# Patient Record
Sex: Female | Born: 1950 | Race: White | Hispanic: No | Smoking: Current every day smoker
Health system: Southern US, Community
[De-identification: ages and names within clinical notes are randomized; demographics above are authoritative.]

## PROBLEM LIST (undated history)

## (undated) DIAGNOSIS — E119 Type 2 diabetes mellitus without complications: Secondary | ICD-10-CM

## (undated) DIAGNOSIS — F419 Anxiety disorder, unspecified: Secondary | ICD-10-CM

## (undated) DIAGNOSIS — F329 Major depressive disorder, single episode, unspecified: Secondary | ICD-10-CM

## (undated) DIAGNOSIS — J449 Chronic obstructive pulmonary disease, unspecified: Secondary | ICD-10-CM

## (undated) DIAGNOSIS — F32A Depression, unspecified: Secondary | ICD-10-CM

## (undated) DIAGNOSIS — B029 Zoster without complications: Secondary | ICD-10-CM

## (undated) DIAGNOSIS — C569 Malignant neoplasm of unspecified ovary: Secondary | ICD-10-CM

## (undated) DIAGNOSIS — E785 Hyperlipidemia, unspecified: Secondary | ICD-10-CM

## (undated) DIAGNOSIS — K219 Gastro-esophageal reflux disease without esophagitis: Secondary | ICD-10-CM

## (undated) DIAGNOSIS — G629 Polyneuropathy, unspecified: Secondary | ICD-10-CM

## (undated) DIAGNOSIS — E079 Disorder of thyroid, unspecified: Secondary | ICD-10-CM

## (undated) DIAGNOSIS — E039 Hypothyroidism, unspecified: Secondary | ICD-10-CM

## (undated) DIAGNOSIS — I10 Essential (primary) hypertension: Secondary | ICD-10-CM

## (undated) DIAGNOSIS — J40 Bronchitis, not specified as acute or chronic: Secondary | ICD-10-CM

## (undated) DIAGNOSIS — R0600 Dyspnea, unspecified: Secondary | ICD-10-CM

## (undated) HISTORY — PX: CHOLECYSTECTOMY: SHX55

## (undated) HISTORY — DX: Type 2 diabetes mellitus without complications: E11.9

## (undated) HISTORY — DX: Zoster without complications: B02.9

## (undated) HISTORY — DX: Depression, unspecified: F32.A

## (undated) HISTORY — DX: Polyneuropathy, unspecified: G62.9

## (undated) HISTORY — DX: Malignant neoplasm of unspecified ovary: C56.9

## (undated) HISTORY — DX: Bronchitis, not specified as acute or chronic: J40

## (undated) HISTORY — DX: Chronic obstructive pulmonary disease, unspecified: J44.9

## (undated) HISTORY — PX: ANKLE SURGERY: SHX546

## (undated) HISTORY — DX: Major depressive disorder, single episode, unspecified: F32.9

## (undated) HISTORY — PX: TONSILLECTOMY: SHX5217

## (undated) HISTORY — PX: SMALL INTESTINE SURGERY: SHX150

## (undated) HISTORY — PX: OVARY SURGERY: SHX727

## (undated) HISTORY — PX: ABDOMINAL HYSTERECTOMY: SHX81

## (undated) HISTORY — DX: Anxiety disorder, unspecified: F41.9

---

## 2004-02-24 ENCOUNTER — Ambulatory Visit: Payer: Self-pay | Admitting: Oncology

## 2004-03-06 ENCOUNTER — Emergency Department: Payer: Self-pay | Admitting: General Practice

## 2004-03-26 ENCOUNTER — Ambulatory Visit: Payer: Self-pay | Admitting: Oncology

## 2004-05-01 ENCOUNTER — Ambulatory Visit: Payer: Self-pay | Admitting: Oncology

## 2004-05-15 ENCOUNTER — Ambulatory Visit: Payer: Self-pay | Admitting: Oncology

## 2004-05-26 ENCOUNTER — Ambulatory Visit: Payer: Self-pay | Admitting: Oncology

## 2004-06-26 ENCOUNTER — Ambulatory Visit: Payer: Self-pay | Admitting: Oncology

## 2004-07-24 ENCOUNTER — Ambulatory Visit: Payer: Self-pay | Admitting: Oncology

## 2004-08-24 ENCOUNTER — Ambulatory Visit: Payer: Self-pay | Admitting: Oncology

## 2004-09-23 ENCOUNTER — Ambulatory Visit: Payer: Self-pay | Admitting: Oncology

## 2004-10-07 ENCOUNTER — Emergency Department: Payer: Self-pay | Admitting: Internal Medicine

## 2004-10-24 ENCOUNTER — Ambulatory Visit: Payer: Self-pay | Admitting: Oncology

## 2004-12-23 ENCOUNTER — Ambulatory Visit: Payer: Self-pay | Admitting: Oncology

## 2005-01-29 ENCOUNTER — Ambulatory Visit: Payer: Self-pay | Admitting: Oncology

## 2005-02-28 ENCOUNTER — Ambulatory Visit: Payer: Self-pay | Admitting: Oncology

## 2005-03-26 ENCOUNTER — Ambulatory Visit: Payer: Self-pay | Admitting: Oncology

## 2005-04-25 ENCOUNTER — Ambulatory Visit: Payer: Self-pay | Admitting: Oncology

## 2005-06-06 ENCOUNTER — Other Ambulatory Visit: Payer: Self-pay

## 2005-06-06 ENCOUNTER — Ambulatory Visit: Payer: Self-pay | Admitting: Unknown Physician Specialty

## 2005-06-11 ENCOUNTER — Ambulatory Visit: Payer: Self-pay | Admitting: Oncology

## 2005-06-12 ENCOUNTER — Ambulatory Visit: Payer: Self-pay | Admitting: Unknown Physician Specialty

## 2005-06-19 ENCOUNTER — Ambulatory Visit: Payer: Self-pay | Admitting: Unknown Physician Specialty

## 2005-06-26 ENCOUNTER — Ambulatory Visit: Payer: Self-pay | Admitting: Oncology

## 2005-07-31 ENCOUNTER — Ambulatory Visit: Payer: Self-pay | Admitting: Oncology

## 2005-08-24 ENCOUNTER — Ambulatory Visit: Payer: Self-pay | Admitting: Oncology

## 2005-09-02 ENCOUNTER — Ambulatory Visit: Payer: Self-pay | Admitting: Internal Medicine

## 2005-12-01 ENCOUNTER — Ambulatory Visit: Payer: Self-pay | Admitting: Oncology

## 2005-12-24 ENCOUNTER — Ambulatory Visit: Payer: Self-pay | Admitting: Oncology

## 2006-01-13 ENCOUNTER — Ambulatory Visit: Payer: Self-pay | Admitting: Unknown Physician Specialty

## 2006-01-15 ENCOUNTER — Ambulatory Visit: Payer: Self-pay | Admitting: Unknown Physician Specialty

## 2006-04-08 ENCOUNTER — Ambulatory Visit: Payer: Self-pay | Admitting: Oncology

## 2006-04-10 ENCOUNTER — Ambulatory Visit: Payer: Self-pay | Admitting: Oncology

## 2006-04-25 ENCOUNTER — Ambulatory Visit: Payer: Self-pay | Admitting: Oncology

## 2006-07-25 ENCOUNTER — Ambulatory Visit: Payer: Self-pay | Admitting: Oncology

## 2006-08-10 ENCOUNTER — Ambulatory Visit: Payer: Self-pay | Admitting: Oncology

## 2006-08-25 ENCOUNTER — Ambulatory Visit: Payer: Self-pay | Admitting: Oncology

## 2006-10-07 ENCOUNTER — Ambulatory Visit: Payer: Self-pay | Admitting: Gastroenterology

## 2006-11-16 ENCOUNTER — Ambulatory Visit: Payer: Self-pay | Admitting: Internal Medicine

## 2006-11-24 ENCOUNTER — Ambulatory Visit: Payer: Self-pay | Admitting: Oncology

## 2006-12-07 ENCOUNTER — Ambulatory Visit: Payer: Self-pay | Admitting: Oncology

## 2006-12-25 ENCOUNTER — Ambulatory Visit: Payer: Self-pay | Admitting: Oncology

## 2007-04-26 ENCOUNTER — Ambulatory Visit: Payer: Self-pay | Admitting: Oncology

## 2007-05-06 ENCOUNTER — Ambulatory Visit: Payer: Self-pay | Admitting: Oncology

## 2007-05-27 ENCOUNTER — Ambulatory Visit: Payer: Self-pay | Admitting: Oncology

## 2007-11-16 ENCOUNTER — Ambulatory Visit: Payer: Self-pay | Admitting: Oncology

## 2007-11-17 ENCOUNTER — Ambulatory Visit: Payer: Self-pay | Admitting: Oncology

## 2007-11-24 ENCOUNTER — Ambulatory Visit: Payer: Self-pay | Admitting: Oncology

## 2008-01-31 ENCOUNTER — Emergency Department: Payer: Self-pay | Admitting: Emergency Medicine

## 2008-04-25 ENCOUNTER — Ambulatory Visit: Payer: Self-pay | Admitting: Oncology

## 2008-05-11 ENCOUNTER — Ambulatory Visit: Payer: Self-pay | Admitting: Oncology

## 2008-05-26 ENCOUNTER — Ambulatory Visit: Payer: Self-pay | Admitting: Oncology

## 2008-10-24 ENCOUNTER — Ambulatory Visit: Payer: Self-pay | Admitting: Oncology

## 2008-11-08 ENCOUNTER — Ambulatory Visit: Payer: Self-pay | Admitting: Oncology

## 2008-11-23 ENCOUNTER — Ambulatory Visit: Payer: Self-pay | Admitting: Oncology

## 2009-01-16 ENCOUNTER — Ambulatory Visit: Payer: Self-pay | Admitting: Oncology

## 2009-04-25 ENCOUNTER — Ambulatory Visit: Payer: Self-pay | Admitting: Oncology

## 2009-05-14 ENCOUNTER — Ambulatory Visit: Payer: Self-pay | Admitting: Oncology

## 2009-05-26 ENCOUNTER — Ambulatory Visit: Payer: Self-pay | Admitting: Oncology

## 2009-11-21 ENCOUNTER — Ambulatory Visit: Payer: Self-pay | Admitting: Oncology

## 2009-11-23 ENCOUNTER — Ambulatory Visit: Payer: Self-pay | Admitting: Oncology

## 2010-05-15 ENCOUNTER — Ambulatory Visit: Payer: Self-pay | Admitting: Oncology

## 2010-05-23 ENCOUNTER — Ambulatory Visit: Payer: Self-pay | Admitting: Oncology

## 2010-05-25 LAB — CA 125: CA 125: 17.3 U/mL (ref 0.0–34.0)

## 2010-05-26 ENCOUNTER — Ambulatory Visit: Payer: Self-pay | Admitting: Oncology

## 2010-09-10 ENCOUNTER — Inpatient Hospital Stay: Payer: Self-pay | Admitting: Internal Medicine

## 2010-09-30 ENCOUNTER — Emergency Department: Payer: Self-pay | Admitting: Emergency Medicine

## 2010-12-03 ENCOUNTER — Ambulatory Visit: Payer: Self-pay | Admitting: Oncology

## 2010-12-25 ENCOUNTER — Ambulatory Visit: Payer: Self-pay | Admitting: Oncology

## 2011-03-11 ENCOUNTER — Inpatient Hospital Stay: Payer: Self-pay | Admitting: Surgery

## 2011-03-14 LAB — PATHOLOGY REPORT

## 2011-12-09 ENCOUNTER — Ambulatory Visit: Payer: Self-pay | Admitting: Oncology

## 2011-12-09 LAB — CBC CANCER CENTER
Basophil #: 0.1 x10 3/mm (ref 0.0–0.1)
Eosinophil #: 0.3 x10 3/mm (ref 0.0–0.7)
Eosinophil %: 2.9 %
HCT: 42.4 % (ref 35.0–47.0)
Lymphocyte #: 2.3 x10 3/mm (ref 1.0–3.6)
Lymphocyte %: 20.9 %
MCV: 99 fL (ref 80–100)
Monocyte #: 0.5 x10 3/mm (ref 0.2–0.9)
Monocyte %: 5 %
Neutrophil %: 70.3 %
Platelet: 240 x10 3/mm (ref 150–440)
RBC: 4.29 10*6/uL (ref 3.80–5.20)
WBC: 10.9 x10 3/mm (ref 3.6–11.0)

## 2011-12-09 LAB — COMPREHENSIVE METABOLIC PANEL
Albumin: 3.6 g/dL (ref 3.4–5.0)
Anion Gap: 14 (ref 7–16)
Calcium, Total: 9.1 mg/dL (ref 8.5–10.1)
Chloride: 97 mmol/L — ABNORMAL LOW (ref 98–107)
Co2: 28 mmol/L (ref 21–32)
EGFR (African American): 56 — ABNORMAL LOW
EGFR (Non-African Amer.): 48 — ABNORMAL LOW
Glucose: 145 mg/dL — ABNORMAL HIGH (ref 65–99)
Potassium: 3.7 mmol/L (ref 3.5–5.1)
SGOT(AST): 32 U/L (ref 15–37)
SGPT (ALT): 27 U/L

## 2011-12-25 ENCOUNTER — Ambulatory Visit: Payer: Self-pay | Admitting: Oncology

## 2012-01-11 ENCOUNTER — Emergency Department: Payer: Self-pay | Admitting: *Deleted

## 2012-01-11 LAB — CBC
HCT: 41 % (ref 35.0–47.0)
HGB: 13.5 g/dL (ref 12.0–16.0)
MCH: 32 pg (ref 26.0–34.0)
MCV: 97 fL (ref 80–100)
RBC: 4.22 10*6/uL (ref 3.80–5.20)
WBC: 12 10*3/uL — ABNORMAL HIGH (ref 3.6–11.0)

## 2012-01-11 LAB — CK TOTAL AND CKMB (NOT AT ARMC): CK-MB: 2.6 ng/mL (ref 0.5–3.6)

## 2012-01-11 LAB — BASIC METABOLIC PANEL
BUN: 17 mg/dL (ref 7–18)
Calcium, Total: 9.1 mg/dL (ref 8.5–10.1)
Chloride: 99 mmol/L (ref 98–107)
Co2: 32 mmol/L (ref 21–32)
Glucose: 148 mg/dL — ABNORMAL HIGH (ref 65–99)

## 2012-01-11 LAB — TROPONIN I: Troponin-I: <0.02 ng/mL

## 2012-07-28 ENCOUNTER — Ambulatory Visit: Payer: Self-pay | Admitting: Internal Medicine

## 2012-11-12 IMAGING — CR DG CHEST 2V
1 series · 2 of 2 positions shown · non-contrast
Comparison: none

REASON FOR EXAM: chest pain
COMMENTS:

[Series 1: w chest pa · 0.14mm/px · 2 of 2 slices shown]
[im 1/2]
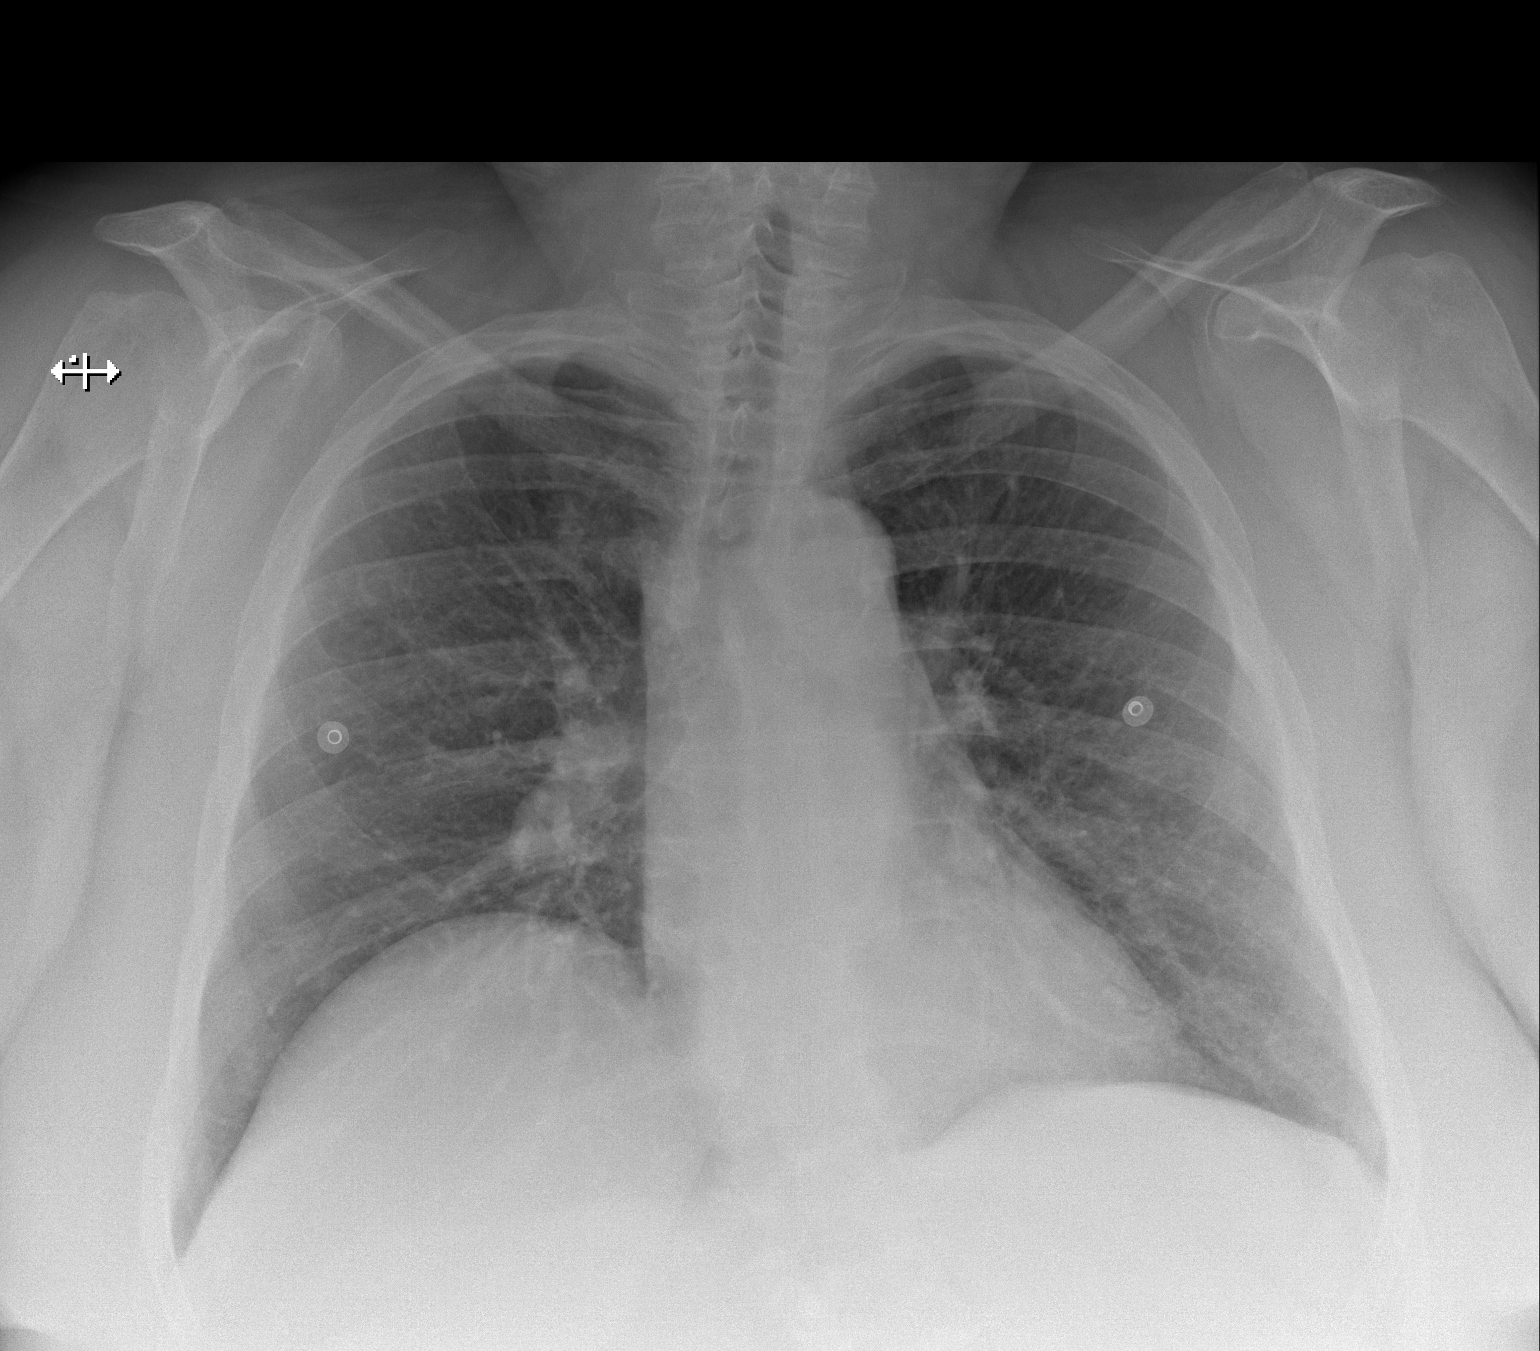
[im 2/2]
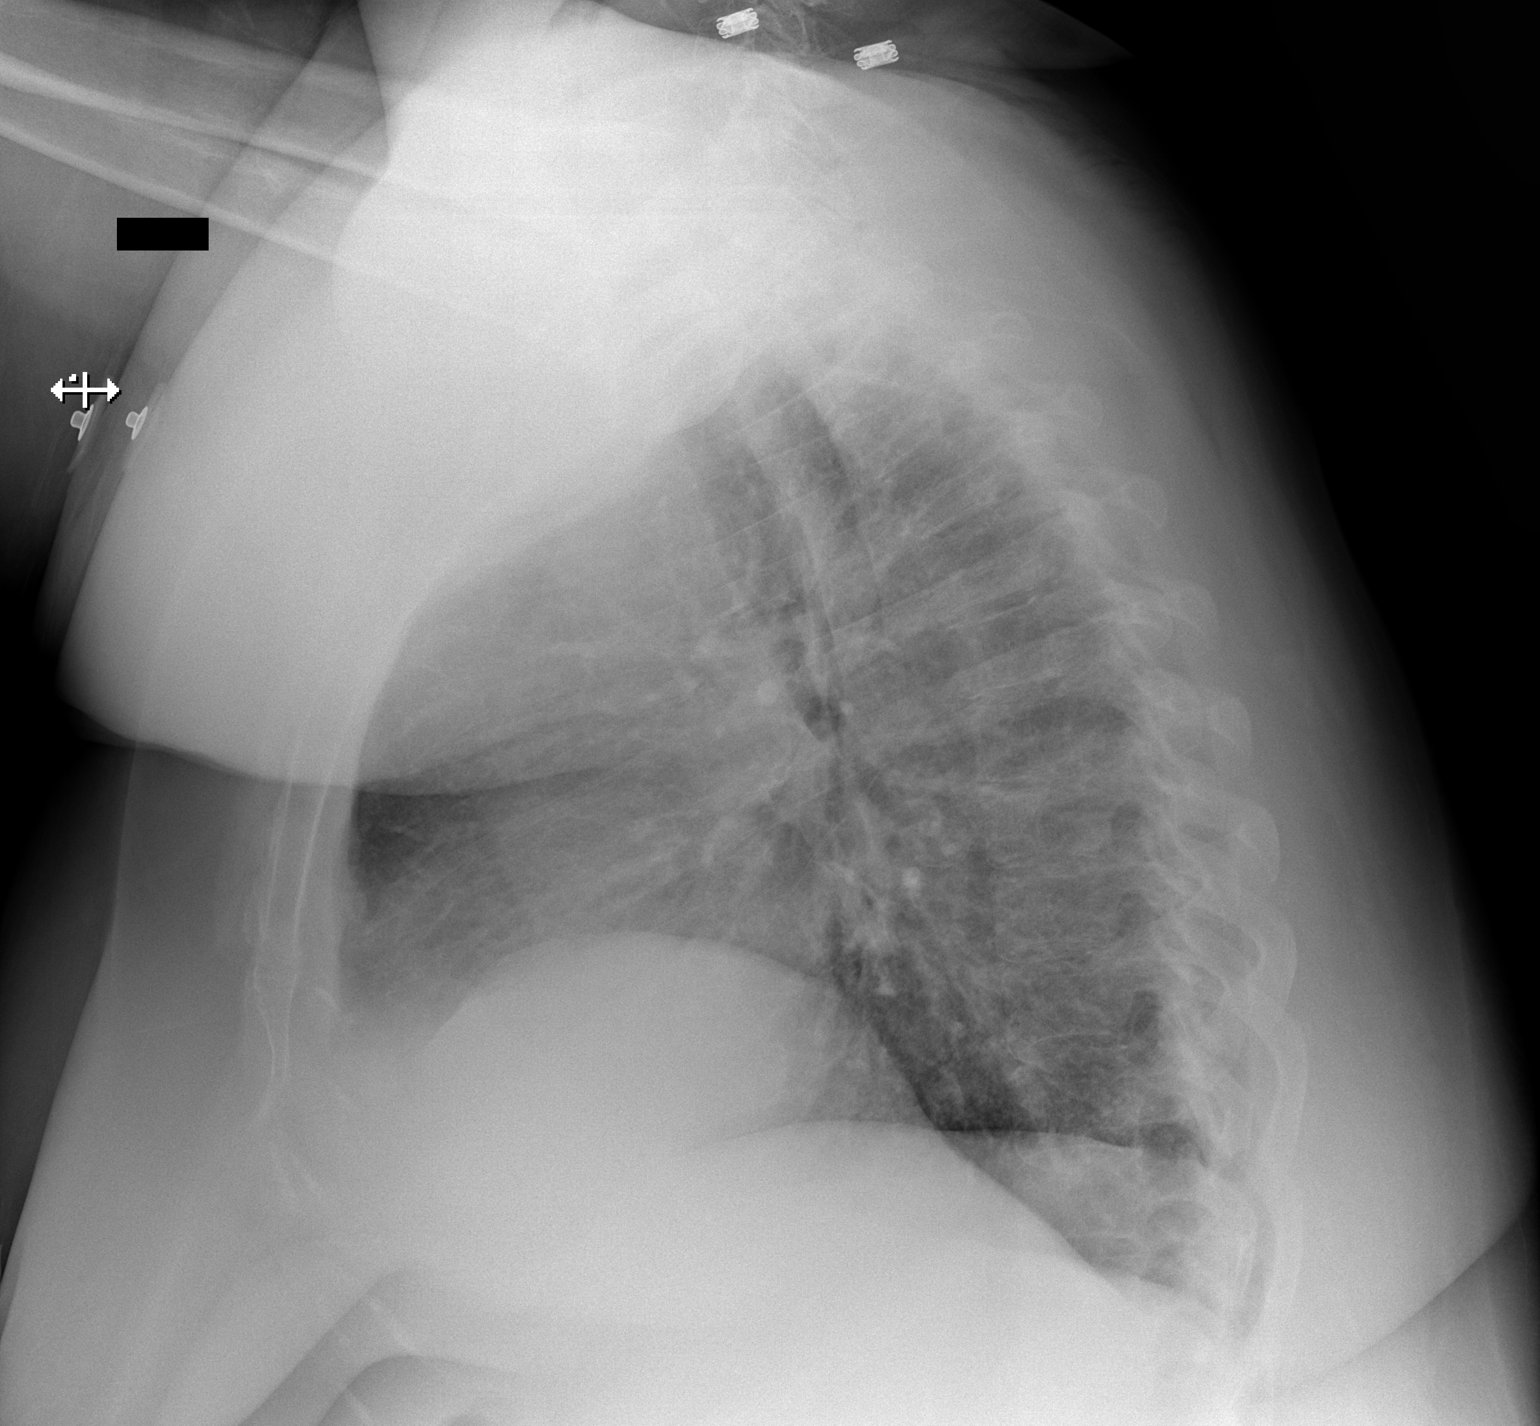

[2 of 2 positions shown; findings below may reference images not displayed]

PROCEDURE:     DXR - DXR CHEST PA (OR AP) AND LATERAL  - January 11, 2012  [DATE]

RESULT:     The lungs are reasonably well inflated. There is no focal
infiltrate. The cardiac silhouette is normal in size. The pulmonary
vascularity is not engorged. There is no pleural effusion. The mediastinum
is normal in width. The observed portions of the bony thorax exhibit no
acute abnormality.
IMPRESSION: There is no evidence of acute cardiopulmonary abnormality.

[REDACTED]

## 2014-02-26 ENCOUNTER — Emergency Department: Payer: Self-pay | Admitting: Emergency Medicine

## 2014-02-26 LAB — COMPREHENSIVE METABOLIC PANEL
ALBUMIN: 3.5 g/dL (ref 3.4–5.0)
ALK PHOS: 84 U/L
ALT: 26 U/L
ANION GAP: 6 — AB (ref 7–16)
AST: 32 U/L (ref 15–37)
BUN: 13 mg/dL (ref 7–18)
Bilirubin,Total: 0.5 mg/dL (ref 0.2–1.0)
CREATININE: 0.9 mg/dL (ref 0.60–1.30)
Calcium, Total: 9 mg/dL (ref 8.5–10.1)
Chloride: 100 mmol/L (ref 98–107)
Co2: 32 mmol/L (ref 21–32)
EGFR (African American): >60
EGFR (Non-African Amer.): >60
Glucose: 150 mg/dL — ABNORMAL HIGH (ref 65–99)
Osmolality: 279 (ref 275–301)
POTASSIUM: 4.1 mmol/L (ref 3.5–5.1)
SODIUM: 138 mmol/L (ref 136–145)
Total Protein: 8.1 g/dL (ref 6.4–8.2)

## 2014-02-26 LAB — CBC
HCT: 42.8 % (ref 35.0–47.0)
HGB: 13.8 g/dL (ref 12.0–16.0)
MCH: 31.6 pg (ref 26.0–34.0)
MCHC: 32.2 g/dL (ref 32.0–36.0)
MCV: 98 fL (ref 80–100)
PLATELETS: 269 10*3/uL (ref 150–440)
RBC: 4.35 10*6/uL (ref 3.80–5.20)
RDW: 14 % (ref 11.5–14.5)
WBC: 10.7 10*3/uL (ref 3.6–11.0)

## 2014-03-01 ENCOUNTER — Encounter: Payer: Self-pay | Admitting: Surgery

## 2014-03-03 LAB — CULTURE, BLOOD (SINGLE)

## 2014-11-16 ENCOUNTER — Ambulatory Visit: Payer: PPO | Admitting: Psychiatry

## 2014-11-28 ENCOUNTER — Other Ambulatory Visit: Payer: Self-pay

## 2014-11-28 ENCOUNTER — Other Ambulatory Visit: Payer: Self-pay | Admitting: Psychiatry

## 2014-11-28 ENCOUNTER — Ambulatory Visit: Payer: PPO | Admitting: Psychiatry

## 2014-11-28 DIAGNOSIS — Z87898 Personal history of other specified conditions: Secondary | ICD-10-CM | POA: Insufficient documentation

## 2014-11-28 DIAGNOSIS — F411 Generalized anxiety disorder: Secondary | ICD-10-CM | POA: Insufficient documentation

## 2014-11-28 DIAGNOSIS — Z8679 Personal history of other diseases of the circulatory system: Secondary | ICD-10-CM | POA: Insufficient documentation

## 2014-11-28 DIAGNOSIS — Z8639 Personal history of other endocrine, nutritional and metabolic disease: Secondary | ICD-10-CM | POA: Insufficient documentation

## 2014-11-28 DIAGNOSIS — G629 Polyneuropathy, unspecified: Secondary | ICD-10-CM | POA: Insufficient documentation

## 2014-11-28 DIAGNOSIS — F331 Major depressive disorder, recurrent, moderate: Secondary | ICD-10-CM | POA: Insufficient documentation

## 2014-11-28 DIAGNOSIS — J449 Chronic obstructive pulmonary disease, unspecified: Secondary | ICD-10-CM | POA: Insufficient documentation

## 2014-11-28 MED ORDER — ALPRAZOLAM 0.5 MG PO TABS: 0.5000 mg | ORAL_TABLET | Freq: Three times a day (TID) | ORAL | Status: DC | PRN

## 2014-11-28 MED ORDER — LAMOTRIGINE 25 MG PO TABS: 25.0000 mg | ORAL_TABLET | Freq: Every day | ORAL | Status: DC

## 2014-11-28 MED ORDER — ALPRAZOLAM 0.5 MG PO TABS
0.5000 mg | ORAL_TABLET | Freq: Every evening | ORAL | Status: DC | PRN
Start: 2014-11-28 — End: 2014-11-28

## 2014-11-28 MED ORDER — PAROXETINE HCL 20 MG PO TABS: ORAL_TABLET | ORAL | Status: DC

## 2014-12-19 ENCOUNTER — Encounter: Payer: Self-pay | Admitting: Psychiatry

## 2014-12-19 ENCOUNTER — Ambulatory Visit (INDEPENDENT_AMBULATORY_CARE_PROVIDER_SITE_OTHER): Payer: PPO | Admitting: Psychiatry

## 2014-12-19 VITALS — BP 162/98 | HR 107 | Temp 98.8°F | Ht 63.0 in

## 2014-12-19 DIAGNOSIS — F411 Generalized anxiety disorder: Secondary | ICD-10-CM | POA: Diagnosis not present

## 2014-12-19 DIAGNOSIS — F3130 Bipolar disorder, current episode depressed, mild or moderate severity, unspecified: Secondary | ICD-10-CM | POA: Diagnosis not present

## 2014-12-19 MED ORDER — ALPRAZOLAM 0.5 MG PO TABS: 0.5000 mg | ORAL_TABLET | Freq: Three times a day (TID) | ORAL | Status: DC | PRN

## 2014-12-19 MED ORDER — PAROXETINE HCL 20 MG PO TABS: ORAL_TABLET | ORAL | Status: DC

## 2014-12-19 NOTE — Progress Notes (Signed)
BH MD/PA/NP OP Progress Note  12/19/2014 11:10 AM Glenda Boone  MRN:  [id number]  Subjective:  Patient is a morbidly obese female who presented for the follow-up appointment. She reported that she was unable to keep her appointment as her grandson and did not bring her on time. She was very apologetic for the same. She reported that she was very sad as her cat off 12 years passed away in 18-Nov-2022. She reported that her grandchildren are now trying to keep her chair for by bringing her and her daily. Patient reported that she also fell last month and is recuperating from the same. Patient reported that she is tired of being obese and does not want to walk anymore as she feels depressed as most of the time. She reported that she has been following with her primary care physician and is going to discuss with them about the option of having a gastric sleeve. She is compliant with taking Paxil and Xanax at this time. She denied having any suicidal ideations or plans.   Chief Complaint:  Chief Complaint    Follow-up; Anxiety; Depression     Visit Diagnosis:     ICD-9-CM ICD-10-CM   1. Bipolar I disorder, most recent episode (or current) unspecified 296.7 F31.9   2. Generalized anxiety disorder 300.02 F41.1     Past Medical History: No past medical history on file. No past surgical history on file. Family History: No family history on file. Social History:  History   Social History  . Marital Status: Widowed    Spouse Name: N/A  . Number of Children: N/A  . Years of Education: N/A   Social History Main Topics  . Smoking status: Not on file  . Smokeless tobacco: Not on file  . Alcohol Use: Not on file  . Drug Use: Not on file  . Sexual Activity: Not on file   Other Topics Concern  . Not on file   Social History Narrative  . No narrative on file   Additional History:   She currently lives with her family members.    Assessment:   Musculoskeletal: Strength & Muscle Tone:  decreased Gait & Station: unsteady, walking slowly  Patient leans: N/A  Psychiatric Specialty Exam: HPI  Review of Systems  Cardiovascular: Positive for orthopnea and PND.  Musculoskeletal: Positive for back pain and neck pain.  Neurological: Positive for dizziness and weakness.  Psychiatric/Behavioral: The patient is nervous/anxious.   All other systems reviewed and are negative.   There were no vitals taken for this visit.There is no height or weight on file to calculate BMI.  General Appearance: Casual  Eye Contact:  Fair  Speech:  Slow  Volume:  Decreased  Mood:  Anxious and Depressed  Affect:  Congruent  Thought Process:  Coherent  Orientation:  Full (Time, Place, and Person)  Thought Content:  WDL  Suicidal Thoughts:  No  Homicidal Thoughts:  No  Memory:  NA  Judgement:  Fair  Insight:  Fair  Psychomotor Activity:  Normal  Concentration:  Fair  Recall:  [employer] of Knowledge: Fair  Language: Fair  Akathisia:  No  Handed:  Right  AIMS (if indicated):    Assets:  Communication Skills Desire for Improvement Social Support  ADL's:  Intact  Cognition: WNL  Sleep:     Is the patient at risk to self?  No. Has the patient been a risk to self in the past 6 months?  No. Has the patient  been a risk to self within the distant past?  No. Is the patient a risk to others?  No. Has the patient been a risk to others in the past 6 months?  No. Has the patient been a risk to others within the distant past?  No.  Current Medications: Current Outpatient Prescriptions  Medication Sig Dispense Refill  . ALPRAZolam (XANAX) 0.5 MG tablet Take 1 tablet (0.5 mg total) by mouth 3 (three) times daily as needed for anxiety. 90 tablet 0  . atorvastatin (LIPITOR) 40 MG tablet Take by mouth.    . hydrochlorothiazide (MICROZIDE) 12.5 MG capsule Take by mouth.    . lamoTRIgine (LAMICTAL) 25 MG tablet Take by mouth.    . lamoTRIgine (LAMICTAL) 25 MG tablet Take 1 tablet (25 mg total) by  mouth daily. 30 tablet 0  . levothyroxine (SYNTHROID) 100 MCG tablet Take by mouth.    . metFORMIN (GLUCOPHAGE) 500 MG tablet Take by mouth.    Marland Kitchen PARoxetine (PAXIL) 20 MG tablet 3 pill daily 90 tablet 0  . potassium chloride (K-DUR) 10 MEQ tablet Take by mouth.    . simvastatin (ZOCOR) 40 MG tablet Take by mouth.    . torsemide (DEMADEX) 10 MG tablet Take by mouth.     No current facility-administered medications for this visit.    Medical Decision Making:  Review of Psycho-Social Stressors (1) and Review and summation of old records (2)  Treatment Plan Summary:Medication management   Discussed with patient what the medications treatment risks benefits and alternatives. Encouraged patient to seek help about her obesity and to get gastric sleeve done and she demonstrated understanding.  e will continue on Paxil 60 mg by mouth daily.  She also takes Xanax 3 times a day when necessary.  She will follow-up in 4 months or earlier    More than 50% of the time spent in psychoeducation, counseling and coordination of care.    This note was generated in part or whole with voice recognition software. Voice regonition is usually quite accurate but there are transcription errors that can and very often do occur. I apologize for any typographical errors that were not detected and corrected.   Rainey Pines 12/19/2014, 11:10 AM

## 2015-04-09 ENCOUNTER — Ambulatory Visit: Payer: Self-pay | Admitting: Psychiatry

## 2015-04-24 ENCOUNTER — Ambulatory Visit: Payer: Self-pay | Admitting: Psychiatry

## 2015-04-29 ENCOUNTER — Other Ambulatory Visit: Payer: Self-pay | Admitting: Psychiatry

## 2015-05-02 ENCOUNTER — Telehealth: Payer: Self-pay | Admitting: Psychiatry

## 2015-05-03 ENCOUNTER — Ambulatory Visit: Payer: PPO | Admitting: Psychiatry

## 2015-05-31 MED ORDER — ALPRAZOLAM 0.5 MG PO TABS: 0.5000 mg | ORAL_TABLET | Freq: Three times a day (TID) | ORAL | Status: DC | PRN

## 2015-05-31 MED ORDER — PAROXETINE HCL 20 MG PO TABS: 20.0000 mg | ORAL_TABLET | Freq: Three times a day (TID) | ORAL | Status: DC

## 2015-05-31 NOTE — Telephone Encounter (Signed)
pt needs medication refill on paxil and xanax

## 2015-05-31 NOTE — Telephone Encounter (Signed)
rx faxed - confirm for xanax .5mg  take 1 tablet by mouth 3 times daily as needed.  id # [id number] order [id number]

## 2015-05-31 NOTE — Telephone Encounter (Signed)
Pt continues to cancel her appointments and asking for medication refills. Advised her that this will be the last refill before she will be dismissed from the practice, if she will not be able to continue with her appointments. She demonstrated understanding.

## 2015-06-21 ENCOUNTER — Ambulatory Visit (INDEPENDENT_AMBULATORY_CARE_PROVIDER_SITE_OTHER): Payer: PPO | Admitting: Psychiatry

## 2015-06-21 ENCOUNTER — Encounter: Payer: Self-pay | Admitting: Psychiatry

## 2015-06-21 VITALS — BP 142/98 | HR 84 | Temp 97.6°F | Ht 63.0 in

## 2015-06-21 DIAGNOSIS — J418 Mixed simple and mucopurulent chronic bronchitis: Secondary | ICD-10-CM

## 2015-06-21 DIAGNOSIS — F3130 Bipolar disorder, current episode depressed, mild or moderate severity, unspecified: Secondary | ICD-10-CM

## 2015-06-21 DIAGNOSIS — B029 Zoster without complications: Secondary | ICD-10-CM | POA: Insufficient documentation

## 2015-06-21 DIAGNOSIS — F411 Generalized anxiety disorder: Secondary | ICD-10-CM

## 2015-06-21 DIAGNOSIS — J42 Unspecified chronic bronchitis: Secondary | ICD-10-CM | POA: Insufficient documentation

## 2015-06-21 HISTORY — DX: Zoster without complications: B02.9

## 2015-06-21 MED ORDER — ALPRAZOLAM 0.5 MG PO TABS: 0.5000 mg | ORAL_TABLET | Freq: Two times a day (BID) | ORAL | Status: DC | PRN

## 2015-06-21 MED ORDER — PAROXETINE HCL 20 MG PO TABS: 20.0000 mg | ORAL_TABLET | Freq: Three times a day (TID) | ORAL | Status: DC

## 2015-06-21 NOTE — Progress Notes (Signed)
[place] MD/PA/NP OP Progress Note  06/21/2015 10:38 AM Glenda Boone  MRN:  [id number]  Subjective:  Patient is a morbidly obese female who presented for the follow-up appointment. She was last seen in July. Patient reported that she was sick as she was suffering from chronic bronchitis and had shingles. She was apologetic for her missing appointments. Patient reported that she is doing better now. She was discussing in detail about her issues with her family members as well as with her grandsons. She reported that she is compliant with her medications. She takes Xanax on a when necessary basis and is usually taking on a twice-daily basis. She also takes Paxil 60 mg in the morning. She reported that she is doing better on her current doses of the medications. She is currently stable on her medications and denied having any more swings anger anxiety or paranoia. She is moderately obese and is walking with the help of walker. She denied having any suicidal ideations or plans. She continues to smoke on a regular basis.    Chief Complaint:  Chief Complaint    Follow-up; Medication Refill     Visit Diagnosis:     ICD-9-CM ICD-10-CM   1. MDD, moderate 296.50 F31.30   2. Generalized anxiety disorder 300.02 F41.1   3. Shingles outbreak 053.9 B02.9   4. Mixed simple and mucopurulent chronic bronchitis (HCC) 491.1 J41.8     Past Medical History:  Past Medical History  Diagnosis Date  . Ovarian cancer ([place])   . Anxiety   . Depression   . Diabetes mellitus, type II ([place])   . COPD (chronic obstructive pulmonary disease) ([place])   . Neuropathy ([place])   . Shingles   . Bronchitis     Past Surgical History  Procedure Laterality Date  . Abdominal hysterectomy    . Ovary surgery    . Cholecystectomy    . Tonsillectomy     Family History:  Family History  Problem Relation Age of Onset  . Heart Problems Mother   . Hypertension Mother   . Colon cancer Father   . Arthritis-Osteo Sister    Social  History:  Social History   Social History  . Marital Status: Widowed    Spouse Name: N/A  . Number of Children: N/A  . Years of Education: N/A   Social History Main Topics  . Smoking status: Current Every Day Smoker -- 1.00 packs/day    Types: Cigarettes    Start date: 12/19/1979  . Smokeless tobacco: Never Used  . Alcohol Use: No  . Drug Use: No  . Sexual Activity: No   Other Topics Concern  . None   Social History Narrative   Additional History:   She currently lives by herself. Family members help her.     Assessment:   Musculoskeletal: Strength & Muscle Tone: decreased Gait & Station: unsteady, walking slowly  Patient leans: N/A  Psychiatric Specialty Exam: HPI   Review of Systems  Constitutional: Positive for malaise/fatigue.  Cardiovascular: Positive for leg swelling.  Gastrointestinal: Positive for diarrhea.  Musculoskeletal: Positive for back pain, joint pain and neck pain.  Neurological: Positive for dizziness and weakness.  Psychiatric/Behavioral: Positive for depression. The patient is nervous/anxious.   All other systems reviewed and are negative.   Blood pressure 142/98, pulse 84, temperature 97.6 F (36.4 C), temperature source Tympanic, height 5\' 3"  (1.6 m), SpO2 94 %.There is no weight on file to calculate BMI.  General Appearance: Casual  Eye Contact:  Fair  Speech:  Slow  Volume:  Decreased  Mood:  Euthymic  Affect:  Congruent  Thought Process:  Coherent  Orientation:  Full (Time, Place, and Person)  Thought Content:  WDL  Suicidal Thoughts:  No  Homicidal Thoughts:  No  Memory:  NA  Judgement:  Fair  Insight:  Fair  Psychomotor Activity:  Normal  Concentration:  Fair  Recall:  [employer] of Knowledge: Fair  Language: Fair  Akathisia:  No  Handed:  Right  AIMS (if indicated):    Assets:  Communication Skills Desire for Improvement Social Support  ADL's:  Intact  Cognition: WNL  Sleep:     Is the patient at risk to self?   No. Has the patient been a risk to self in the past 6 months?  No. Has the patient been a risk to self within the distant past?  No. Is the patient a risk to others?  No. Has the patient been a risk to others in the past 6 months?  No. Has the patient been a risk to others within the distant past?  No.  Current Medications: Current Outpatient Prescriptions  Medication Sig Dispense Refill  . ALPRAZolam (XANAX) 0.5 MG tablet Take 1 tablet (0.5 mg total) by mouth 3 (three) times daily as needed for anxiety. 90 tablet 0  . atorvastatin (LIPITOR) 40 MG tablet Take by mouth.    . hydrochlorothiazide (MICROZIDE) 12.5 MG capsule Take by mouth.    . levothyroxine (SYNTHROID) 100 MCG tablet Take by mouth.    Marland Kitchen lisinopril (PRINIVIL,ZESTRIL) 20 MG tablet Take 20 mg by mouth.    . metFORMIN (GLUCOPHAGE) 500 MG tablet Take by mouth.    Marland Kitchen PARoxetine (PAXIL) 20 MG tablet Take 1 tablet (20 mg total) by mouth 3 (three) times daily. 90 tablet 0  . potassium chloride (K-DUR) 10 MEQ tablet Take by mouth.    . simvastatin (ZOCOR) 40 MG tablet Take by mouth.    . torsemide (DEMADEX) 10 MG tablet Take by mouth.    . triamcinolone (KENALOG) 0.025 % cream Reported on 06/21/2015     No current facility-administered medications for this visit.    Medical Decision Making:  Review of Psycho-Social Stressors (1) and Review and summation of old records (2)  Treatment Plan Summary:Medication management   Discussed with patient what the medications treatment risks benefits and alternatives. Encouraged patient to seek help about her obesity and to get gastric sleeve done and she demonstrated understanding.  She will continue on Paxil 60 mg by mouth daily.- 90 day supply of the medications given  She also takes Xanax .05 mg BID when necessary. - 3 month supply of the medications given She will follow-up in 3 months or earlier    More than 50% of the time spent in psychoeducation, counseling and coordination of care.     This note was generated in part or whole with voice recognition software. Voice regonition is usually quite accurate but there are transcription errors that can and very often do occur. I apologize for any typographical errors that were not detected and corrected.   Rainey Pines, MD    06/21/2015, 10:38 AM

## 2015-08-28 DIAGNOSIS — L989 Disorder of the skin and subcutaneous tissue, unspecified: Secondary | ICD-10-CM | POA: Diagnosis not present

## 2015-08-28 DIAGNOSIS — Z79899 Other long term (current) drug therapy: Secondary | ICD-10-CM | POA: Diagnosis not present

## 2015-08-28 DIAGNOSIS — E039 Hypothyroidism, unspecified: Secondary | ICD-10-CM | POA: Diagnosis not present

## 2015-08-28 DIAGNOSIS — Z1211 Encounter for screening for malignant neoplasm of colon: Secondary | ICD-10-CM | POA: Diagnosis not present

## 2015-08-28 DIAGNOSIS — R7309 Other abnormal glucose: Secondary | ICD-10-CM | POA: Diagnosis not present

## 2015-08-28 DIAGNOSIS — L309 Dermatitis, unspecified: Secondary | ICD-10-CM | POA: Diagnosis not present

## 2015-08-28 DIAGNOSIS — J449 Chronic obstructive pulmonary disease, unspecified: Secondary | ICD-10-CM | POA: Diagnosis not present

## 2015-08-28 DIAGNOSIS — I10 Essential (primary) hypertension: Secondary | ICD-10-CM | POA: Diagnosis not present

## 2015-08-28 DIAGNOSIS — E119 Type 2 diabetes mellitus without complications: Secondary | ICD-10-CM | POA: Diagnosis not present

## 2015-08-28 DIAGNOSIS — F17200 Nicotine dependence, unspecified, uncomplicated: Secondary | ICD-10-CM | POA: Diagnosis not present

## 2015-08-29 DIAGNOSIS — Z1211 Encounter for screening for malignant neoplasm of colon: Secondary | ICD-10-CM | POA: Diagnosis not present

## 2015-09-05 ENCOUNTER — Other Ambulatory Visit: Payer: Self-pay | Admitting: Family Medicine

## 2015-09-05 DIAGNOSIS — Z1231 Encounter for screening mammogram for malignant neoplasm of breast: Secondary | ICD-10-CM

## 2015-09-18 ENCOUNTER — Ambulatory Visit: Payer: PPO | Admitting: Psychiatry

## 2015-09-24 ENCOUNTER — Encounter: Payer: Self-pay | Admitting: Psychiatry

## 2015-09-24 ENCOUNTER — Ambulatory Visit (INDEPENDENT_AMBULATORY_CARE_PROVIDER_SITE_OTHER): Payer: PPO | Admitting: Psychiatry

## 2015-09-24 VITALS — BP 142/78 | HR 90 | Temp 97.7°F

## 2015-09-24 DIAGNOSIS — F411 Generalized anxiety disorder: Secondary | ICD-10-CM | POA: Diagnosis not present

## 2015-09-24 DIAGNOSIS — F3130 Bipolar disorder, current episode depressed, mild or moderate severity, unspecified: Secondary | ICD-10-CM

## 2015-09-24 MED ORDER — ALPRAZOLAM 0.5 MG PO TABS: 0.5000 mg | ORAL_TABLET | Freq: Two times a day (BID) | ORAL | Status: DC | PRN

## 2015-09-24 MED ORDER — PAROXETINE HCL 20 MG PO TABS: 20.0000 mg | ORAL_TABLET | Freq: Three times a day (TID) | ORAL | Status: DC

## 2015-09-24 NOTE — Progress Notes (Signed)
BH MD/PA/NP OP Progress Note  09/24/2015 12:14 PM Glenda Boone  MRN:  [id number]  Subjective:  Patient is a morbidly obese 65 yo  female who presented for the follow-up appointment.  Patient reported that she had her colonoscopy done last week. She reported that she also felt in her house and was taken to the hospital. She was noted to be talking nonstop during the interview and was discussing her issues at this time. She reported that she is unhappy as she is getting a monthly check for her SSI. She has to ration her income. She is being supported by her family as well as her sister. Her sister brought her for this appointment. Patient reported that she takes Paxil and Xanax on a regular basis. She currently denied having any symptoms at this time. She denied having any suicidal homicidal ideations or plans. She continues to smoke on a regular basis.    Chief Complaint:  Chief Complaint    Follow-up; Medication Refill     Visit Diagnosis:     ICD-9-CM ICD-10-CM   1. MDD, moderate 296.50 F31.30   2. Generalized anxiety disorder 300.02 F41.1     Past Medical History:  Past Medical History  Diagnosis Date  . Ovarian cancer ([place])   . Anxiety   . Depression   . Diabetes mellitus, type II ([place])   . COPD (chronic obstructive pulmonary disease) ([place])   . Neuropathy ([place])   . Shingles   . Bronchitis     Past Surgical History  Procedure Laterality Date  . Abdominal hysterectomy    . Ovary surgery    . Cholecystectomy    . Tonsillectomy     Family History:  Family History  Problem Relation Age of Onset  . Heart Problems Mother   . Hypertension Mother   . Colon cancer Father   . Arthritis-Osteo Sister    Social History:  Social History   Social History  . Marital Status: Widowed    Spouse Name: N/A  . Number of Children: N/A  . Years of Education: N/A   Social History Main Topics  . Smoking status: Current Every Day Smoker -- 1.00 packs/day    Types: Cigarettes   Start date: 12/19/1979  . Smokeless tobacco: Never Used  . Alcohol Use: No  . Drug Use: No  . Sexual Activity: No   Other Topics Concern  . None   Social History Narrative   Additional History:   She currently lives by herself. Family members help her.     Assessment:   Musculoskeletal: Strength & Muscle Tone: decreased Gait & Station: unsteady, walking slowly  Patient leans: N/A  Psychiatric Specialty Exam: HPI  Review of Systems  Constitutional: Positive for malaise/fatigue.  Cardiovascular: Positive for leg swelling.  Gastrointestinal: Positive for diarrhea.  Musculoskeletal: Positive for back pain, joint pain and neck pain.  Neurological: Positive for dizziness and weakness.  Psychiatric/Behavioral: Positive for depression. The patient is nervous/anxious.   All other systems reviewed and are negative.   Blood pressure 142/78, pulse 90, temperature 97.7 F (36.5 C), temperature source Tympanic, SpO2 95 %.There is no weight on file to calculate BMI.  General Appearance: Casual  Eye Contact:  Fair  Speech:  Slow  Volume:  Decreased  Mood:  Euthymic  Affect:  Congruent  Thought Process:  Coherent  Orientation:  Full (Time, Place, and Person)  Thought Content:  WDL  Suicidal Thoughts:  No  Homicidal Thoughts:  No  Memory:  NA  Judgement:  Fair  Insight:  Fair  Psychomotor Activity:  Normal  Concentration:  Fair  Recall:  [employer] of Knowledge: Fair  Language: Fair  Akathisia:  No  Handed:  Right  AIMS (if indicated):    Assets:  Communication Skills Desire for Improvement Social Support  ADL's:  Intact  Cognition: WNL  Sleep:     Is the patient at risk to self?  No. Has the patient been a risk to self in the past 6 months?  No. Has the patient been a risk to self within the distant past?  No. Is the patient a risk to others?  No. Has the patient been a risk to others in the past 6 months?  No. Has the patient been a risk to others within the  distant past?  No.  Current Medications: Current Outpatient Prescriptions  Medication Sig Dispense Refill  . ALPRAZolam (XANAX) 0.5 MG tablet Take 1 tablet (0.5 mg total) by mouth 2 (two) times daily as needed for anxiety. 60 tablet 2  . atorvastatin (LIPITOR) 40 MG tablet Take by mouth.    . hydrochlorothiazide (MICROZIDE) 12.5 MG capsule Take by mouth.    . levothyroxine (SYNTHROID) 100 MCG tablet Take by mouth.    Marland Kitchen lisinopril (PRINIVIL,ZESTRIL) 20 MG tablet Take 20 mg by mouth.    . metFORMIN (GLUCOPHAGE) 500 MG tablet Take by mouth.    Marland Kitchen PARoxetine (PAXIL) 20 MG tablet Take 1 tablet (20 mg total) by mouth 3 (three) times daily. 270 tablet 2  . potassium chloride (K-DUR) 10 MEQ tablet Take by mouth.    . simvastatin (ZOCOR) 40 MG tablet Take by mouth.    . torsemide (DEMADEX) 10 MG tablet Take by mouth.    . triamcinolone (KENALOG) 0.025 % cream Reported on 06/21/2015     No current facility-administered medications for this visit.    Medical Decision Making:  Review of Psycho-Social Stressors (1) and Review and summation of old records (2)  Treatment Plan Summary:Medication management   Discussed with patient what the medications treatment risks benefits and alternatives.   She will continue on Paxil 60 mg by mouth daily.- 90 day supply of the medications given  She also takes Xanax 0.5 mg BID when necessary. - 3 month supply of the medications given She will follow-up in 3 months or earlier    More than 50% of the time spent in psychoeducation, counseling and coordination of care.    This note was generated in part or whole with voice recognition software. Voice regonition is usually quite accurate but there are transcription errors that can and very often do occur. I apologize for any typographical errors that were not detected and corrected.   Rainey Pines, MD    09/24/2015, 12:14 PM

## 2015-12-25 ENCOUNTER — Ambulatory Visit: Payer: PPO | Admitting: Psychiatry

## 2015-12-31 ENCOUNTER — Encounter: Payer: Self-pay | Admitting: Psychiatry

## 2015-12-31 ENCOUNTER — Ambulatory Visit (INDEPENDENT_AMBULATORY_CARE_PROVIDER_SITE_OTHER): Payer: PPO | Admitting: Psychiatry

## 2015-12-31 VITALS — BP 169/99 | HR 82 | Temp 98.3°F

## 2015-12-31 DIAGNOSIS — F411 Generalized anxiety disorder: Secondary | ICD-10-CM

## 2015-12-31 DIAGNOSIS — F3130 Bipolar disorder, current episode depressed, mild or moderate severity, unspecified: Secondary | ICD-10-CM

## 2015-12-31 MED ORDER — ALPRAZOLAM 0.5 MG PO TABS: 0.5000 mg | ORAL_TABLET | Freq: Two times a day (BID) | ORAL | 2 refills | Status: DC | PRN

## 2015-12-31 MED ORDER — PAROXETINE HCL 20 MG PO TABS: 60.0000 mg | ORAL_TABLET | Freq: Every day | ORAL | 2 refills | Status: DC

## 2015-12-31 NOTE — Progress Notes (Signed)
BH MD/PA/NP OP Progress Note  12/31/2015 12:13 PM Glenda Boone  MRN:  [id number]  Subjective:  Patient is a morbidly obese 65 yo  female who presented for the follow-up appointment.  Patient was breathless and unable to walk. She reported that she had stopped her "water pill",  She was noted to be talking nonstop during the interview and was discussing her issues. She reported that [facility] is [facility] issues [facility] as their [facility] to [facility] from her. Patient reported that she is also planning to find a scooter for herself. She is unable to walk for long distances. She appeared disheveled as usual and reported that she has been having issues due to her morbid obesity. She has support from her sister who brought her for the appointment. Patient reported that she takes Paxil in the morning and makes her tired and we discussed about changing it to bedtime. She is also taking Xanax on a twice-daily basis.  Patient currently denied having any suicidal homicidal ideations or plans. She denied having any perceptual disturbances.   Chief Complaint:  Chief Complaint    Follow-up; Medication Refill     Visit Diagnosis:     ICD-9-CM ICD-10-CM   1. MDD, moderate 296.50 F31.30   2. Generalized anxiety disorder 300.02 F41.1     Past Medical History:  Past Medical History:  Diagnosis Date  . Anxiety   . Bronchitis   . COPD (chronic obstructive pulmonary disease) ([place])   . Depression   . Diabetes mellitus, type II ([place])   . Neuropathy ([place])   . Ovarian cancer ([place])   . Shingles     Past Surgical History:  Procedure Laterality Date  . ABDOMINAL HYSTERECTOMY    . CHOLECYSTECTOMY    . OVARY SURGERY    . TONSILLECTOMY     Family History:  Family History  Problem Relation Age of Onset  . Heart Problems Mother   . Hypertension Mother   . Colon cancer Father   . Arthritis-Osteo Sister    Social History:  Social History   Social History  . Marital status: Widowed    Spouse name:  N/A  . Number of children: N/A  . Years of education: N/A   Social History Main Topics  . Smoking status: Current Every Day Smoker    Packs/day: 1.00    Types: Cigarettes    Start date: 12/19/1979  . Smokeless tobacco: Never Used  . Alcohol use No  . Drug use: No  . Sexual activity: No   Other Topics Concern  . None   Social History Narrative  . None   Additional History:   She currently lives by herself. Family members help her.     Assessment:   Musculoskeletal: Strength & Muscle Tone: decreased Gait & Station: unsteady, walking slowly  Patient leans: N/A  Psychiatric Specialty Exam: Medication Refill  Associated symptoms include neck pain and weakness.    Review of Systems  Constitutional: Positive for malaise/fatigue.  Cardiovascular: Positive for leg swelling.  Gastrointestinal: Positive for diarrhea.  Musculoskeletal: Positive for back pain, joint pain and neck pain.  Neurological: Positive for dizziness and weakness.  Psychiatric/Behavioral: Positive for depression. The patient is nervous/anxious.   All other systems reviewed and are negative.   Blood pressure (!) 169/99, pulse 82, temperature 98.3 F (36.8 C), temperature source Oral.There is no height or weight on file to calculate BMI.  General Appearance: Casual  Eye Contact:  Fair  Speech:  Clear  and Coherent  Volume:  Normal  Mood:  Euthymic  Affect:  Congruent  Thought Process:  Coherent  Orientation:  Full (Time, Place, and Person)  Thought Content:  WDL  Suicidal Thoughts:  No  Homicidal Thoughts:  No  Memory:  NA  Judgement:  Fair  Insight:  Fair  Psychomotor Activity:  Normal  Concentration:  Fair  Recall:  [employer] of Knowledge: Fair  Language: Fair  Akathisia:  No  Handed:  Right  AIMS (if indicated):    Assets:  Communication Skills Desire for Improvement Social Support  ADL's:  Intact  Cognition: WNL  Sleep:     Is the patient at risk to self?  No. Has the patient  been a risk to self in the past 6 months?  No. Has the patient been a risk to self within the distant past?  No. Is the patient a risk to others?  No. Has the patient been a risk to others in the past 6 months?  No. Has the patient been a risk to others within the distant past?  No.  Current Medications: Current Outpatient Prescriptions  Medication Sig Dispense Refill  . ALPRAZolam (XANAX) 0.5 MG tablet Take 1 tablet (0.5 mg total) by mouth 2 (two) times daily as needed for anxiety. 60 tablet 2  . atorvastatin (LIPITOR) 40 MG tablet Take by mouth.    . hydrochlorothiazide (MICROZIDE) 12.5 MG capsule Take by mouth.    . levothyroxine (SYNTHROID) 100 MCG tablet Take by mouth.    Marland Kitchen lisinopril (PRINIVIL,ZESTRIL) 20 MG tablet Take 20 mg by mouth.    . metFORMIN (GLUCOPHAGE) 500 MG tablet Take by mouth.    Marland Kitchen PARoxetine (PAXIL) 20 MG tablet Take 1 tablet (20 mg total) by mouth 3 (three) times daily. 270 tablet 2  . potassium chloride (K-DUR) 10 MEQ tablet Take by mouth.    . simvastatin (ZOCOR) 40 MG tablet Take by mouth.    . torsemide (DEMADEX) 10 MG tablet Take by mouth.    . triamcinolone (KENALOG) 0.025 % cream Reported on 06/21/2015     No current facility-administered medications for this visit.     Medical Decision Making:  Review of Psycho-Social Stressors (1) and Review and summation of old records (2)  Treatment Plan Summary:Medication management   Discussed with patient what the medications treatment risks benefits and alternatives.   She will continue on Paxil 60 mg by mouth daily.- 90 day supply of the medications given  She also takes Xanax 0.5 mg BID when necessary. - 3 month supply of the medications given She will follow-up in 3 months or earlier    More than 50% of the time spent in psychoeducation, counseling and coordination of care.    This note was generated in part or whole with voice recognition software. Voice regonition is usually quite accurate but there are  transcription errors that can and very often do occur. I apologize for any typographical errors that were not detected and corrected.   Rainey Pines, MD    12/31/2015, 12:13 PM

## 2016-04-01 ENCOUNTER — Telehealth: Payer: Self-pay | Admitting: *Deleted

## 2016-04-01 DIAGNOSIS — F411 Generalized anxiety disorder: Secondary | ICD-10-CM

## 2016-04-01 DIAGNOSIS — F331 Major depressive disorder, recurrent, moderate: Secondary | ICD-10-CM

## 2016-04-01 NOTE — Telephone Encounter (Signed)
Pt called requesting refills or enough doses of her Paxil and Alprazolam to make it to the next appointment on 04/08/16. Currently has at least 3 days of both. Will forward to Dr. Gretel Acre to address upon return tomorrow

## 2016-04-02 ENCOUNTER — Ambulatory Visit: Payer: PPO | Admitting: Psychiatry

## 2016-04-03 MED ORDER — ALPRAZOLAM 0.5 MG PO TABS: 0.5000 mg | ORAL_TABLET | Freq: Two times a day (BID) | ORAL | 0 refills | Status: DC | PRN

## 2016-04-03 MED ORDER — PAROXETINE HCL 20 MG PO TABS: 60.0000 mg | ORAL_TABLET | Freq: Every day | ORAL | 0 refills | Status: DC

## 2016-04-03 NOTE — Telephone Encounter (Signed)
Medication refill - Called in one time refill order for patient's Alprazolam with [name], pharmacist at [facility] on [address] and e-scribed in approved one time refill order for patient's Paxil per approval by Dr. Gretel Acre.  Called patient and left a message to inform both orders were sent to her pharmacy and patient to call back if any problems filling orders.  Reminded of appointment for 04/08/16 on message left.

## 2016-04-03 NOTE — Telephone Encounter (Signed)
Refill- one month supply. Thanks

## 2016-04-03 NOTE — Telephone Encounter (Signed)
Medication refill - Patient left another message she is now out of Paxil and will also be running out of Xanax prior to appointment scheduled 04/08/16.  Requests new orders be called in to last until appointment to her [place].  Patient last provided orders 12/31/15 plus 2 refills for both.

## 2016-04-08 ENCOUNTER — Encounter: Payer: Self-pay | Admitting: Psychiatry

## 2016-04-08 ENCOUNTER — Ambulatory Visit (INDEPENDENT_AMBULATORY_CARE_PROVIDER_SITE_OTHER): Payer: PPO | Admitting: Psychiatry

## 2016-04-08 VITALS — BP 152/93 | HR 83 | Temp 97.6°F | Resp 18

## 2016-04-08 DIAGNOSIS — F331 Major depressive disorder, recurrent, moderate: Secondary | ICD-10-CM

## 2016-04-08 DIAGNOSIS — F411 Generalized anxiety disorder: Secondary | ICD-10-CM

## 2016-04-08 MED ORDER — ALPRAZOLAM 0.5 MG PO TABS: 0.5000 mg | ORAL_TABLET | Freq: Two times a day (BID) | ORAL | 2 refills | Status: DC | PRN

## 2016-04-08 MED ORDER — PAROXETINE HCL 20 MG PO TABS: 60.0000 mg | ORAL_TABLET | Freq: Every day | ORAL | 2 refills | Status: DC
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
## 2016-06-16 ENCOUNTER — Telehealth: Payer: Self-pay

## 2016-06-16 NOTE — Telephone Encounter (Signed)
Thank you :)
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
## 2016-06-16 NOTE — Telephone Encounter (Signed)
called pharamcy they states they would note reason why patient wants medication earily  and go ahead an refill rx early.

## 2016-07-01 DIAGNOSIS — R32 Unspecified urinary incontinence: Secondary | ICD-10-CM | POA: Diagnosis not present

## 2016-07-01 DIAGNOSIS — E039 Hypothyroidism, unspecified: Secondary | ICD-10-CM | POA: Diagnosis not present

## 2016-07-01 DIAGNOSIS — R7309 Other abnormal glucose: Secondary | ICD-10-CM | POA: Diagnosis not present

## 2016-07-01 DIAGNOSIS — Z1159 Encounter for screening for other viral diseases: Secondary | ICD-10-CM | POA: Diagnosis not present

## 2016-07-01 DIAGNOSIS — Z79899 Other long term (current) drug therapy: Secondary | ICD-10-CM | POA: Diagnosis not present

## 2016-07-01 DIAGNOSIS — E119 Type 2 diabetes mellitus without complications: Secondary | ICD-10-CM | POA: Diagnosis not present

## 2016-07-01 DIAGNOSIS — F17200 Nicotine dependence, unspecified, uncomplicated: Secondary | ICD-10-CM | POA: Diagnosis not present

## 2016-07-01 DIAGNOSIS — I10 Essential (primary) hypertension: Secondary | ICD-10-CM | POA: Diagnosis not present

## 2016-07-09 ENCOUNTER — Ambulatory Visit (INDEPENDENT_AMBULATORY_CARE_PROVIDER_SITE_OTHER): Payer: PPO | Admitting: Psychiatry

## 2016-07-09 ENCOUNTER — Encounter: Payer: Self-pay | Admitting: Psychiatry

## 2016-07-09 VITALS — BP 168/109 | HR 85 | Temp 98.3°F

## 2016-07-09 DIAGNOSIS — F411 Generalized anxiety disorder: Secondary | ICD-10-CM

## 2016-07-09 DIAGNOSIS — F331 Major depressive disorder, recurrent, moderate: Secondary | ICD-10-CM

## 2016-07-09 MED ORDER — PAROXETINE HCL 20 MG PO TABS: 60.0000 mg | ORAL_TABLET | Freq: Every day | ORAL | 2 refills | Status: DC

## 2016-07-09 MED ORDER — HYDROXYZINE HCL 25 MG PO TABS: 25.0000 mg | ORAL_TABLET | Freq: Two times a day (BID) | ORAL | 2 refills | Status: DC
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
## 2016-08-25 ENCOUNTER — Telehealth: Payer: Self-pay

## 2016-08-25 NOTE — Telephone Encounter (Signed)
pt called states she can not take the medication you put her on .  it is making her light headed and sleepy.  pt states she wants to go back on the xanax.

## 2016-08-27 NOTE — Telephone Encounter (Signed)
pt  called again to check on her phone message. pt state she not able to take the medication you put her on and that she wants to go back on the xanax.

## 2016-08-28 NOTE — Telephone Encounter (Signed)
pt called again wanted to know what she needed to do. was dr. Gretel Acre going to put her back on xanax or call in something else for her to try.  pt was told that dr. Gretel Acre didn't make any statment and that i would have to forward message to  office to see if a doctor there would answer message.    Pt was last seen on  07-09-16 next appt 10-06-16

## 2016-08-29 NOTE — Telephone Encounter (Signed)
[place] office wanted me to see if you would advise on this patient.

## 2016-08-29 NOTE — Telephone Encounter (Signed)
Message was left on voice mail to call our office back.

## 2016-08-29 NOTE — Telephone Encounter (Signed)
At this time if patient has been tapered off the Xanax in February she will need to follow up with Dr. Gretel Acre came in May.

## 2016-08-29 NOTE — Telephone Encounter (Signed)
received a fax this morning where pt called and reached the answering services.  according to message received from answering service, pt stated that she is out of xanax and that she was taken off of it but the medication that dr. Gretel Acre put her on is not working and making her worse so she wants to go back on the xanax.    Pt was seen on  07-09-16 next appt  10-06-16

## 2016-09-26 ENCOUNTER — Telehealth: Payer: Self-pay

## 2016-09-26 NOTE — Telephone Encounter (Signed)
PT CALLED STATES SHE CAN NOT TAKE THE HYDROXYZINE.  SHE HAS STOPPED TAKING .  SHE WASNT TO GO BACK ON XANAX.  PT STATE THE ONLY THING THAT IS KEEPING HER SAIN IS THE PAXIL.

## 2016-10-06 ENCOUNTER — Ambulatory Visit: Payer: PPO | Admitting: Psychiatry

## 2016-10-06 NOTE — Telephone Encounter (Signed)
pt called again states she needs to go back on the xanax.  states she is having panic attacks and and she doesnt know what to do.

## 2016-10-13 ENCOUNTER — Ambulatory Visit (INDEPENDENT_AMBULATORY_CARE_PROVIDER_SITE_OTHER): Payer: PPO | Admitting: Psychiatry

## 2016-10-13 ENCOUNTER — Encounter: Payer: Self-pay | Admitting: Psychiatry

## 2016-10-13 VITALS — BP 160/82 | HR 84 | Temp 98.4°F

## 2016-10-13 DIAGNOSIS — F411 Generalized anxiety disorder: Secondary | ICD-10-CM

## 2016-10-13 DIAGNOSIS — F331 Major depressive disorder, recurrent, moderate: Secondary | ICD-10-CM | POA: Diagnosis not present

## 2016-10-13 MED ORDER — PAROXETINE HCL 20 MG PO TABS: 60.0000 mg | ORAL_TABLET | Freq: Every day | ORAL | 2 refills | Status: DC

## 2016-10-13 MED ORDER — GABAPENTIN 100 MG PO CAPS: 100.0000 mg | ORAL_CAPSULE | Freq: Three times a day (TID) | ORAL | 2 refills | Status: DC
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
## 2016-10-16 ENCOUNTER — Other Ambulatory Visit: Payer: Self-pay | Admitting: Family Medicine

## 2016-10-16 DIAGNOSIS — Z1231 Encounter for screening mammogram for malignant neoplasm of breast: Secondary | ICD-10-CM

## 2016-11-13 NOTE — Telephone Encounter (Signed)
pt was given an appt for 10-13-16 because Dr. Gretel Acre has not responded to any of the messages. pt very upset.

## 2016-12-22 ENCOUNTER — Ambulatory Visit
Admission: RE | Admit: 2016-12-22 | Discharge: 2016-12-22 | Disposition: A | Discharge status: Home / Self Care | Payer: PPO | Source: Ambulatory Visit | Attending: Family Medicine | Admitting: Family Medicine

## 2016-12-22 DIAGNOSIS — Z1231 Encounter for screening mammogram for malignant neoplasm of breast: Secondary | ICD-10-CM | POA: Insufficient documentation

## 2017-01-12 ENCOUNTER — Ambulatory Visit (INDEPENDENT_AMBULATORY_CARE_PROVIDER_SITE_OTHER): Payer: PPO | Admitting: Psychiatry

## 2017-01-12 DIAGNOSIS — F331 Major depressive disorder, recurrent, moderate: Secondary | ICD-10-CM

## 2017-01-12 DIAGNOSIS — F411 Generalized anxiety disorder: Secondary | ICD-10-CM | POA: Diagnosis not present

## 2017-01-12 MED ORDER — PAROXETINE HCL 20 MG PO TABS: 60.0000 mg | ORAL_TABLET | Freq: Every day | ORAL | 2 refills | Status: DC

## 2017-01-12 MED ORDER — GABAPENTIN 300 MG PO CAPS: 300.0000 mg | ORAL_CAPSULE | Freq: Every day | ORAL | 3 refills | Status: DC

## 2017-01-12 NOTE — Progress Notes (Signed)
BH MD/PA/NP OP Progress Note  01/12/2017 3:03 PM Glenda Boone  MRN:  [id number]  Subjective:  Patient is a morbidly obese 66 yo female who presented for the follow-up appointment.  Patient reported that she has started taking the Neurontin and it has been doing well for her. She reported that she is able to sleep well at night. She was talking about the pain and the neuropathy. She appeared calm and alert during the interview. No new issues noted. She was also asking about the Klonopin briefly but patient was advised that she will not be given any benzodiazepines at this time. She acknowledged.    She is in the wheelchair and cannot walk. Her sister brought her to the appointment.       Chief Complaint:   Visit Diagnosis:     ICD-10-CM   1. MDD (major depressive disorder), recurrent episode, moderate (HCC) F33.1 PARoxetine (PAXIL) 20 MG tablet  2. Generalized anxiety disorder F41.1     Past Medical History:  Past Medical History:  Diagnosis Date  . Anxiety   . Bronchitis   . COPD (chronic obstructive pulmonary disease) ([place])   . Depression   . Diabetes mellitus, type II ([place])   . Neuropathy   . Ovarian cancer ([place])   . Shingles     Past Surgical History:  Procedure Laterality Date  . ABDOMINAL HYSTERECTOMY    . CHOLECYSTECTOMY    . OVARY SURGERY    . TONSILLECTOMY     Family History:  Family History  Problem Relation Age of Onset  . Heart Problems Mother   . Hypertension Mother   . Colon cancer Father   . Arthritis-Osteo Sister    Social History:  Social History   Social History  . Marital status: Widowed    Spouse name: N/A  . Number of children: N/A  . Years of education: N/A   Social History Main Topics  . Smoking status: Current Every Day Smoker    Packs/day: 1.00    Types: Cigarettes    Start date: 12/19/1979  . Smokeless tobacco: Never Used  . Alcohol use No  . Drug use: No  . Sexual activity: No   Other Topics Concern  . Not on file    Social History Narrative  . No narrative on file   Additional History:   She currently lives by herself. Family members help her.     Assessment:   Musculoskeletal: Strength & Muscle Tone: decreased Gait & Station: unsteady, walking slowly  Patient leans: N/A  Psychiatric Specialty Exam: Medication Refill  Associated symptoms include neck pain and weakness.    Review of Systems  Constitutional: Positive for malaise/fatigue.  Cardiovascular: Positive for leg swelling.  Gastrointestinal: Positive for diarrhea.  Musculoskeletal: Positive for back pain, joint pain and neck pain.  Neurological: Positive for dizziness and weakness.  Psychiatric/Behavioral: Positive for depression. The patient is nervous/anxious.   All other systems reviewed and are negative.   There were no vitals taken for this visit.There is no height or weight on file to calculate BMI.  General Appearance: Casual  Eye Contact:  Fair  Speech:  Clear and Coherent  Volume:  Normal  Mood:  Euthymic  Affect:  Congruent  Thought Process:  Coherent  Orientation:  Full (Time, Place, and Person)  Thought Content:  WDL  Suicidal Thoughts:  No  Homicidal Thoughts:  No  Memory:  NA  Judgement:  Fair  Insight:  Fair  Psychomotor Activity:  Normal  Concentration:  Fair  Recall:  [employer] of Knowledge: Fair  Language: Fair  Akathisia:  No  Handed:  Right  AIMS (if indicated):    Assets:  Communication Skills Desire for Improvement Social Support  ADL's:  Intact  Cognition: WNL  Sleep:     Is the patient at risk to self?  No. Has the patient been a risk to self in the past 6 months?  No. Has the patient been a risk to self within the distant past?  No. Is the patient a risk to others?  No. Has the patient been a risk to others in the past 6 months?  No. Has the patient been a risk to others within the distant past?  No.  Current Medications: Current Outpatient Prescriptions  Medication Sig  Dispense Refill  . atorvastatin (LIPITOR) 40 MG tablet Take by mouth.    . gabapentin (NEURONTIN) 300 MG capsule Take 1 capsule (300 mg total) by mouth at bedtime. 30 capsule 3  . hydrochlorothiazide (MICROZIDE) 12.5 MG capsule Take by mouth.    . levothyroxine (SYNTHROID) 100 MCG tablet Take by mouth.    Marland Kitchen lisinopril (PRINIVIL,ZESTRIL) 20 MG tablet Take 20 mg by mouth.    . metFORMIN (GLUCOPHAGE) 500 MG tablet Take by mouth.    Marland Kitchen PARoxetine (PAXIL) 20 MG tablet Take 3 tablets (60 mg total) by mouth at bedtime. 270 tablet 2  . potassium chloride (K-DUR) 10 MEQ tablet Take by mouth.    . simvastatin (ZOCOR) 40 MG tablet Take by mouth.    . torsemide (DEMADEX) 10 MG tablet Take by mouth.    . triamcinolone (KENALOG) 0.025 % cream Reported on 06/21/2015     No current facility-administered medications for this visit.     Medical Decision Making:  Review of Psycho-Social Stressors (1) and Review and summation of old records (2)  Treatment Plan Summary:Medication management   Discussed with patient what the medications treatment risks benefits and alternatives.   She will continue on Paxil 60 mg by mouth daily.- 90 day supply of the medications given I will start her on the Neurontin 300mg   Qhs.  Follow-up in 2 months or earlier depending on her symptoms   More than 50% of the time spent in psychoeducation, counseling and coordination of care.    This note was generated in part or whole with voice recognition software. Voice regonition is usually quite accurate but there are transcription errors that can and very often do occur. I apologize for any typographical errors that were not detected and corrected.   Rainey Pines, MD    01/12/2017, 3:03 PM

## 2017-04-27 ENCOUNTER — Ambulatory Visit: Payer: PPO | Admitting: Psychiatry

## 2017-05-12 DIAGNOSIS — R32 Unspecified urinary incontinence: Secondary | ICD-10-CM | POA: Diagnosis not present

## 2017-05-12 DIAGNOSIS — E039 Hypothyroidism, unspecified: Secondary | ICD-10-CM | POA: Diagnosis not present

## 2017-05-12 DIAGNOSIS — R7309 Other abnormal glucose: Secondary | ICD-10-CM | POA: Diagnosis not present

## 2017-05-12 DIAGNOSIS — E119 Type 2 diabetes mellitus without complications: Secondary | ICD-10-CM | POA: Diagnosis not present

## 2017-05-12 DIAGNOSIS — F17200 Nicotine dependence, unspecified, uncomplicated: Secondary | ICD-10-CM | POA: Diagnosis not present

## 2017-05-12 DIAGNOSIS — I10 Essential (primary) hypertension: Secondary | ICD-10-CM | POA: Diagnosis not present

## 2017-05-12 DIAGNOSIS — Z1211 Encounter for screening for malignant neoplasm of colon: Secondary | ICD-10-CM | POA: Diagnosis not present

## 2017-05-12 DIAGNOSIS — Z23 Encounter for immunization: Secondary | ICD-10-CM | POA: Diagnosis not present

## 2017-05-12 DIAGNOSIS — E1140 Type 2 diabetes mellitus with diabetic neuropathy, unspecified: Secondary | ICD-10-CM | POA: Diagnosis not present

## 2017-05-12 DIAGNOSIS — E876 Hypokalemia: Secondary | ICD-10-CM | POA: Diagnosis not present

## 2017-05-18 ENCOUNTER — Ambulatory Visit: Payer: PPO | Admitting: Psychiatry

## 2017-05-28 ENCOUNTER — Telehealth: Payer: Self-pay

## 2017-05-28 DIAGNOSIS — F331 Major depressive disorder, recurrent, moderate: Secondary | ICD-10-CM

## 2017-05-28 MED ORDER — GABAPENTIN 300 MG PO CAPS: 300.0000 mg | ORAL_CAPSULE | Freq: Every day | ORAL | 1 refills | Status: DC

## 2017-05-28 NOTE — Telephone Encounter (Signed)
pt called left message that she needs refills on her medications.    After looking at chart pt should be ok with the paxil but will need gabapentin.    Disp Refills Start End   gabapentin (NEURONTIN) 300 MG capsule 30 capsule 3 01/12/2017    Sig - Route: Take 1 capsule (300 mg total) by mouth at bedtime. - Oral   Sent to pharmacy as: gabapentin (NEURONTIN) 300 MG capsule   E-Prescribing Status: Receipt confirmed by pharmacy (01/12/2017 2:51 PM EDT)     Disp Refills Start End   PARoxetine (PAXIL) 20 MG tablet 270 tablet 2 01/12/2017    Sig - Route: Take 3 tablets (60 mg total) by mouth at bedtime. - Oral   Sent to pharmacy as: PARoxetine (PAXIL) 20 MG tablet   E-Prescribing Status: Receipt confirmed by pharmacy (01/12/2017 2:52 PM EDT)

## 2017-05-28 NOTE — Telephone Encounter (Signed)
Received request for gabapentin refill, will refill gabapentin 300 mg at bedtime- 30-day supply with 1 refill.

## 2017-06-29 ENCOUNTER — Ambulatory Visit: Payer: PPO | Admitting: Psychiatry

## 2017-07-20 ENCOUNTER — Ambulatory Visit (INDEPENDENT_AMBULATORY_CARE_PROVIDER_SITE_OTHER): Payer: Medicare HMO | Admitting: Psychiatry

## 2017-07-20 ENCOUNTER — Telehealth: Payer: Self-pay

## 2017-07-20 ENCOUNTER — Other Ambulatory Visit: Payer: Self-pay

## 2017-07-20 ENCOUNTER — Encounter: Payer: Self-pay | Admitting: Psychiatry

## 2017-07-20 VITALS — BP 177/107 | HR 83

## 2017-07-20 DIAGNOSIS — F411 Generalized anxiety disorder: Secondary | ICD-10-CM

## 2017-07-20 DIAGNOSIS — F331 Major depressive disorder, recurrent, moderate: Secondary | ICD-10-CM

## 2017-07-20 MED ORDER — PAROXETINE HCL 20 MG PO TABS: 60.0000 mg | ORAL_TABLET | Freq: Every day | ORAL | 2 refills | Status: DC

## 2017-07-20 MED ORDER — GABAPENTIN 300 MG PO CAPS: 300.0000 mg | ORAL_CAPSULE | Freq: Every day | ORAL | 1 refills | Status: DC

## 2017-07-20 NOTE — Telephone Encounter (Signed)
received a fax from pharmacy that insurance will not cover more thatn 1 tablet a day  Of the paroxetine 20mg  (take 3 tablets by mouth at bedtime)   do you want to try a different dosage.?

## 2017-07-20 NOTE — Telephone Encounter (Signed)
prior auth was done on [email or web address] pending outcome.

## 2017-07-20 NOTE — Telephone Encounter (Signed)
Do you want me to see if they can do a 40mg  and a 20mg 

## 2017-07-20 NOTE — Telephone Encounter (Signed)
Okay 

## 2017-07-20 NOTE — Telephone Encounter (Signed)
Per [facility] pt insurance expired 05-25-17.  Letter and note to pharmacy to check pt insurance was faxed and confirmed back to [employer].

## 2017-07-20 NOTE — Telephone Encounter (Signed)
Please CALL PT and ask her ? What she would like to do

## 2017-07-20 NOTE — Progress Notes (Signed)
BH MD/PA/NP OP Progress Note  07/20/2017 11:51 AM Glenda Boone  MRN:  [id number]  Subjective:  Patient is a morbidly obese 67 yo female who presented for the follow-up appointment. She was sitting in the wheelchair. She reported that she is currently having relationship issues with her daughter. Patient reported that she is having difficulty ambulating and she was talking in detail about how she was able to ambulate with her husband's scooter in the past however he has been deceased for the past 8-9 years. She is applying to get another scooter for herself with the help of her primary care physician. She reported that she wants to live in [place] or [place] area so she can ambulate around the stores. Her daughter wants to move to [facility]. She reported that she has been compliant with her medications. She received a refill on her medications and currently taking Paxil and Neurontin and they have been helping her. She currently denied having any suicidal homicidal ideations or plans. She denied having any perceptual disturbances at this time.    She is in the wheelchair and cannot walk.        Chief Complaint:  Chief Complaint    Follow-up; Medication Refill     Visit Diagnosis:     ICD-10-CM   1. MDD (major depressive disorder), recurrent episode, moderate (HCC) F33.1 gabapentin (NEURONTIN) 300 MG capsule    PARoxetine (PAXIL) 20 MG tablet  2. Generalized anxiety disorder F41.1     Past Medical History:  Past Medical History:  Diagnosis Date  . Anxiety   . Bronchitis   . COPD (chronic obstructive pulmonary disease) ([place])   . Depression   . Diabetes mellitus, type II ([place])   . Neuropathy   . Ovarian cancer ([place])   . Shingles     Past Surgical History:  Procedure Laterality Date  . ABDOMINAL HYSTERECTOMY    . CHOLECYSTECTOMY    . OVARY SURGERY    . TONSILLECTOMY     Family History:  Family History  Problem Relation Age of Onset  . Heart Problems Mother   . Hypertension  Mother   . Colon cancer Father   . Arthritis-Osteo Sister    Social History:  Social History   Socioeconomic History  . Marital status: Widowed    Spouse name: None  . Number of children: None  . Years of education: None  . Highest education level: None  Social Needs  . Financial resource strain: None  . Food insecurity - worry: None  . Food insecurity - inability: None  . Transportation needs - medical: None  . Transportation needs - non-medical: None  Occupational History  . None  Tobacco Use  . Smoking status: Current Every Day Smoker    Packs/day: 1.00    Types: Cigarettes    Start date: 12/19/1979  . Smokeless tobacco: Never Used  Substance and Sexual Activity  . Alcohol use: No    Alcohol/week: 0.0 oz  . Drug use: No  . Sexual activity: No  Other Topics Concern  . None  Social History Narrative  . None   Additional History:   She currently lives by herself. Family members help her.     Assessment:   Musculoskeletal: Strength & Muscle Tone: decreased Gait & Station: unsteady, walking slowly  Patient leans: N/A  Psychiatric Specialty Exam: Medication Refill  Associated symptoms include neck pain and weakness.    Review of Systems  Constitutional: Positive for malaise/fatigue.  Cardiovascular: Positive for  leg swelling.  Gastrointestinal: Positive for diarrhea.  Musculoskeletal: Positive for back pain, joint pain and neck pain.  Neurological: Positive for dizziness and weakness.  Psychiatric/Behavioral: Positive for depression. The patient is nervous/anxious.   All other systems reviewed and are negative.   Blood pressure (!) 177/107, pulse 83.There is no height or weight on file to calculate BMI.  General Appearance: Casual  Eye Contact:  Fair  Speech:  Clear and Coherent  Volume:  Normal  Mood:  Euthymic  Affect:  Congruent  Thought Process:  Coherent  Orientation:  Full (Time, Place, and Person)  Thought Content:  WDL  Suicidal Thoughts:   No  Homicidal Thoughts:  No  Memory:  NA  Judgement:  Fair  Insight:  Fair  Psychomotor Activity:  Normal  Concentration:  Fair  Recall:  [employer] of Knowledge: Fair  Language: Fair  Akathisia:  No  Handed:  Right  AIMS (if indicated):    Assets:  Communication Skills Desire for Improvement Social Support  ADL's:  Intact  Cognition: WNL  Sleep:     Is the patient at risk to self?  No. Has the patient been a risk to self in the past 6 months?  No. Has the patient been a risk to self within the distant past?  No. Is the patient a risk to others?  No. Has the patient been a risk to others in the past 6 months?  No. Has the patient been a risk to others within the distant past?  No.  Current Medications: Current Outpatient Medications  Medication Sig Dispense Refill  . atorvastatin (LIPITOR) 40 MG tablet Take by mouth.    . gabapentin (NEURONTIN) 300 MG capsule Take 1 capsule (300 mg total) by mouth at bedtime. 90 capsule 1  . hydrochlorothiazide (HYDRODIURIL) 25 MG tablet     . levothyroxine (SYNTHROID, LEVOTHROID) 200 MCG tablet     . levothyroxine (SYNTHROID, LEVOTHROID) 50 MCG tablet     . lisinopril (PRINIVIL,ZESTRIL) 20 MG tablet Take 20 mg by mouth.    . metFORMIN (GLUCOPHAGE) 500 MG tablet Take by mouth.    Marland Kitchen PARoxetine (PAXIL) 20 MG tablet Take 3 tablets (60 mg total) by mouth at bedtime. 270 tablet 2  . potassium chloride (K-DUR) 10 MEQ tablet Take by mouth.    . simvastatin (ZOCOR) 40 MG tablet Take by mouth.    . torsemide (DEMADEX) 10 MG tablet Take by mouth.    . triamcinolone (KENALOG) 0.025 % cream Reported on 06/21/2015     No current facility-administered medications for this visit.     Medical Decision Making:  Review of Psycho-Social Stressors (1) and Review and summation of old records (2)  Treatment Plan Summary:Medication management   Discussed with patient what the medications treatment risks benefits and alternatives.   She will continue on  Paxil 60 mg by mouth daily.- 90 day supply of the medications given  Neurontin 300mg   Qhs.  Follow-up in 3 months or earlier depending on her symptoms   More than 50% of the time spent in psychoeducation, counseling and coordination of care.    This note was generated in part or whole with voice recognition software. Voice regonition is usually quite accurate but there are transcription errors that can and very often do occur. I apologize for any typographical errors that were not detected and corrected.   Rainey Pines, MD    07/20/2017, 11:51 AM

## 2017-07-23 ENCOUNTER — Emergency Department
Admission: EM | Admit: 2017-07-23 | Discharge: 2017-07-23 | Disposition: A | Discharge status: Home / Self Care | Payer: Medicare HMO | Attending: Emergency Medicine | Admitting: Emergency Medicine

## 2017-07-23 ENCOUNTER — Encounter: Payer: Self-pay | Admitting: Emergency Medicine

## 2017-07-23 ENCOUNTER — Other Ambulatory Visit: Payer: Self-pay

## 2017-07-23 DIAGNOSIS — R531 Weakness: Secondary | ICD-10-CM | POA: Diagnosis present

## 2017-07-23 DIAGNOSIS — E119 Type 2 diabetes mellitus without complications: Secondary | ICD-10-CM | POA: Diagnosis not present

## 2017-07-23 DIAGNOSIS — E860 Dehydration: Secondary | ICD-10-CM | POA: Diagnosis not present

## 2017-07-23 DIAGNOSIS — Z7984 Long term (current) use of oral hypoglycemic drugs: Secondary | ICD-10-CM | POA: Insufficient documentation

## 2017-07-23 DIAGNOSIS — R42 Dizziness and giddiness: Secondary | ICD-10-CM | POA: Diagnosis not present

## 2017-07-23 DIAGNOSIS — F17210 Nicotine dependence, cigarettes, uncomplicated: Secondary | ICD-10-CM | POA: Insufficient documentation

## 2017-07-23 DIAGNOSIS — E039 Hypothyroidism, unspecified: Secondary | ICD-10-CM | POA: Diagnosis not present

## 2017-07-23 DIAGNOSIS — R5383 Other fatigue: Secondary | ICD-10-CM | POA: Insufficient documentation

## 2017-07-23 DIAGNOSIS — I10 Essential (primary) hypertension: Secondary | ICD-10-CM | POA: Insufficient documentation

## 2017-07-23 HISTORY — DX: Hyperlipidemia, unspecified: E78.5

## 2017-07-23 HISTORY — DX: Essential (primary) hypertension: I10

## 2017-07-23 HISTORY — DX: Disorder of thyroid, unspecified: E07.9

## 2017-07-23 LAB — BASIC METABOLIC PANEL
Anion gap: 11 (ref 5–15)
BUN: 19 mg/dL (ref 6–20)
CALCIUM: 9.3 mg/dL (ref 8.9–10.3)
CO2: 26 mmol/L (ref 22–32)
CREATININE: 0.76 mg/dL (ref 0.44–1.00)
Chloride: 103 mmol/L (ref 101–111)
GFR calc Af Amer: >60 mL/min (ref >60)
GFR calc non Af Amer: >60 mL/min (ref >60)
GLUCOSE: 154 mg/dL — AB (ref 65–99)
Potassium: 3.5 mmol/L (ref 3.5–5.1)
Sodium: 140 mmol/L (ref 135–145)

## 2017-07-23 LAB — URINALYSIS, COMPLETE (UACMP) WITH MICROSCOPIC
BILIRUBIN URINE: NEGATIVE
Glucose, UA: NEGATIVE mg/dL
HGB URINE DIPSTICK: NEGATIVE
KETONES UR: NEGATIVE mg/dL
LEUKOCYTES UA: NEGATIVE
NITRITE: NEGATIVE
PH: 5 (ref 5.0–8.0)
Protein, ur: 30 mg/dL — AB
SPECIFIC GRAVITY, URINE: 1.025 (ref 1.005–1.030)

## 2017-07-23 LAB — CBC
HCT: 43.8 % (ref 35.0–47.0)
Hemoglobin: 14.6 g/dL (ref 12.0–16.0)
MCH: 31.9 pg (ref 26.0–34.0)
MCHC: 33.3 g/dL (ref 32.0–36.0)
MCV: 95.8 fL (ref 80.0–100.0)
Platelets: 261 10*3/uL (ref 150–440)
RBC: 4.57 MIL/uL (ref 3.80–5.20)
RDW: 13.2 % (ref 11.5–14.5)
WBC: 11.3 10*3/uL — ABNORMAL HIGH (ref 3.6–11.0)

## 2017-07-23 NOTE — ED Provider Notes (Signed)
[facility] Emergency Department Provider Note  ____________________________________________   First MD Initiated Contact with Patient 07/23/17 1634     (approximate)  I have reviewed the triage vital signs and the nursing notes.   HISTORY  Chief Complaint Weakness and Dizziness   HPI Glenda Boone is a 67 y.o. female who comes to the emergency department via EMS with 3 days of intermittent lightheaded weakness and "dizziness" her symptoms are worse with standing up and when looking down at the floor while she is walking.  They are improved when sitting down.  Her symptoms last 2-3 seconds.  She denies vertigo.  She denies tinnitus.  She denies recent illness.  She denies chest pain shortness of breath abdominal pain nausea or vomiting.  She has had "spells" like this multiple times in the past and is never been given a clear diagnosis.  She does feel like she is urinating more than usual.   Past Medical History:  Diagnosis Date  . Anxiety   . Bronchitis   . COPD (chronic obstructive pulmonary disease) ([place])   . Depression   . Diabetes mellitus, type II ([place])   . Hyperlipidemia   . Hypertension   . Neuropathy   . Ovarian cancer ([place])   . Shingles   . Thyroid disease     Patient Active Problem List   Diagnosis Date Noted  . Shingles outbreak 06/21/2015  . Chronic bronchitis ([place]) 06/21/2015  . 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[place]) 11/28/2014  . H/O diabetes mellitus 11/28/2014  . Anxiety, generalized 11/28/2014  . H/O hypercholesterolemia 11/28/2014  . H/O: HTN (hypertension) 11/28/2014  . H/O: hypothyroidism 11/28/2014  . Depression, major, recurrent, moderate ([place]) 11/28/2014  . H/O: obesity 11/28/2014  . H/O disease 11/28/2014  . Neuropathy 11/28/2014    Past Surgical History:  Procedure Laterality Date  . ABDOMINAL HYSTERECTOMY    . ANKLE SURGERY Left   . CHOLECYSTECTOMY    . OVARY SURGERY    . SMALL INTESTINE SURGERY    .  TONSILLECTOMY      Prior to Admission medications   Medication Sig Start Date End Date Taking? Authorizing Provider  atorvastatin (LIPITOR) 40 MG tablet Take by mouth.    [provider]  gabapentin (NEURONTIN) 300 MG capsule Take 1 capsule (300 mg total) by mouth at bedtime. 07/20/17   Rainey Pines, MD  hydrochlorothiazide (HYDRODIURIL) 25 MG tablet  06/30/17   [provider]  levothyroxine (SYNTHROID, LEVOTHROID) 200 MCG tablet  06/30/17   [provider]  levothyroxine (SYNTHROID, LEVOTHROID) 50 MCG tablet  06/30/17   [provider]  lisinopril (PRINIVIL,ZESTRIL) 20 MG tablet Take 20 mg by mouth.    [provider]  metFORMIN (GLUCOPHAGE) 500 MG tablet Take by mouth.    [provider]  PARoxetine (PAXIL) 20 MG tablet Take 3 tablets (60 mg total) by mouth at bedtime. 07/20/17   Rainey Pines, MD  potassium chloride (K-DUR) 10 MEQ tablet Take by mouth.    [provider]  simvastatin (ZOCOR) 40 MG tablet Take by mouth.    [provider]  torsemide (DEMADEX) 10 MG tablet Take by mouth.    [provider]  triamcinolone (KENALOG) 0.025 % cream Reported on 06/21/2015 09/11/14   [provider]    Allergies Codeine and Demerol [meperidine hcl]  Family History  Problem Relation Age of Onset  . Heart Problems Mother   . Hypertension Mother   . Colon cancer Father   .  Arthritis-Osteo Sister     Social History Social History   Tobacco Use  . Smoking status: Current Every Day Smoker    Packs/day: 1.00    Types: Cigarettes    Start date: 12/19/1979  . Smokeless tobacco: Never Used  Substance Use Topics  . Alcohol use: No    Alcohol/week: 0.0 oz  . Drug use: No    Review of Systems Constitutional: No fever/chills Eyes: No visual changes. ENT: No sore throat. Cardiovascular: Denies chest pain. Respiratory: Denies shortness of breath. Gastrointestinal: No abdominal pain.  No nausea, no vomiting.   No diarrhea.  No constipation. Genitourinary: Negative for dysuria. Musculoskeletal: Negative for back pain. Skin: Negative for rash. Neurological: Positive for dizziness   ____________________________________________   PHYSICAL EXAM:  VITAL SIGNS: ED Triage Vitals  Enc Vitals Group     BP      Pulse      Resp      Temp      Temp src      SpO2      Weight      Height      Head Circumference      Peak Flow      Pain Score      Pain Loc      Pain Edu?      Excl. in [place]?     Constitutional: Alert and oriented x4 obese nontoxic no diaphoresis speaks in full clear sentences Eyes: PERRL EOMI. no nystagmus Head: Atraumatic.  Normal tympanic membranes bilaterally Nose: No congestion/rhinnorhea. Mouth/Throat: No trismus Neck: No stridor.   Cardiovascular: Normal rate, regular rhythm. Grossly normal heart sounds.  Good peripheral circulation. Respiratory: Normal respiratory effort.  No retractions. Lungs CTAB and moving good air Gastrointestinal: Soft nontender Musculoskeletal: No lower extremity edema   Neurologic:  Normal speech and language. No gross focal neurologic deficits are appreciated. No pronator drift 5 out of 5 grips biceps triceps normal Able-nose-Knabe Skin:  Skin is warm, dry and intact. No rash noted. Psychiatric: Mood and affect are normal. Speech and behavior are normal.    ____________________________________________   DIFFERENTIAL includes but not limited to  Central vertigo, peripheral vertigo, dehydration, orthostasis, diabetic ketoacidosis ____________________________________________   LABS (all labs ordered are listed, but only abnormal results are displayed)  Labs Reviewed  BASIC METABOLIC PANEL - Abnormal; Notable for the following components:      Result Value   Glucose, Bld 154 (*)    All other components within normal limits  CBC - Abnormal; Notable for the following components:   WBC 11.3 (*)    All other components within normal  limits  URINALYSIS, COMPLETE (UACMP) WITH MICROSCOPIC - Abnormal; Notable for the following components:   Color, Urine AMBER (*)    APPearance CLOUDY (*)    Protein, ur 30 (*)    Bacteria, UA FEW (*)    Squamous Epithelial / LPF 6-30 (*)    All other components within normal limits    Lab work reviewed by me with slightly elevated white count which is nonspecific __________________________________________  EKG   ____________________________________________  RADIOLOGY   ____________________________________________   PROCEDURES  Procedure(s) performed: no  Procedures  Critical Care performed: no  Observation: no ____________________________________________   INITIAL IMPRESSION / ASSESSMENT AND PLAN / ED COURSE  Pertinent labs & imaging results that were available during my care of the patient were reviewed by me and considered in my medical decision making (see chart for details).  The patient describes positional lightheadedness.  No vertigo.  Normal neuro exam.  She feels improved after some IV fluids.  Had a lengthy discussion with the patient and her husband at bedside regarding the diagnostic uncertainty and that I was not entirely sure what the etiology of her symptoms were but that as she felt better and was comfortable having her go home and following up with her primary care physician.  She verbalizes understanding and agreement the plan.  Discharged home with strict return precautions.      ____________________________________________   FINAL CLINICAL IMPRESSION(S) / ED DIAGNOSES  Final diagnoses:  Dehydration  Fatigue, unspecified type      NEW MEDICATIONS STARTED DURING THIS VISIT:  Discharge Medication List as of 07/23/2017  6:57 PM       Note:  This document was prepared using Dragon voice recognition software and may include unintentional dictation errors.     Darel Hong, MD 07/24/17 2226

## 2017-07-23 NOTE — Discharge Instructions (Addendum)
Please make sure you remain well-hydrated and follow-up with your primary care physician as needed.  Return to the emergency department for any concerns whatsoever.  It was a pleasure to take care of you today, and thank you for coming to our emergency department.  If you have any questions or concerns before leaving please ask the nurse to grab me and I'm more than happy to go through your aftercare instructions again.  If you were prescribed any opioid pain medication today such as Norco, Vicodin, Percocet, morphine, hydrocodone, or oxycodone please make sure you do not drive when you are taking this medication as it can alter your ability to drive safely.  If you have any concerns once you are home that you are not improving or are in fact getting worse before you can make it to your follow-up appointment, please do not hesitate to call 911 and come back for further evaluation.  Darel Hong, MD  Results for orders placed or performed during the hospital encounter of 79/89/21  Basic metabolic panel  Result Value Ref Range   Sodium 140 135 - 145 mmol/L   Potassium 3.5 3.5 - 5.1 mmol/L   Chloride 103 101 - 111 mmol/L   CO2 26 22 - 32 mmol/L   Glucose, Bld 154 (H) 65 - 99 mg/dL   BUN 19 6 - 20 mg/dL   Creatinine, Ser 0.76 0.44 - 1.00 mg/dL   Calcium 9.3 8.9 - 10.3 mg/dL   GFR calc non Af Amer >60 >60 mL/min   GFR calc Af Amer >60 >60 mL/min   Anion gap 11 5 - 15  CBC  Result Value Ref Range   WBC 11.3 (H) 3.6 - 11.0 K/uL   RBC 4.57 3.80 - 5.20 MIL/uL   Hemoglobin 14.6 12.0 - 16.0 g/dL   HCT 43.8 35.0 - 47.0 %   MCV 95.8 80.0 - 100.0 fL   MCH 31.9 26.0 - 34.0 pg   MCHC 33.3 32.0 - 36.0 g/dL   RDW 13.2 11.5 - 14.5 %   Platelets 261 150 - 440 K/uL  Urinalysis, Complete w Microscopic  Result Value Ref Range   Color, Urine AMBER (A) YELLOW   APPearance CLOUDY (A) CLEAR   Specific Gravity, Urine 1.025 1.005 - 1.030   pH 5.0 5.0 - 8.0   Glucose, UA NEGATIVE NEGATIVE mg/dL   Hgb  urine dipstick NEGATIVE NEGATIVE   Bilirubin Urine NEGATIVE NEGATIVE   Ketones, ur NEGATIVE NEGATIVE mg/dL   Protein, ur 30 (A) NEGATIVE mg/dL   Nitrite NEGATIVE NEGATIVE   Leukocytes, UA NEGATIVE NEGATIVE   RBC / HPF 0-5 0 - 5 RBC/hpf   WBC, UA 0-5 0 - 5 WBC/hpf   Bacteria, UA FEW (A) NONE SEEN   Squamous Epithelial / LPF 6-30 (A) NONE SEEN   Mucus PRESENT

## 2017-07-23 NOTE — ED Triage Notes (Signed)
Pt to ED via EMS from home c/o weakness and dizziness x3 days.  States worse when standing up and when looking down at floor while walking.  States nausea and diarrhea, 1 episode a day x3 days.  Denies falling, sob, or pain.  Hx of neuropathy, DBM, ovarian cancer, HTN, hyperlipidemia.  States started on lovestatin 3 weeks ago.

## 2017-08-26 ENCOUNTER — Telehealth: Payer: Self-pay

## 2017-08-26 NOTE — Telephone Encounter (Signed)
received fax requesting that a prior auth be done.

## 2017-08-26 NOTE — Telephone Encounter (Signed)
received approval notice for the paroxetine hcl 20mg   #270 tablets per 90 days. effective 05-26-17 to  05-25-18

## 2017-08-26 NOTE — Telephone Encounter (Signed)
prior Glenda Boone was done- pending.

## 2017-10-08 ENCOUNTER — Ambulatory Visit: Payer: Medicare HMO | Admitting: Psychiatry

## 2017-10-12 ENCOUNTER — Ambulatory Visit: Payer: Medicare HMO | Admitting: Psychiatry

## 2018-01-01 ENCOUNTER — Other Ambulatory Visit: Payer: Self-pay | Admitting: Psychiatry

## 2018-01-01 DIAGNOSIS — F331 Major depressive disorder, recurrent, moderate: Secondary | ICD-10-CM

## 2018-01-04 ENCOUNTER — Other Ambulatory Visit: Payer: Self-pay | Admitting: Psychiatry

## 2018-01-04 DIAGNOSIS — F331 Major depressive disorder, recurrent, moderate: Secondary | ICD-10-CM

## 2018-01-04 MED ORDER — PAROXETINE HCL 20 MG PO TABS: 60.0000 mg | ORAL_TABLET | Freq: Every day | ORAL | 2 refills | Status: DC

## 2018-01-04 MED ORDER — GABAPENTIN 300 MG PO CAPS: 300.0000 mg | ORAL_CAPSULE | Freq: Every day | ORAL | 1 refills | Status: DC

## 2018-01-04 NOTE — Telephone Encounter (Signed)
Meds refilled.

## 2018-01-04 NOTE — Telephone Encounter (Signed)
pt called left message that she needs enough medication to get to her appt on  01-18-18

## 2018-01-18 ENCOUNTER — Ambulatory Visit: Payer: Medicare HMO | Admitting: Psychiatry

## 2018-01-18 ENCOUNTER — Encounter: Payer: Self-pay | Admitting: Psychiatry

## 2018-01-18 ENCOUNTER — Other Ambulatory Visit: Payer: Self-pay

## 2018-01-18 ENCOUNTER — Ambulatory Visit (INDEPENDENT_AMBULATORY_CARE_PROVIDER_SITE_OTHER): Payer: Medicare HMO | Admitting: Psychiatry

## 2018-01-18 VITALS — BP 125/86 | HR 80 | Temp 99.4°F

## 2018-01-18 DIAGNOSIS — F331 Major depressive disorder, recurrent, moderate: Secondary | ICD-10-CM | POA: Diagnosis not present

## 2018-01-18 DIAGNOSIS — F411 Generalized anxiety disorder: Secondary | ICD-10-CM | POA: Diagnosis not present

## 2018-01-18 MED ORDER — PAROXETINE HCL 20 MG PO TABS: 60.0000 mg | ORAL_TABLET | Freq: Every day | ORAL | 2 refills | Status: DC

## 2018-01-18 MED ORDER — GABAPENTIN 300 MG PO CAPS: 300.0000 mg | ORAL_CAPSULE | Freq: Every day | ORAL | 1 refills | Status: DC

## 2018-01-18 NOTE — Progress Notes (Signed)
BH MD/PA/NP OP Progress Note  01/18/2018 1:41 PM Glenda Boone  MRN:  [id number]  Subjective:  Patient is a morbidly obese 67 yo female who presented for the follow-up appointment. She was sitting in the wheelchair. She reported that she has lost approximately 10-15 pounds as her grandson in the stool her CBT card and she was unable to buy groceries.  She reported that finally [employer] started helping her with the groceries.  She reported that she is able to start eating again.  She reported that she has been compliant with her medications and they are helping her.  Her Paxil was approved for prior authorization.  She reported that the current combination medication is helping her and she has already stopped taking the Xanax.  She just takes the Paxil and the Neurontin and the medication are helping.  Her family is supportive.  She denied having any suicidal ideations or plans.  She uses the wheelchair and the walker for ambulation.  She is trying to lose weight.  She denied having any suicidal homicidal ideations or plans and her mood symptoms are currently improving at this time.       She is in the wheelchair and cannot walk.        Chief Complaint:  Chief Complaint    Follow-up; Medication Refill     Visit Diagnosis:     ICD-10-CM   1. MDD (major depressive disorder), recurrent episode, moderate (HCC) F33.1   2. Generalized anxiety disorder F41.1     Past Medical History:  Past Medical History:  Diagnosis Date  . Anxiety   . Bronchitis   . COPD (chronic obstructive pulmonary disease) ([place])   . Depression   . Diabetes mellitus, type II ([place])   . Hyperlipidemia   . Hypertension   . Neuropathy   . Ovarian cancer ([place])   . Shingles   . Thyroid disease     Past Surgical History:  Procedure Laterality Date  . ABDOMINAL HYSTERECTOMY    . ANKLE SURGERY Left   . CHOLECYSTECTOMY    . OVARY SURGERY    . SMALL INTESTINE SURGERY    . TONSILLECTOMY     Family History:   Family History  Problem Relation Age of Onset  . Heart Problems Mother   . Hypertension Mother   . Colon cancer Father   . Arthritis-Osteo Sister    Social History:  Social History   Socioeconomic History  . Marital status: Widowed    Spouse name: Not on file  . Number of children: Not on file  . Years of education: Not on file  . Highest education level: Not on file  Occupational History  . Not on file  Social Needs  . Financial resource strain: Not on file  . Food insecurity:    Worry: Not on file    Inability: Not on file  . Transportation needs:    Medical: Not on file    Non-medical: Not on file  Tobacco Use  . Smoking status: Current Every Day Smoker    Packs/day: 1.00    Types: Cigarettes    Start date: 12/19/1979  . Smokeless tobacco: Never Used  Substance and Sexual Activity  . Alcohol use: No    Alcohol/week: 0.0 standard drinks  . Drug use: No  . Sexual activity: Never  Lifestyle  . Physical activity:    Days per week: Not on file    Minutes per session: Not on file  . Stress:  Not on file  Relationships  . Social connections:    Talks on phone: Not on file    Gets together: Not on file    Attends religious service: Not on file    Active member of club or organization: Not on file    Attends meetings of clubs or organizations: Not on file    Relationship status: Not on file  Other Topics Concern  . Not on file  Social History Narrative  . Not on file   Additional History:   She currently lives by herself. Family members help her.     Assessment:   Musculoskeletal: Strength & Muscle Tone: decreased Gait & Station: unsteady, walking slowly  Patient leans: N/A  Psychiatric Specialty Exam: Medication Refill  Associated symptoms include weakness. Pertinent negatives include no congestion or neck pain.    Review of Systems  Constitutional: Positive for malaise/fatigue and weight loss.  HENT: Negative for congestion and tinnitus.    Cardiovascular: Positive for leg swelling.  Gastrointestinal: Positive for diarrhea.  Genitourinary: Negative for urgency.  Musculoskeletal: Positive for joint pain. Negative for back pain and neck pain.  Neurological: Positive for weakness. Negative for dizziness.  Endo/Heme/Allergies: Negative for environmental allergies.  Psychiatric/Behavioral: Positive for depression.  All other systems reviewed and are negative.   Blood pressure 125/86, pulse 80, temperature 99.4 F (37.4 C), temperature source Oral.There is no height or weight on file to calculate BMI.  General Appearance: Casual  Eye Contact:  Fair  Speech:  Clear and Coherent  Volume:  Normal  Mood:  Euthymic  Affect:  Congruent  Thought Process:  Coherent  Orientation:  Full (Time, Place, and Person)  Thought Content:  WDL  Suicidal Thoughts:  No  Homicidal Thoughts:  No  Memory:  NA  Judgement:  Fair  Insight:  Fair  Psychomotor Activity:  Normal  Concentration:  Fair  Recall:  [employer] of Knowledge: Fair  Language: Fair  Akathisia:  No  Handed:  Right  AIMS (if indicated):    Assets:  Communication Skills Desire for Improvement Social Support  ADL's:  Intact  Cognition: WNL  Sleep:     Is the patient at risk to self?  No. Has the patient been a risk to self in the past 6 months?  No. Has the patient been a risk to self within the distant past?  No. Is the patient a risk to others?  No. Has the patient been a risk to others in the past 6 months?  No. Has the patient been a risk to others within the distant past?  No.  Current Medications: Current Outpatient Medications  Medication Sig Dispense Refill  . atorvastatin (LIPITOR) 40 MG tablet Take by mouth.    . gabapentin (NEURONTIN) 300 MG capsule Take 1 capsule (300 mg total) by mouth at bedtime. 90 capsule 1  . hydrochlorothiazide (HYDRODIURIL) 25 MG tablet     . levothyroxine (SYNTHROID, LEVOTHROID) 200 MCG tablet     . levothyroxine (SYNTHROID,  LEVOTHROID) 50 MCG tablet     . lisinopril (PRINIVIL,ZESTRIL) 20 MG tablet Take 20 mg by mouth.    . metFORMIN (GLUCOPHAGE) 500 MG tablet Take by mouth.    Marland Kitchen PARoxetine (PAXIL) 20 MG tablet Take 3 tablets (60 mg total) by mouth at bedtime. 270 tablet 2  . potassium chloride (K-DUR) 10 MEQ tablet Take by mouth.    . simvastatin (ZOCOR) 40 MG tablet Take by mouth.    . torsemide (DEMADEX) 10 MG  tablet Take by mouth.    . triamcinolone (KENALOG) 0.025 % cream Reported on 06/21/2015     No current facility-administered medications for this visit.     Medical Decision Making:  Review of Psycho-Social Stressors (1) and Review and summation of old records (2)  Treatment Plan Summary:Medication management   Discussed with patient what the medications treatment risks benefits and alternatives.   She will continue on Paxil 60 mg by mouth daily.- 90 day supply of the medications given  Neurontin 300mg   Qhs.  Follow-up in 3 months or earlier depending on her symptoms   More than 50% of the time spent in psychoeducation, counseling and coordination of care.    This note was generated in part or whole with voice recognition software. Voice regonition is usually quite accurate but there are transcription errors that can and very often do occur. I apologize for any typographical errors that were not detected and corrected.   Rainey Pines, MD    01/18/2018, 1:41 PM

## 2018-01-19 ENCOUNTER — Telehealth: Payer: Self-pay

## 2018-01-19 NOTE — Telephone Encounter (Signed)
pt called left message that dr Gretel Acre did not change her gabapentin states that dr Gretel Acre was supposed to have had increased dosage  Below is a copy of Dr. Gretel Acre office note.  I do not see anything about a dosage change.  Medical Decision Making:  Review of Psycho-Social Stressors (1) and Review and summation of old records (2)  Treatment Plan Summary:Medication management   Discussed with patient what the medications treatment risks benefits and alternatives.   She will continue on Paxil 60 mg by mouth daily.- 90 day supply of the medications given  Neurontin 300mg   Qhs.  Follow-up in 3 months or earlier depending on her symptoms   More than 50% of the time spent in psychoeducation, counseling and coordination of care.    This note was generated in part or whole with voice recognition software. Voice regonition is usually quite accurate but there are transcription errors that can and very often do occur. I apologize for any typographical errors that were not detected and corrected.   Rainey Pines, MD    01/18/2018, 1:41 PM

## 2018-01-19 NOTE — Telephone Encounter (Signed)
Tc to patient .  Left a voicemail message asking her to please call office back.

## 2018-01-19 NOTE — Telephone Encounter (Signed)
pt called left message that dr Gretel Acre did not change her gabapentin states that dr Gretel Acre was supposed to have had increased dosage  Below is a copy of Dr. Gretel Acre office note.  I do not see anything about a dosage change.   Medical Decision Making:  Review of Psycho-Social Stressors (1) and Review and summation of old records (2)  Treatment Plan Summary:Medication management   Discussed with patient what the medications treatment risks benefits and alternatives.   She will continue on Paxil 60 mg by mouth daily.- 90 day supply of the medications given  Neurontin 300mg   Qhs.  Follow-up in 3 months or earlier depending on her symptoms   More than 50% of the time spent in psychoeducation, counseling and coordination of care.

## 2018-01-19 NOTE — Telephone Encounter (Signed)
I have reviewed Dr.Faheem's notes and it says Gabapentin 300 mg . I am not familiar with this patient and hence will defer this to Dr.Faheem since the notes also does not say that the dosage was supposed to be increased.

## 2018-05-14 ENCOUNTER — Telehealth: Payer: Self-pay

## 2018-05-14 NOTE — Telephone Encounter (Signed)
Pt called left message this is a dr. Gretel Acre patient .  Pt is out of paxil needs refill .   Pt has appt on  05-17-18 but needs enough to get to that appt she is completely out   PARoxetine (PAXIL) 20 MG tablet  Medication  Date: 01/18/2018 Department: [facility] Ordering/Authorizing: Rainey Pines, MD  Order Providers   Prescribing Provider Encounter Provider  Rainey Pines, MD Rainey Pines, MD  Outpatient Medication Detail    Disp Refills Start End   PARoxetine (PAXIL) 20 MG tablet 270 tablet 2 01/18/2018    Sig - Route: Take 3 tablets (60 mg total) by mouth at bedtime. - Oral   Sent to pharmacy as: PARoxetine (PAXIL) 20 MG tablet   E-Prescribing Status: Receipt confirmed by pharmacy (01/18/2018 1:52 PM EDT)

## 2018-05-14 NOTE — Telephone Encounter (Signed)
Based on Dr.Faheems last med order- she gave 90 days supply with refills ,so please call pt and ask her to confirm with pharmacy.

## 2018-05-17 ENCOUNTER — Ambulatory Visit: Payer: Medicare HMO | Admitting: Psychiatry

## 2018-05-17 ENCOUNTER — Other Ambulatory Visit: Payer: Self-pay | Admitting: Psychiatry

## 2018-05-17 DIAGNOSIS — F331 Major depressive disorder, recurrent, moderate: Secondary | ICD-10-CM

## 2018-05-17 MED ORDER — PAROXETINE HCL 20 MG PO TABS: 60.0000 mg | ORAL_TABLET | Freq: Every day | ORAL | 1 refills | Status: DC

## 2018-05-17 NOTE — Telephone Encounter (Signed)
Med refilled.

## 2018-05-31 ENCOUNTER — Ambulatory Visit: Payer: Medicare HMO | Admitting: Psychiatry

## 2018-11-07 ENCOUNTER — Other Ambulatory Visit: Payer: Self-pay | Admitting: Psychiatry

## 2018-11-07 DIAGNOSIS — F331 Major depressive disorder, recurrent, moderate: Secondary | ICD-10-CM

## 2018-11-22 ENCOUNTER — Encounter: Payer: Self-pay | Admitting: Psychiatry

## 2018-11-22 ENCOUNTER — Other Ambulatory Visit: Payer: Self-pay

## 2018-11-22 ENCOUNTER — Ambulatory Visit (INDEPENDENT_AMBULATORY_CARE_PROVIDER_SITE_OTHER): Payer: Medicare HMO | Admitting: Psychiatry

## 2018-11-22 DIAGNOSIS — F411 Generalized anxiety disorder: Secondary | ICD-10-CM | POA: Diagnosis not present

## 2018-11-22 DIAGNOSIS — F331 Major depressive disorder, recurrent, moderate: Secondary | ICD-10-CM

## 2018-11-22 MED ORDER — PAROXETINE HCL 20 MG PO TABS: 60.0000 mg | ORAL_TABLET | Freq: Every day | ORAL | 1 refills | Status: DC

## 2018-11-22 MED ORDER — GABAPENTIN 300 MG PO CAPS: 300.0000 mg | ORAL_CAPSULE | Freq: Every day | ORAL | 1 refills | Status: DC

## 2018-11-22 NOTE — Progress Notes (Signed)
Patient ID: Glenda Boone, female   DOB: January 13, 1951, 68 y.o.   MRN: [id number]   Patient is a 68 year old female with history of anxiety and depression who was followed up for medication management. She reported that she has been compliant with her medications and has been taking Paxil regularly. She reported that gabapentin also helps her. She does not have any other acute symptoms. She's trying to stay safe and wearing her mask on a regular basis. No other acute symptoms noted at this time. She denied having any suicidal or homicidal ideations or plans. She's able to contract for safety at this time.  Plan I will refill her medications. Patient will follow up and three months earlier depending on her symptoms  I discussed the assessment and treatment plan with the patient. The patient was provided an opportunity to ask questions and all were answered. The patient agreed with the plan and demonstrated an understanding of the instructions.   The patient was advised to call back or seek an in-person evaluation if the symptoms worsen or if the condition fails to improve as anticipated.   I provided 15 minutes of non-face-to-face time during this encounter.

## 2018-11-25 ENCOUNTER — Telehealth: Payer: Self-pay

## 2018-11-25 NOTE — Telephone Encounter (Signed)
paroxetine 20mg  270 tablets per 90 days was approved from  05-26-18 to  05-26-19

## 2019-01-17 ENCOUNTER — Encounter: Payer: Self-pay | Admitting: Psychiatry

## 2019-01-17 ENCOUNTER — Other Ambulatory Visit: Payer: Self-pay

## 2019-01-17 ENCOUNTER — Ambulatory Visit (INDEPENDENT_AMBULATORY_CARE_PROVIDER_SITE_OTHER): Payer: Medicare HMO | Admitting: Psychiatry

## 2019-01-17 DIAGNOSIS — F331 Major depressive disorder, recurrent, moderate: Secondary | ICD-10-CM

## 2019-01-17 DIAGNOSIS — F411 Generalized anxiety disorder: Secondary | ICD-10-CM | POA: Diagnosis not present

## 2019-01-17 MED ORDER — GABAPENTIN 400 MG PO CAPS: 400.0000 mg | ORAL_CAPSULE | Freq: Every day | ORAL | 3 refills | Status: DC

## 2019-01-17 MED ORDER — PAROXETINE HCL 20 MG PO TABS: 60.0000 mg | ORAL_TABLET | Freq: Every day | ORAL | 1 refills | Status: DC

## 2019-01-17 NOTE — Progress Notes (Signed)
Patient ID: Glenda Boone, female   DOB: 1950-06-09, 68 y.o.   MRN: [id number]    Patient is a 68 year old female with history of depression and anxiety who was followed up for medication management. She reported that she has been staying at home and trying to avoid all activities due to her medical comorbid conditions. She reported that her nephew came to visit her. Patient reported that she has been having difficulty sleeping at night and wants to go higher on the dose of gabapentin. She tried the Paxil at night but it was making her sleep too early so she is still taking it in the morning. She is compliant with her medications. She does not have any other acute symptoms. She denied feeling depressed hopeless helpless. She is able to contract for safety at this time.  Plan I will titrate the dose of gabapentin 400 mg QHS. Continue Paxil is prescribed.  Follow-up in three months earlier depending on her symptoms.   I connected with patient via telemedicine application and verified that I am speaking with the correct person using two identifiers.  I discussed the limitations of evaluation and management by telemedicine and the availability of in person appointments. The patient expressed understanding and agreed to proceed.   I discussed the assessment and treatment plan with the patient. The patient was provided an opportunity to ask questions and all were answered. The patient agreed with the plan and demonstrated an understanding of the instructions.   The patient was advised to call back or seek an in-person evaluation if the symptoms worsen or if the condition fails to improve as anticipated.   I provided 15 minutes of non-face-to-face time during this encounter.

## 2019-03-09 ENCOUNTER — Other Ambulatory Visit: Payer: Self-pay

## 2019-03-09 ENCOUNTER — Observation Stay: Payer: Medicare HMO

## 2019-03-09 ENCOUNTER — Observation Stay
Admission: EM | Admit: 2019-03-09 | Discharge: 2019-03-10 | Disposition: A | Discharge status: Home / Self Care | Payer: Medicare HMO | Attending: Internal Medicine | Admitting: Internal Medicine

## 2019-03-09 ENCOUNTER — Emergency Department: Payer: Medicare HMO

## 2019-03-09 ENCOUNTER — Encounter: Payer: Self-pay | Admitting: Emergency Medicine

## 2019-03-09 DIAGNOSIS — F329 Major depressive disorder, single episode, unspecified: Secondary | ICD-10-CM | POA: Diagnosis not present

## 2019-03-09 DIAGNOSIS — Z7984 Long term (current) use of oral hypoglycemic drugs: Secondary | ICD-10-CM | POA: Diagnosis not present

## 2019-03-09 DIAGNOSIS — Z20828 Contact with and (suspected) exposure to other viral communicable diseases: Secondary | ICD-10-CM | POA: Insufficient documentation

## 2019-03-09 DIAGNOSIS — Z79899 Other long term (current) drug therapy: Secondary | ICD-10-CM | POA: Diagnosis not present

## 2019-03-09 DIAGNOSIS — I119 Hypertensive heart disease without heart failure: Secondary | ICD-10-CM | POA: Insufficient documentation

## 2019-03-09 DIAGNOSIS — I6782 Cerebral ischemia: Secondary | ICD-10-CM | POA: Insufficient documentation

## 2019-03-09 DIAGNOSIS — Z79890 Hormone replacement therapy (postmenopausal): Secondary | ICD-10-CM | POA: Diagnosis not present

## 2019-03-09 DIAGNOSIS — Z66 Do not resuscitate: Secondary | ICD-10-CM | POA: Diagnosis not present

## 2019-03-09 DIAGNOSIS — Z6841 Body Mass Index (BMI) 40.0 and over, adult: Secondary | ICD-10-CM | POA: Insufficient documentation

## 2019-03-09 DIAGNOSIS — Z716 Tobacco abuse counseling: Secondary | ICD-10-CM | POA: Diagnosis not present

## 2019-03-09 DIAGNOSIS — F411 Generalized anxiety disorder: Secondary | ICD-10-CM | POA: Insufficient documentation

## 2019-03-09 DIAGNOSIS — J449 Chronic obstructive pulmonary disease, unspecified: Secondary | ICD-10-CM | POA: Diagnosis not present

## 2019-03-09 DIAGNOSIS — Z8543 Personal history of malignant neoplasm of ovary: Secondary | ICD-10-CM | POA: Insufficient documentation

## 2019-03-09 DIAGNOSIS — Z23 Encounter for immunization: Secondary | ICD-10-CM | POA: Diagnosis not present

## 2019-03-09 DIAGNOSIS — E860 Dehydration: Secondary | ICD-10-CM | POA: Diagnosis not present

## 2019-03-09 DIAGNOSIS — E079 Disorder of thyroid, unspecified: Secondary | ICD-10-CM | POA: Diagnosis not present

## 2019-03-09 DIAGNOSIS — I071 Rheumatic tricuspid insufficiency: Secondary | ICD-10-CM | POA: Insufficient documentation

## 2019-03-09 DIAGNOSIS — Z885 Allergy status to narcotic agent status: Secondary | ICD-10-CM | POA: Insufficient documentation

## 2019-03-09 DIAGNOSIS — Z85819 Personal history of malignant neoplasm of unspecified site of lip, oral cavity, and pharynx: Secondary | ICD-10-CM | POA: Insufficient documentation

## 2019-03-09 DIAGNOSIS — E1140 Type 2 diabetes mellitus with diabetic neuropathy, unspecified: Secondary | ICD-10-CM | POA: Insufficient documentation

## 2019-03-09 DIAGNOSIS — R130 Aphagia: Secondary | ICD-10-CM | POA: Diagnosis present

## 2019-03-09 DIAGNOSIS — E785 Hyperlipidemia, unspecified: Secondary | ICD-10-CM | POA: Diagnosis not present

## 2019-03-09 DIAGNOSIS — R4701 Aphasia: Secondary | ICD-10-CM | POA: Diagnosis present

## 2019-03-09 DIAGNOSIS — M6281 Muscle weakness (generalized): Secondary | ICD-10-CM | POA: Insufficient documentation

## 2019-03-09 DIAGNOSIS — Z8249 Family history of ischemic heart disease and other diseases of the circulatory system: Secondary | ICD-10-CM | POA: Insufficient documentation

## 2019-03-09 DIAGNOSIS — F17210 Nicotine dependence, cigarettes, uncomplicated: Secondary | ICD-10-CM | POA: Diagnosis not present

## 2019-03-09 LAB — HEMOGLOBIN A1C
Hgb A1c MFr Bld: 5.6 % (ref 4.8–5.6)
Mean Plasma Glucose: 114.02 mg/dL

## 2019-03-09 LAB — COMPREHENSIVE METABOLIC PANEL
ALT: 25 U/L (ref 0–44)
AST: 30 U/L (ref 15–41)
Albumin: 3.8 g/dL (ref 3.5–5.0)
Alkaline Phosphatase: 62 U/L (ref 38–126)
Anion gap: 10 (ref 5–15)
BUN: 27 mg/dL — ABNORMAL HIGH (ref 8–23)
CO2: 26 mmol/L (ref 22–32)
Calcium: 9.3 mg/dL (ref 8.9–10.3)
Chloride: 102 mmol/L (ref 98–111)
Creatinine, Ser: 1.03 mg/dL — ABNORMAL HIGH (ref 0.44–1.00)
GFR calc Af Amer: >60 mL/min (ref >60)
GFR calc non Af Amer: 55 mL/min — ABNORMAL LOW (ref >60)
Glucose, Bld: 142 mg/dL — ABNORMAL HIGH (ref 70–99)
Potassium: 3.7 mmol/L (ref 3.5–5.1)
Sodium: 138 mmol/L (ref 135–145)
Total Bilirubin: 0.8 mg/dL (ref 0.3–1.2)
Total Protein: 7 g/dL (ref 6.5–8.1)

## 2019-03-09 LAB — URINALYSIS, COMPLETE (UACMP) WITH MICROSCOPIC
Bacteria, UA: NONE SEEN
Bilirubin Urine: NEGATIVE
Glucose, UA: NEGATIVE mg/dL
Hgb urine dipstick: NEGATIVE
Ketones, ur: NEGATIVE mg/dL
Leukocytes,Ua: NEGATIVE
Nitrite: NEGATIVE
Protein, ur: NEGATIVE mg/dL
Specific Gravity, Urine: 1.021 (ref 1.005–1.030)
pH: 5 (ref 5.0–8.0)

## 2019-03-09 LAB — CBC WITH DIFFERENTIAL/PLATELET
Abs Immature Granulocytes: 0.04 10*3/uL (ref 0.00–0.07)
Basophils Absolute: 0.1 10*3/uL (ref 0.0–0.1)
Basophils Relative: 1 %
Eosinophils Absolute: 0.3 10*3/uL (ref 0.0–0.5)
Eosinophils Relative: 2 %
HCT: 42.1 % (ref 36.0–46.0)
Hemoglobin: 14.1 g/dL (ref 12.0–15.0)
Immature Granulocytes: 0 %
Lymphocytes Relative: 16 %
Lymphs Abs: 2 10*3/uL (ref 0.7–4.0)
MCH: 32 pg (ref 26.0–34.0)
MCHC: 33.5 g/dL (ref 30.0–36.0)
MCV: 95.7 fL (ref 80.0–100.0)
Monocytes Absolute: 0.7 10*3/uL (ref 0.1–1.0)
Monocytes Relative: 6 %
Neutro Abs: 9.3 10*3/uL — ABNORMAL HIGH (ref 1.7–7.7)
Neutrophils Relative %: 75 %
Platelets: 257 10*3/uL (ref 150–400)
RBC: 4.4 MIL/uL (ref 3.87–5.11)
RDW: 13.6 % (ref 11.5–15.5)
WBC: 12.4 10*3/uL — ABNORMAL HIGH (ref 4.0–10.5)
nRBC: 0 % (ref 0.0–0.2)

## 2019-03-09 LAB — GLUCOSE, CAPILLARY
Glucose-Capillary: 97 mg/dL (ref 70–99)
Glucose-Capillary: 132 mg/dL — ABNORMAL HIGH (ref 70–99)

## 2019-03-09 LAB — PROTIME-INR
INR: 1 (ref 0.8–1.2)
Prothrombin Time: 12.9 seconds (ref 11.4–15.2)

## 2019-03-09 MED ORDER — ACETAMINOPHEN 500 MG PO TABS
1000.0000 mg | ORAL_TABLET | Freq: Once | ORAL | Status: AC
  Administered 2019-03-09: 16:00:00 1000 mg via ORAL
  Filled 2019-03-09: qty 2

## 2019-03-09 MED ORDER — ACETAMINOPHEN 650 MG RE SUPP: 650.0000 mg | RECTAL | Status: DC | PRN

## 2019-03-09 MED ORDER — INFLUENZA VAC A&B SA ADJ QUAD 0.5 ML IM PRSY
0.5000 mL | PREFILLED_SYRINGE | INTRAMUSCULAR | Status: AC
  Administered 2019-03-10: 0.5 mL via INTRAMUSCULAR
  Filled 2019-03-09 (×2): qty 0.5

## 2019-03-09 MED ORDER — SODIUM CHLORIDE 0.9 % IV SOLN
INTRAVENOUS | Status: DC
  Administered 2019-03-09 – 2019-03-10 (×2): via INTRAVENOUS

## 2019-03-09 MED ORDER — GUAIFENESIN-DM 100-10 MG/5ML PO SYRP: 5.0000 mL | ORAL_SOLUTION | ORAL | Status: DC | PRN

## 2019-03-09 MED ORDER — LISINOPRIL 10 MG PO TABS: 20.0000 mg | ORAL_TABLET | Freq: Every day | ORAL | Status: DC

## 2019-03-09 MED ORDER — STROKE: EARLY STAGES OF RECOVERY BOOK
Freq: Once | Status: AC
  Administered 2019-03-09: 19:00:00

## 2019-03-09 MED ORDER — ACETAMINOPHEN 325 MG PO TABS: 650.0000 mg | ORAL_TABLET | ORAL | Status: DC | PRN

## 2019-03-09 MED ORDER — ASPIRIN 300 MG RE SUPP: 300.0000 mg | Freq: Every day | RECTAL | Status: DC

## 2019-03-09 MED ORDER — ACETAMINOPHEN 160 MG/5ML PO SOLN
650.0000 mg | ORAL | Status: DC | PRN
  Filled 2019-03-09: qty 20.3

## 2019-03-09 MED ORDER — LORAZEPAM 1 MG PO TABS
1.0000 mg | ORAL_TABLET | Freq: Once | ORAL | Status: AC
  Administered 2019-03-09: 21:00:00 1 mg via ORAL
  Filled 2019-03-09: qty 1

## 2019-03-09 MED ORDER — GABAPENTIN 400 MG PO CAPS
400.0000 mg | ORAL_CAPSULE | Freq: Every day | ORAL | Status: DC
  Filled 2019-03-09 (×2): qty 1

## 2019-03-09 MED ORDER — ATORVASTATIN CALCIUM 20 MG PO TABS: 40.0000 mg | ORAL_TABLET | Freq: Every day | ORAL | Status: DC

## 2019-03-09 MED ORDER — PNEUMOCOCCAL VAC POLYVALENT 25 MCG/0.5ML IJ INJ
0.5000 mL | INJECTION | INTRAMUSCULAR | Status: DC
  Filled 2019-03-09: qty 0.5

## 2019-03-09 MED ORDER — INSULIN ASPART 100 UNIT/ML ~~LOC~~ SOLN: 0.0000 [IU] | Freq: Every day | SUBCUTANEOUS | Status: DC

## 2019-03-09 MED ORDER — LEVOTHYROXINE SODIUM 50 MCG PO TABS
50.0000 ug | ORAL_TABLET | Freq: Every day | ORAL | Status: DC
  Administered 2019-03-10: 06:00:00 50 ug via ORAL
  Filled 2019-03-09: qty 1

## 2019-03-09 MED ORDER — ASPIRIN 325 MG PO TABS
325.0000 mg | ORAL_TABLET | Freq: Every day | ORAL | Status: DC
  Administered 2019-03-09: 325 mg via ORAL
  Filled 2019-03-09 (×2): qty 1

## 2019-03-09 MED ORDER — ENOXAPARIN SODIUM 40 MG/0.4ML ~~LOC~~ SOLN
40.0000 mg | Freq: Two times a day (BID) | SUBCUTANEOUS | Status: DC
  Administered 2019-03-09 – 2019-03-10 (×2): 40 mg via SUBCUTANEOUS
  Filled 2019-03-09 (×2): qty 0.4

## 2019-03-09 MED ORDER — INSULIN ASPART 100 UNIT/ML ~~LOC~~ SOLN: 0.0000 [IU] | Freq: Three times a day (TID) | SUBCUTANEOUS | Status: DC

## 2019-03-09 MED ORDER — SENNOSIDES-DOCUSATE SODIUM 8.6-50 MG PO TABS: 1.0000 | ORAL_TABLET | Freq: Every evening | ORAL | Status: DC | PRN

## 2019-03-09 NOTE — ED Provider Notes (Signed)
[facility] Emergency Department Provider Note  ____________________________________________   First MD Initiated Contact with Patient 03/09/19 1559     (approximate)  I have reviewed the triage vital signs and the nursing notes.  History  Chief Complaint AMS    HPI Glenda Boone is a 68 y.o. female with a history of diabetes, hypertension, hyperlipidemia, ovarian cancer, COPD who presents emergency department for a 5-minute episode of aphasia.  Patient states she was eating lunch with her daughter this afternoon, when she developed acute onset of word finding difficulties.  This lasted for approximately 5 minutes and was witnessed by the daughter.  There was no associated facial droop, weakness, numbness, or tingling.  On arrival to the emergency department she states her symptoms have resolved.  She denies any history of CVA.  She reports a mild headache related to missing her glasses.   Past Medical Hx Past Medical History:  Diagnosis Date  . Anxiety   . Bronchitis   . COPD (chronic obstructive pulmonary disease) ([place])   . Depression   . Diabetes mellitus, type II ([place])   . Hyperlipidemia   . Hypertension   . Neuropathy   . Ovarian cancer ([place])   . Shingles   . Thyroid disease     Problem List Patient Active Problem List   Diagnosis Date Noted  . Shingles outbreak 06/21/2015  . Chronic bronchitis ([place]) 06/21/2015  . 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[place]) 11/28/2014  . H/O diabetes mellitus 11/28/2014  . Anxiety, generalized 11/28/2014  . H/O hypercholesterolemia 11/28/2014  . H/O: HTN (hypertension) 11/28/2014  . H/O: hypothyroidism 11/28/2014  . Depression, major, recurrent, moderate ([place]) 11/28/2014  . H/O: obesity 11/28/2014  . H/O disease 11/28/2014  . Neuropathy 11/28/2014    Past Surgical Hx Past Surgical History:  Procedure Laterality Date  . ABDOMINAL HYSTERECTOMY    . ANKLE SURGERY Left   . CHOLECYSTECTOMY    . OVARY  SURGERY    . SMALL INTESTINE SURGERY    . TONSILLECTOMY      Medications Prior to Admission medications   Medication Sig Start Date End Date Taking? Authorizing Provider  atorvastatin (LIPITOR) 40 MG tablet Take by mouth.    [provider]  gabapentin (NEURONTIN) 400 MG capsule Take 1 capsule (400 mg total) by mouth at bedtime. 01/17/19   Rainey Pines, MD  hydrochlorothiazide (HYDRODIURIL) 25 MG tablet  06/30/17   [provider]  levothyroxine (SYNTHROID, LEVOTHROID) 200 MCG tablet  06/30/17   [provider]  levothyroxine (SYNTHROID, LEVOTHROID) 50 MCG tablet  06/30/17   [provider]  lisinopril (PRINIVIL,ZESTRIL) 20 MG tablet Take 20 mg by mouth.    [provider]  losartan (COZAAR) 50 MG tablet Take 50 mg by mouth daily. for high blood pressure 01/09/18   [provider]  metFORMIN (GLUCOPHAGE) 500 MG tablet Take by mouth.    [provider]  metFORMIN (GLUCOPHAGE-XR) 500 MG 24 hr tablet TAKE 2 TABLETS BY MOUTH TWICE DAILY FOR DIABETES 12/23/17   [provider]  PARoxetine (PAXIL) 20 MG tablet Take 3 tablets (60 mg total) by mouth daily. 01/17/19   Rainey Pines, MD  potassium chloride (K-DUR) 10 MEQ tablet Take by mouth.    [provider]  simvastatin (ZOCOR) 40 MG tablet Take by mouth.    [provider]  torsemide (DEMADEX) 10 MG tablet Take by mouth.    [provider]  triamcinolone (KENALOG) 0.025 % cream Reported on 06/21/2015  09/11/14   [provider]    Allergies Codeine and Demerol [meperidine hcl]  Family Hx Family History  Problem Relation Age of Onset  . Heart Problems Mother   . Hypertension Mother   . Colon cancer Father   . Arthritis-Osteo Sister     Social Hx Social History   Tobacco Use  . Smoking status: Current Every Day Smoker    Packs/day: 1.00    Types: Cigarettes    Start date: 12/19/1979  . Smokeless tobacco: Never Used  Substance Use Topics   . Alcohol use: No    Alcohol/week: 0.0 standard drinks  . Drug use: No     Review of Systems  Constitutional: Negative for fever, chills. Eyes: Negative for visual changes. ENT: Negative for sore throat. Cardiovascular: Negative for chest pain. Respiratory: Negative for shortness of breath. Gastrointestinal: Negative for nausea, vomiting.  Genitourinary: Negative for dysuria. Musculoskeletal: Negative for leg swelling. Skin: Negative for rash. Neurological: + aphasia   Physical Exam  Vital Signs: ED Triage Vitals  Enc Vitals Group     BP 03/09/19 1559 (!) 136/59     Pulse Rate 03/09/19 1553 93     Resp 03/09/19 1553 16     Temp 03/09/19 1553 98.5 F (36.9 C)     Temp Source 03/09/19 1553 Oral     SpO2 03/09/19 1553 95 %     Weight 03/09/19 1551 (!) 300 lb 0.7 oz (136.1 kg)     Height 03/09/19 1551 5\' 3"  (1.6 m)     Head Circumference --      Peak Flow --      Pain Score 03/09/19 1551 0     Pain Loc --      Pain Edu? --      Excl. in [place]? --     Constitutional: Alert and oriented.  Head: Normocephalic. Atraumatic. Eyes: Conjunctivae clear. Sclera anicteric. Nose: No congestion. No rhinorrhea. Mouth/Throat: Mucous membranes are moist.  Neck: No stridor.   Cardiovascular: Normal rate, regular rhythm. Extremities well perfused. Respiratory: Normal respiratory effort.  Lungs CTAB. Gastrointestinal: Non-distended.  Musculoskeletal: No lower extremity edema. No deformities. Neurologic:  Normal speech and language.  No word finding difficulties.  No gross focal neurologic deficits are appreciated. Alert and oriented.  Face symmetric.  Tongue midline.  Cranial nerves II through XII intact. UE and LE strength 5/5 and symmetric. UE and LE SILT. NIHSS 0.  Skin: Skin is warm, dry and intact. No rash noted. Psychiatric: Mood and affect are appropriate for situation.  EKG  Personally reviewed.   Rate: 75 Rhythm: sinus Axis: borderline right Intervals: WNL Significant  baseline wander No STEMI    Radiology  CT: IMPRESSION:  1. No acute intracranial hemorrhage.  2. Age-related atrophy and chronic microvascular ischemic changes.    Procedures  Procedure(s) performed (including critical care):  Procedures   Initial Impression / Assessment and Plan / ED Course  68 y.o. female who presents to the ED for an episode of aphasia, now resolved, as above. On exam, she is without symptoms, NIHSS 0.  Ddx: CVA, TIA, complex migraine  Plan: labs, imaging, headache medication, reassess  CT imaging is negative, labs without significant derangements.  Discussed with patient admission for further work-up of TIA/CVA, as she has not had one in the past.  She is agreeable to this.  Discussed with hospitalist for admission.  Final Clinical Impression(s) / ED Diagnosis  Final diagnoses:  Aphasia       Note:  This document was prepared using Dragon voice recognition software and may include unintentional dictation errors.   Lilia Pro., MD 03/09/19 [phone]

## 2019-03-09 NOTE — Progress Notes (Signed)
Advanced Care Plan.  Purpose of Encounter: CODE STATUS. Parties in Attendance: The patient and me. Patient's Decisional Capacity: Yes. Medical Story: Glenda Boone  is a 67 y.o. female with a known history of hypertension, diabetes, COPD, hyperlipidemia, thyroid disease, and ovarian cancer. Plan:  Code Status: DNR. Other documents completed:  Time spent discussing advance care planning: 17 minutes.

## 2019-03-09 NOTE — H&P (Signed)
[place] [facility] [place] NAME: Glenda Boone    MR#:  [id number]  DATE OF BIRTH:  09-22-1950  DATE OF ADMISSION:  03/09/2019  PRIMARY CARE PHYSICIAN: [facility] [place]   REQUESTING/REFERRING PHYSICIAN: Dr. Joan Mayans.  CHIEF COMPLAINT:   Chief Complaint  Patient presents with  . AMS   Aphasia today HISTORY OF PRESENT ILLNESS:  Glenda Boone  is a 68 y.o. female with a known history of hypertension, diabetes, COPD, hyperlipidemia, thyroid disease, and oral cancer.  The patient presents the ED with above chief complaint.  She suddenly had difficulty word finding for about 5 minutes after lunch today.  She said she knew what she wanted but could not express.  She denies any headache, dizziness, loss of consciousness, seizure, syncope, slurred speech, dysphagia or incontinence.  She denies any focal weakness, numbness or tingling.  The patient sometimes has cough due to bronchitis.  CT of the head is unremarkable.  ED physician request admission. PAST MEDICAL HISTORY:   Past Medical History:  Diagnosis Date  . Anxiety   . Bronchitis   . COPD (chronic obstructive pulmonary disease) ([place])   . Depression   . Diabetes mellitus, type II ([place])   . Hyperlipidemia   . Hypertension   . Neuropathy   . Ovarian cancer ([place])   . Shingles   . Thyroid disease     PAST SURGICAL HISTORY:   Past Surgical History:  Procedure Laterality Date  . ABDOMINAL HYSTERECTOMY    . ANKLE SURGERY Left   . CHOLECYSTECTOMY    . OVARY SURGERY    . SMALL INTESTINE SURGERY    . TONSILLECTOMY      SOCIAL HISTORY:   Social History   Tobacco Use  . Smoking status: Current Every Day Smoker    Packs/day: 1.00    Types: Cigarettes    Start date: 12/19/1979  . Smokeless tobacco: Never Used  Substance Use Topics  . Alcohol use: No    Alcohol/week: 0.0 standard drinks    FAMILY HISTORY:   Family History  Problem Relation Age of Onset  . Heart  Problems Mother   . Hypertension Mother   . Colon cancer Father   . Arthritis-Osteo Sister     DRUG ALLERGIES:   Allergies  Allergen Reactions  . Codeine   . Demerol [Meperidine Hcl] Anxiety    REVIEW OF SYSTEMS:   Review of Systems  Constitutional: Negative for chills, fever and malaise/fatigue.  HENT: Negative for sore throat.   Eyes: Negative for blurred vision and double vision.  Respiratory: Positive for cough. Negative for hemoptysis, shortness of breath, wheezing and stridor.   Cardiovascular: Negative for chest pain, palpitations, orthopnea and leg swelling.  Gastrointestinal: Negative for abdominal pain, blood in stool, diarrhea, melena, nausea and vomiting.  Genitourinary: Negative for dysuria, flank pain and hematuria.  Musculoskeletal: Negative for back pain and joint pain.  Skin: Negative for rash.  Neurological: Positive for speech change. Negative for dizziness, tingling, tremors, sensory change, focal weakness, seizures, loss of consciousness, weakness and headaches.       Aphasia.  Endo/Heme/Allergies: Negative for polydipsia.  Psychiatric/Behavioral: Negative for depression. The patient is not nervous/anxious.     MEDICATIONS AT HOME:   Prior to Admission medications   Medication Sig Start Date End Date Taking? Authorizing Provider  atorvastatin (LIPITOR) 40 MG tablet Take by mouth.    [provider]  gabapentin (NEURONTIN) 400 MG capsule Take 1 capsule (400  mg total) by mouth at bedtime. 01/17/19   Rainey Pines, MD  hydrochlorothiazide (HYDRODIURIL) 25 MG tablet  06/30/17   [provider]  levothyroxine (SYNTHROID, LEVOTHROID) 200 MCG tablet  06/30/17   [provider]  levothyroxine (SYNTHROID, LEVOTHROID) 50 MCG tablet  06/30/17   [provider]  lisinopril (PRINIVIL,ZESTRIL) 20 MG tablet Take 20 mg by mouth.    [provider]  losartan (COZAAR) 50 MG tablet Take 50 mg by mouth daily. for high blood pressure  01/09/18   [provider]  metFORMIN (GLUCOPHAGE) 500 MG tablet Take by mouth.    [provider]  metFORMIN (GLUCOPHAGE-XR) 500 MG 24 hr tablet TAKE 2 TABLETS BY MOUTH TWICE DAILY FOR DIABETES 12/23/17   [provider]  PARoxetine (PAXIL) 20 MG tablet Take 3 tablets (60 mg total) by mouth daily. 01/17/19   Rainey Pines, MD  potassium chloride (K-DUR) 10 MEQ tablet Take by mouth.    [provider]  simvastatin (ZOCOR) 40 MG tablet Take by mouth.    [provider]  torsemide (DEMADEX) 10 MG tablet Take by mouth.    [provider]  triamcinolone (KENALOG) 0.025 % cream Reported on 06/21/2015 09/11/14   [provider]      VITAL SIGNS:  Blood pressure (!) 136/59, pulse 93, temperature 98.5 F (36.9 C), temperature source Oral, resp. rate 16, height 5\' 3"  (1.6 m), weight (!) 136.1 kg, SpO2 95 %.  PHYSICAL EXAMINATION:  Physical Exam  GENERAL:  68 y.o.-year-old patient lying in the bed with no acute distress.  Morbid obesity. EYES: Pupils equal, round, reactive to light and accommodation. No scleral icterus. Extraocular muscles intact.  HEENT: Head atraumatic, normocephalic. NECK:  Supple, no jugular venous distention. No thyroid enlargement, no tenderness.  LUNGS: Coarse sounds bilaterally, no wheezing, rales,rhonchi or crepitation. No use of accessory muscles of respiration.  CARDIOVASCULAR: S1, S2 normal. No murmurs, rubs, or gallops.  ABDOMEN: Soft, nontender, nondistended. Bowel sounds present. No organomegaly or mass.  EXTREMITIES: No pedal edema, cyanosis, or clubbing.  NEUROLOGIC: Cranial nerves II through XII are intact. Muscle strength 5/5 in all extremities. Sensation intact. Gait not checked.  PSYCHIATRIC: The patient is alert and oriented x 3.  SKIN: No obvious rash, lesion, or ulcer.   LABORATORY PANEL:   CBC Recent Labs  Lab 03/09/19 1600  WBC 12.4*  HGB 14.1  HCT 42.1  PLT 257    ------------------------------------------------------------------------------------------------------------------  Chemistries  Recent Labs  Lab 03/09/19 1600  NA 138  K 3.7  CL 102  CO2 26  GLUCOSE 142*  BUN 27*  CREATININE 1.03*  CALCIUM 9.3  AST 30  ALT 25  ALKPHOS 62  BILITOT 0.8   ------------------------------------------------------------------------------------------------------------------  Cardiac Enzymes No results for input(s): TROPONINI in the last 168 hours. ------------------------------------------------------------------------------------------------------------------  RADIOLOGY:  Ct Head Wo Contrast  Result Date: 03/09/2019 CLINICAL DATA:  68 year old female with aphasia. EXAM: CT HEAD WITHOUT CONTRAST TECHNIQUE: Contiguous axial images were obtained from the base of the skull through the vertex without intravenous contrast. COMPARISON:  None FINDINGS: Brain: There is mild age-related atrophy and moderate chronic microvascular ischemic changes. There is no acute intracranial hemorrhage. No mass effect or midline shift. No extra-axial fluid collection. Vascular: No hyperdense vessel or unexpected calcification. Skull: Normal. Negative for fracture or focal lesion. Sinuses/Orbits: No acute finding. Other: None IMPRESSION: 1. No acute intracranial hemorrhage. 2. Age-related atrophy and chronic microvascular ischemic changes. Electronically Signed   By: Laren Everts.D.  On: 03/09/2019 16:55      IMPRESSION AND PLAN:   Aphasia/TIA, rule out acute CVA. The patient will be placed for observation.  Neurochecks. Start aspirin and continue Lipitor, follow-up lipid panel, echocardiogram, carotid duplex and MRI of the brain.  Dehydration.  IV fluid support.  Hold torsemide.  Hypertension.  Continue home hypertension medication. Diabetes.  Start sliding scale. Morbid obesity.  Diet control and exercise advised.  Follow-up PCP. COPD.  Stable.  Robitussin as  needed for cough. Tobacco abuse.  Smoking cessation was counseled for 3 to 4 minutes, nicotine patch.  All the records are reviewed and case discussed with ED provider. Management plans discussed with the patient, family and they are in agreement.  CODE STATUS: DNR.  TOTAL TIME TAKING CARE OF THIS PATIENT: 45 minutes.    Demetrios Loll M.D on 03/09/2019 at 5:05 PM  Between 7am to 6pm - Pager - [phone]  After 6pm go to [email or web address] - [employer] [facility]  Office  [phone]  CC: Primary care physician; [facility]   Note: This dictation was prepared with Dragon dictation along with smaller phrase technology. Any transcriptional errors that result from this process are unin

## 2019-03-09 NOTE — Progress Notes (Signed)
PHARMACIST - PHYSICIAN COMMUNICATION  CONCERNING:  Enoxaparin (Lovenox) for DVT Prophylaxis    RECOMMENDATION: Patient was prescribed enoxaparin 40mg  q24 hours for VTE prophylaxis.   Filed Weights   03/09/19 1551  Weight: (!) 300 lb 0.7 oz (136.1 kg)    Body mass index is 53.15 kg/m.  Estimated Creatinine Clearance: 69.9 mL/min (A) (by C-G formula based on SCr of 1.03 mg/dL (H)).   Based on [place] patient is candidate for enoxaparin 40mg  every 12 hour dosing due to BMI being >40.   DESCRIPTION: Pharmacy has adjusted enoxaparin dose per [facility] policy.  Patient is now receiving enoxaparin 40mg  every 12 hours.    Tawnya Crook, PharmD Clinical Pharmacist  03/09/2019 6:23 PM

## 2019-03-09 NOTE — ED Triage Notes (Signed)
Arrives from home via [facility]>  Had an episode 2 hours ago -- after eating a spicy burrito, had a brief episode where she was unable to 'form the word but could not get them out".  States during episode she was aware of what was going on around her.  Symptoms then resolved and have not returned.  Denies symptoms in the past.  Also reports having bronchitis at this time--not on antibiotics.

## 2019-03-09 NOTE — ED Notes (Signed)
Patient transported to CT 

## 2019-03-09 NOTE — Progress Notes (Signed)
Patient refuses bed alarm. Patient educated on the importance of calling for help when needing to get up. Patient denied the need for assistance and states "I'm fine by myself". A&Ox4. VS stable. Will continue to monitor.

## 2019-03-10 ENCOUNTER — Observation Stay (HOSPITAL_BASED_OUTPATIENT_CLINIC_OR_DEPARTMENT_OTHER)
Admit: 2019-03-10 | Discharge: 2019-03-10 | Disposition: A | Discharge status: Home / Self Care | Payer: Medicare HMO | Attending: Internal Medicine | Admitting: Internal Medicine

## 2019-03-10 ENCOUNTER — Observation Stay: Payer: Medicare HMO

## 2019-03-10 DIAGNOSIS — I361 Nonrheumatic tricuspid (valve) insufficiency: Secondary | ICD-10-CM

## 2019-03-10 DIAGNOSIS — R4701 Aphasia: Secondary | ICD-10-CM | POA: Diagnosis not present

## 2019-03-10 LAB — GLUCOSE, CAPILLARY: Glucose-Capillary: 87 mg/dL (ref 70–99)

## 2019-03-10 LAB — ECHOCARDIOGRAM COMPLETE
Height: 63 in
Weight: 4800.74 oz

## 2019-03-10 LAB — LIPID PANEL
Cholesterol: 129 mg/dL (ref 0–200)
HDL: 33 mg/dL — ABNORMAL LOW (ref >40)
LDL Cholesterol: 50 mg/dL (ref 0–99)
Total CHOL/HDL Ratio: 3.9 RATIO
Triglycerides: 230 mg/dL — ABNORMAL HIGH (ref <150)
VLDL: 46 mg/dL — ABNORMAL HIGH (ref 0–40)

## 2019-03-10 LAB — SARS CORONAVIRUS 2 (TAT 6-24 HRS): SARS Coronavirus 2: NEGATIVE

## 2019-03-10 LAB — HIV ANTIBODY (ROUTINE TESTING W REFLEX): HIV Screen 4th Generation wRfx: NONREACTIVE

## 2019-03-10 MED ORDER — ASPIRIN BUF(CACARB-MGCARB-MGO) 81 MG PO TABS: 1.0000 | ORAL_TABLET | Freq: Every morning | ORAL | 0 refills | Status: AC

## 2019-03-10 MED ORDER — LEVOTHYROXINE SODIUM 50 MCG PO TABS: 275.0000 ug | ORAL_TABLET | Freq: Every day | ORAL | Status: DC

## 2019-03-10 MED ORDER — ASPIRIN 81 MG PO CHEW
CHEWABLE_TABLET | ORAL | Status: AC
  Administered 2019-03-10: 324 mg
  Filled 2019-03-10: qty 4

## 2019-03-10 MED ORDER — HYDROCHLOROTHIAZIDE 25 MG PO TABS: 25.0000 mg | ORAL_TABLET | Freq: Every day | ORAL | Status: DC

## 2019-03-10 MED ORDER — PAROXETINE HCL 20 MG PO TABS
40.0000 mg | ORAL_TABLET | Freq: Every day | ORAL | Status: DC
  Administered 2019-03-10: 40 mg via ORAL
  Filled 2019-03-10: qty 2

## 2019-03-10 MED ORDER — LOSARTAN POTASSIUM 50 MG PO TABS
100.0000 mg | ORAL_TABLET | Freq: Every day | ORAL | Status: DC
  Administered 2019-03-10: 12:00:00 100 mg via ORAL
  Filled 2019-03-10: qty 2

## 2019-03-10 NOTE — Progress Notes (Signed)
Patient discharged to home. Patient transported via POV by sister Anderson [place]. Patient received discharge paperwork. Discharge paperwork reviewed with patient and patient verbalized understanding.

## 2019-03-10 NOTE — Progress Notes (Signed)
*  PRELIMINARY RESULTS* Echocardiogram 2D Echocardiogram has been performed.  Sherrie Sport 03/10/2019, 9:31 AM

## 2019-03-10 NOTE — Evaluation (Signed)
Physical Therapy Evaluation Patient Details Name: Glenda Boone MRN: [id number] DOB: 06-02-1950 Today's Date: 03/10/2019   History of Present Illness  68 y.o. female with a known history of hypertension, diabetes, COPD, hyperlipidemia, thyroid disease, and oral cancer.  The patient presented to the ED with sudden onset difficulty word finding for about 5 minutes.  At time of PT exam she is reporting no change from baseline apart from being tired of laying in bed.   Clinical Impression  Pt reports she has felt essentially back to her baseline since arrival.  Her biggest issue with mobility, etc this date is her chronic R hip pain.  She uses a [facility] and is rarely out of the home but even at this is limited with how long she can tolerate standing. She did notably better with trial of FWW (has 4WW at home that she does not feel comfortable with) and she was amazed at how much better she was able to do with weight distributed though walker.  She should be safe to return home with assist from daughter, will benefit from Bruno and a front wheeled walker.    Follow Up Recommendations Home health PT    Equipment Recommendations  Rolling walker with 5" wheels    Recommendations for Other Services       Precautions / Restrictions Precautions Precautions: Fall Restrictions Weight Bearing Restrictions: No      Mobility  Bed Mobility Overal bed mobility: Independent             General bed mobility comments: Pt able to get herself to EOB and don socks w/o assist  Transfers Overall transfer level: Modified independent Equipment used: Straight cane;Rolling walker (2 wheeled)             General transfer comment: Pt is able to rise to standing without direct assist but c/o chronic R hip pain that necessiates excessive use of AD.  Ambulation/Gait Ambulation/Gait assistance: Supervision Gait Distance (Feet): 65 Feet Assistive device: Rolling walker (2 wheeled);Straight cane        General Gait Details: First trial with SPC (pt's baseline) with hesitant, limping, slow gait.  She reports she has a 4WW at home that has not felt steady - trial with FWW and pt did very well with it and was surprised about home much better and different she felt using it.  Definite improvement in tolerance and gait pattern.  Stairs            Wheelchair Mobility    Modified Rankin (Stroke Patients Only)       Balance Overall balance assessment: Modified Independent                                           Pertinent Vitals/Pain Pain Assessment: No/denies pain    Home Living Family/patient expects to be discharged to:: Private residence Living Arrangements: Alone Available Help at Discharge: Family(daughter will be staying with her 24/7 initially) Type of Home: House                Prior Function Level of Independence: Independent with assistive device(s)         Comments: Pt uses cane at baseline, rarely out of the home but able to manage in the home.     Hand Dominance        Extremity/Trunk Assessment   Upper Extremity Assessment Upper Extremity  Assessment: Overall WFL for tasks assessed(has some chronic R UE weakness, WFL)    Lower Extremity Assessment Lower Extremity Assessment: Overall WFL for tasks assessed(b/l distal neuropathy with functional strength t/o)       Communication   Communication: No difficulties  Cognition Arousal/Alertness: Awake/alert Behavior During Therapy: WFL for tasks assessed/performed Overall Cognitive Status: Within Functional Limits for tasks assessed                                        General Comments      Exercises     Assessment/Plan    PT Assessment Patient needs continued PT services  PT Problem List Decreased strength;Decreased range of motion;Decreased activity tolerance;Decreased balance;Decreased mobility;Decreased coordination;Decreased cognition;Decreased  knowledge of use of DME;Decreased safety awareness       PT Treatment Interventions DME instruction;Gait training;Functional mobility training;Therapeutic activities;Therapeutic exercise;Neuromuscular re-education;Balance training;Patient/family education;Cognitive remediation    PT Goals (Current goals can be found in the Care Plan section)  Acute Rehab PT Goals Patient Stated Goal: go home PT Goal Formulation: With patient Time For Goal Achievement: 03/24/19 Potential to Achieve Goals: Fair    Frequency Min 2X/week   Barriers to discharge        Co-evaluation               AM-PAC PT "6 Clicks" Mobility  Outcome Measure Help needed turning from your back to your side while in a flat bed without using bedrails?: None Help needed moving from lying on your back to sitting on the side of a flat bed without using bedrails?: None Help needed moving to and from a bed to a chair (including a wheelchair)?: None Help needed standing up from a chair using your arms (e.g., wheelchair or bedside chair)?: None Help needed to walk in hospital room?: None Help needed climbing 3-5 steps with a railing? : A Little 6 Click Score: 23    End of Session Equipment Utilized During Treatment: Gait belt Activity Tolerance: Patient tolerated treatment well;Patient limited by pain Patient left: in chair;with call bell/phone within reach;with nursing/sitter in room Nurse Communication: Mobility status PT Visit Diagnosis: Muscle weakness (generalized) (M62.81);Pain;Difficulty in walking, not elsewhere classified (R26.2) Pain - Right/Left: Right Pain - part of body: Hip    Time: [id number] PT Time Calculation (min) (ACUTE ONLY): 28 min   Charges:   PT Evaluation $PT Eval Low Complexity: 1 Low PT Treatments $Gait Training: 8-22 mins        Kreg Shropshire, DPT 03/10/2019, 2:03 PM

## 2019-03-10 NOTE — Consult Note (Signed)
Reason for Consult: aphasia  Referring Physician: Dr. Manuella Ghazi   CC: aphasia   HPI: Glenda Boone is an 68 y.o. female with a known history of hypertension, diabetes, COPD, hyperlipidemia, thyroid disease, and oral cancer.  The patient presents the ED with difficulty word finding for about 5 minutes after lunch yesterday. Symptoms resolved and back to baseline.    Past Medical History:  Diagnosis Date  . Anxiety   . Bronchitis   . COPD (chronic obstructive pulmonary disease) ([place])   . Depression   . Diabetes mellitus, type II ([place])   . Hyperlipidemia   . Hypertension   . Neuropathy   . Ovarian cancer ([place])   . Shingles   . Thyroid disease     Past Surgical History:  Procedure Laterality Date  . ABDOMINAL HYSTERECTOMY    . ANKLE SURGERY Left   . CHOLECYSTECTOMY    . OVARY SURGERY    . SMALL INTESTINE SURGERY    . TONSILLECTOMY      Family History  Problem Relation Age of Onset  . Heart Problems Mother   . Hypertension Mother   . Colon cancer Father   . Arthritis-Osteo Sister     Social History:  reports that she has been smoking cigarettes. She started smoking about 39 years ago. She has been smoking about 1.00 pack per day. She has never used smokeless tobacco. She reports that she does not drink alcohol or use drugs.  Allergies  Allergen Reactions  . Codeine   . Demerol [Meperidine Hcl] Anxiety    Medications: I have reviewed the patient's current medications.  ROS: History obtained from the patient  General ROS: negative for - chills, fatigue, fever, night sweats, weight gain or weight loss Psychological ROS: negative for - behavioral disorder, hallucinations, memory difficulties, mood swings or suicidal ideation Ophthalmic ROS: negative for - blurry vision, double vision, eye pain or loss of vision ENT ROS: negative for - epistaxis, nasal discharge, oral lesions, sore throat, tinnitus or vertigo Allergy and Immunology ROS: negative for - hives or itchy/watery  eyes Hematological and Lymphatic ROS: negative for - bleeding problems, bruising or swollen lymph nodes Endocrine ROS: negative for - galactorrhea, hair pattern changes, polydipsia/polyuria or temperature intolerance Respiratory ROS: negative for - cough, hemoptysis, shortness of breath or wheezing Cardiovascular ROS: negative for - chest pain, dyspnea on exertion, edema or irregular heartbeat Gastrointestinal ROS: negative for - abdominal pain, diarrhea, hematemesis, nausea/vomiting or stool incontinence Genito-Urinary ROS: negative for - dysuria, hematuria, incontinence or urinary frequency/urgency Musculoskeletal ROS: negative for - joint swelling or muscular weakness Neurological ROS: as noted in HPI Dermatological ROS: negative for rash and skin lesion changes  Physical Examination: Blood pressure (!) 149/79, pulse 79, temperature 97.8 F (36.6 C), temperature source Oral, resp. rate 18, height 5\' 3"  (1.6 m), weight (!) 136.1 kg, SpO2 94 %.    Neurological Examination   Mental Status: Alert, oriented, thought content appropriate.  Speech fluent without evidence of aphasia.  Able to follow 3 step commands without difficulty. Cranial Nerves: II: Discs flat bilaterally; Visual fields grossly normal, pupils equal, round, reactive to light and accommodation III,IV, VI: ptosis not present, extra-ocular motions intact bilaterally V,VII: smile symmetric, facial light touch sensation normal bilaterally VIII: hearing normal bilaterally IX,X: gag reflex present XI: bilateral shoulder shrug XII: midline tongue extension Motor: Right : Upper extremity   5/5    Left:     Upper extremity   5/5  Lower extremity   5/5  Lower extremity   5/5 Tone and bulk:normal tone throughout; no atrophy noted Sensory: Pinprick and light touch intact throughout, bilaterally Deep Tendon Reflexes: 2+ and symmetric throughout Plantars: Right: downgoing   Left: downgoing Cerebellar: normal Tuckerman-to-nose,  normal rapid alternating movements and normal heel-to-shin test Gait: not tested      Laboratory Studies:   Basic Metabolic Panel: Recent Labs  Lab 03/09/19 1600  NA 138  K 3.7  CL 102  CO2 26  GLUCOSE 142*  BUN 27*  CREATININE 1.03*  CALCIUM 9.3    Liver Function Tests: Recent Labs  Lab 03/09/19 1600  AST 30  ALT 25  ALKPHOS 62  BILITOT 0.8  PROT 7.0  ALBUMIN 3.8   No results for input(s): LIPASE, AMYLASE in the last 168 hours. No results for input(s): AMMONIA in the last 168 hours.  CBC: Recent Labs  Lab 03/09/19 1600  WBC 12.4*  NEUTROABS 9.3*  HGB 14.1  HCT 42.1  MCV 95.7  PLT 257    Cardiac Enzymes: No results for input(s): CKTOTAL, CKMB, CKMBINDEX, TROPONINI in the last 168 hours.  BNP: Invalid input(s): POCBNP  CBG: Recent Labs  Lab 03/09/19 1835 03/09/19 2032 03/10/19 0722  GLUCAP 97 132* 87    Microbiology: Results for orders placed or performed during the hospital encounter of 03/09/19  SARS CORONAVIRUS 2 (TAT 6-24 HRS) Nasopharyngeal Nasopharyngeal Swab     Status: None   Collection Time: 03/09/19  5:19 PM   Specimen: Nasopharyngeal Swab  Result Value Ref Range Status   SARS Coronavirus 2 NEGATIVE NEGATIVE Final    Comment: (NOTE) SARS-CoV-2 target nucleic acids are NOT DETECTED. The SARS-CoV-2 RNA is generally detectable in upper and lower respiratory specimens during the acute phase of infection. Negative results do not preclude SARS-CoV-2 infection, do not rule out co-infections with other pathogens, and should not be used as the sole basis for treatment or other patient management decisions. Negative results must be combined with clinical observations, patient history, and epidemiological information. The expected result is Negative. Fact Sheet for Patients: SugarRoll.be Fact Sheet for Healthcare Providers: https://www.woods-mathews.com/ This test is not yet approved or cleared by  the [place] FDA and  has been authorized for detection and/or diagnosis of SARS-CoV-2 by FDA under an Emergency Use Authorization (EUA). This EUA will remain  in effect (meaning this test can be used) for the duration of the COVID-19 declaration under Section 56 4(b)(1) of the Act, 21 U.S.C. section 360bbb-3(b)(1), unless the authorization is terminated or revoked sooner. Performed at [facility], [place] [address], [place], [place] [address]     Coagulation Studies: Recent Labs    03/09/19 1600  LABPROT 12.9  INR 1.0    Urinalysis:  Recent Labs  Lab 03/09/19 2001  COLORURINE YELLOW*  LABSPEC 1.021  PHURINE 5.0  GLUCOSEU NEGATIVE  HGBUR NEGATIVE  BILIRUBINUR NEGATIVE  KETONESUR NEGATIVE  PROTEINUR NEGATIVE  NITRITE NEGATIVE  LEUKOCYTESUR NEGATIVE    Lipid Panel:     Component Value Date/Time   CHOL 129 03/10/2019 0425   TRIG 230 (H) 03/10/2019 0425   HDL 33 (L) 03/10/2019 0425   CHOLHDL 3.9 03/10/2019 0425   VLDL 46 (H) 03/10/2019 0425   LDLCALC 50 03/10/2019 0425    HgbA1C:  Lab Results  Component Value Date   HGBA1C 5.6 03/09/2019    Urine Drug Screen:  No results found for: LABOPIA, COCAINSCRNUR, LABBENZ, AMPHETMU, THCU, LABBARB  Alcohol Level: No results for input(s): ETH in the last  168 hours.  Other results: EKG: normal EKG, normal sinus rhythm, unchanged from previous tracings.  Imaging: Ct Head Wo Contrast  Result Date: 03/09/2019 CLINICAL DATA:  68 year old female with aphasia. EXAM: CT HEAD WITHOUT CONTRAST TECHNIQUE: Contiguous axial images were obtained from the base of the skull through the vertex without intravenous contrast. COMPARISON:  None FINDINGS: Brain: There is mild age-related atrophy and moderate chronic microvascular ischemic changes. There is no acute intracranial hemorrhage. No mass effect or midline shift. No extra-axial fluid collection. Vascular: No hyperdense vessel or unexpected calcification. Skull: Normal.  Negative for fracture or focal lesion. Sinuses/Orbits: No acute finding. Other: None IMPRESSION: 1. No acute intracranial hemorrhage. 2. Age-related atrophy and chronic microvascular ischemic changes. Electronically Signed   By: Anner Crete M.D.   On: 03/09/2019 16:55   Mr Brain Wo Contrast  Result Date: 03/09/2019 CLINICAL DATA:  TIA. 5 minutes episode of acute onset of word-finding difficulties. Personal history of diabetes, hypertension hyperlipidemia, ovarian cancer, and COPD. EXAM: MRI HEAD WITHOUT CONTRAST TECHNIQUE: Multiplanar, multiecho pulse sequences of the brain and surrounding structures were obtained without intravenous contrast. COMPARISON:  CT head without contrast 03/09/2019 FINDINGS: Brain: Periventricular and subcortical T2 hyperintensities are moderately advanced for age. A remote lacunar infarct is evident within the right thalamus. White matter changes extend into the brainstem. Remote lacunar infarct is present posteriorly in the left cerebellum. The ventricles are of normal size. No significant extraaxial fluid collection is present. Vascular: Flow is present in the major intracranial arteries. Skull and upper cervical spine: The craniocervical junction is normal. Upper cervical spine is within normal limits. Marrow signal is unremarkable. The sella is expanded. Sinuses/Orbits: The paranasal sinuses and mastoid air cells are clear. Mild exophthalmos is present. No mass lesion is present. Globes are within normal limits bilaterally. IMPRESSION: 1. No acute intracranial abnormality. 2. Periventricular and subcortical white matter disease bilaterally is moderately advanced for age, reflecting the sequela of chronic microvascular ischemia. 3. 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 Electronically Signed   By: San Morelle M.D.   On: 03/09/2019 22:02     Assessment/Plan:  68 y.o. female with a known history of hypertension, diabetes, COPD,  hyperlipidemia, thyroid disease, and oral cancer.  The patient presents the ED with difficulty word finding for about 5 minutes after lunch yesterday. Symptoms resolved and back to baseline.    - MRI no acute abnormalities  - Back to baseline - Start ASA 81mg  daily  - In [place] - d/c later today Dishawn Bhargava  03/10/2019, 10:39 AM

## 2019-03-10 NOTE — TOC Initial Note (Signed)
Transition of Care [facility]) - Initial/Assessment Note    Patient Details  Name: Glenda Boone MRN: [id number] Date of Birth: [birth year]  [facility]) CM/SW Contact:    Shelbie Hutching, RN Phone Number: 03/10/2019, 1:47 PM  Clinical Narrative:                 Patient placed in observation for aphasia.  Patient has been medically cleared for discharge and will discharge home with home health PT through [facility].  Patient's daughter will be providing transportation for patient home and the daughter will be staying with the patient for a while.  Patient has been living alone and independent in ADL's.  PT recommended home health and a walker.  Rolling walker will be brought up to the patient's room before discharge.   No other discharge needs identified at this time.    Expected Discharge Plan: [place] Barriers to Discharge: Barriers Resolved   Patient Goals and CMS Choice Patient states their goals for this hospitalization and ongoing recovery are:: Ready to go home CMS Medicare.gov Compare Post Acute Care list provided to:: Patient Choice offered to / list presented to : Patient  Expected Discharge Plan and Services Expected Discharge Plan: [place]   Discharge Planning Services: CM Consult Post Acute Care Choice: [place] arrangements for the past 2 months: Single Family Home Expected Discharge Date: 03/10/19               DME Arranged: [place]ford Rile rolling DME Agency: [facility] Date DME Agency Contacted: 03/10/19 Time DME Agency Contacted: 1200 Representative spoke with at DME Agency: Karlyn Agee HH Arranged: PT [place]: [place] Date [place]: 03/10/19 Time [place]: 26 Representative spoke with at [place]: Sharmon Revere  Prior Living Arrangements/Services Living arrangements for the past 2 months: [place] with:: Self Patient language and need for  interpreter reviewed:: No Do you feel safe going back to the place where you live?: Yes      Need for Family Participation in Patient Care: Yes (Comment)(chronic medical conditions) Care giver support system in place?: Yes (comment)(daughter)   Criminal Activity/Legal Involvement Pertinent to Current Situation/Hospitalization: No - Comment as needed  Activities of Daily Living Home Assistive Devices/Equipment: Cane (specify quad or straight)(straight cane ) ADL Screening (condition at time of admission) Patient's cognitive ability adequate to safely complete daily activities?: Yes Is the patient deaf or have difficulty hearing?: No Does the patient have difficulty seeing, even when wearing glasses/contacts?: No Does the patient have difficulty concentrating, remembering, or making decisions?: No Patient able to express need for assistance with ADLs?: Yes Does the patient have difficulty dressing or bathing?: No Independently performs ADLs?: Yes (appropriate for developmental age) Does the patient have difficulty walking or climbing stairs?: Yes Weakness of Legs: None Weakness of Arms/Hands: None  Permission Sought/Granted Permission sought to share information with : Case Manager, Other (comment) Permission granted to share information with : Yes, Verbal Permission Granted     Permission granted to share info w AGENCY: [facility]        Emotional Assessment Appearance:: Appears stated age Attitude/Demeanor/Rapport: Engaged Affect (typically observed): Accepting Orientation: : Oriented to Self, Oriented to Place, Oriented to  Time, Oriented to Situation Alcohol / Substance Use: Not Applicable Psych Involvement: No (comment)  Admission diagnosis:  Aphasia [R47.01] Aphagia [R13.0] Patient Active Problem List   Diagnosis Date Noted  . Aphagia 03/09/2019  .  Shingles outbreak 06/21/2015  . Chronic bronchitis ([place]) 06/21/2015  . 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[place]) 11/28/2014   . H/O diabetes mellitus 11/28/2014  . Anxiety, generalized 11/28/2014  . H/O hypercholesterolemia 11/28/2014  . H/O: HTN (hypertension) 11/28/2014  . H/O: hypothyroidism 11/28/2014  . Depression, major, recurrent, moderate ([place]) 11/28/2014  . H/O: obesity 11/28/2014  . H/O disease 11/28/2014  . Neuropathy 11/28/2014   PCP:  Center, [place]:   [facility] [address], [place] [address] Phone: [phone] Fax: [place] ([place]), [place] - [place]) [place] [address] Phone: [phone] Fax: [phone]     Social Determinants of Health (SDOH) Interventions    Readmission Risk Interventions No flowsheet data found.

## 2019-03-10 NOTE — TOC Transition Note (Signed)
Transition of Care [facility]) - CM/SW Discharge Note   Patient Details  Name: MINYON TOW MRN: [id number] Date of Birth: 22-Aug-1950  [facility] CM/SW Contact:  Shelbie Hutching, RN Phone Number: 03/10/2019, 1:50 PM   Clinical Narrative:     Discharging home with home health.  Daughter to pick up.   Final next level of care: [place] Barriers to Discharge: Barriers Resolved   Patient Goals and CMS Choice Patient states their goals for this hospitalization and ongoing recovery are:: Ready to go home CMS Medicare.gov Compare Post Acute Care list provided to:: Patient Choice offered to / list presented to : Patient  Discharge Placement                       Discharge Plan and Services   Discharge Planning Services: CM Consult Post Acute Care Choice: Home Health          DME Arranged: Walker rolling DME Agency: [facility] Date DME Agency Contacted: 03/10/19 Time DME Agency Contacted: 1200 Representative spoke with at DME Agency: Karlyn Agee HH Arranged: PT [place]: [place] Date [place]: 03/10/19 Time [place]: [place] Representative spoke with at [place]: [place] ([place]) Interventions     Readmission Risk Interventions No flowsheet data found.

## 2019-03-10 NOTE — Progress Notes (Signed)
OT Cancellation Note  Patient Details Name: Glenda Boone MRN: [id number] DOB: [birth year]   Cancelled Treatment:    Reason Eval/Treat Not Completed: Patient at procedure or test/ unavailable  Order received and chart reviewed.  Pt not in room and at [place] and not available for OT Evaluation.  Will attempt again later today.  Thank you for the referral.  Chrys Racer, OTR/L, [place] [id number] 03/10/19, 10:22 AM

## 2019-03-10 NOTE — Progress Notes (Signed)
Chart reviewed. Pt speech is back to baseline with no evidence of Aphasia per Neurology notes. If Pt is discharged prior to ST screening, rec f/u and report to PCP if any further Speech and Language deficits for OP ST consult.

## 2019-03-10 NOTE — Discharge Summary (Signed)
[facility] [place] NAME: Glenda Boone    MR#:  [id number]  DATE OF BIRTH:  25-Oct-1950  DATE OF ADMISSION:  03/09/2019   ADMITTING PHYSICIAN: Demetrios Loll, MD  DATE OF DISCHARGE: 03/10/2019  3:34 PM  PRIMARY CARE PHYSICIAN: [facility] [place]   ADMISSION DIAGNOSIS:  Aphasia [R47.01] Aphagia [R13.0] DISCHARGE DIAGNOSIS:  Active Problems:   Aphagia  SECONDARY DIAGNOSIS:   Past Medical History:  Diagnosis Date  . Anxiety   . Bronchitis   . COPD (chronic obstructive pulmonary disease) ([place])   . Depression   . Diabetes mellitus, type II ([place])   . Hyperlipidemia   . Hypertension   . Neuropathy   . Ovarian cancer ([place])   . Shingles   . Thyroid disease    HOSPITAL COURSE:  68 y.o. female with a known history of hypertension, diabetes, COPD, hyperlipidemia,thyroid disease, and oral cancer.The patient presents the ED with difficulty word finding for about 5 minutes after lunch yesterday. Symptoms resolved and back to baseline.    - MRI no acute abnormalities  - Back to baseline - Start ASA 81mg  daily  - US carotid with no hemodynamically significant stenosis DISCHARGE CONDITIONS:  stable CONSULTS OBTAINED:  Treatment Team:  Leotis Pain, MD DRUG ALLERGIES:   Allergies  Allergen Reactions  . Codeine   . Demerol [Meperidine Hcl] Anxiety   DISCHARGE MEDICATIONS:   Allergies as of 03/10/2019      Reactions   Codeine    Demerol [meperidine Hcl] Anxiety      Medication List    TAKE these medications   Aspirin Buf(CaCarb-MgCarb-MgO) 81 MG Tabs Take 1 tablet (81 mg total) by mouth every morning.   gabapentin 400 MG capsule Commonly known as: NEURONTIN Take 1 capsule (400 mg total) by mouth at bedtime.   hydrochlorothiazide 25 MG tablet Commonly known as: HYDRODIURIL Take 25 mg by mouth daily.   levothyroxine 200 MCG tablet Commonly known as: SYNTHROID Take 200 mcg by mouth daily before  breakfast. (take with 15mcg tablet to equal total of 227mcg daily)   levothyroxine 75 MCG tablet Commonly known as: SYNTHROID Take 75 mcg by mouth daily before breakfast. (take with 258mcg tablet to total 283mcg daily)   losartan 100 MG tablet Commonly known as: COZAAR Take 100 mg by mouth daily. for high blood pressure   metFORMIN 500 MG 24 hr tablet Commonly known as: GLUCOPHAGE-XR Take 1,000 mg by mouth daily with breakfast.   PARoxetine 20 MG tablet Commonly known as: PAXIL Take 3 tablets (60 mg total) by mouth daily.   simvastatin 40 MG tablet Commonly known as: ZOCOR Take 40 mg by mouth daily.        DISCHARGE INSTRUCTIONS:   DIET:  Regular diet DISCHARGE CONDITION:  Good ACTIVITY:  Activity as tolerated OXYGEN:  Home Oxygen: No.  Oxygen Delivery: room air DISCHARGE LOCATION:  home   If you experience worsening of your admission symptoms, develop shortness of breath, life threatening emergency, suicidal or homicidal thoughts you must seek medical attention immediately by calling 911 or calling your MD immediately  if symptoms less severe.  You Must read complete instructions/literature along with all the possible adverse reactions/side effects for all the Medicines you take and that have been prescribed to you. Take any new Medicines after you have completely understood and accpet all the possible adverse reactions/side effects.   Please note  You were cared for by a hospitalist during your hospital stay.  If you have any questions about your discharge medications or the care you received while you were in the hospital after you are discharged, you can call the unit and asked to speak with the hospitalist on call if the hospitalist that took care of you is not available. Once you are discharged, your primary care physician will handle any further medical issues. Please note that NO REFILLS for any discharge medications will be authorized once you are discharged, as  it is imperative that you return to your primary care physician (or establish a relationship with a primary care physician if you do not have one) for your aftercare needs so that they can reassess your need for medications and monitor your lab values.    On the day of Discharge:  VITAL SIGNS:  Blood pressure (!) 171/97, pulse 78, temperature 98.4 F (36.9 C), temperature source Oral, resp. rate 19, height 5\' 3"  (1.6 m), weight (!) 136.1 kg, SpO2 99 %. PHYSICAL EXAMINATION:  GENERAL:  68 y.o.-year-old patient lying in the bed with no acute distress.  EYES: Pupils equal, round, reactive to light and accommodation. No scleral icterus. Extraocular muscles intact.  HEENT: Head atraumatic, normocephalic. Oropharynx and nasopharynx clear.  NECK:  Supple, no jugular venous distention. No thyroid enlargement, no tenderness.  LUNGS: Normal breath sounds bilaterally, no wheezing, rales,rhonchi or crepitation. No use of accessory muscles of respiration.  CARDIOVASCULAR: S1, S2 normal. No murmurs, rubs, or gallops.  ABDOMEN: Soft, non-tender, non-distended. Bowel sounds present. No organomegaly or mass.  EXTREMITIES: No pedal edema, cyanosis, or clubbing.  NEUROLOGIC: Cranial nerves II through XII are intact. Muscle strength 5/5 in all extremities. Sensation intact. Gait not checked.  PSYCHIATRIC: The patient is alert and oriented x 3.  SKIN: No obvious rash, lesion, or ulcer.  DATA REVIEW:   CBC Recent Labs  Lab 03/09/19 1600  WBC 12.4*  HGB 14.1  HCT 42.1  PLT 257    Chemistries  Recent Labs  Lab 03/09/19 1600  NA 138  K 3.7  CL 102  CO2 26  GLUCOSE 142*  BUN 27*  CREATININE 1.03*  CALCIUM 9.3  AST 30  ALT 25  ALKPHOS 62  BILITOT 0.8      RADIOLOGY:  Mr Brain Wo Contrast  Result Date: 03/09/2019 CLINICAL DATA:  TIA. 5 minutes episode of acute onset of word-finding difficulties. Personal history of diabetes, hypertension hyperlipidemia, ovarian cancer, and COPD. EXAM: MRI  HEAD WITHOUT CONTRAST TECHNIQUE: Multiplanar, multiecho pulse sequences of the brain and surrounding structures were obtained without intravenous contrast. COMPARISON:  CT head without contrast 03/09/2019 FINDINGS: Brain: Periventricular and subcortical T2 hyperintensities are moderately advanced for age. A remote lacunar infarct is evident within the right thalamus. White matter changes extend into the brainstem. Remote lacunar infarct is present posteriorly in the left cerebellum. The ventricles are of normal size. No significant extraaxial fluid collection is present. Vascular: Flow is present in the major intracranial arteries. Skull and upper cervical spine: The craniocervical junction is normal. Upper cervical spine is within normal limits. Marrow signal is unremarkable. The sella is expanded. Sinuses/Orbits: The paranasal sinuses and mastoid air cells are clear. Mild exophthalmos is present. No mass lesion is present. Globes are within normal limits bilaterally. IMPRESSION: 1. No acute intracranial abnormality. 2. Periventricular and subcortical white matter disease bilaterally is moderately advanced for age, reflecting the sequela of chronic microvascular ischemia. 3. 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 Electronically Signed   By: San Morelle  M.D.   On: 03/09/2019 22:02   US Carotid Bilateral (at [facility] Only)  Result Date: 03/10/2019 CLINICAL DATA:  History of hypertension, hyperlipidemia, diabetes and smoking. EXAM: BILATERAL CAROTID DUPLEX ULTRASOUND TECHNIQUE: Pearline Cables scale imaging, color Doppler and duplex ultrasound were performed of bilateral carotid and vertebral arteries in the neck. COMPARISON:  None. FINDINGS: Criteria: Quantification of carotid stenosis is based on velocity parameters that correlate the residual internal carotid diameter with NASCET-based stenosis levels, using the diameter of the distal internal carotid lumen as the  denominator for stenosis measurement. The following velocity measurements were obtained: RIGHT ICA: 56/15 cm/sec CCA: [id number] cm/sec SYSTOLIC ICA/CCA RATIO:  0.9 ECA: 104 cm/sec LEFT ICA: 92/24 cm/sec CCA: [id number] cm/sec SYSTOLIC ICA/CCA RATIO:  1.5 ECA: 75 cm/sec RIGHT CAROTID ARTERY: There is a moderate amount of eccentric echogenic plaque within the right carotid bulb (image 16), extending to involve the origin and proximal aspects of the right internal carotid artery (image 22), not resulting in elevated peak systolic velocities within the interrogated course the right internal carotid artery to suggest a hemodynamically significant stenosis RIGHT VERTEBRAL ARTERY:  Antegrade flow LEFT CAROTID ARTERY: There is a minimal amount of eccentric echogenic plaque within the left carotid bulb (image 47), extending to involve the origin and proximal aspects of the left internal carotid artery (image 53), not resulting in elevated peak systolic velocities within the interrogated course the left internal carotid artery to suggest a hemodynamically significant stenosis. LEFT VERTEBRAL ARTERY:  Antegrade flow IMPRESSION: Minimal to moderate amount of bilateral atherosclerotic plaque, right greater than left, not resulting in a hemodynamically significant stenosis within either internal carotid artery. Electronically Signed   By: Sandi Mariscal M.D.   On: 03/10/2019 11:16     Management plans discussed with the patient, family and they are in agreement.  CODE STATUS: Prior   TOTAL TIME TAKING CARE OF THIS PATIENT: 45 minutes.    Max Sane M.D on 03/10/2019 at 9:12 PM  Between 7am to 6pm - Pager - [phone]  After 6pm go to [email or web address] - Technical brewer [facility]  Office  [phone]  CC: Primary care physician; [facility]   Note: This dictation was prepared with Dragon dictation along with smaller phrase technology. Any transcriptional errors  that result from this process are unintentional.

## 2019-04-25 ENCOUNTER — Ambulatory Visit: Payer: Medicare HMO | Admitting: Psychiatry

## 2019-04-25 ENCOUNTER — Other Ambulatory Visit: Payer: Self-pay

## 2019-04-25 ENCOUNTER — Ambulatory Visit (INDEPENDENT_AMBULATORY_CARE_PROVIDER_SITE_OTHER): Payer: Medicare HMO | Admitting: Psychiatry

## 2019-04-25 ENCOUNTER — Encounter: Payer: Self-pay | Admitting: Psychiatry

## 2019-04-25 DIAGNOSIS — F3342 Major depressive disorder, recurrent, in full remission: Secondary | ICD-10-CM | POA: Diagnosis not present

## 2019-04-25 MED ORDER — PAROXETINE HCL 20 MG PO TABS: 60.0000 mg | ORAL_TABLET | Freq: Every day | ORAL | 1 refills | Status: DC

## 2019-04-25 MED ORDER — GABAPENTIN 400 MG PO CAPS: 400.0000 mg | ORAL_CAPSULE | Freq: Every day | ORAL | 1 refills | Status: DC

## 2019-04-25 NOTE — Progress Notes (Signed)
[name] OP Progress Note  I connected with  Glenda Boone on 04/25/19 by phone and verified that I am speaking with the correct person using two identifiers.   I discussed the limitations of evaluation and management by phone. The patient expressed understanding and agreed to proceed.    04/25/2019 3:41 PM Glenda Boone  MRN:  [id number]  Chief Complaint:  " I am doing very well."   HPI: Patient reported doing well on current regimen.  She denied any issues or concerns at this time.  She stated that her family and she are doing well and are hoping to stay safe during the pandemic.  She has been sleeping well and denied any concerns with her appetite.   Visit Diagnosis:    ICD-10-CM   1. MDD (major depressive disorder), recurrent, in full remission ([place])  F33.42 gabapentin (NEURONTIN) 400 MG capsule    PARoxetine (PAXIL) 20 MG tablet    Past Psychiatric History: Depression, anxiety  Past Medical History:  Past Medical History:  Diagnosis Date  . Anxiety   . Bronchitis   . COPD (chronic obstructive pulmonary disease) ([place])   . Depression   . Diabetes mellitus, type II ([place])   . Hyperlipidemia   . Hypertension   . Neuropathy   . Ovarian cancer ([place])   . Shingles   . Thyroid disease     Past Surgical History:  Procedure Laterality Date  . ABDOMINAL HYSTERECTOMY    . ANKLE SURGERY Left   . CHOLECYSTECTOMY    . OVARY SURGERY    . SMALL INTESTINE SURGERY    . TONSILLECTOMY      Family Psychiatric History: denied  Family History:  Family History  Problem Relation Age of Onset  . Heart Problems Mother   . Hypertension Mother   . Colon cancer Father   . Arthritis-Osteo Sister     Social History:  Social History   Socioeconomic History  . Marital status: Widowed    Spouse name: Not on file  . Number of children: Not on file  . Years of education: Not on file  . Highest education level: Not on file  Occupational History  . Not on file  Social Needs  .  Financial resource strain: Not on file  . Food insecurity    Worry: Not on file    Inability: Not on file  . Transportation needs    Medical: Not on file    Non-medical: Not on file  Tobacco Use  . Smoking status: Current Every Day Smoker    Packs/day: 1.00    Types: Cigarettes    Start date: 12/19/1979  . Smokeless tobacco: Never Used  Substance and Sexual Activity  . Alcohol use: No    Alcohol/week: 0.0 standard drinks  . Drug use: No  . Sexual activity: Never  Lifestyle  . Physical activity    Days per week: Not on file    Minutes per session: Not on file  . Stress: Not on file  Relationships  . Social Herbalist on phone: Not on file    Gets together: Not on file    Attends religious service: Not on file    Active member of club or organization: Not on file    Attends meetings of clubs or organizations: Not on file    Relationship status: Not on file  Other Topics Concern  . Not on file  Social History Narrative  . Not on file  Allergies:  Allergies  Allergen Reactions  . Codeine   . Demerol [Meperidine Hcl] Anxiety    Metabolic Disorder Labs: Lab Results  Component Value Date   HGBA1C 5.6 03/09/2019   MPG 114.02 03/09/2019   No results found for: PROLACTIN Lab Results  Component Value Date   CHOL 129 03/10/2019   TRIG 230 (H) 03/10/2019   HDL 33 (L) 03/10/2019   CHOLHDL 3.9 03/10/2019   VLDL 46 (H) 03/10/2019   LDLCALC 50 03/10/2019   No results found for: TSH  Therapeutic Level Labs: No results found for: LITHIUM No results found for: VALPROATE No components found for:  CBMZ  Current Medications: Current Outpatient Medications  Medication Sig Dispense Refill  . gabapentin (NEURONTIN) 400 MG capsule Take 1 capsule (400 mg total) by mouth at bedtime. 90 capsule 1  . hydrochlorothiazide (HYDRODIURIL) 25 MG tablet Take 25 mg by mouth daily.     Marland Kitchen levothyroxine (SYNTHROID) 75 MCG tablet Take 75 mcg by mouth daily before breakfast.  (take with 290mcg tablet to total 252mcg daily)    . levothyroxine (SYNTHROID, LEVOTHROID) 200 MCG tablet Take 200 mcg by mouth daily before breakfast. (take with 34mcg tablet to equal total of 264mcg daily)    . losartan (COZAAR) 100 MG tablet Take 100 mg by mouth daily. for high blood pressure  1  . metFORMIN (GLUCOPHAGE-XR) 500 MG 24 hr tablet Take 1,000 mg by mouth daily with breakfast.   0  . PARoxetine (PAXIL) 20 MG tablet Take 3 tablets (60 mg total) by mouth daily. 270 tablet 1  . simvastatin (ZOCOR) 40 MG tablet Take 40 mg by mouth daily.     No current facility-administered medications for this visit.       Psychiatric Specialty Exam: ROS  There were no vitals taken for this visit.There is no height or weight on file to calculate BMI.  General Appearance: Unable to assess due to phone visit  Eye Contact: Unable to assess due to phone visit  Speech:  Clear and Coherent and Normal Rate  Volume:  Normal  Mood:  Euthymic  Affect:  Congruent  Thought Process:  Goal Directed, Linear and Descriptions of Associations: Intact  Orientation:  Full (Time, Place, and Person)  Thought Content: Logical   Suicidal Thoughts:  No  Homicidal Thoughts:  No  Memory:  Immediate;   Good Recent;   Good Remote;   Good  Judgement:  Fair  Insight:  Good  Psychomotor Activity:  Normal  Concentration:  Concentration: Good and Attention Span: Good  Recall:  Good  Fund of Knowledge: Good  Language: Good  Akathisia:  Negative  Handed:  Right  AIMS (if indicated): Not done  Assets:  Communication Skills Desire for Improvement Financial Resources/Insurance Housing Social Support  ADL's:  Intact  Cognition: WNL  Sleep:  Good     Assessment and Plan: Patient reported doing well on her current regimen.  1. MDD (major depressive disorder), recurrent, in full remission ([name])  - gabapentin (NEURONTIN) 400 MG capsule; Take 1 capsule (400 mg total) by mouth at bedtime.  Dispense: 90 capsule;  Refill: 1 - PARoxetine (PAXIL) 20 MG tablet; Take 3 tablets (60 mg total) by mouth daily.  Dispense: 270 tablet; Refill: 1  Continue same medication regimen. Follow up in 3 months.    Glenda Crane, MD 04/25/2019, 3:41 PM

## 2019-06-07 ENCOUNTER — Telehealth: Payer: Self-pay

## 2019-06-07 NOTE — Telephone Encounter (Signed)
received a fax that [employer] approved from 05-27-19 to 05-25-20 for quantity limits.

## 2019-06-07 NOTE — Telephone Encounter (Signed)
receove notice that rx for paroxetine needed a prior auth for quantity limits.

## 2019-06-07 NOTE — Telephone Encounter (Signed)
went online to [email or web address] and submitted the prior auth . - pending 

## 2019-07-20 ENCOUNTER — Telehealth: Payer: Self-pay

## 2019-07-20 ENCOUNTER — Other Ambulatory Visit: Payer: Self-pay

## 2019-07-20 ENCOUNTER — Ambulatory Visit: Payer: Medicare HMO | Admitting: Psychiatry

## 2019-07-20 NOTE — Telephone Encounter (Signed)
pt called states she didnt get a call for her appt. pt gave a phone number of [phone] pt was told that we had a [phone] # and she stated that it was the wrong number.

## 2019-07-21 NOTE — Telephone Encounter (Signed)
I called the number on her chart several times and it kept going to voicemail. Please update/confirm her number and please offer her appointment for today at 3 pm. Thanks.

## 2019-09-04 ENCOUNTER — Observation Stay: Payer: Medicare HMO

## 2019-09-04 ENCOUNTER — Observation Stay
Admission: EM | Admit: 2019-09-04 | Discharge: 2019-09-05 | Disposition: A | Discharge status: Home / Self Care | Payer: Medicare HMO | Attending: Internal Medicine | Admitting: Internal Medicine

## 2019-09-04 ENCOUNTER — Emergency Department: Payer: Medicare HMO

## 2019-09-04 ENCOUNTER — Other Ambulatory Visit: Payer: Self-pay

## 2019-09-04 ENCOUNTER — Encounter: Payer: Self-pay | Admitting: Emergency Medicine

## 2019-09-04 DIAGNOSIS — J449 Chronic obstructive pulmonary disease, unspecified: Secondary | ICD-10-CM | POA: Insufficient documentation

## 2019-09-04 DIAGNOSIS — Z8249 Family history of ischemic heart disease and other diseases of the circulatory system: Secondary | ICD-10-CM | POA: Diagnosis not present

## 2019-09-04 DIAGNOSIS — I6782 Cerebral ischemia: Secondary | ICD-10-CM | POA: Insufficient documentation

## 2019-09-04 DIAGNOSIS — E785 Hyperlipidemia, unspecified: Secondary | ICD-10-CM | POA: Insufficient documentation

## 2019-09-04 DIAGNOSIS — G43909 Migraine, unspecified, not intractable, without status migrainosus: Secondary | ICD-10-CM | POA: Diagnosis not present

## 2019-09-04 DIAGNOSIS — E039 Hypothyroidism, unspecified: Secondary | ICD-10-CM | POA: Insufficient documentation

## 2019-09-04 DIAGNOSIS — Z7984 Long term (current) use of oral hypoglycemic drugs: Secondary | ICD-10-CM | POA: Insufficient documentation

## 2019-09-04 DIAGNOSIS — R519 Headache, unspecified: Secondary | ICD-10-CM | POA: Diagnosis present

## 2019-09-04 DIAGNOSIS — R4701 Aphasia: Secondary | ICD-10-CM

## 2019-09-04 DIAGNOSIS — Z8543 Personal history of malignant neoplasm of ovary: Secondary | ICD-10-CM | POA: Insufficient documentation

## 2019-09-04 DIAGNOSIS — Z79899 Other long term (current) drug therapy: Secondary | ICD-10-CM | POA: Diagnosis not present

## 2019-09-04 DIAGNOSIS — E7849 Other hyperlipidemia: Secondary | ICD-10-CM

## 2019-09-04 DIAGNOSIS — E1140 Type 2 diabetes mellitus with diabetic neuropathy, unspecified: Secondary | ICD-10-CM | POA: Diagnosis not present

## 2019-09-04 DIAGNOSIS — F17210 Nicotine dependence, cigarettes, uncomplicated: Secondary | ICD-10-CM | POA: Insufficient documentation

## 2019-09-04 DIAGNOSIS — Z6841 Body Mass Index (BMI) 40.0 and over, adult: Secondary | ICD-10-CM | POA: Diagnosis not present

## 2019-09-04 DIAGNOSIS — F419 Anxiety disorder, unspecified: Secondary | ICD-10-CM | POA: Diagnosis not present

## 2019-09-04 DIAGNOSIS — G459 Transient cerebral ischemic attack, unspecified: Principal | ICD-10-CM

## 2019-09-04 DIAGNOSIS — Z8673 Personal history of transient ischemic attack (TIA), and cerebral infarction without residual deficits: Secondary | ICD-10-CM | POA: Diagnosis present

## 2019-09-04 DIAGNOSIS — F329 Major depressive disorder, single episode, unspecified: Secondary | ICD-10-CM | POA: Diagnosis not present

## 2019-09-04 DIAGNOSIS — Z885 Allergy status to narcotic agent status: Secondary | ICD-10-CM | POA: Diagnosis not present

## 2019-09-04 DIAGNOSIS — E7800 Pure hypercholesterolemia, unspecified: Secondary | ICD-10-CM | POA: Diagnosis not present

## 2019-09-04 DIAGNOSIS — Z66 Do not resuscitate: Secondary | ICD-10-CM | POA: Insufficient documentation

## 2019-09-04 DIAGNOSIS — Z79890 Hormone replacement therapy (postmenopausal): Secondary | ICD-10-CM | POA: Insufficient documentation

## 2019-09-04 DIAGNOSIS — Z20822 Contact with and (suspected) exposure to covid-19: Secondary | ICD-10-CM | POA: Insufficient documentation

## 2019-09-04 DIAGNOSIS — I10 Essential (primary) hypertension: Secondary | ICD-10-CM | POA: Diagnosis not present

## 2019-09-04 LAB — URINE DRUG SCREEN, QUALITATIVE (ARMC ONLY)
Amphetamines, Ur Screen: NOT DETECTED
Barbiturates, Ur Screen: NOT DETECTED
Benzodiazepine, Ur Scrn: NOT DETECTED
Cannabinoid 50 Ng, Ur ~~LOC~~: NOT DETECTED
Cocaine Metabolite,Ur ~~LOC~~: NOT DETECTED
MDMA (Ecstasy)Ur Screen: NOT DETECTED
Methadone Scn, Ur: NOT DETECTED
Opiate, Ur Screen: NOT DETECTED
Phencyclidine (PCP) Ur S: NOT DETECTED
Tricyclic, Ur Screen: NOT DETECTED

## 2019-09-04 LAB — URINALYSIS, COMPLETE (UACMP) WITH MICROSCOPIC
Bilirubin Urine: NEGATIVE
Glucose, UA: NEGATIVE mg/dL
Hgb urine dipstick: NEGATIVE
Ketones, ur: NEGATIVE mg/dL
Leukocytes,Ua: NEGATIVE
Nitrite: NEGATIVE
Protein, ur: NEGATIVE mg/dL
Specific Gravity, Urine: 1.012 (ref 1.005–1.030)
pH: 6 (ref 5.0–8.0)

## 2019-09-04 LAB — GLUCOSE, CAPILLARY
Glucose-Capillary: 107 mg/dL — ABNORMAL HIGH (ref 70–99)
Glucose-Capillary: 86 mg/dL (ref 70–99)
Glucose-Capillary: 127 mg/dL — ABNORMAL HIGH (ref 70–99)

## 2019-09-04 LAB — DIFFERENTIAL
Abs Immature Granulocytes: 0.05 10*3/uL (ref 0.00–0.07)
Basophils Absolute: 0.1 10*3/uL (ref 0.0–0.1)
Basophils Relative: 1 %
Eosinophils Absolute: 0.3 10*3/uL (ref 0.0–0.5)
Eosinophils Relative: 2 %
Immature Granulocytes: 0 %
Lymphocytes Relative: 18 %
Lymphs Abs: 2.3 10*3/uL (ref 0.7–4.0)
Monocytes Absolute: 0.8 10*3/uL (ref 0.1–1.0)
Monocytes Relative: 6 %
Neutro Abs: 9.5 10*3/uL — ABNORMAL HIGH (ref 1.7–7.7)
Neutrophils Relative %: 73 %

## 2019-09-04 LAB — CBC
HCT: 43.1 % (ref 36.0–46.0)
Hemoglobin: 14.7 g/dL (ref 12.0–15.0)
MCH: 33.6 pg (ref 26.0–34.0)
MCHC: 34.1 g/dL (ref 30.0–36.0)
MCV: 98.6 fL (ref 80.0–100.0)
Platelets: 240 10*3/uL (ref 150–400)
RBC: 4.37 MIL/uL (ref 3.87–5.11)
RDW: 13.4 % (ref 11.5–15.5)
WBC: 12.9 10*3/uL — ABNORMAL HIGH (ref 4.0–10.5)
nRBC: 0 % (ref 0.0–0.2)

## 2019-09-04 LAB — COMPREHENSIVE METABOLIC PANEL
ALT: 18 U/L (ref 0–44)
AST: 19 U/L (ref 15–41)
Albumin: 4 g/dL (ref 3.5–5.0)
Alkaline Phosphatase: 59 U/L (ref 38–126)
Anion gap: 11 (ref 5–15)
BUN: 31 mg/dL — ABNORMAL HIGH (ref 8–23)
CO2: 28 mmol/L (ref 22–32)
Calcium: 9 mg/dL (ref 8.9–10.3)
Chloride: 99 mmol/L (ref 98–111)
Creatinine, Ser: 0.94 mg/dL (ref 0.44–1.00)
GFR calc Af Amer: >60 mL/min (ref >60)
GFR calc non Af Amer: >60 mL/min (ref >60)
Glucose, Bld: 116 mg/dL — ABNORMAL HIGH (ref 70–99)
Potassium: 4.1 mmol/L (ref 3.5–5.1)
Sodium: 138 mmol/L (ref 135–145)
Total Bilirubin: 0.8 mg/dL (ref 0.3–1.2)
Total Protein: 7.5 g/dL (ref 6.5–8.1)

## 2019-09-04 LAB — APTT: aPTT: 37 seconds — ABNORMAL HIGH (ref 24–36)

## 2019-09-04 LAB — PROTIME-INR
INR: 1 (ref 0.8–1.2)
Prothrombin Time: 13.1 seconds (ref 11.4–15.2)

## 2019-09-04 LAB — SARS CORONAVIRUS 2 (TAT 6-24 HRS): SARS Coronavirus 2: NEGATIVE

## 2019-09-04 MED ORDER — LEVOTHYROXINE SODIUM 50 MCG PO TABS
200.0000 ug | ORAL_TABLET | Freq: Every day | ORAL | Status: DC
Start: 2019-09-05 — End: 2019-09-05
  Administered 2019-09-05: 200 ug via ORAL
  Filled 2019-09-04: qty 4

## 2019-09-04 MED ORDER — PAROXETINE HCL 30 MG PO TABS
60.0000 mg | ORAL_TABLET | Freq: Every day | ORAL | Status: DC
  Administered 2019-09-05: 60 mg via ORAL
  Filled 2019-09-04: qty 2

## 2019-09-04 MED ORDER — KETOROLAC TROMETHAMINE 30 MG/ML IJ SOLN: 30.0000 mg | Freq: Once | INTRAMUSCULAR | Status: DC

## 2019-09-04 MED ORDER — SENNOSIDES-DOCUSATE SODIUM 8.6-50 MG PO TABS
1.0000 | ORAL_TABLET | Freq: Every evening | ORAL | Status: DC | PRN
Start: 2019-09-04 — End: 2019-09-05

## 2019-09-04 MED ORDER — HYDROCHLOROTHIAZIDE 25 MG PO TABS
25.0000 mg | ORAL_TABLET | Freq: Every day | ORAL | Status: DC
  Administered 2019-09-05: 25 mg via ORAL
  Filled 2019-09-04: qty 1

## 2019-09-04 MED ORDER — SIMVASTATIN 20 MG PO TABS
40.0000 mg | ORAL_TABLET | Freq: Every day | ORAL | Status: DC
  Administered 2019-09-05: 40 mg via ORAL
  Filled 2019-09-04: qty 2

## 2019-09-04 MED ORDER — ASPIRIN 325 MG PO TABS
325.0000 mg | ORAL_TABLET | Freq: Every day | ORAL | Status: DC
  Administered 2019-09-05: 325 mg via ORAL
  Filled 2019-09-04 (×2): qty 1

## 2019-09-04 MED ORDER — STROKE: EARLY STAGES OF RECOVERY BOOK: Freq: Once | Status: DC

## 2019-09-04 MED ORDER — ACETAMINOPHEN 325 MG PO TABS
650.0000 mg | ORAL_TABLET | ORAL | Status: DC | PRN
  Administered 2019-09-04: 650 mg via ORAL
  Filled 2019-09-04: qty 2

## 2019-09-04 MED ORDER — SODIUM CHLORIDE 0.9 % IV SOLN: INTRAVENOUS | Status: DC

## 2019-09-04 MED ORDER — ASPIRIN 300 MG RE SUPP
300.0000 mg | Freq: Every day | RECTAL | Status: DC
  Filled 2019-09-04: qty 1

## 2019-09-04 MED ORDER — ENOXAPARIN SODIUM 40 MG/0.4ML ~~LOC~~ SOLN: 40.0000 mg | SUBCUTANEOUS | Status: DC

## 2019-09-04 MED ORDER — BUTALBITAL-APAP-CAFFEINE 50-325-40 MG PO TABS: 1.0000 | ORAL_TABLET | ORAL | Status: DC | PRN

## 2019-09-04 MED ORDER — ACETAMINOPHEN 650 MG RE SUPP: 650.0000 mg | RECTAL | Status: DC | PRN

## 2019-09-04 MED ORDER — LEVOTHYROXINE SODIUM 50 MCG PO TABS
75.0000 ug | ORAL_TABLET | Freq: Every day | ORAL | Status: DC
  Administered 2019-09-05: 75 ug via ORAL
  Filled 2019-09-04: qty 2

## 2019-09-04 MED ORDER — INSULIN ASPART 100 UNIT/ML ~~LOC~~ SOLN: 0.0000 [IU] | Freq: Three times a day (TID) | SUBCUTANEOUS | Status: DC

## 2019-09-04 MED ORDER — GABAPENTIN 400 MG PO CAPS: 400.0000 mg | ORAL_CAPSULE | Freq: Every day | ORAL | Status: DC

## 2019-09-04 MED ORDER — ACETAMINOPHEN 160 MG/5ML PO SOLN
650.0000 mg | ORAL | Status: DC | PRN
  Filled 2019-09-04: qty 20.3

## 2019-09-04 MED ORDER — ONDANSETRON HCL 4 MG/2ML IJ SOLN: 4.0000 mg | Freq: Four times a day (QID) | INTRAMUSCULAR | Status: DC | PRN

## 2019-09-04 MED ORDER — LOSARTAN POTASSIUM 50 MG PO TABS
100.0000 mg | ORAL_TABLET | Freq: Every day | ORAL | Status: DC
  Administered 2019-09-05: 100 mg via ORAL
  Filled 2019-09-04: qty 2

## 2019-09-04 MED ORDER — ENOXAPARIN SODIUM 40 MG/0.4ML ~~LOC~~ SOLN
40.0000 mg | Freq: Two times a day (BID) | SUBCUTANEOUS | Status: DC
Start: 2019-09-04 — End: 2019-09-05
  Administered 2019-09-04 – 2019-09-05 (×2): 40 mg via SUBCUTANEOUS
  Filled 2019-09-04 (×2): qty 0.4

## 2019-09-04 MED ORDER — INSULIN ASPART 100 UNIT/ML ~~LOC~~ SOLN: 0.0000 [IU] | Freq: Every day | SUBCUTANEOUS | Status: DC

## 2019-09-04 MED ORDER — HYDRALAZINE HCL 20 MG/ML IJ SOLN
10.0000 mg | INTRAMUSCULAR | Status: DC | PRN
  Filled 2019-09-04: qty 0.5

## 2019-09-04 MED ORDER — ACETAMINOPHEN 500 MG PO TABS
1000.0000 mg | ORAL_TABLET | Freq: Once | ORAL | Status: AC
  Administered 2019-09-04: 1000 mg via ORAL
  Filled 2019-09-04: qty 2

## 2019-09-04 NOTE — H&P (Addendum)
History and Physical    Glenda Boone [id number] DOB: 25-Aug-1950 DOA: 09/04/2019  PCP: [facility], [facility]  Patient coming from: Home  I have personally briefly reviewed patient's old medical records in [place]  Chief Complaint: Word finding difficulty  HPI: Glenda Boone is a 69 y.o. female with medical history significant of hypertension, hyperlipidemia, type 2 diabetes mellitus, morbid obesity, hypothyroidism who presents to the emergency department for evaluation of word finding difficulty.  Patient reports no injury or specific inciting events.  She stated that she experienced the acute onset of the feeling like she could not get her words out associated with a retro-orbital headache.  The patient states that she has had headaches like this in the past but they have not been associated with any word finding difficulty.  She she attempted to treat symptoms with Tylenol with minimal to no relief.  Given her symptoms the patient presented to the emergency department.  On presentation the patient was hypertensive otherwise hemodynamically stable.  NIH stroke scale was performed and patient scored a 1.  No indication for TPA administration.  CT negative.  Hospitalist called for admission for presumptive working diagnosis of TIA versus complex migraine.  ED Course: On presentation patient was hemodynamically stable save for hypertension.  She was administered 1000 g of p.o. Tylenol x1 with minimal or no relief.  CT head was performed and was negative.  Laboratory investigation overall unrevealing.  No major metabolic or hematologic abnormalities.  Hospitalist called for admission.  Review of Systems: As per HPI otherwise 10 point review of systems negative.    Past Medical History:  Diagnosis Date  . Anxiety   . Bronchitis   . COPD (chronic obstructive pulmonary disease) ([place])   . Depression   . Diabetes mellitus, type II ([place])   . Hyperlipidemia   .  Hypertension   . Neuropathy   . Ovarian cancer ([place])   . Shingles   . Thyroid disease     Past Surgical History:  Procedure Laterality Date  . ABDOMINAL HYSTERECTOMY    . ANKLE SURGERY Left   . CHOLECYSTECTOMY    . OVARY SURGERY    . SMALL INTESTINE SURGERY    . TONSILLECTOMY       reports that she has been smoking cigarettes. She started smoking about 39 years ago. She has been smoking about 1.00 pack per day. She has never used smokeless tobacco. She reports that she does not drink alcohol or use drugs.  Allergies  Allergen Reactions  . Codeine   . Demerol [Meperidine Hcl] Anxiety    Family History  Problem Relation Age of Onset  . Heart Problems Mother   . Hypertension Mother   . Colon cancer Father   . Arthritis-Osteo Sister      Prior to Admission medications   Medication Sig Start Date End Date Taking? Authorizing Provider  gabapentin (NEURONTIN) 400 MG capsule Take 1 capsule (400 mg total) by mouth at bedtime. 04/25/19   [name], MD  hydrochlorothiazide (HYDRODIURIL) 25 MG tablet Take 25 mg by mouth daily.  06/30/17   [provider]  levothyroxine (SYNTHROID) 75 MCG tablet Take 75 mcg by mouth daily before breakfast. (take with 277mcg tablet to total 275mcg daily)    [provider]  levothyroxine (SYNTHROID, LEVOTHROID) 200 MCG tablet Take 200 mcg by mouth daily before breakfast. (take with 73mcg tablet to equal total of 257mcg daily)    [provider]  losartan (COZAAR)  100 MG tablet Take 100 mg by mouth daily. for high blood pressure 01/09/18   [provider]  metFORMIN (GLUCOPHAGE-XR) 500 MG 24 hr tablet Take 1,000 mg by mouth daily with breakfast.  12/23/17   [provider]  PARoxetine (PAXIL) 20 MG tablet Take 3 tablets (60 mg total) by mouth daily. 04/25/19   [name], MD  simvastatin (ZOCOR) 40 MG tablet Take 40 mg by mouth daily.    [provider]    Physical Exam: Vitals:   09/04/19 1335  09/04/19 1341  BP:  (!) 184/101  Pulse:  76  Resp:  (!) 21  Temp:  97.8 F (36.6 C)  SpO2:  94%  Weight: (!) 136.7 kg   Height: 5\' 3"  (1.6 m)      Vitals:   09/04/19 1335 09/04/19 1341  BP:  (!) 184/101  Pulse:  76  Resp:  (!) 21  Temp:  97.8 F (36.6 C)  SpO2:  94%  Weight: (!) 136.7 kg   Height: 5\' 3"  (1.6 m)   Constitutional: Answers all questions appropriately.  Appears in mild distress secondary to word finding difficulty Eyes: PERRL, lids and conjunctivae normal ENMT: Mucous membranes are moist. Posterior pharynx clear of any exudate or lesions.4 dentition.  Neck: normal, supple, no masses, no thyromegaly Respiratory: Lung sounds diminished.  Scattered bibasilar crackles.  Normal work of breathing Cardiovascular: Regular rate and rhythm, no murmurs, trace pitting edema bilaterally Abdomen: Obese, nontender, positive bowel sounds  musculoskeletal: No clubbing, no obvious joint deformities, normal muscle tone Skin: no rashes, lesions, ulcers. No induration Neurologic: CN 2-12 grossly intact. Sensation intact.  Strength 5/5 in all 4.  Psychiatric: Normal judgment and insight. Alert and oriented x 3. Normal mood.   Labs on Admission: I have personally reviewed following labs and imaging studies  CBC: Recent Labs  Lab 09/04/19 1338  WBC 12.9*  NEUTROABS 9.5*  HGB 14.7  HCT 43.1  MCV 98.6  PLT [id number]   Basic Metabolic Panel: Recent Labs  Lab 09/04/19 1338  NA 138  K 4.1  CL 99  CO2 28  GLUCOSE 116*  BUN 31*  CREATININE 0.94  CALCIUM 9.0   GFR: Estimated Creatinine Clearance: 75.7 mL/min (by C-G formula based on SCr of 0.94 mg/dL). Liver Function Tests: Recent Labs  Lab 09/04/19 1338  AST 19  ALT 18  ALKPHOS 59  BILITOT 0.8  PROT 7.5  ALBUMIN 4.0   No results for input(s): LIPASE, AMYLASE in the last 168 hours. No results for input(s): AMMONIA in the last 168 hours. Coagulation Profile: Recent Labs  Lab 09/04/19 1338  INR 1.0   Cardiac  Enzymes: No results for input(s): CKTOTAL, CKMB, CKMBINDEX, TROPONINI in the last 168 hours. BNP (last 3 results) No results for input(s): PROBNP in the last 8760 hours. HbA1C: No results for input(s): HGBA1C in the last 72 hours. CBG: No results for input(s): GLUCAP in the last 168 hours. Lipid Profile: No results for input(s): CHOL, HDL, LDLCALC, TRIG, CHOLHDL, LDLDIRECT in the last 72 hours. Thyroid Function Tests: No results for input(s): TSH, T4TOTAL, FREET4, T3FREE, THYROIDAB in the last 72 hours. Anemia Panel: No results for input(s): VITAMINB12, FOLATE, FERRITIN, TIBC, IRON, RETICCTPCT in the last 72 hours. Urine analysis:    Component Value Date/Time   COLORURINE YELLOW (A) 03/09/2019 2001   APPEARANCEUR HAZY (A) 03/09/2019 2001   LABSPEC 1.021 03/09/2019 2001   PHURINE 5.0 03/09/2019 2001   GLUCOSEU NEGATIVE 03/09/2019 2001   HGBUR  NEGATIVE 03/09/2019 2001   [place] NEGATIVE 03/09/2019 2001   [place] 03/09/2019 2001   PROTEINUR NEGATIVE 03/09/2019 2001   NITRITE NEGATIVE 03/09/2019 2001   LEUKOCYTESUR NEGATIVE 03/09/2019 2001    Radiological Exams on Admission: CT HEAD WO CONTRAST  Result Date: 09/04/2019 CLINICAL DATA:  Headache, difficulty speaking. EXAM: CT HEAD WITHOUT CONTRAST TECHNIQUE: Contiguous axial images were obtained from the base of the skull through the vertex without intravenous contrast. COMPARISON:  03/09/2019 FINDINGS: Brain: No evidence of acute infarction, hemorrhage, hydrocephalus, extra-axial collection or mass lesion/mass effect. Signs of white matter disease similar to previous exams. Vascular: No hyperdense vessel or unexpected calcification. Skull: Normal. Negative for fracture or focal lesion. Sinuses/Orbits: Visualized paranasal sinuses and orbits are unremarkable. Other: None IMPRESSION: No acute intracranial abnormality. Electronically Signed   By: Zetta Bills M.D.   On: 09/04/2019 14:29    EKG: Ordered and pending at time  of this note  Assessment/Plan Active Problems:   TIA (transient ischemic attack)  Word-finding difficulty, suspect TIA vs complex migraine Patient symptoms are consistent with TIA however could also manifest in the setting of complex migraine Patient states that the word finding difficulties were noted first and then the headache occurred later NIH stroke score 1 at time of ED visit Plan: Place in observation Frequent neuro checks 2D echocardiogram MRI brain without contrast Blood pressure control A.m. fasting lipids UA complete UDS Daily ASA Toradol 30mg  IV x 1 PRN fioricet PT/OT consults Possible neurology consult based on imaging findings and patient symptoms in the morning  Hypertension Blood pressure elevated in ED but unclear whether this is reliable.  Blood pressure cuff was on the patient forearm at time my evaluation Plan: Continue home HCTZ 25 mg daily Continue home losartan 100 mg daily As needed IV hydralazine  Hyperlipidemia Continue home statin Check a.m. fasting lipid panel  Hypothyroidism Continue home Synthroid  Depression Continue home Paxil  Morbid obesity BMI 53.39 Counseled patient Nutrition consult  Type II Diabetes  Hold home metformin SSI 0-15U Carb modified diet HbA1c  Tobacco Abuse Counsel patient Offer nicotine patch  DVT prophylaxis: Lovenox Code Status: DNR, discussed and confirmed with patient Family Communication: Daughter Aldona Bar [phone] via phone Disposition Plan: Anticipate return to previous home environment Consults called: none Admission status: obs   Sidney Ace MD [facility] Pager [phone]   If 7PM-7AM, please contact night-coverage 09/04/2019, 4:16 PM

## 2019-09-04 NOTE — ED Provider Notes (Signed)
-----------------------------------------   2:33 PM on 09/04/2019 -----------------------------------------  I have personally seen and evaluated the patient in conjunction with nurse practitioner [facility].  Patient continues to have intermittent word finding difficulty.  Does state a moderate headache as well behind her eye.  The remainder the patient's neurological exam is intact.  Differential this time would include TIA, CVA, complicated migraine.  We will obtain labs, CT imaging.  If CT imaging is negative I would anticipate admission to the hospital service for TIA work-up.  Patient agreeable to plan of care.  NIH Stroke Scale   Interval: Baseline Time: 2:33 PM Person Administering Scale: Harvest Dark  Administer stroke scale items in the order listed. Record performance in each category after each subscale exam. Do not go back and change scores. Follow directions provided for each exam technique. Scores should reflect what the patient does, not what the clinician thinks the patient can do. The clinician should record answers while administering the exam and work quickly. Except where indicated, the patient should not be coached (i.e., repeated requests to patient to make a special effort).   1a  Level of consciousness: 0=alert; keenly responsive  1b. LOC questions:  0=Performs both tasks correctly  1c. LOC commands: 0=Performs both tasks correctly  2.  Best Gaze: 0=normal  3.  Visual: 0=No visual loss  4. Facial Palsy: 0=Normal symmetric movement  5a.  Motor left arm: 0=No drift, limb holds 90 (or 45) degrees for full 10 seconds  5b.  Motor right arm: 0=No drift, limb holds 90 (or 45) degrees for full 10 seconds  6a. motor left leg: 0=No drift, limb holds 90 (or 45) degrees for full 10 seconds  6b  Motor right leg:  0=No drift, limb holds 90 (or 45) degrees for full 10 seconds  7. Limb Ataxia: 0=Absent  8.  Sensory: 0=Normal; no sensory loss  9. Best Language:   1=Mild to moderate aphasia; some obvious loss of fluency or facility of comprehension without significant limitation on ideas expressed or form of expression.  10. Dysarthria: 0=Normal  11. Extinction and Inattention: 0=No abnormality  12. Distal motor function: 0=Normal   Total:   1     Harvest Dark, MD 09/04/19 1434

## 2019-09-04 NOTE — Progress Notes (Signed)
PHARMACIST - PHYSICIAN COMMUNICATION  CONCERNING:  Enoxaparin (Lovenox) for DVT Prophylaxis    RECOMMENDATION: Patient was prescribed enoxaprin 40mg  q24 hours for VTE prophylaxis.   Filed Weights   09/04/19 1335  Weight: (!) 136.7 kg (301 lb 5.9 oz)    Body mass index is 53.39 kg/m.  Estimated Creatinine Clearance: 75.7 mL/min (by C-G formula based on SCr of 0.94 mg/dL).   Based on [place] patient is candidate for enoxaparin 40mg  every 12 hour dosing due to BMI being >40.   DESCRIPTION: Pharmacy has adjusted enoxaparin dose per [facility] policy.  Patient is now receiving enoxaparin 40mg  every 12 hours.   Rowland Lathe, PharmD Clinical Pharmacist  09/04/2019 4:11 PM

## 2019-09-04 NOTE — ED Triage Notes (Signed)
Pt presents from home via [facility] with c/o headache that began around 4am. Pt reports "having trouble with my thought process, i feel like i cant get my words outs". Pt had similar episode in october. CBG 126. Hx of hypertension

## 2019-09-04 NOTE — ED Notes (Signed)
This RN attempted to call report x2. This RN placed on hold with no answer. Was informed charge RN would call back with first call. No call back at this time

## 2019-09-04 NOTE — ED Notes (Signed)
Pt ambulated to toilet. Able to urinate on own.

## 2019-09-04 NOTE — ED Notes (Signed)
This RN attempted to call report at this time.  

## 2019-09-04 NOTE — ED Notes (Signed)
This RN attempted to call report. No answer from floor assigned RN x2. Floor given this RN number, informed in 15 min, if no phone call back, that it will be rolling report. Floor agrees.

## 2019-09-04 NOTE — ED Provider Notes (Signed)
[facility] Emergency Department Provider Note ____________________________________________  Time seen: Approximately 1:51 PM  I have reviewed the triage vital signs and the nursing notes.   HISTORY  Chief Complaint Headache   HPI Glenda Boone is a 69 y.o. female with a history of diabetes, hypertension, hypothyroidism, and chronic bronchitis presents to the emergency department for treatment and evaluation of headache that started at approximately 4 AM.  She feels that she cannot "get her words out."   Location: Frontal and behind left eye Similar to previous headaches: yes Duration: Constant TIMING: 4am SEVERITY: Similar to previous. QUALITY:7/10 CONTEXT: No trauma MODIFYING FACTORS: No relief with Tylenol ASSOCIATED SYMPTOMS: Difficulty finding words Past Medical History:  Diagnosis Date  . Anxiety   . Bronchitis   . COPD (chronic obstructive pulmonary disease) ([place])   . Depression   . Diabetes mellitus, type II ([place])   . Hyperlipidemia   . Hypertension   . Neuropathy   . Ovarian cancer ([place])   . Shingles   . Thyroid disease     Patient Active Problem List   Diagnosis Date Noted  . MDD (major depressive disorder), recurrent, in full remission ([place]) 04/25/2019  . Aphagia 03/09/2019  . Shingles outbreak 06/21/2015  . Chronic bronchitis ([place]) 06/21/2015  . 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[place]) 11/28/2014  . H/O diabetes mellitus 11/28/2014  . Anxiety, generalized 11/28/2014  . H/O hypercholesterolemia 11/28/2014  . H/O: HTN (hypertension) 11/28/2014  . H/O: hypothyroidism 11/28/2014  . Depression, major, recurrent, moderate ([place]) 11/28/2014  . H/O: obesity 11/28/2014  . H/O disease 11/28/2014  . Neuropathy 11/28/2014    Past Surgical History:  Procedure Laterality Date  . ABDOMINAL HYSTERECTOMY    . ANKLE SURGERY Left   . CHOLECYSTECTOMY    . OVARY SURGERY    . SMALL INTESTINE SURGERY    . TONSILLECTOMY      Prior to  Admission medications   Medication Sig Start Date End Date Taking? Authorizing Provider  gabapentin (NEURONTIN) 400 MG capsule Take 1 capsule (400 mg total) by mouth at bedtime. 04/25/19   [name], MD  hydrochlorothiazide (HYDRODIURIL) 25 MG tablet Take 25 mg by mouth daily.  06/30/17   [provider]  levothyroxine (SYNTHROID) 75 MCG tablet Take 75 mcg by mouth daily before breakfast. (take with 268mcg tablet to total 258mcg daily)    [provider]  levothyroxine (SYNTHROID, LEVOTHROID) 200 MCG tablet Take 200 mcg by mouth daily before breakfast. (take with 55mcg tablet to equal total of 21mcg daily)    [provider]  losartan (COZAAR) 100 MG tablet Take 100 mg by mouth daily. for high blood pressure 01/09/18   [provider]  metFORMIN (GLUCOPHAGE-XR) 500 MG 24 hr tablet Take 1,000 mg by mouth daily with breakfast.  12/23/17   [provider]  PARoxetine (PAXIL) 20 MG tablet Take 3 tablets (60 mg total) by mouth daily. 04/25/19   [name], MD  simvastatin (ZOCOR) 40 MG tablet Take 40 mg by mouth daily.    [provider]    Allergies Codeine and Demerol [meperidine hcl]  Family History  Problem Relation Age of Onset  . Heart Problems Mother   . Hypertension Mother   . Colon cancer Father   . Arthritis-Osteo Sister     Social History Social History   Tobacco Use  . Smoking status: Current Every Day Smoker    Packs/day: 1.00    Types: Cigarettes    Start date: 12/19/1979  .  Smokeless tobacco: Never Used  Substance Use Topics  . Alcohol use: No    Alcohol/week: 0.0 standard drinks  . Drug use: No    Review of Systems Constitutional: No fever/chills or recent injury. Eyes: No visual changes. ENT: No sore throat. Respiratory: Denies shortness of breath. Gastrointestinal: No abdominal pain.  No nausea, no vomiting.  No diarrhea.  No constipation. Musculoskeletal: Negative for pain. Skin: Negative for  rash. Neurological:Positive for headache, negative for focal weakness or numbness. No confusion or fainting. ___________________________________________   PHYSICAL EXAM:  VITAL SIGNS: ED Triage Vitals  Enc Vitals Group     BP 09/04/19 1341 (!) 184/101     Pulse Rate 09/04/19 1341 76     Resp 09/04/19 1341 (!) 21     Temp 09/04/19 1341 97.8 F (36.6 C)     Temp src --      SpO2 09/04/19 1341 94 %     Weight 09/04/19 1335 (!) 301 lb 5.9 oz (136.7 kg)     Height 09/04/19 1335 5\' 3"  (1.6 m)     Head Circumference --      Peak Flow --      Pain Score 09/04/19 1334 9     Pain Loc --      Pain Edu? --      Excl. in [place]? --     Constitutional: Alert and oriented. Well appearing and in no acute distress. Eyes: Conjunctivae are normal. PERRL. EOMI without expressed pain. No evidence of papilledema on limited exam. Head: Atraumatic. Nose: No congestion/rhinnorhea. Mouth/Throat: Mucous membranes are moist.  Oropharynx non-erythematous. Neck: No stridor. Supple, no meningismus.  Cardiovascular: Normal rate, regular rhythm. Grossly normal heart sounds.  Good peripheral circulation. Respiratory: Normal respiratory effort.  No retractions. Diffuse expiratory wheeze. Gastrointestinal: Soft and nontender. No distention.  Musculoskeletal: No lower extremity tenderness nor edema.  No joint effusions. Neurologic:  Normal speech and language. No gross focal neurologic deficits are appreciated. No gait instability. Cranial nerves: 2-10 normal as tested. Cerebellar: Normal Vand[place]-nose-Ousley testing. Sensorimotor: Positive for aphasia, pronator drift, clonus, sensory loss or abnormal reflexes.  Skin:  Skin is warm, dry and intact. No rash noted. Psychiatric: Mood and affect are normal. Speech and behavior are normal. Normal thought process and cognition.  ____________________________________________   LABS (all labs ordered are listed, but only abnormal results are displayed)  Labs Reviewed   APTT - Abnormal; Notable for the following components:      Result Value   aPTT 37 (*)    All other components within normal limits  CBC - Abnormal; Notable for the following components:   WBC 12.9 (*)    All other components within normal limits  DIFFERENTIAL - Abnormal; Notable for the following components:   Neutro Abs 9.5 (*)    All other components within normal limits  COMPREHENSIVE METABOLIC PANEL - Abnormal; Notable for the following components:   Glucose, Bld 116 (*)    BUN 31 (*)    All other components within normal limits  SARS CORONAVIRUS 2 (TAT 6-24 HRS)  PROTIME-INR  CBG MONITORING, ED  I-STAT CREATININE, ED   ____________________________________________  EKG  Not indicated. ____________________________________________  RADIOLOGY  CT HEAD WO CONTRAST  Result Date: 09/04/2019 CLINICAL DATA:  Headache, difficulty speaking. EXAM: CT HEAD WITHOUT CONTRAST TECHNIQUE: Contiguous axial images were obtained from the base of the skull through the vertex without intravenous contrast. COMPARISON:  03/09/2019 FINDINGS: Brain: No evidence of acute infarction, hemorrhage, hydrocephalus, extra-axial collection or mass  lesion/mass effect. Signs of white matter disease similar to previous exams. Vascular: No hyperdense vessel or unexpected calcification. Skull: Normal. Negative for fracture or focal lesion. Sinuses/Orbits: Visualized paranasal sinuses and orbits are unremarkable. Other: None IMPRESSION: No acute intracranial abnormality. Electronically Signed   By: Zetta Bills M.D.   On: 09/04/2019 14:29   ____________________________________________   PROCEDURES  Procedure(s) performed:  Procedures  Critical Care performed: None ____________________________________________   INITIAL IMPRESSION / ASSESSMENT AND PLAN / ED COURSE  69 year old female presenting to the emergency department for treatment and evaluation of headache that started around 4 AM and was not  relieved with Tylenol.  Patient reports similar symptoms in the past with a diagnosis of TIA.  She states that she stayed in the hospital overnight and symptoms resolved the next day.  She has had no residual effects.  Today, she denies weakness, chest pain, shortness of breath, or other concerns outside of headache and inability to use the right words.  She is immediately aware that she has not used the correct word when she is speaking and is able to correct herself.  Differential diagnosis includes but is not limited to: CVA, TIA, ICH, migraine  Very mild leukocytosis with 12.9.  BUN is elevated at thirty-one.  Head CT is negative for acute findings.  She is noted to be slightly hypertensive which will be left untreated at this point and considered permissive hypertension.  Plan will be to bring her into the hospital for further CVA work-up. Hospitalist consult requested.  Pertinent labs & imaging results that were available during my care of the patient were reviewed by me and considered in my medical decision making (see chart for details). ____________________________________________   FINAL CLINICAL IMPRESSION(S) / ED DIAGNOSES  Final diagnoses:  Aphasia  Acute nonintractable headache, unspecified headache type    ED Discharge Orders    None       Victorino Dike, FNP 09/04/19 1545    Harvest Dark, MD 09/05/19 1348

## 2019-09-05 ENCOUNTER — Observation Stay: Admit: 2019-09-05 | Payer: Medicare HMO

## 2019-09-05 DIAGNOSIS — G459 Transient cerebral ischemic attack, unspecified: Secondary | ICD-10-CM | POA: Diagnosis not present

## 2019-09-05 LAB — COMPREHENSIVE METABOLIC PANEL
ALT: 18 U/L (ref 0–44)
AST: 24 U/L (ref 15–41)
Albumin: 3.7 g/dL (ref 3.5–5.0)
Alkaline Phosphatase: 53 U/L (ref 38–126)
Anion gap: 9 (ref 5–15)
BUN: 29 mg/dL — ABNORMAL HIGH (ref 8–23)
CO2: 29 mmol/L (ref 22–32)
Calcium: 8.6 mg/dL — ABNORMAL LOW (ref 8.9–10.3)
Chloride: 97 mmol/L — ABNORMAL LOW (ref 98–111)
Creatinine, Ser: 0.99 mg/dL (ref 0.44–1.00)
GFR calc Af Amer: >60 mL/min (ref >60)
GFR calc non Af Amer: 58 mL/min — ABNORMAL LOW (ref >60)
Glucose, Bld: 96 mg/dL (ref 70–99)
Potassium: 3.4 mmol/L — ABNORMAL LOW (ref 3.5–5.1)
Sodium: 135 mmol/L (ref 135–145)
Total Bilirubin: 0.8 mg/dL (ref 0.3–1.2)
Total Protein: 6.8 g/dL (ref 6.5–8.1)

## 2019-09-05 LAB — LIPID PANEL
Cholesterol: 134 mg/dL (ref 0–200)
HDL: 35 mg/dL — ABNORMAL LOW (ref >40)
LDL Cholesterol: 74 mg/dL (ref 0–99)
Total CHOL/HDL Ratio: 3.8 RATIO
Triglycerides: 125 mg/dL (ref <150)
VLDL: 25 mg/dL (ref 0–40)

## 2019-09-05 LAB — CBC WITH DIFFERENTIAL/PLATELET
Abs Immature Granulocytes: 0.04 10*3/uL (ref 0.00–0.07)
Basophils Absolute: 0.1 10*3/uL (ref 0.0–0.1)
Basophils Relative: 1 %
Eosinophils Absolute: 0.3 10*3/uL (ref 0.0–0.5)
Eosinophils Relative: 2 %
HCT: 39.8 % (ref 36.0–46.0)
Hemoglobin: 13.4 g/dL (ref 12.0–15.0)
Immature Granulocytes: 0 %
Lymphocytes Relative: 23 %
Lymphs Abs: 2.7 10*3/uL (ref 0.7–4.0)
MCH: 32.8 pg (ref 26.0–34.0)
MCHC: 33.7 g/dL (ref 30.0–36.0)
MCV: 97.5 fL (ref 80.0–100.0)
Monocytes Absolute: 0.8 10*3/uL (ref 0.1–1.0)
Monocytes Relative: 7 %
Neutro Abs: 7.8 10*3/uL — ABNORMAL HIGH (ref 1.7–7.7)
Neutrophils Relative %: 67 %
Platelets: 225 10*3/uL (ref 150–400)
RBC: 4.08 MIL/uL (ref 3.87–5.11)
RDW: 13.3 % (ref 11.5–15.5)
WBC: 11.7 10*3/uL — ABNORMAL HIGH (ref 4.0–10.5)
nRBC: 0 % (ref 0.0–0.2)

## 2019-09-05 LAB — GLUCOSE, CAPILLARY
Glucose-Capillary: 109 mg/dL — ABNORMAL HIGH (ref 70–99)
Glucose-Capillary: 117 mg/dL — ABNORMAL HIGH (ref 70–99)

## 2019-09-05 NOTE — Progress Notes (Signed)
SLP Cancellation Note  Patient Details Name: Glenda Boone MRN: [id number] DOB: 15-Apr-1951   Cancelled treatment:       Reason Eval/Treat Not Completed: SLP screened, no needs identified, will sign off(chart reviewed; consulted NSG then met w/ pt). Pt denied any difficulty swallowing and is currently on a regular diet; tolerates swallowing pills w/ water per NSG. Pt conversed at conversational level w/out overt deficits noted; pt denied any speech-language deficits. Noted she performed well w/ PT also. No further skilled ST services indicated as pt appears at her baseline. Pt agreed. NSG to reconsult if any change in status.     Orinda Kenner, MS, CCC-SLP Felicia Both 09/05/2019, 9:52 AM

## 2019-09-05 NOTE — Progress Notes (Signed)
OT Cancellation Note  Patient Details Name: Glenda Boone MRN: [id number] DOB: [birth year]   Cancelled Treatment:    Reason Eval/Treat Not Completed: Other (comment)  OT consulted initially, but PT and SLP report no acute OT needs identified and pt is to d/c this date. OT order discontinued by MD.  Gerrianne Scale, [place], OTR/L ascom [phone] 09/05/19, 9:31 AM

## 2019-09-05 NOTE — Progress Notes (Signed)
D: Pt alert and oriented x 4. Pt denies experiencing any pain at this time.   A: Pt received discharge and medication education/information. Pt belongings were gathered and taken with pt upon discharge.   R: Pt verbalized understanding of discharge and medication education/information.  Pt escorted to medical mall front lobby via wheelchair by staff where family picked pt up.

## 2019-09-05 NOTE — Discharge Instructions (Signed)
Transient Ischemic Attack  A transient ischemic attack (TIA) is a "warning stroke" that causes stroke-like symptoms that go away quickly. A TIA does not cause lasting damage to the brain. But having a TIA is a sign that you may be at risk for a stroke. Lifestyle changes and medical treatments can help prevent a stroke. It is important to know the symptoms of a TIA and what to do. Get help right away, even if your symptoms go away. The symptoms of a TIA are the same as those of a stroke. They can happen fast, and they usually go away within minutes or hours. They can include:  Weakness or loss of feeling in your face, arm, or leg. This often happens on one side of your body.  Trouble walking.  Trouble moving your arms or legs.  Trouble talking or understanding what people are saying.  Trouble seeing.  Seeing two of one object (double vision).  Feeling dizzy.  Feeling confused.  Loss of balance or coordination.  Feeling sick to your stomach (nauseous) and throwing up (vomiting).  A very bad headache for no reason. What increases the risk? Certain things may make you more likely to have a TIA. Some of these are things that you can change, such as:  Being very overweight (obese).  Using products that contain nicotine or tobacco, such as cigarettes and e-cigarettes.  Taking birth control pills.  Not being active.  Drinking too much alcohol.  Using drugs. Other risk factors include:  Having an irregular heartbeat (atrial fibrillation).  Being African American or Hispanic.  Having had blood clots, stroke, TIA, or heart attack in the past.  Being a woman with a history of high blood pressure in pregnancy (preeclampsia).  Being over the age of 60.  Being female.  Having family history of stroke.  Having the following diseases or conditions: ? High blood pressure. ? High cholesterol. ? Diabetes. ? Heart disease. ? Sickle cell disease. ? Sleep apnea. ? Migraine  headache. ? Long-term (chronic) diseases that cause soreness and swelling (inflammation). ? Disorders that affect how your blood clots. Follow these instructions at home: Medicines   Take over-the-counter and prescription medicines only as told by your doctor.  If you were told to take aspirin or another medicine to thin your blood, take it exactly as told by your doctor. ? Taking too much of the medicine can cause bleeding. ? Taking too little of the medicine may not work to treat the problem. Eating and drinking   Eat 5 or more servings of fruits and vegetables each day.  Follow instructions from your doctor about your diet. You may need to follow a certain diet to help lower your risk of having a stroke. You may need to: ? Eat a diet that is low in fat and salt. ? Eat foods that contain a lot of fiber. ? Limit the amount of carbohydrates and sugar in your diet.  Limit alcohol intake to 1 drink a day for nonpregnant women and 2 drinks a day for men. One drink equals 12 oz of beer, 5 oz of wine, or 1 oz of hard liquor. General instructions  Keep a healthy weight.  Stay active. Try to get at least 30 minutes of activity on all or most days.  Find out if you have a condition called sleep apnea. Get treatment if needed.  Do not use any products that contain nicotine or tobacco, such as cigarettes and e-cigarettes. If you need help quitting,   ask your doctor.  Do not abuse drugs.  Keep all follow-up visits as told by your doctor. This is important. Get help right away if:  You have any signs of stroke. "BE FAST" is an easy way to remember the main warning signs: ? B - Balance. Signs are dizziness, sudden trouble walking, or loss of balance. ? E - Eyes. Signs are trouble seeing or a sudden change in how you see. ? F - Face. Signs are sudden weakness or loss of feeling of the face, or the face or eyelid drooping on one side. ? A - Arms. Signs are weakness or loss of feeling in an  arm. This happens suddenly and usually on one side of the body. ? S - Speech. Signs are sudden trouble speaking, slurred speech, or trouble understanding what people say. ? T - Time. Time to call emergency services. Write down what time symptoms started.  You have other signs of stroke, such as: ? A sudden, very bad headache with no known cause. ? Feeling sick to your stomach (nausea). ? Throwing up (vomiting). ? Jerky movements that you cannot control (seizure). These symptoms may be an emergency. Do not wait to see if the symptoms will go away. Get medical help right away. Call your local emergency services (911 in the [place] Do not drive yourself to the hospital. Summary  A transient ischemic attack (TIA) is a "warning stroke" that causes stroke-like symptoms that go away quickly.  A TIA is a medical emergency. Get help right away, even if your symptoms go away.  A TIA does not cause lasting damage to the brain.  Having a TIA is a sign that you may be at risk for a stroke. Lifestyle changes and medical treatments can help prevent a stroke. This information is not intended to replace advice given to you by your health care provider. Make sure you discuss any questions you have with your health care provider. Document Revised: 02/05/2018 Document Reviewed: 08/13/2016 Elsevier Patient Education  2020 Elsevier Inc.  

## 2019-09-05 NOTE — Discharge Summary (Signed)
Physician Discharge Summary  [name] [id number] DOB: 08-17-1950 DOA: 09/04/2019  PCP: [facility] date: 09/04/2019 Discharge date: 09/05/2019  Admitted From: Home Disposition:  Home  Recommendations for Outpatient Follow-up:  1. Follow up with PCP in 1-2 weeks 2. Referral placed for outpatient PT services  Home Health:None Equipment/Devices:None  Discharge Condition:Stable CODE STATUS:FULL Diet recommendation: Heart Healthy / Carb Modified  Brief/Interim Summary: HPI: Glenda Boone is a 69 y.o. female with medical history significant of hypertension, hyperlipidemia, type 2 diabetes mellitus, morbid obesity, hypothyroidism who presents to the emergency department for evaluation of word finding difficulty.  Patient reports no injury or specific inciting events.  She stated that she experienced the acute onset of the feeling like she could not get her words out associated with a retro-orbital headache.  The patient states that she has had headaches like this in the past but they have not been associated with any word finding difficulty.  She she attempted to treat symptoms with Tylenol with minimal to no relief.  Given her symptoms the patient presented to the emergency department.  On presentation the patient was hypertensive otherwise hemodynamically stable.  NIH stroke scale was performed and patient scored a 1.  No indication for TPA administration.  CT negative.  Hospitalist called for admission for presumptive working diagnosis of TIA versus complex migraine.  4/12: Patient seen and examined.  Word finding difficulty resolved.  MRI negative.  No indication for echocardiogram at this time.  Will discontinue order.  Physical therapy is evaluated patient recommended home health PT.  In order to ensure outpatient PT services will order ambulatory referral for outpatient physical therapy.  No changes in medications at this time.  Patient instructed that  she needs better control of her blood pressure and her diabetes.  She was also strongly encouraged to stop smoking.  Patient expressed understanding of all post discharge instructions.  Patient will be discharged home in stable condition.   Discharge Diagnoses:  Active Problems:   TIA (transient ischemic attack)  Word-finding difficulty, suspect TIA vs complex migraine Patient symptoms are consistent with TIA however could also manifest in the setting of complex migraine Patient states that the word finding difficulties were noted first and then the headache occurred later NIH stroke score 1 at time of ED visit MRI brain negative Word finding difficulty had resolved on hospital day #2 Patient stable for discharge at this time Will need close outpatient PCP follow-up Educated on the importance of good blood pressure control, diabetes control, smoking cessation, weight loss  Hypertension Continue home HCTZ 25 mg daily Continue home losartan 100 mg daily Will need close outpatient PCP followup  Hyperlipidemia Continue home statin   Hypothyroidism Continue home Synthroid  Depression Continue home Paxil  Morbid obesity BMI 53.39 Counseled patient Nutrition consult  Type II Diabetes  Continue home metformin  Tobacco Abuse Counsel patient Offer nicotine patch  Discharge Instructions  Discharge Instructions    Diet - low sodium heart healthy   Complete by: As directed    Increase activity slowly   Complete by: As directed      Allergies as of 09/05/2019      Reactions   Codeine    Demerol [meperidine Hcl] Anxiety      Medication List    TAKE these medications   hydrochlorothiazide 25 MG tablet Commonly known as: HYDRODIURIL Take 25 mg by mouth daily.   levothyroxine 200 MCG tablet Commonly known as: SYNTHROID Take 200  mcg by mouth daily before breakfast. (take with 35mcg tablet to equal total of 29mcg daily)   levothyroxine 75 MCG tablet Commonly  known as: SYNTHROID Take 75 mcg by mouth daily before breakfast. (take with 265mcg tablet to total 293mcg daily)   losartan 100 MG tablet Commonly known as: COZAAR Take 100 mg by mouth daily. for high blood pressure   metFORMIN 500 MG 24 hr tablet Commonly known as: GLUCOPHAGE-XR Take 1,500 mg by mouth daily with breakfast. 2 tablets in the morning and one tablet at night.   PARoxetine 20 MG tablet Commonly known as: PAXIL Take 3 tablets (60 mg total) by mouth daily.   simvastatin 40 MG tablet Commonly known as: ZOCOR Take 40 mg by mouth daily.      [facility]. Schedule an appointment as soon as possible for a visit on 09/23/2019.   Why: Appt w/ Kalman Drape, MD @ 11:00 am Contact information: [address] [place] [address] [phone]          Allergies  Allergen Reactions  . Codeine   . Demerol [Meperidine Hcl] Anxiety    Consultations: none  Procedures/Studies: CT HEAD WO CONTRAST  Result Date: 09/04/2019 CLINICAL DATA:  Headache, difficulty speaking. EXAM: CT HEAD WITHOUT CONTRAST TECHNIQUE: Contiguous axial images were obtained from the base of the skull through the vertex without intravenous contrast. COMPARISON:  03/09/2019 FINDINGS: Brain: No evidence of acute infarction, hemorrhage, hydrocephalus, extra-axial collection or mass lesion/mass effect. Signs of white matter disease similar to previous exams. Vascular: No hyperdense vessel or unexpected calcification. Skull: Normal. Negative for fracture or focal lesion. Sinuses/Orbits: Visualized paranasal sinuses and orbits are unremarkable. Other: None IMPRESSION: No acute intracranial abnormality. Electronically Signed   By: Zetta Bills M.D.   On: 09/04/2019 14:29   MR BRAIN WO CONTRAST  Result Date: 09/04/2019 CLINICAL DATA:  69 year old female with headache and difficulty speaking today. EXAM: MRI HEAD WITHOUT CONTRAST TECHNIQUE: Multiplanar,  multiecho pulse sequences of the brain and surrounding structures were obtained without intravenous contrast. COMPARISON:  Head CT earlier today. Brain MRI 03/09/2019. FINDINGS: Brain: No restricted diffusion or evidence of acute infarction. 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 Widely scattered and patchy bilateral cerebral white matter and deep gray matter nuclei T2 and FLAIR hyperintensity appears stable since October, including evidence of small chronic lacunar infarcts in each thalamus. Similar mild to moderate T2 heterogeneity in the pons is stable. No cortical encephalomalacia or chronic cerebral blood products identified. No midline shift, mass effect, evidence of mass lesion, ventriculomegaly, extra-axial collection or acute intracranial hemorrhage. Cervicomedullary junction and pituitary are within normal limits. Vascular: Major intracranial vascular flow voids are stable. Skull and upper cervical spine: Negative for age visible cervical spine. Bone marrow signal is stable and within normal limits. Sinuses/Orbits: Stable and negative. Other: Mastoids remain clear. Grossly normal visible internal auditory structures. Scalp and face soft tissues appear negative. IMPRESSION: 1. No acute intracranial abnormality. 2. Stable noncontrast MRI appearance of the brain since October, with moderate for age chronic ischemic disease. Electronically Signed   By: Genevie Ann M.D.   On: 09/04/2019 19:28    (Echo, Carotid, EGD, Colonoscopy, ERCP)    Subjective: Seen and examined on the day of discharge No complaints, word finding difficulty resolved Stable for discharge  Discharge Exam: Vitals:   09/05/19 0814 09/05/19 1146  BP: (!) 185/83 (!) 159/83  Pulse: 65 60  Resp: 16 20  Temp: 98.6  F (37 C) 97.6 F (36.4 C)  SpO2: 97% 93%   Vitals:   09/04/19 2349 09/05/19 0417 09/05/19 0814 09/05/19 1146  BP: (!) 180/74 (!) 189/98 (!) 185/83 (!) 159/83  Pulse: 67 68 65 60  Resp: 18 18 16 20    Temp: 98.4 F (36.9 C)  98.6 F (37 C) 97.6 F (36.4 C)  TempSrc: Oral  Oral Oral  SpO2: 94% 95% 97% 93%  Weight:      Height:        General: Pt is alert, awake, not in acute distress Cardiovascular: RRR, S1/S2 +, no rubs, no gallops Respiratory: CTA bilaterally, no wheezing, no rhonchi Abdominal: Soft, NT, ND, bowel sounds + Extremities: no edema, no cyanosis    The results of significant diagnostics from this hospitalization (including imaging, microbiology, ancillary and laboratory) are listed below for reference.     Microbiology: Recent Results (from the past 240 hour(s))  SARS CORONAVIRUS 2 (TAT 6-24 HRS) Nasopharyngeal Nasopharyngeal Swab     Status: None   Collection Time: 09/04/19  1:38 PM   Specimen: Nasopharyngeal Swab  Result Value Ref Range Status   SARS Coronavirus 2 NEGATIVE NEGATIVE Final    Comment: (NOTE) SARS-CoV-2 target nucleic acids are NOT DETECTED. The SARS-CoV-2 RNA is generally detectable in upper and lower respiratory specimens during the acute phase of infection. Negative results do not preclude SARS-CoV-2 infection, do not rule out co-infections with other pathogens, and should not be used as the sole basis for treatment or other patient management decisions. Negative results must be combined with clinical observations, patient history, and epidemiological information. The expected result is Negative. Fact Sheet for Patients: SugarRoll.be Fact Sheet for Healthcare Providers: https://www.woods-mathews.com/ This test is not yet approved or cleared by the [place] FDA and  has been authorized for detection and/or diagnosis of SARS-CoV-2 by FDA under an Emergency Use Authorization (EUA). This EUA will remain  in effect (meaning this test can be used) for the duration of the COVID-19 declaration under Section 56 4(b)(1) of the Act, 21 U.S.C. section 360bbb-3(b)(1), unless the authorization is  terminated or revoked sooner. Performed at [facility], [place] [address] [place],  [address]      Labs: BNP (last 3 results) No results for input(s): BNP in the last 8760 hours. Basic Metabolic Panel: Recent Labs  Lab 09/04/19 1338 09/05/19 0446  NA 138 135  K 4.1 3.4*  CL 99 97*  CO2 28 29  GLUCOSE 116* 96  BUN 31* 29*  CREATININE 0.94 0.99  CALCIUM 9.0 8.6*   Liver Function Tests: Recent Labs  Lab 09/04/19 1338 09/05/19 0446  AST 19 24  ALT 18 18  ALKPHOS 59 53  BILITOT 0.8 0.8  PROT 7.5 6.8  ALBUMIN 4.0 3.7   No results for input(s): LIPASE, AMYLASE in the last 168 hours. No results for input(s): AMMONIA in the last 168 hours. CBC: Recent Labs  Lab 09/04/19 1338 09/05/19 0446  WBC 12.9* 11.7*  NEUTROABS 9.5* 7.8*  HGB 14.7 13.4  HCT 43.1 39.8  MCV 98.6 97.5  PLT 240 225   Cardiac Enzymes: No results for input(s): CKTOTAL, CKMB, CKMBINDEX, TROPONINI in the last 168 hours. BNP: Invalid input(s): POCBNP CBG: Recent Labs  Lab 09/04/19 1953 09/04/19 2109 09/04/19 2142 09/05/19 0810 09/05/19 1148  GLUCAP 107* 86 127* 109* 117*   D-Dimer No results for input(s): DDIMER in the last 72 hours. Hgb A1c No results for input(s): HGBA1C in the last 72  hours. Lipid Profile Recent Labs    09/05/19 0446  CHOL 134  HDL 35*  LDLCALC 74  TRIG 125  CHOLHDL 3.8   Thyroid function studies No results for input(s): TSH, T4TOTAL, T3FREE, THYROIDAB in the last 72 hours.  Invalid input(s): FREET3 Anemia work up No results for input(s): VITAMINB12, FOLATE, FERRITIN, TIBC, IRON, RETICCTPCT in the last 72 hours. Urinalysis    Component Value Date/Time   COLORURINE STRAW (A) 09/04/2019 2030   [place] (A) 09/04/2019 2030   LABSPEC 1.012 09/04/2019 2030   [place] 6.0 09/04/2019 2030   GLUCOSEU NEGATIVE 09/04/2019 2030   [place] NEGATIVE 09/04/2019 2030   [place] NEGATIVE 09/04/2019 2030   [place] 09/04/2019 2030    PROTEINUR NEGATIVE 09/04/2019 2030   NITRITE NEGATIVE 09/04/2019 2030   LEUKOCYTESUR NEGATIVE 09/04/2019 2030   Sepsis Labs Invalid input(s): PROCALCITONIN,  WBC,  LACTICIDVEN Microbiology Recent Results (from the past 240 hour(s))  SARS CORONAVIRUS 2 (TAT 6-24 HRS) Nasopharyngeal Nasopharyngeal Swab     Status: None   Collection Time: 09/04/19  1:38 PM   Specimen: Nasopharyngeal Swab  Result Value Ref Range Status   SARS Coronavirus 2 NEGATIVE NEGATIVE Final    Comment: (NOTE) SARS-CoV-2 target nucleic acids are NOT DETECTED. The SARS-CoV-2 RNA is generally detectable in upper and lower respiratory specimens during the acute phase of infection. Negative results do not preclude SARS-CoV-2 infection, do not rule out co-infections with other pathogens, and should not be used as the sole basis for treatment or other patient management decisions. Negative results must be combined with clinical observations, patient history, and epidemiological information. The expected result is Negative. Fact Sheet for Patients: SugarRoll.be Fact Sheet for Healthcare Providers: https://www.woods-mathews.com/ This test is not yet approved or cleared by the [place] FDA and  has been authorized for detection and/or diagnosis of SARS-CoV-2 by FDA under an Emergency Use Authorization (EUA). This EUA will remain  in effect (meaning this test can be used) for the duration of the COVID-19 declaration under Section 56 4(b)(1) of the Act, 21 U.S.C. section 360bbb-3(b)(1), unless the authorization is terminated or revoked sooner. Performed at [facility], [place] [address] [place], [place] [address]      Time coordinating discharge: Over 30 minutes  SIGNED:   Sidney Ace, MD  [facility] 09/05/2019, 12:15 PM Pager   If 7PM-7AM, please contact night-coverage

## 2019-09-05 NOTE — Evaluation (Addendum)
Physical Therapy Evaluation Patient Details Name: Glenda Boone MRN: [id number] DOB: February 23, 1951 Today's Date: 09/05/2019   History of Present Illness  presented to ER secondary to acute-onset of word-finding difficulties; admitted for TIA/CVA work up vs complex migraine management.  CT/MRI negative for acute findings.  Clinical Impression  Upon evaluation, patient alert and oriented; follows commands and eager for anticipated discharge.  Bilat UE/LE strength and ROM Grossly symmetrical and WFL; no focal weakness appreciated.  Speech clear and fluent; reports word-finding difficulties resolved.  Able to complete bed mobility with mod indep; sit/stand, basic transfers and gait (20') with SPC/RW, cga/close sup.  Higher-level balance deficits evident with gait trials; do recommend continued use of RW for future gait efforts.  Patient voices understanding and agreement; subjectively voicing optimal comfort/confidence with use of RW at this time (has one at home). Would benefit from skilled PT to address above deficits and promote optimal return to PLOF.; Recommend transition to HHPT upon discharge from acute hospitalization.  Of note, order discontinued by attending physician while therapist in room actively completing evaluation.  Noted current order for discharge. If patient remains hospitalized, will seek new order for work on dynamic balance and mobility deficits.     Follow Up Recommendations Home health PT    Equipment Recommendations  (has SPC, RW)    Recommendations for Other Services       Precautions / Restrictions Precautions Precautions: Fall Restrictions Weight Bearing Restrictions: No      Mobility  Bed Mobility Overal bed mobility: Modified Independent                Transfers Overall transfer level: Modified independent Equipment used: Straight cane             General transfer comment: broad BOS, mod use of momentum for lift  off  Ambulation/Gait Ambulation/Gait assistance: Min guard;Supervision Gait Distance (Feet): 20 Feet Assistive device: Straight cane       General Gait Details: broad BOS, forward flexed posture; choppy stepping pattern.  Tends to hold IV pole with gait efforts (in addition to [facility]), will plan to trial RW  Stairs            Wheelchair Mobility    Modified Rankin (Stroke Patients Only)       Balance Overall balance assessment: Needs assistance Sitting-balance support: No upper extremity supported;Feet supported Sitting balance-Leahy Scale: Good     Standing balance support: Bilateral upper extremity supported Standing balance-Leahy Scale: Fair                               Pertinent Vitals/Pain Pain Assessment: No/denies pain    Home Living Family/patient expects to be discharged to:: Private residence Living Arrangements: Children Available Help at Discharge: Family;Available 24 hours/day Type of Home: House Home Access: Stairs to enter Entrance Stairs-Rails: Right;Left;Can reach both Entrance Stairs-Number of Steps: 8 Home Layout: One level Home Equipment: Walker - 2 wheels;Cane - single point      Prior Function Level of Independence: Independent with assistive device(s)         Comments: Pt uses cane at baseline, rarely out of the home but able to manage in the home.     Hand Dominance        Extremity/Trunk Assessment   Upper Extremity Assessment Upper Extremity Assessment: Overall WFL for tasks assessed(grossly 4+/5 throughout; no focal weakness appreciated)    Lower Extremity Assessment Lower Extremity Assessment: Overall [facility]  for tasks assessed(grossly 4+/5 throughout; no focal weakness appreciated.  Baseline neuropathy mid-calf distally)       Communication   Communication: No difficulties  Cognition Arousal/Alertness: Awake/alert Behavior During Therapy: WFL for tasks assessed/performed Overall Cognitive Status: Within  Functional Limits for tasks assessed                                        General Comments      Exercises Other Exercises Other Exercises: 13' with RW, sup-improved stability and overall fluidity; patient subjectively reporting improved confidence wiht RW.  Do recommend use of RW vs SPC for optimal safety/stability. Patient voiced understanding. Other Exercises: Toilet transfer, ambulatory with SPC/IV pole, sup/mod indep; sit/stand from standard toilet with grab bar, mod indep; standing balance at sink for hand hygiene, close sup. Fair/good awareness of limits of stability.   Assessment/Plan    PT Assessment Patient needs continued PT services  PT Problem List Decreased activity tolerance;Decreased balance;Decreased mobility;Decreased coordination;Cardiopulmonary status limiting activity       PT Treatment Interventions DME instruction;Gait training;Stair training;Functional mobility training;Therapeutic activities;Therapeutic exercise;Balance training;Patient/family education    PT Goals (Current goals can be found in the Care Plan section)  Acute Rehab PT Goals Patient Stated Goal: to go home PT Goal Formulation: With patient Time For Goal Achievement: 09/19/19 Potential to Achieve Goals: Good    Frequency Min 2X/week   Barriers to discharge        Co-evaluation               AM-PAC PT "6 Clicks" Mobility  Outcome Measure Help needed turning from your back to your side while in a flat bed without using bedrails?: None Help needed moving from lying on your back to sitting on the side of a flat bed without using bedrails?: None Help needed moving to and from a bed to a chair (including a wheelchair)?: None Help needed standing up from a chair using your arms (e.g., wheelchair or bedside chair)?: None Help needed to walk in hospital room?: A Little Help needed climbing 3-5 steps with a railing? : A Little 6 Click Score: 22    End of Session  Equipment Utilized During Treatment: Gait belt Activity Tolerance: Patient tolerated treatment well Patient left: in bed;with call bell/phone within reach Nurse Communication: Mobility status PT Visit Diagnosis: Muscle weakness (generalized) (M62.81);Difficulty in walking, not elsewhere classified (R26.2)    Time: [id number] PT Time Calculation (min) (ACUTE ONLY): 21 min   Charges:   PT Evaluation $PT Eval Moderate Complexity: 1 Mod PT Treatments $Therapeutic Activity: 8-22 mins        Julissa Browning H. Owens Shark, PT, DPT, NCS 09/05/19, 9:25 AM [phone]

## 2019-09-06 LAB — HEMOGLOBIN A1C
Hgb A1c MFr Bld: 5.8 % — ABNORMAL HIGH (ref 4.8–5.6)
Mean Plasma Glucose: 120 mg/dL

## 2019-12-30 ENCOUNTER — Observation Stay
Admission: EM | Admit: 2019-12-30 | Discharge: 2020-01-01 | Disposition: A | Discharge status: Home / Self Care | Payer: Medicare HMO | Attending: Student | Admitting: Student

## 2019-12-30 ENCOUNTER — Encounter: Payer: Self-pay | Admitting: Emergency Medicine

## 2019-12-30 ENCOUNTER — Other Ambulatory Visit: Payer: Self-pay

## 2019-12-30 ENCOUNTER — Emergency Department: Payer: Medicare HMO

## 2019-12-30 DIAGNOSIS — G9341 Metabolic encephalopathy: Secondary | ICD-10-CM | POA: Diagnosis present

## 2019-12-30 DIAGNOSIS — Z20822 Contact with and (suspected) exposure to covid-19: Secondary | ICD-10-CM | POA: Diagnosis not present

## 2019-12-30 DIAGNOSIS — R4182 Altered mental status, unspecified: Secondary | ICD-10-CM | POA: Insufficient documentation

## 2019-12-30 DIAGNOSIS — R5381 Other malaise: Secondary | ICD-10-CM

## 2019-12-30 DIAGNOSIS — F17210 Nicotine dependence, cigarettes, uncomplicated: Secondary | ICD-10-CM | POA: Diagnosis not present

## 2019-12-30 DIAGNOSIS — R0682 Tachypnea, not elsewhere classified: Secondary | ICD-10-CM | POA: Diagnosis not present

## 2019-12-30 DIAGNOSIS — E1165 Type 2 diabetes mellitus with hyperglycemia: Secondary | ICD-10-CM

## 2019-12-30 DIAGNOSIS — F17200 Nicotine dependence, unspecified, uncomplicated: Secondary | ICD-10-CM | POA: Diagnosis not present

## 2019-12-30 DIAGNOSIS — G9340 Encephalopathy, unspecified: Secondary | ICD-10-CM | POA: Diagnosis not present

## 2019-12-30 DIAGNOSIS — E1140 Type 2 diabetes mellitus with diabetic neuropathy, unspecified: Secondary | ICD-10-CM | POA: Diagnosis not present

## 2019-12-30 DIAGNOSIS — R000 Tachycardia, unspecified: Secondary | ICD-10-CM | POA: Diagnosis not present

## 2019-12-30 DIAGNOSIS — J189 Pneumonia, unspecified organism: Secondary | ICD-10-CM

## 2019-12-30 DIAGNOSIS — Z7984 Long term (current) use of oral hypoglycemic drugs: Secondary | ICD-10-CM | POA: Insufficient documentation

## 2019-12-30 DIAGNOSIS — R0602 Shortness of breath: Secondary | ICD-10-CM | POA: Diagnosis present

## 2019-12-30 DIAGNOSIS — J449 Chronic obstructive pulmonary disease, unspecified: Secondary | ICD-10-CM | POA: Diagnosis not present

## 2019-12-30 DIAGNOSIS — E039 Hypothyroidism, unspecified: Secondary | ICD-10-CM | POA: Diagnosis not present

## 2019-12-30 DIAGNOSIS — Z8543 Personal history of malignant neoplasm of ovary: Secondary | ICD-10-CM | POA: Diagnosis not present

## 2019-12-30 DIAGNOSIS — Z6841 Body Mass Index (BMI) 40.0 and over, adult: Secondary | ICD-10-CM

## 2019-12-30 DIAGNOSIS — Z79899 Other long term (current) drug therapy: Secondary | ICD-10-CM | POA: Insufficient documentation

## 2019-12-30 DIAGNOSIS — I10 Essential (primary) hypertension: Secondary | ICD-10-CM | POA: Diagnosis not present

## 2019-12-30 DIAGNOSIS — J418 Mixed simple and mucopurulent chronic bronchitis: Secondary | ICD-10-CM

## 2019-12-30 HISTORY — DX: Metabolic encephalopathy: G93.41

## 2019-12-30 LAB — CBC WITH DIFFERENTIAL/PLATELET
Abs Immature Granulocytes: 0.07 10*3/uL (ref 0.00–0.07)
Basophils Absolute: 0.1 10*3/uL (ref 0.0–0.1)
Basophils Relative: 1 %
Eosinophils Absolute: 0.3 10*3/uL (ref 0.0–0.5)
Eosinophils Relative: 2 %
HCT: 38.8 % (ref 36.0–46.0)
Hemoglobin: 13.2 g/dL (ref 12.0–15.0)
Immature Granulocytes: 1 %
Lymphocytes Relative: 20 %
Lymphs Abs: 2.5 10*3/uL (ref 0.7–4.0)
MCH: 33.1 pg (ref 26.0–34.0)
MCHC: 34 g/dL (ref 30.0–36.0)
MCV: 97.2 fL (ref 80.0–100.0)
Monocytes Absolute: 0.9 10*3/uL (ref 0.1–1.0)
Monocytes Relative: 7 %
Neutro Abs: 8.7 10*3/uL — ABNORMAL HIGH (ref 1.7–7.7)
Neutrophils Relative %: 69 %
Platelets: 269 10*3/uL (ref 150–400)
RBC: 3.99 MIL/uL (ref 3.87–5.11)
RDW: 13.4 % (ref 11.5–15.5)
WBC: 12.4 10*3/uL — ABNORMAL HIGH (ref 4.0–10.5)
nRBC: 0 % (ref 0.0–0.2)

## 2019-12-30 LAB — COMPREHENSIVE METABOLIC PANEL
ALT: 14 U/L (ref 0–44)
AST: 26 U/L (ref 15–41)
Albumin: 3.5 g/dL (ref 3.5–5.0)
Alkaline Phosphatase: 55 U/L (ref 38–126)
Anion gap: 11 (ref 5–15)
BUN: 25 mg/dL — ABNORMAL HIGH (ref 8–23)
CO2: 26 mmol/L (ref 22–32)
Calcium: 8.5 mg/dL — ABNORMAL LOW (ref 8.9–10.3)
Chloride: 101 mmol/L (ref 98–111)
Creatinine, Ser: 1.1 mg/dL — ABNORMAL HIGH (ref 0.44–1.00)
GFR calc Af Amer: 59 mL/min — ABNORMAL LOW (ref >60)
GFR calc non Af Amer: 51 mL/min — ABNORMAL LOW (ref >60)
Glucose, Bld: 121 mg/dL — ABNORMAL HIGH (ref 70–99)
Potassium: 4.6 mmol/L (ref 3.5–5.1)
Sodium: 138 mmol/L (ref 135–145)
Total Bilirubin: 1.1 mg/dL (ref 0.3–1.2)
Total Protein: 6.9 g/dL (ref 6.5–8.1)

## 2019-12-30 LAB — GLUCOSE, CAPILLARY
Glucose-Capillary: 156 mg/dL — ABNORMAL HIGH (ref 70–99)
Glucose-Capillary: 137 mg/dL — ABNORMAL HIGH (ref 70–99)

## 2019-12-30 LAB — HEMOGLOBIN A1C
Hgb A1c MFr Bld: 5.9 % — ABNORMAL HIGH (ref 4.8–5.6)
Mean Plasma Glucose: 122.63 mg/dL

## 2019-12-30 LAB — URINALYSIS, COMPLETE (UACMP) WITH MICROSCOPIC
Bilirubin Urine: NEGATIVE
Glucose, UA: NEGATIVE mg/dL
Hgb urine dipstick: NEGATIVE
Ketones, ur: NEGATIVE mg/dL
Leukocytes,Ua: NEGATIVE
Nitrite: NEGATIVE
Protein, ur: NEGATIVE mg/dL
Specific Gravity, Urine: 1.018 (ref 1.005–1.030)
pH: 5 (ref 5.0–8.0)

## 2019-12-30 LAB — SARS CORONAVIRUS 2 BY RT PCR (HOSPITAL ORDER, PERFORMED IN ~~LOC~~ HOSPITAL LAB): SARS Coronavirus 2: NEGATIVE

## 2019-12-30 LAB — TROPONIN I (HIGH SENSITIVITY)
Troponin I (High Sensitivity): 10 ng/L (ref <18)
Troponin I (High Sensitivity): 11 ng/L (ref <18)

## 2019-12-30 LAB — AMMONIA: Ammonia: 18 umol/L (ref 9–35)

## 2019-12-30 LAB — PROCALCITONIN: Procalcitonin: <0.1 ng/mL

## 2019-12-30 MED ORDER — IPRATROPIUM-ALBUTEROL 0.5-2.5 (3) MG/3ML IN SOLN: 3.0000 mL | RESPIRATORY_TRACT | Status: DC | PRN

## 2019-12-30 MED ORDER — ALBUTEROL SULFATE (2.5 MG/3ML) 0.083% IN NEBU
2.5000 mg | INHALATION_SOLUTION | RESPIRATORY_TRACT | Status: DC | PRN
Start: 2019-12-30 — End: 2019-12-30

## 2019-12-30 MED ORDER — BISACODYL 10 MG RE SUPP: 10.0000 mg | Freq: Every day | RECTAL | Status: DC | PRN

## 2019-12-30 MED ORDER — IPRATROPIUM-ALBUTEROL 0.5-2.5 (3) MG/3ML IN SOLN
3.0000 mL | Freq: Once | RESPIRATORY_TRACT | Status: AC
  Administered 2019-12-30: 3 mL via RESPIRATORY_TRACT
  Filled 2019-12-30: qty 3

## 2019-12-30 MED ORDER — DOCUSATE SODIUM 100 MG PO CAPS
100.0000 mg | ORAL_CAPSULE | Freq: Two times a day (BID) | ORAL | Status: DC
  Administered 2019-12-31: 100 mg via ORAL
  Filled 2019-12-30 (×3): qty 1

## 2019-12-30 MED ORDER — CEFTRIAXONE SODIUM 2 G IJ SOLR: 2.0000 g | INTRAMUSCULAR | Status: DC

## 2019-12-30 MED ORDER — MOMETASONE FURO-FORMOTEROL FUM 200-5 MCG/ACT IN AERO
2.0000 | INHALATION_SPRAY | Freq: Two times a day (BID) | RESPIRATORY_TRACT | Status: DC
  Administered 2019-12-30 – 2020-01-01 (×4): 2 via RESPIRATORY_TRACT
  Filled 2019-12-30: qty 8.8

## 2019-12-30 MED ORDER — INSULIN ASPART 100 UNIT/ML ~~LOC~~ SOLN: 0.0000 [IU] | Freq: Every day | SUBCUTANEOUS | Status: DC

## 2019-12-30 MED ORDER — NICOTINE 14 MG/24HR TD PT24
14.0000 mg | MEDICATED_PATCH | Freq: Every day | TRANSDERMAL | Status: DC
  Administered 2019-12-31 – 2020-01-01 (×2): 14 mg via TRANSDERMAL
  Filled 2019-12-30 (×2): qty 1

## 2019-12-30 MED ORDER — ENOXAPARIN SODIUM 40 MG/0.4ML ~~LOC~~ SOLN
40.0000 mg | Freq: Two times a day (BID) | SUBCUTANEOUS | Status: DC
  Administered 2019-12-31 – 2020-01-01 (×3): 40 mg via SUBCUTANEOUS
  Filled 2019-12-30 (×3): qty 0.4

## 2019-12-30 MED ORDER — ONDANSETRON HCL 4 MG PO TABS: 4.0000 mg | ORAL_TABLET | Freq: Four times a day (QID) | ORAL | Status: DC | PRN

## 2019-12-30 MED ORDER — SODIUM CHLORIDE 0.9 % IV SOLN
500.0000 mg | INTRAVENOUS | Status: DC
Start: 2019-12-31 — End: 2019-12-30

## 2019-12-30 MED ORDER — SENNOSIDES-DOCUSATE SODIUM 8.6-50 MG PO TABS: 1.0000 | ORAL_TABLET | Freq: Every evening | ORAL | Status: DC | PRN

## 2019-12-30 MED ORDER — SODIUM CHLORIDE 0.9 % IV SOLN
2.0000 g | INTRAVENOUS | Status: DC
  Administered 2019-12-30: 2 g via INTRAVENOUS
  Filled 2019-12-30: qty 20

## 2019-12-30 MED ORDER — SODIUM CHLORIDE 0.9 % IV SOLN
500.0000 mg | INTRAVENOUS | Status: DC
  Administered 2019-12-30: 500 mg via INTRAVENOUS
  Filled 2019-12-30: qty 500

## 2019-12-30 MED ORDER — INSULIN ASPART 100 UNIT/ML ~~LOC~~ SOLN
0.0000 [IU] | Freq: Three times a day (TID) | SUBCUTANEOUS | Status: DC
  Administered 2019-12-31 – 2020-01-01 (×2): 2 [IU] via SUBCUTANEOUS
  Filled 2019-12-30 (×2): qty 1

## 2019-12-30 MED ORDER — ONDANSETRON HCL 4 MG/2ML IJ SOLN: 4.0000 mg | Freq: Four times a day (QID) | INTRAMUSCULAR | Status: DC | PRN

## 2019-12-30 NOTE — ED Notes (Addendum)
Pharm messaged for med verification  Pt taken phone for call from grandson

## 2019-12-30 NOTE — ED Notes (Signed)
Patient up to the bedside commode with cane.  This RN visualized.

## 2019-12-30 NOTE — Progress Notes (Signed)
PHARMACIST - PHYSICIAN COMMUNICATION  CONCERNING:  Enoxaparin (Lovenox) for DVT Prophylaxis    RECOMMENDATION: Patient was prescribed enoxaprin 40mg  q24 hours for VTE prophylaxis.   Filed Weights   12/30/19 1116  Weight: 127 kg (280 lb)    Body mass index is 51.21 kg/m.  Estimated Creatinine Clearance: 60.8 mL/min (A) (by C-G formula based on SCr of 1.1 mg/dL (H)).   Based on [place] patient is candidate for enoxaparin 40mg  every 12 hour dosing due to BMI being >40.   DESCRIPTION: Pharmacy has adjusted enoxaparin dose per [facility] policy.  Patient is now receiving enoxaparin 40mg  every 12 hours.   Ena Dawley, PharmD Clinical Pharmacist  12/30/2019 10:12 PM
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
## 2019-12-30 NOTE — ED Notes (Addendum)
Call bell answered - pt 1 person assist to toilet, A&O x 4  Pt SOB after toileting, pt requesting food and water  Pt with +1 swelling to both lower legs - pt reports more in the left att  Pt given pillow and blanket
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
## 2019-12-30 NOTE — ED Notes (Signed)
Call bell answered - pt moved to bedside chair

## 2019-12-30 NOTE — ED Provider Notes (Signed)
[facility] Emergency Department Provider Note    First MD Initiated Contact with Patient 12/30/19 1118     (approximate)  I have reviewed the triage vital signs and the nursing notes.   HISTORY  Chief Complaint Altered Mental Status  Level V caveat:  AMS   HPI Glenda Boone is a 69 y.o. female below listed past medical history presents to the ER due to confusion.  Patient able to provide much additional history as to why she is in the ER but thinks that it is 2012.  Denies any focal complaints.  No fevers.    Past Medical History:  Diagnosis Date  . Anxiety   . Bronchitis   . COPD (chronic obstructive pulmonary disease) ([place])   . Depression   . Diabetes mellitus, type II ([place])   . Hyperlipidemia   . Hypertension   . Neuropathy   . Ovarian cancer ([place])   . Shingles   . Thyroid disease    Family History  Problem Relation Age of Onset  . Heart Problems Mother   . Hypertension Mother   . Colon cancer Father   . Arthritis-Osteo Sister    Past Surgical History:  Procedure Laterality Date  . ABDOMINAL HYSTERECTOMY    . ANKLE SURGERY Left   . CHOLECYSTECTOMY    . OVARY SURGERY    . SMALL INTESTINE SURGERY    . TONSILLECTOMY     Patient Active Problem List   Diagnosis Date Noted  . TIA (transient ischemic attack) 09/04/2019  . MDD (major depressive disorder), recurrent, in full remission ([place]) 04/25/2019  . Aphagia 03/09/2019  . Shingles outbreak 06/21/2015  . Chronic bronchitis ([place]) 06/21/2015  . 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[place]) 11/28/2014  . H/O diabetes mellitus 11/28/2014  . Anxiety, generalized 11/28/2014  . H/O hypercholesterolemia 11/28/2014  . H/O: HTN (hypertension) 11/28/2014  . H/O: hypothyroidism 11/28/2014  . Depression, major, recurrent, moderate ([place]) 11/28/2014  . H/O: obesity 11/28/2014  . H/O disease 11/28/2014  . Neuropathy 11/28/2014      Prior to Admission medications   Medication Sig Start Date  End Date Taking? Authorizing Provider  hydrochlorothiazide (HYDRODIURIL) 25 MG tablet Take 25 mg by mouth daily.  06/30/17   [provider]  levothyroxine (SYNTHROID) 75 MCG tablet Take 75 mcg by mouth daily before breakfast. (take with 218mcg tablet to total 267mcg daily)    [provider]  levothyroxine (SYNTHROID, LEVOTHROID) 200 MCG tablet Take 200 mcg by mouth daily before breakfast. (take with 60mcg tablet to equal total of 290mcg daily)    [provider]  losartan (COZAAR) 100 MG tablet Take 100 mg by mouth daily. for high blood pressure 01/09/18   [provider]  metFORMIN (GLUCOPHAGE-XR) 500 MG 24 hr tablet Take 1,500 mg by mouth daily with breakfast. 2 tablets in the morning and one tablet at night. 12/23/17   [provider]  PARoxetine (PAXIL) 20 MG tablet Take 3 tablets (60 mg total) by mouth daily. 04/25/19   [name], MD  simvastatin (ZOCOR) 40 MG tablet Take 40 mg by mouth daily.    [provider]    Allergies Codeine and Demerol [meperidine hcl]    Social History Social History   Tobacco Use  . Smoking status: Current Every Day Smoker    Packs/day: 1.00    Types: Cigarettes    Start date: 12/19/1979  . Smokeless tobacco: Never Used  Substance Use Topics  . Alcohol use: No  Alcohol/week: 0.0 standard drinks  . Drug use: No    Review of Systems Patient denies headaches, rhinorrhea, blurry vision, numbness, shortness of breath, chest pain, edema, cough, abdominal pain, nausea, vomiting, diarrhea, dysuria, fevers, rashes or hallucinations unless otherwise stated above in HPI. ____________________________________________   PHYSICAL EXAM:  VITAL SIGNS: Vitals:   12/30/19 1446 12/30/19 1449  BP: (!) 155/94   Pulse: (!) 102 (!) 103  Resp: (!) 26 (!) 22  Temp:    SpO2: 93% 95%    Constitutional: Alert and disoriented x 2.  Eyes: Conjunctivae are normal.  Head: Atraumatic. Nose: No  congestion/rhinnorhea. Mouth/Throat: Mucous membranes are moist.   Neck: No stridor. Painless ROM.  Cardiovascular: Normal rate, regular rhythm. Grossly normal heart sounds.  Good peripheral circulation. Respiratory: Normal respiratory effort.  No retractions. Lungs with coarse bibasilar breathsounds Gastrointestinal: Soft and nontender. No distention. No abdominal bruits. No CVA tenderness. Genitourinary:  Musculoskeletal: No lower extremity tenderness nor edema.  No joint effusions. Neurologic:  Normal speech and language. No gross focal neurologic deficits are appreciated. No facial droop Skin:  Skin is warm, dry and intact. No rash noted. Psychiatric: Mood and affect are normal. Speech and behavior are normal.  ____________________________________________   LABS (all labs ordered are listed, but only abnormal results are displayed)  Results for orders placed or performed during the hospital encounter of 12/30/19 (from the past 24 hour(s))  CBC with Differential/Platelet     Status: Abnormal   Collection Time: 12/30/19 11:20 AM  Result Value Ref Range   WBC 12.4 (H) 4.0 - 10.5 K/uL   RBC 3.99 3.87 - 5.11 MIL/uL   Hemoglobin 13.2 12.0 - 15.0 g/dL   HCT 38.8 36 - 46 %   MCV 97.2 80.0 - 100.0 fL   MCH 33.1 26.0 - 34.0 pg   MCHC 34.0 30.0 - 36.0 g/dL   RDW 13.4 11.5 - 15.5 %   Platelets 269 150 - 400 K/uL   nRBC 0.0 0.0 - 0.2 %   Neutrophils Relative % 69 %   Neutro Abs 8.7 (H) 1.7 - 7.7 K/uL   Lymphocytes Relative 20 %   Lymphs Abs 2.5 0.7 - 4.0 K/uL   Monocytes Relative 7 %   Monocytes Absolute 0.9 0 - 1 K/uL   Eosinophils Relative 2 %   Eosinophils Absolute 0.3 0 - 0 K/uL   Basophils Relative 1 %   Basophils Absolute 0.1 0 - 0 K/uL   Immature Granulocytes 1 %   Abs Immature Granulocytes 0.07 0.00 - 0.07 K/uL  Comprehensive metabolic panel     Status: Abnormal   Collection Time: 12/30/19 11:20 AM  Result Value Ref Range   Sodium 138 135 - 145 mmol/L   Potassium 4.6 3.5  - 5.1 mmol/L   Chloride 101 98 - 111 mmol/L   CO2 26 22 - 32 mmol/L   Glucose, Bld 121 (H) 70 - 99 mg/dL   BUN 25 (H) 8 - 23 mg/dL   Creatinine, Ser 1.10 (H) 0.44 - 1.00 mg/dL   Calcium 8.5 (L) 8.9 - 10.3 mg/dL   Total Protein 6.9 6.5 - 8.1 g/dL   Albumin 3.5 3.5 - 5.0 g/dL   AST 26 15 - 41 U/L   ALT 14 0 - 44 U/L   Alkaline Phosphatase 55 38 - 126 U/L   Total Bilirubin 1.1 0.3 - 1.2 mg/dL   GFR calc non Af Amer 51 (L) >60 mL/min   GFR calc Af Amer 59 (L) >  60 mL/min   Anion gap 11 5 - 15  Troponin I (High Sensitivity)     Status: None   Collection Time: 12/30/19 11:20 AM  Result Value Ref Range   Troponin I (High Sensitivity) 10 <18 ng/L  Ammonia     Status: None   Collection Time: 12/30/19 12:24 PM  Result Value Ref Range   Ammonia 18 9 - 35 umol/L  SARS Coronavirus 2 by RT PCR (hospital order, performed in [place] hospital lab) Nasopharyngeal Nasopharyngeal Swab     Status: None   Collection Time: 12/30/19 12:24 PM   Specimen: Nasopharyngeal Swab  Result Value Ref Range   SARS Coronavirus 2 NEGATIVE NEGATIVE  Urinalysis, Complete w Microscopic     Status: Abnormal   Collection Time: 12/30/19 12:57 PM  Result Value Ref Range   Color, Urine YELLOW (A) YELLOW   APPearance HAZY (A) CLEAR   Specific Gravity, Urine 1.018 1.005 - 1.030   pH 5.0 5.0 - 8.0   Glucose, UA NEGATIVE NEGATIVE mg/dL   Hgb urine dipstick NEGATIVE NEGATIVE   Bilirubin Urine NEGATIVE NEGATIVE   Ketones, ur NEGATIVE NEGATIVE mg/dL   Protein, ur NEGATIVE NEGATIVE mg/dL   Nitrite NEGATIVE NEGATIVE   Leukocytes,Ua NEGATIVE NEGATIVE   RBC / HPF 0-5 0 - 5 RBC/hpf   WBC, UA 6-10 0 - 5 WBC/hpf   Bacteria, UA FEW (A) NONE SEEN   Squamous Epithelial / LPF 6-10 0 - 5   Mucus PRESENT   Troponin I (High Sensitivity)     Status: None   Collection Time: 12/30/19  2:53 PM  Result Value Ref Range   Troponin I (High Sensitivity) 11 <18 ng/L    ________________________________________________________________________________________  RADIOLOGY  I personally reviewed all radiographic images ordered to evaluate for the above acute complaints and reviewed radiology reports and findings.  These findings were personally discussed with the patient.  Please see medical record for radiology report.  ____________________________________________   PROCEDURES  Procedure(s) performed:  Procedures    Critical Care performed: no ____________________________________________   INITIAL IMPRESSION / ASSESSMENT AND PLAN / ED COURSE  Pertinent labs & imaging results that were available during my care of the patient were reviewed by me and considered in my medical decision making (see chart for details).   DDX: Dehydration, sepsis, pna, uti, hypoglycemia, cva, drug effect, withdrawal, encephalitis   Glenda Boone is a 69 y.o. who presents to the ED with altered mental status as described above.  Similar poor historian.  Medical be sent for the above differential.  The patient will be placed on continuous pulse oximetry and telemetry for monitoring.  Laboratory evaluation will be sent to evaluate for the above complaints.     Clinical Course as of Dec 30 1550  Fri Dec 30, 2019  1428 Blood work is fairly reassuring. Doubt cardiac etiology given her altered mental status and age and signs of chronic microvascular changes on CT head will order MRI to further evaluate.   [PR]  1548 Further review chest x-ray findings are concerning for possible developing early infection.  Covid is negative does have white count.  Is having some wheezing on exam productive cough.  She is tachycardic.  Will discuss with hospitalist for further medical management after ordering blood cultures and IV antibiotics.   Will hold off on MRI at this time as she does not have any focal deficits.   [PR]    Clinical Course User Index [PR] Merlyn Lot, MD  The patient was evaluated in Emergency Department today for the symptoms described in the history of present illness. He/she was evaluated in the context of the global COVID-19 pandemic, which necessitated consideration that the patient might be at risk for infection with the SARS-CoV-2 virus that causes COVID-19. Institutional protocols and algorithms that pertain to the evaluation of patients at risk for COVID-19 are in a state of rapid change based on information released by regulatory bodies including the CDC and federal and state organizations. These policies and algorithms were followed during the patient's care in the ED.  As part of my medical decision making, I reviewed the following data within the [place] notes reviewed and incorporated, Labs reviewed, notes from prior ED visits and [place] Controlled Substance Database   ____________________________________________   FINAL CLINICAL IMPRESSION(S) / ED DIAGNOSES  Final diagnoses:  Altered mental status, unspecified altered mental status type  Pneumonia of lower lobe due to infectious organism, unspecified laterality      NEW MEDICATIONS STARTED DURING THIS VISIT:  New Prescriptions   No medications on file     Note:  This document was prepared using Dragon voice recognition software and may include unintentional dictation errors.    Merlyn Lot, MD 12/30/19 [phone]

## 2019-12-31 DIAGNOSIS — F419 Anxiety disorder, unspecified: Secondary | ICD-10-CM

## 2019-12-31 DIAGNOSIS — E1165 Type 2 diabetes mellitus with hyperglycemia: Secondary | ICD-10-CM | POA: Diagnosis not present

## 2019-12-31 DIAGNOSIS — R9389 Abnormal findings on diagnostic imaging of other specified body structures: Secondary | ICD-10-CM | POA: Diagnosis not present

## 2019-12-31 DIAGNOSIS — J411 Mucopurulent chronic bronchitis: Secondary | ICD-10-CM

## 2019-12-31 DIAGNOSIS — Z590 Homelessness: Secondary | ICD-10-CM

## 2019-12-31 DIAGNOSIS — F329 Major depressive disorder, single episode, unspecified: Secondary | ICD-10-CM

## 2019-12-31 DIAGNOSIS — G9341 Metabolic encephalopathy: Secondary | ICD-10-CM | POA: Diagnosis not present

## 2019-12-31 LAB — BASIC METABOLIC PANEL
Anion gap: 9 (ref 5–15)
BUN: 24 mg/dL — ABNORMAL HIGH (ref 8–23)
CO2: 27 mmol/L (ref 22–32)
Calcium: 8.7 mg/dL — ABNORMAL LOW (ref 8.9–10.3)
Chloride: 103 mmol/L (ref 98–111)
Creatinine, Ser: 0.84 mg/dL (ref 0.44–1.00)
GFR calc Af Amer: >60 mL/min (ref >60)
GFR calc non Af Amer: >60 mL/min (ref >60)
Glucose, Bld: 122 mg/dL — ABNORMAL HIGH (ref 70–99)
Potassium: 4.1 mmol/L (ref 3.5–5.1)
Sodium: 139 mmol/L (ref 135–145)

## 2019-12-31 LAB — PROCALCITONIN: Procalcitonin: <0.1 ng/mL

## 2019-12-31 LAB — CBC
HCT: 36.3 % (ref 36.0–46.0)
Hemoglobin: 12.4 g/dL (ref 12.0–15.0)
MCH: 33.1 pg (ref 26.0–34.0)
MCHC: 34.2 g/dL (ref 30.0–36.0)
MCV: 96.8 fL (ref 80.0–100.0)
Platelets: 243 10*3/uL (ref 150–400)
RBC: 3.75 MIL/uL — ABNORMAL LOW (ref 3.87–5.11)
RDW: 13.5 % (ref 11.5–15.5)
WBC: 11.5 10*3/uL — ABNORMAL HIGH (ref 4.0–10.5)
nRBC: 0 % (ref 0.0–0.2)

## 2019-12-31 LAB — GLUCOSE, CAPILLARY
Glucose-Capillary: 110 mg/dL — ABNORMAL HIGH (ref 70–99)
Glucose-Capillary: 146 mg/dL — ABNORMAL HIGH (ref 70–99)
Glucose-Capillary: 101 mg/dL — ABNORMAL HIGH (ref 70–99)
Glucose-Capillary: 119 mg/dL — ABNORMAL HIGH (ref 70–99)

## 2019-12-31 LAB — BRAIN NATRIURETIC PEPTIDE: B Natriuretic Peptide: 43.9 pg/mL (ref 0.0–100.0)

## 2019-12-31 LAB — HIV ANTIBODY (ROUTINE TESTING W REFLEX): HIV Screen 4th Generation wRfx: NONREACTIVE

## 2019-12-31 MED ORDER — DOXYCYCLINE HYCLATE 100 MG PO TABS
100.0000 mg | ORAL_TABLET | Freq: Two times a day (BID) | ORAL | Status: DC
  Administered 2019-12-31 – 2020-01-01 (×3): 100 mg via ORAL
  Filled 2019-12-31 (×3): qty 1

## 2019-12-31 MED ORDER — LEVOTHYROXINE SODIUM 75 MCG PO TABS
75.0000 ug | ORAL_TABLET | Freq: Every day | ORAL | Status: DC
  Administered 2020-01-01: 75 ug via ORAL
  Filled 2019-12-31: qty 1
  Filled 2019-12-31: qty 3
  Filled 2019-12-31: qty 1

## 2019-12-31 MED ORDER — HYDROCHLOROTHIAZIDE 25 MG PO TABS: 25.0000 mg | ORAL_TABLET | Freq: Every day | ORAL | Status: DC

## 2019-12-31 MED ORDER — ALBUTEROL SULFATE HFA 108 (90 BASE) MCG/ACT IN AERS
2.0000 | INHALATION_SPRAY | Freq: Four times a day (QID) | RESPIRATORY_TRACT | 1 refills | Status: DC | PRN
Start: 2019-12-31 — End: 2019-12-31

## 2019-12-31 MED ORDER — FLUTICASONE-SALMETEROL 250-50 MCG/DOSE IN AEPB
1.0000 | INHALATION_SPRAY | Freq: Two times a day (BID) | RESPIRATORY_TRACT | 1 refills | Status: DC
Start: 2019-12-31 — End: 2019-12-31

## 2019-12-31 MED ORDER — LOSARTAN POTASSIUM 50 MG PO TABS
100.0000 mg | ORAL_TABLET | Freq: Every day | ORAL | Status: DC
  Administered 2019-12-31 – 2020-01-01 (×2): 100 mg via ORAL
  Filled 2019-12-31 (×2): qty 2

## 2019-12-31 MED ORDER — ALBUTEROL SULFATE HFA 108 (90 BASE) MCG/ACT IN AERS
2.0000 | INHALATION_SPRAY | Freq: Four times a day (QID) | RESPIRATORY_TRACT | 1 refills | Status: DC | PRN
Start: 2019-12-31 — End: 2020-01-01

## 2019-12-31 MED ORDER — HYDROCHLOROTHIAZIDE 25 MG PO TABS
25.0000 mg | ORAL_TABLET | Freq: Every day | ORAL | Status: DC
  Administered 2019-12-31 – 2020-01-01 (×2): 25 mg via ORAL
  Filled 2019-12-31 (×2): qty 1

## 2019-12-31 MED ORDER — LOSARTAN POTASSIUM 50 MG PO TABS: 100.0000 mg | ORAL_TABLET | Freq: Every day | ORAL | Status: DC

## 2019-12-31 MED ORDER — MOMETASONE FURO-FORMOTEROL FUM 200-5 MCG/ACT IN AERO: 2.0000 | INHALATION_SPRAY | Freq: Two times a day (BID) | RESPIRATORY_TRACT | 2 refills | Status: DC

## 2019-12-31 MED ORDER — LEVOTHYROXINE SODIUM 200 MCG PO TABS
200.0000 ug | ORAL_TABLET | Freq: Every day | ORAL | Status: DC
  Administered 2020-01-01: 200 ug via ORAL
  Filled 2019-12-31 (×2): qty 1
  Filled 2019-12-31: qty 2

## 2019-12-31 MED ORDER — DOXYCYCLINE HYCLATE 100 MG PO TABS: 100.0000 mg | ORAL_TABLET | Freq: Two times a day (BID) | ORAL | 0 refills | Status: DC

## 2019-12-31 MED ORDER — GABAPENTIN 400 MG PO CAPS
400.0000 mg | ORAL_CAPSULE | Freq: Every day | ORAL | Status: DC
  Filled 2019-12-31: qty 1

## 2019-12-31 MED ORDER — SIMVASTATIN 20 MG PO TABS
40.0000 mg | ORAL_TABLET | Freq: Every day | ORAL | Status: DC
  Administered 2019-12-31 – 2020-01-01 (×2): 40 mg via ORAL
  Filled 2019-12-31 (×2): qty 2

## 2019-12-31 MED ORDER — PAROXETINE HCL 20 MG PO TABS
60.0000 mg | ORAL_TABLET | Freq: Every day | ORAL | Status: DC
  Administered 2019-12-31 – 2020-01-01 (×2): 60 mg via ORAL
  Filled 2019-12-31 (×2): qty 3
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
## 2019-12-31 NOTE — Evaluation (Signed)
Occupational Therapy Evaluation Patient Details Name: Glenda Boone MRN: [id number] DOB: 1951/02/14 Today's Date: 12/31/2019    History of Present Illness Glenda Boone is a 69 y.o. female with history of COPD, DM-2, HTN, ovarian cancer s/p chemo and TAH, tobacco use, & hypothyroidism. Pt was Brought to  the ED by EMS due to altered mental status. Pt recently evicted from her home, has been living in a hotel for 2 months, but was told she cannot return. Uncertain discharge location.   Clinical Impression   Glenda Boone was seen for OT evaluation this date. Pt was modified independent in all ADL and functional mobility. She reports using a SPC for functional mobility, and is independent with bathing, dressing, toileting, and IADL management. Pt currently requires supervision for safety during exertional ADL management due to current functional impairments (See OT Problem List below). Pt educated in energy conservation strategies including pursed lip breathing, activity pacing, home/routines modifications, work simplification, AE/DME, prioritizing of meaningful occupations, and falls prevention. Handout provided. Pt tearful during session regarding discharge circumstances. States "I have never had time to care for myself... Why would God do this to me?". Therapeutic use of self utilized t/o session to provide emotional support and encouragement. RN also notified pt would benefit from chaplain consult. Pt verbalized understanding and would benefit from additional skilled OT services to maximize recall and carryover of learned techniques and facilitate implementation of learned techniques into daily routines. Do not anticipate need for f/u OT services upon hospital DC.        Follow Up Recommendations  No OT follow up    Equipment Recommendations  3 in 1 bedside commode ([facility] provided for pt during session.)    Recommendations for Other Services       Precautions / Restrictions  Precautions Precautions: Fall Precaution Comments: Moderate Fall Restrictions Weight Bearing Restrictions: No      Mobility Bed Mobility               General bed mobility comments: Deferred. Pt up in recliner at start/end of session.  Transfers Overall transfer level: Needs assistance Equipment used: None Transfers: Sit to/from Stand Sit to Stand: Supervision         General transfer comment: Pt stands from recliner to adjust padding. Safe technique with good control and no overt LOB appreciated during evaluation.    Balance Overall balance assessment: History of Falls;Needs assistance Sitting-balance support: Feet supported;No upper extremity supported Sitting balance-Leahy Scale: Good Sitting balance - Comments: Steady sitting, reaching within BOS.   Standing balance support: During functional activity;Single extremity supported Standing balance-Leahy Scale: Good                             ADL either performed or assessed with clinical judgement   ADL Overall ADL's : Needs assistance/impaired                                       General ADL Comments: Pt functionally limited by cardiopulmonary status and generalized weakness. Becomes fatigued/SOB quickly with activities. Supervision for safety for BADL management including functional mobility using a SPC. Pt educated on energy conservation strategies, would benefit from further review/oppotunity to implement during functional tasks.     Vision         Perception     Praxis  Pertinent Vitals/Pain Pain Assessment: No/denies pain     Hand Dominance     Extremity/Trunk Assessment Upper Extremity Assessment Upper Extremity Assessment: Generalized weakness;Overall [facility] for tasks assessed (Grip grossly 4/5, AROM WFL, pt endorses weakness in BUE compared to typical baseline, but generally [facility].)   Lower Extremity Assessment Lower Extremity Assessment: Overall WFL for tasks  assessed       Communication Communication Communication: No difficulties   Cognition Arousal/Alertness: Awake/alert Behavior During Therapy: WFL for tasks assessed/performed Overall Cognitive Status: Within Functional Limits for tasks assessed                                     General Comments  Pt vitals monitored t/o session, remain WFLs with spO2 >94% during evaluation, pt on RA.    Exercises Other Exercises Other Exercises: Pt educated on role of OT in acute setting, falls prevention strategies, safe use of AE/DME for ADL management, and energy conservation strategies including pursed lip breathing, activity pacing, and routines modifications to support safety and fxl indep.   Shoulder Instructions      Home Living Family/patient expects to be discharged to:: Shelter/Homeless                                 Additional Comments: Uncertain discharge disposition. Pt evicted from home, had been living in a hotel, but was told she had to leave on day of admission to hospital.      Prior Functioning/Environment Level of Independence: Independent with assistive device(s)        Comments: Pt uses cane at baseline, rarely out of the home but able to manage all ADL w/o physical assist. Endorses multiple falls in past year which she attributes to losing her balance or her knees "giving out".        OT Problem List: Decreased strength;Cardiopulmonary status limiting activity;Decreased activity tolerance;Decreased safety awareness;Decreased knowledge of use of DME or AE;Impaired balance (sitting and/or standing)      OT Treatment/Interventions: Self-care/ADL training;Therapeutic exercise;Therapeutic activities;DME and/or AE instruction;Patient/family education;Energy conservation;Balance training    OT Goals(Current goals can be found in the care plan section) Acute Rehab OT Goals Patient Stated Goal: To figure out where I'm going to live OT Goal  Formulation: With patient Time For Goal Achievement: 01/14/20 Potential to Achieve Goals: Fair ADL Goals Pt Will Perform Grooming: with modified independence;standing (c LRAD PRN for safety) Pt Will Perform Upper Body Dressing: sitting;with modified independence Pt Will Perform Lower Body Dressing: with modified independence;with adaptive equipment;sit to/from stand (c LRAD PRN for safety) Additional ADL Goal #1: Pt will independently verbalize a plan to implement at least 3 learned energy conservation strategies into her daily routines/living environment for improved safety/functional independence upon hospital DC.  OT Frequency: Min 1X/week   Barriers to D/C:            Co-evaluation              AM-PAC OT "6 Clicks" Daily Activity     Outcome Measure Help from another person eating meals?: None Help from another person taking care of personal grooming?: None Help from another person toileting, which includes using toliet, bedpan, or urinal?: A Little Help from another person bathing (including washing, rinsing, drying)?: A Little Help from another person to put on and taking off regular upper body clothing?: None Help from  another person to put on and taking off regular lower body clothing?: A Little 6 Click Score: 21   End of Session Nurse Communication: Other (comment) (Pt would benefit from chaplain consult.)  Activity Tolerance: Patient tolerated treatment well Patient left: in chair;with call bell/phone within reach;with chair alarm set  OT Visit Diagnosis: Other abnormalities of gait and mobility (R26.89);Muscle weakness (generalized) (M62.81)                Time: 1610-9604 OT Time Calculation (min): 28 min Charges:  OT General Charges $OT Visit: 1 Visit OT Evaluation $OT Eval Low Complexity: 1 Low OT Treatments $Self Care/Home Management : 8-22 mins  Shara Blazing, M.S., OTR/L Ascom: [phone] 12/31/19, 1:50 PM

## 2019-12-31 NOTE — Progress Notes (Signed)
Pt sitting in chair eating food. RN took [employer] to pt. Pt waiting on medications from pharmacy.
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
## 2019-12-31 NOTE — Evaluation (Signed)
Physical Therapy Evaluation Patient Details Name: Glenda Boone MRN: [id number] DOB: [birth year] Today's Date: 12/31/2019   History of Present Illness  Pt is a 69 y.o. female presenting to hospital 8/6 with AMS.  Pt noted with sepsis likely d/t possible community acquired PNA; also transient acute encephalopathy.  Pt reports being recently evicted from her home and has been living in a hotel for 2 months but was told she cannot return.  PMH includes anxiety, COPD, DM, htn, neuropathy, ovarian CA s/p chemo, TAH, shingles, L ankle suergery, MDD.  Clinical Impression  Prior to hospital admission, pt was modified independent ambulating with SPC vs RW depending on situation.  Currently pt is modified independent semi-supine to sitting edge of bed; modified independent with transfers; and SBA ambulating 50 feet with RW.  Overall pt steady and safe with functional mobility.  Limited distance ambulating d/t SOB (O2 sats on room air and HR WFL during sessions activities); pt also reporting she typically only walks up to 50 feet at a time prior to needing sitting rest break.  Pt would benefit from skilled PT to address noted impairments and functional limitations (see below for any additional details).  Upon hospital discharge, pt would benefit from [place] (MD notified that pt reporting no home to discharge to).    Follow Up Recommendations Home health PT    Equipment Recommendations  Other (comment) (pt has RW and SPC already)    Recommendations for Other Services OT consult     Precautions / Restrictions Precautions Precautions: Fall Precaution Comments: Moderate Fall (fall risk score of 9) Restrictions Weight Bearing Restrictions: No      Mobility  Bed Mobility Overal bed mobility: Modified Independent             General bed mobility comments: Semi-supine to sitting edge of bed without any noted difficulty  Transfers Overall transfer level: Modified independent Equipment used:  None Transfers: Sit to/from [facility] Sit to Stand: Modified independent (Device/Increase time) Stand pivot transfers:  (stand step turn bed to recliner with RW)       General transfer comment: mild increased effort to stand from bed x2 trials but steady and safe  Ambulation/Gait Ambulation/Gait assistance: Supervision Gait Distance (Feet): 50 Feet Assistive device: Rolling walker (2 wheeled)   Gait velocity: mildly decreased   General Gait Details: partial step through gait pattern; steady with RW; limited distance d/t SOB  Stairs            Wheelchair Mobility    Modified Rankin (Stroke Patients Only)       Balance Overall balance assessment: History of Falls Sitting-balance support: No upper extremity supported;Feet supported Sitting balance-Leahy Scale: Normal Sitting balance - Comments: steady sitting reaching outside BOS   Standing balance support: No upper extremity supported Standing balance-Leahy Scale: Good Standing balance comment: steady standing reaching within BOS                             Pertinent Vitals/Pain Pain Assessment: No/denies pain Faces Pain Scale: Hurts a little bit Pain Location: R hip Pain Descriptors / Indicators: Sore Pain Intervention(s): Limited activity within patient's tolerance;Monitored during session;Repositioned    Home Living Family/patient expects to be discharged to:: Shelter/Homeless                 Additional Comments: Pt evicted from home and has been living in a hotel but was told she had to leave hotel on  same day as admission to hospital; uncertain discharge disposition    Prior Function Level of Independence: Independent with assistive device(s)         Comments: Pt uses SPC short distances in home but uses RW for longer distances (up to 50 feet typically).  Pt reports last fall (legs buckled) was about 6 months ago.  H/o falls out of bed so pt now sleeps in middle of  bed.     Hand Dominance        Extremity/Trunk Assessment   Upper Extremity Assessment Upper Extremity Assessment: Defer to OT evaluation    Lower Extremity Assessment Lower Extremity Assessment: Generalized weakness    Cervical / Trunk Assessment Cervical / Trunk Assessment: Normal  Communication   Communication: No difficulties  Cognition Arousal/Alertness: Awake/alert Behavior During Therapy: WFL for tasks assessed/performed Overall Cognitive Status: Within Functional Limits for tasks assessed                                        General Comments General comments (skin integrity, edema, etc.): Pt on room air.  While ambulating around bed to get to recliner, therapist moved bed to allow for more space.  Pt's L foot suddenly slipped on floor (pt stayed standing/upright with walker use and therapist provided CGA for safety) and floor noted to have water on it (pt reports spilling cup of water last night and it was cleaned up but must not have cleaned up under bed).  Bed brought to pt to sit down on and floor and pt's shoes dried.  Pt then stood from bed and transferred to recliner (with RW) without any issues.  Therapist double checked floors prior to leaving room and no wetness noted.  Pt reporting no injury when foot slipped.    Exercises    Assessment/Plan    PT Assessment Patient needs continued PT services  PT Problem List Decreased strength;Decreased mobility       PT Treatment Interventions DME instruction;Gait training;Functional mobility training;Therapeutic activities;Therapeutic exercise;Balance training;Patient/family education    PT Goals (Current goals can be found in the Care Plan section)  Acute Rehab PT Goals Patient Stated Goal: to find a place to live PT Goal Formulation: With patient Time For Goal Achievement: 01/14/20 Potential to Achieve Goals: Fair    Frequency Min 2X/week   Barriers to discharge        Co-evaluation                AM-PAC PT "6 Clicks" Mobility  Outcome Measure Help needed turning from your back to your side while in a flat bed without using bedrails?: None Help needed moving from lying on your back to sitting on the side of a flat bed without using bedrails?: None Help needed moving to and from a bed to a chair (including a wheelchair)?: None Help needed standing up from a chair using your arms (e.g., wheelchair or bedside chair)?: None Help needed to walk in hospital room?: A Little Help needed climbing 3-5 steps with a railing? : A Little 6 Click Score: 22    End of Session Equipment Utilized During Treatment: Gait belt Activity Tolerance: Patient tolerated treatment well Patient left: in chair;with call bell/phone within reach (Nurse notified of pt's session and also that pt reporting she has been ambulating to/from bathroom by herself; nurse reporting wanting bed/chair alarm on and pt to call with mobility (nurse  went to pt's room to place alarm)) Nurse Communication: Mobility status;Precautions PT Visit Diagnosis: Other abnormalities of gait and mobility (R26.89);Muscle weakness (generalized) (M62.81);History of falling (Z91.81)    Time: 1045-1110 PT Time Calculation (min) (ACUTE ONLY): 25 min   Charges:   PT Evaluation $PT Eval Low Complexity: 1 Low PT Treatments $Therapeutic Activity: 8-22 mins       Leitha Bleak, PT 12/31/19, 2:54 PM

## 2019-12-31 NOTE — Discharge Summary (Signed)
Physician Discharge Summary  Glenda Boone AOZ:[id number] DOB: Oct 17, 1950 DOA: 12/30/2019  PCP: [facility] [place] date: 12/30/2019 Discharge date: 12/31/2019  Admitted From: Motel Disposition: Shelter  Recommendations for Outpatient Follow-up:  1. Follow ups as below. 2. Please obtain CBC/BMP/Mag at follow up 3. Please follow up on the following pending results: None  Home Health: PT Equipment/Devices: Patient has appropriate DME  Discharge Condition: Stable CODE STATUS: DNR   Hospital Course: 69 y.o. female with history of COPD, DM-2, HTN, ovarian cancer s/p chemo and TAH, tobacco use, hypothyroidism and morbid obesity brought to ED by EMS due to altered mental status following eviction out of motel.  Encephalopathy resolved on admission. She also had some shortness of breath and chronic cough.  WBC 12.4 (chronically elevated).  She had mild tachycardia and mild tachypnea but normal saturation on room air.  COVID-19 PCR negative.  Chest x-ray with nonspecific increased interstitial markings.  BNP within normal.  She was initially started on ceftriaxone and azithromycin for presumed sepsis secondary to pneumonia.  However, procalcitonin remained stable.  Sepsis physiology resolved.  Blood cultures negative.  Sepsis ruled out.  Antibiotic de-escalate to doxycycline.  She was started on LABA/ICS and albuterol for COPD.  Evaluated by therapy who recommended home health PT.  TOC consulted for homelessness and medication assistance, and provided patient with resources.  See individual problem list below for more on hospital course.  Discharge Diagnoses:  Transient acute encephalopathy-seems psychogenic based on her description.  She is now awake, alert and oriented x4.  Doubt syncope.  Basically normal echocardiogram in 02/2019.  CT head and ammonia within normal. -Stable pneumonia as above  SIRS with abnormal chest x-ray-initially thought to have sepsis due to  pneumonia.  Sepsis and pneumonia ruled out.  Chronic COPD: does not seem to be in acute exacerbation.  No PFT in a chart. -Started on Dulera, and albuterol -Doxycycline for 5 days -No indication for steroid. -Encouraged tobacco cessation.  Controlled DM-2 with hyperglycemia: A1c 5.9% on 09/05/2019.    Not on medications at home. -Encourage lifestyle change  Essential hypertension: BP slightly elevated in the setting of holding home antihypertensive. -Resumed home meds  History of ovarian cancer in remission  Hypothyroidism -Continue home Synthroid after med rec  Anxiety and depression: -Continue home meds  Tobacco use disorder: Smokes about half a pack a day. -Encouraged smoking cessation.  Debility/physical deconditioning: Uses walker and cane at baseline. -Home health PT  Social issues: Homeless.  Lived in motel for the last 2 months. Seems she is being evicted from [place] provided by [facility]  Morbid obesity: Body mass index is 51.21 kg/m. -Encouraged lifestyle change to lose weight.   Body mass index is 51.21 kg/m.            Discharge Exam: Vitals:   12/31/19 0600 12/31/19 1000  BP: (!) 186/82 (!) 159/86  Pulse: 60 69  Resp: 17 18  Temp: 98.3 F (36.8 C) 98 F (36.7 C)  SpO2: 98% 94%    GENERAL: No apparent distress.  Nontoxic. HEENT: MMM.  Vision and hearing grossly intact.  NECK: Supple.  No apparent JVD.  RESP: On room air.  No IWOB.  Fair aeration bilaterally. CVS:  RRR. Heart sounds normal.  ABD/GI/GU: Bowel sounds present. Soft. Non tender.  MSK/EXT:  Moves extremities. No apparent deformity. No edema.  SKIN: no apparent skin lesion or wound NEURO: Awake, alert and oriented appropriately.  No apparent focal neuro deficit. PSYCH:  Calm. Normal affect.  Discharge Instructions  Discharge Instructions    Call MD for:  difficulty breathing, headache or visual disturbances   Complete by: As directed    Call MD for:  persistant  nausea and vomiting   Complete by: As directed    Call MD for:  temperature >100.4   Complete by: As directed    Diet - low sodium heart healthy   Complete by: As directed    Discharge instructions   Complete by: As directed    It has been a pleasure taking care of you!  You were hospitalized due to transient mental status change that has resolved.  We have also started you on medication for your COPD that you need to continue taking as prescribed.  We strongly recommend you quit smoking cigarettes.  It is important that you quit smoking cigarettes.  You may use nicotine patch to help you quit smoking.  Nicotine patch is available over-the-counter.  You may also discuss other options to help you quit smoking with your primary care doctor. You can also talk to professional counselors at 1-800-QUIT-NOW 7570289592) for free smoking cessation counseling.     We may have started you on other new medications or made some changes to your home medications during this hospitalization. Please review your new medication list and the directions carefully before you take them.    Please go to your hospital follow-up appointments or call to reschedule as recommended.   Take care,     is poor   Increase activity slowly   Complete by: As directed      Allergies as of 12/31/2019      Reactions   Codeine    Demerol [meperidine Hcl] Anxiety      Medication List    TAKE these medications   albuterol 108 (90 Base) MCG/ACT inhaler Commonly known as: VENTOLIN HFA Inhale 2 puffs into the lungs every 6 (six) hours as needed for wheezing or shortness of breath.   doxycycline 100 MG tablet Commonly known as: VIBRA-TABS Take 1 tablet (100 mg total) by mouth every 12 (twelve) hours.   Fluticasone-Salmeterol 250-50 MCG/DOSE Aepb Commonly known as: Advair Diskus Inhale 1 puff into the lungs in the morning and at bedtime.   gabapentin 400 MG capsule Commonly known as: NEURONTIN Take 400 mg  by mouth at bedtime.   hydrochlorothiazide 25 MG tablet Commonly known as: HYDRODIURIL Take 25 mg by mouth daily.   levothyroxine 200 MCG tablet Commonly known as: SYNTHROID Take 200 mcg by mouth daily before breakfast. (take with 73mcg tablet to equal total of 28mcg daily)   levothyroxine 75 MCG tablet Commonly known as: SYNTHROID Take 75 mcg by mouth daily before breakfast. (take with 266mcg tablet to total 221mcg daily)   losartan 100 MG tablet Commonly known as: COZAAR Take 100 mg by mouth daily. for high blood pressure   PARoxetine 20 MG tablet Commonly known as: PAXIL Take 3 tablets (60 mg total) by mouth daily.   simvastatin 40 MG tablet Commonly known as: ZOCOR Take 40 mg by mouth daily.       Consultations:  None  Procedures/Studies:   CT Head Wo Contrast  Result Date: 12/30/2019 CLINICAL DATA:  Altered mental status EXAM: CT HEAD WITHOUT CONTRAST TECHNIQUE: Contiguous axial images were obtained from the base of the skull through the vertex without intravenous contrast. COMPARISON:  September 04, 2019 head CT and brain MRI September 04, 2019 FINDINGS: Brain: There is slight frontal atrophy bilaterally,  stable. Ventricles and sulci otherwise appear normal in size and configuration, stable. 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 There is no intracranial mass, hemorrhage, extra-axial fluid collection, or midline shift. There is slight small vessel disease adjacent to the frontal horns of the lateral ventricles. There is evidence of a prior infarct in the lateral mid cerebellar hemisphere on the left. No acute infarct is evident. Vascular: No hyperdense vessel. There is calcification in the carotid siphon regions bilaterally. Skull: Bony calvarium appears intact. Sinuses/Orbits: Visualized paranasal sinuses are clear. Orbits appear symmetric bilaterally. Other: Visualized mastoid air cells are clear. IMPRESSION: Stable mild frontal atrophy. Ventricles and sulci normal  in size and configuration. 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 Prior infarct left lateral cerebellum. Minimal periventricular small vessel disease. No acute infarct. No mass or hemorrhage. There are foci of arterial vascular calcification. Electronically Signed   By: Lowella Grip III M.D.   On: 12/30/2019 11:51   DG Chest Portable 1 View  Result Date: 12/30/2019 CLINICAL DATA:  Altered mental status EXAM: PORTABLE CHEST 1 VIEW COMPARISON:  01/11/2012 FINDINGS: Heart size is mildly enlarged, increased from 2013. Atherosclerotic calcification of the aortic knob. Elevation of the right hemidiaphragm. Prominent interstitial markings bilaterally with streaky opacity in the left upper lobe. No pleural effusion or pneumothorax. IMPRESSION: 1. Prominent interstitial markings bilaterally with streaky opacity in the left upper lobe. Findings may represent interstitial edema versus atypical/viral infection. 2. Mild cardiomegaly, increased from 2013. Electronically Signed   By: Davina Poke D.O.   On: 12/30/2019 12:51        The results of significant diagnostics from this hospitalization (including imaging, microbiology, ancillary and laboratory) are listed below for reference.     Microbiology: Recent Results (from the past 240 hour(s))  SARS Coronavirus 2 by RT PCR (hospital order, performed in [facility] hospital lab) Nasopharyngeal Nasopharyngeal Swab     Status: None   Collection Time: 12/30/19 12:24 PM   Specimen: Nasopharyngeal Swab  Result Value Ref Range Status   SARS Coronavirus 2 NEGATIVE NEGATIVE Final    Comment: (NOTE) SARS-CoV-2 target nucleic acids are NOT DETECTED.  The SARS-CoV-2 RNA is generally detectable in upper and lower respiratory specimens during the acute phase of infection. The lowest concentration of SARS-CoV-2 viral copies this assay can detect is 250 copies / mL. A negative result does not preclude  SARS-CoV-2 infection and should not be used as the sole basis for treatment or other patient management decisions.  A negative result may occur with improper specimen collection / handling, submission of specimen other than nasopharyngeal swab, presence of viral mutation(s) within the areas targeted by this assay, and inadequate number of viral copies (<250 copies / mL). A negative result must be combined with clinical observations, patient history, and epidemiological information.  Fact Sheet for Patients:   StrictlyIdeas.no  Fact Sheet for Healthcare Providers: BankingDealers.co.za  This test is not yet approved or  cleared by the [place] FDA and has been authorized for detection and/or diagnosis of SARS-CoV-2 by FDA under an Emergency Use Authorization (EUA).  This EUA will remain in effect (meaning this test can be used) for the duration of the COVID-19 declaration under Section 564(b)(1) of the Act, 21 U.S.C. section 360bbb-3(b)(1), unless the authorization is terminated or revoked sooner.  Performed at [facility], [address], [place], [place] [address]   Blood culture (routine x 2)     Status:  None (Preliminary result)   Collection Time: 12/30/19  4:18 PM   Specimen: BLOOD  Result Value Ref Range Status   Specimen Description BLOOD BLOOD RIGHT HAND  Final   Special Requests   Final    BOTTLES DRAWN AEROBIC AND ANAEROBIC Blood Culture results may not be optimal due to an excessive volume of blood received in culture bottles   Culture   Final    NO GROWTH < 24 HOURS Performed at [facility], [address] [place], [place] [address]    Report Status PENDING  Incomplete  Blood culture (routine x 2)     Status: None (Preliminary result)   Collection Time: 12/30/19  4:18 PM   Specimen: BLOOD  Result Value Ref Range Status   Specimen Description BLOOD RIGHT ANTECUBITAL  Final   Special Requests    Final    BOTTLES DRAWN AEROBIC AND ANAEROBIC Blood Culture adequate volume   Culture   Final    NO GROWTH < 24 HOURS Performed at [facility], [address] [place], [place] [address]    Report Status PENDING  Incomplete     Labs: BNP (last 3 results) Recent Labs    12/31/19 0450  BNP 81.4   Basic Metabolic Panel: Recent Labs  Lab 12/30/19 1120 12/31/19 0450  NA 138 139  K 4.6 4.1  CL 101 103  CO2 26 27  GLUCOSE 121* 122*  BUN 25* 24*  CREATININE 1.10* 0.84  CALCIUM 8.5* 8.7*   Liver Function Tests: Recent Labs  Lab 12/30/19 1120  AST 26  ALT 14  ALKPHOS 55  BILITOT 1.1  PROT 6.9  ALBUMIN 3.5   No results for input(s): LIPASE, AMYLASE in the last 168 hours. Recent Labs  Lab 12/30/19 1224  AMMONIA 18   CBC: Recent Labs  Lab 12/30/19 1120 12/31/19 0450  WBC 12.4* 11.5*  NEUTROABS 8.7*  --   HGB 13.2 12.4  HCT 38.8 36.3  MCV 97.2 96.8  PLT 269 243   Cardiac Enzymes: No results for input(s): CKTOTAL, CKMB, CKMBINDEX, TROPONINI in the last 168 hours. BNP: Invalid input(s): POCBNP CBG: Recent Labs  Lab 12/30/19 1840 12/30/19 2243 12/31/19 0751 12/31/19 1133  GLUCAP 156* 137* 110* 146*   D-Dimer No results for input(s): DDIMER in the last 72 hours. Hgb A1c Recent Labs    12/30/19 1843  HGBA1C 5.9*   Lipid Profile No results for input(s): CHOL, HDL, LDLCALC, TRIG, CHOLHDL, LDLDIRECT in the last 72 hours. Thyroid function studies No results for input(s): TSH, T4TOTAL, T3FREE, THYROIDAB in the last 72 hours.  Invalid input(s): FREET3 Anemia work up No results for input(s): VITAMINB12, FOLATE, FERRITIN, TIBC, IRON, RETICCTPCT in the last 72 hours. Urinalysis    Component Value Date/Time   COLORURINE YELLOW (A) 12/30/2019 1257   APPEARANCEUR HAZY (A) 12/30/2019 1257   LABSPEC 1.018 12/30/2019 1257   PHURINE 5.0 12/30/2019 1257   GLUCOSEU NEGATIVE 12/30/2019 1257   HGBUR NEGATIVE 12/30/2019 1257   BILIRUBINUR NEGATIVE  12/30/2019 1257   KETONESUR NEGATIVE 12/30/2019 1257   PROTEINUR NEGATIVE 12/30/2019 1257   NITRITE NEGATIVE 12/30/2019 1257   LEUKOCYTESUR NEGATIVE 12/30/2019 1257   Sepsis Labs Invalid input(s): PROCALCITONIN,  WBC,  LACTICIDVEN   Time coordinating discharge: 35 minutes  SIGNED:  Mercy Riding, MD  [facility] 12/31/2019, 12:37 PM  If 7PM-7AM, please contact night-coverage [email or web address] Password [email or web address]

## 2020-01-01 LAB — GLUCOSE, CAPILLARY
Glucose-Capillary: 112 mg/dL — ABNORMAL HIGH (ref 70–99)
Glucose-Capillary: 127 mg/dL — ABNORMAL HIGH (ref 70–99)

## 2020-01-01 LAB — PROCALCITONIN: Procalcitonin: <0.1 ng/mL

## 2020-01-01 MED ORDER — ALBUTEROL SULFATE HFA 108 (90 BASE) MCG/ACT IN AERS
2.0000 | INHALATION_SPRAY | Freq: Four times a day (QID) | RESPIRATORY_TRACT | 1 refills | Status: AC | PRN
Start: 2020-01-01 — End: ?

## 2020-01-01 MED ORDER — ALBUTEROL SULFATE HFA 108 (90 BASE) MCG/ACT IN AERS
2.0000 | INHALATION_SPRAY | Freq: Four times a day (QID) | RESPIRATORY_TRACT | Status: DC | PRN
  Administered 2020-01-01: 2 via RESPIRATORY_TRACT
  Filled 2020-01-01 (×2): qty 6.7

## 2020-01-01 MED ORDER — MOMETASONE FURO-FORMOTEROL FUM 200-5 MCG/ACT IN AERO: 2.0000 | INHALATION_SPRAY | Freq: Two times a day (BID) | RESPIRATORY_TRACT | 2 refills | Status: DC

## 2020-01-01 MED ORDER — DOXYCYCLINE HYCLATE 100 MG PO TABS: 100.0000 mg | ORAL_TABLET | Freq: Two times a day (BID) | ORAL | 0 refills | Status: DC

## 2020-01-01 NOTE — Plan of Care (Signed)
Pt anticipates discharge today. Problem: Education: Goal: Knowledge of General Education information will improve Description: Including pain rating scale, medication(s)/side effects and non-pharmacologic comfort measures Outcome: Progressing   Problem: Health Behavior/Discharge Planning: Goal: Ability to manage health-related needs will improve Outcome: Progressing   Problem: Clinical Measurements: Goal: Ability to maintain clinical measurements within normal limits will improve Outcome: Progressing Goal: Will remain free from infection Outcome: Progressing Goal: Diagnostic test results will improve Outcome: Progressing Goal: Respiratory complications will improve Outcome: Progressing Goal: Cardiovascular complication will be avoided Outcome: Progressing   Problem: Activity: Goal: Risk for activity intolerance will decrease Outcome: Progressing   Problem: Nutrition: Goal: Adequate nutrition will be maintained Outcome: Progressing   Problem: Coping: Goal: Level of anxiety will decrease Outcome: Progressing   Problem: Elimination: Goal: Will not experience complications related to bowel motility Outcome: Progressing Goal: Will not experience complications related to urinary retention Outcome: Progressing   Problem: Pain Managment: Goal: General experience of comfort will improve Outcome: Progressing   Problem: Safety: Goal: Ability to remain free from injury will improve Outcome: Progressing   Problem: Skin Integrity: Goal: Risk for impaired skin integrity will decrease Outcome: Progressing   Problem: Activity: Goal: Ability to tolerate increased activity will improve Outcome: Progressing   Problem: Clinical Measurements: Goal: Ability to maintain a body temperature in the normal range will improve Outcome: Progressing   Problem: Respiratory: Goal: Ability to maintain adequate ventilation will improve Outcome: Progressing Goal: Ability to maintain a clear  airway will improve Outcome: Progressing

## 2020-01-01 NOTE — Progress Notes (Signed)
Pt is for DC today. Medication was given to pt, 2 tabs of Doxycycline, 1 new Albuterol and 1 used Dulera. Pt was instructed to get her remaining meds tomorrow in [place]. Pt will leave the floor via taxi using cab voucher provided by case manager.

## 2020-01-02 ENCOUNTER — Other Ambulatory Visit: Payer: Self-pay

## 2020-01-02 ENCOUNTER — Emergency Department
Admission: EM | Admit: 2020-01-02 | Discharge: 2020-01-04 | Disposition: A | Discharge status: Other Acute Inpatient Hospital | Payer: Medicare HMO | Attending: Emergency Medicine | Admitting: Emergency Medicine

## 2020-01-02 DIAGNOSIS — T50902A Poisoning by unspecified drugs, medicaments and biological substances, intentional self-harm, initial encounter: Secondary | ICD-10-CM | POA: Insufficient documentation

## 2020-01-02 DIAGNOSIS — R45851 Suicidal ideations: Secondary | ICD-10-CM | POA: Insufficient documentation

## 2020-01-02 DIAGNOSIS — Z79890 Hormone replacement therapy (postmenopausal): Secondary | ICD-10-CM | POA: Diagnosis not present

## 2020-01-02 DIAGNOSIS — F329 Major depressive disorder, single episode, unspecified: Secondary | ICD-10-CM | POA: Insufficient documentation

## 2020-01-02 DIAGNOSIS — Z20822 Contact with and (suspected) exposure to covid-19: Secondary | ICD-10-CM | POA: Diagnosis not present

## 2020-01-02 DIAGNOSIS — I10 Essential (primary) hypertension: Secondary | ICD-10-CM | POA: Diagnosis not present

## 2020-01-02 DIAGNOSIS — J449 Chronic obstructive pulmonary disease, unspecified: Secondary | ICD-10-CM | POA: Diagnosis not present

## 2020-01-02 DIAGNOSIS — Z79899 Other long term (current) drug therapy: Secondary | ICD-10-CM | POA: Diagnosis not present

## 2020-01-02 DIAGNOSIS — F17210 Nicotine dependence, cigarettes, uncomplicated: Secondary | ICD-10-CM | POA: Insufficient documentation

## 2020-01-02 DIAGNOSIS — E039 Hypothyroidism, unspecified: Secondary | ICD-10-CM | POA: Insufficient documentation

## 2020-01-02 DIAGNOSIS — F332 Major depressive disorder, recurrent severe without psychotic features: Secondary | ICD-10-CM | POA: Diagnosis present

## 2020-01-02 DIAGNOSIS — T1491XA Suicide attempt, initial encounter: Secondary | ICD-10-CM

## 2020-01-02 LAB — CBC WITH DIFFERENTIAL/PLATELET
Abs Immature Granulocytes: 0.07 10*3/uL (ref 0.00–0.07)
Basophils Absolute: 0.1 10*3/uL (ref 0.0–0.1)
Basophils Relative: 1 %
Eosinophils Absolute: 0.4 10*3/uL (ref 0.0–0.5)
Eosinophils Relative: 3 %
HCT: 39.3 % (ref 36.0–46.0)
Hemoglobin: 13.1 g/dL (ref 12.0–15.0)
Immature Granulocytes: 1 %
Lymphocytes Relative: 21 %
Lymphs Abs: 3 10*3/uL (ref 0.7–4.0)
MCH: 32.6 pg (ref 26.0–34.0)
MCHC: 33.3 g/dL (ref 30.0–36.0)
MCV: 97.8 fL (ref 80.0–100.0)
Monocytes Absolute: 1 10*3/uL (ref 0.1–1.0)
Monocytes Relative: 7 %
Neutro Abs: 9.6 10*3/uL — ABNORMAL HIGH (ref 1.7–7.7)
Neutrophils Relative %: 67 %
Platelets: 285 10*3/uL (ref 150–400)
RBC: 4.02 MIL/uL (ref 3.87–5.11)
RDW: 13.5 % (ref 11.5–15.5)
WBC: 14.1 10*3/uL — ABNORMAL HIGH (ref 4.0–10.5)
nRBC: 0 % (ref 0.0–0.2)

## 2020-01-02 LAB — URINE DRUG SCREEN, QUALITATIVE (ARMC ONLY)
Amphetamines, Ur Screen: NOT DETECTED
Barbiturates, Ur Screen: NOT DETECTED
Benzodiazepine, Ur Scrn: NOT DETECTED
Cannabinoid 50 Ng, Ur ~~LOC~~: NOT DETECTED
Cocaine Metabolite,Ur ~~LOC~~: NOT DETECTED
MDMA (Ecstasy)Ur Screen: NOT DETECTED
Methadone Scn, Ur: NOT DETECTED
Opiate, Ur Screen: NOT DETECTED
Phencyclidine (PCP) Ur S: NOT DETECTED
Tricyclic, Ur Screen: NOT DETECTED

## 2020-01-02 LAB — COMPREHENSIVE METABOLIC PANEL
ALT: 20 U/L (ref 0–44)
AST: 22 U/L (ref 15–41)
Albumin: 3.8 g/dL (ref 3.5–5.0)
Alkaline Phosphatase: 59 U/L (ref 38–126)
Anion gap: 11 (ref 5–15)
BUN: 31 mg/dL — ABNORMAL HIGH (ref 8–23)
CO2: 27 mmol/L (ref 22–32)
Calcium: 8.6 mg/dL — ABNORMAL LOW (ref 8.9–10.3)
Chloride: 101 mmol/L (ref 98–111)
Creatinine, Ser: 0.94 mg/dL (ref 0.44–1.00)
GFR calc Af Amer: >60 mL/min (ref >60)
GFR calc non Af Amer: >60 mL/min (ref >60)
Glucose, Bld: 114 mg/dL — ABNORMAL HIGH (ref 70–99)
Potassium: 4.1 mmol/L (ref 3.5–5.1)
Sodium: 139 mmol/L (ref 135–145)
Total Bilirubin: 0.7 mg/dL (ref 0.3–1.2)
Total Protein: 7.3 g/dL (ref 6.5–8.1)

## 2020-01-02 LAB — LACTIC ACID, PLASMA: Lactic Acid, Venous: 1.6 mmol/L (ref 0.5–1.9)

## 2020-01-02 LAB — ACETAMINOPHEN LEVEL: Acetaminophen (Tylenol), Serum: <10 ug/mL — ABNORMAL LOW (ref 10–30)

## 2020-01-02 LAB — ETHANOL: Alcohol, Ethyl (B): <10 mg/dL (ref <10)

## 2020-01-02 LAB — SARS CORONAVIRUS 2 BY RT PCR (HOSPITAL ORDER, PERFORMED IN ~~LOC~~ HOSPITAL LAB): SARS Coronavirus 2: NEGATIVE

## 2020-01-02 LAB — SALICYLATE LEVEL: Salicylate Lvl: <7 mg/dL — ABNORMAL LOW (ref 7.0–30.0)

## 2020-01-02 LAB — GLUCOSE, CAPILLARY: Glucose-Capillary: 119 mg/dL — ABNORMAL HIGH (ref 70–99)

## 2020-01-02 MED ORDER — CHARCOAL ACTIVATED PO LIQD
50.0000 g | Freq: Once | ORAL | Status: AC
  Administered 2020-01-02: 50 g via ORAL

## 2020-01-02 MED ORDER — HYDROCHLOROTHIAZIDE 25 MG PO TABS
25.0000 mg | ORAL_TABLET | Freq: Every day | ORAL | Status: DC
  Administered 2020-01-03 – 2020-01-04 (×2): 25 mg via ORAL
  Filled 2020-01-02 (×2): qty 1

## 2020-01-02 MED ORDER — FLUTICASONE FUROATE-VILANTEROL 200-25 MCG/INH IN AEPB: 1.0000 | INHALATION_SPRAY | Freq: Every day | RESPIRATORY_TRACT | Status: DC

## 2020-01-02 MED ORDER — LEVOTHYROXINE SODIUM 50 MCG PO TABS: 200.0000 ug | ORAL_TABLET | Freq: Every day | ORAL | Status: DC

## 2020-01-02 MED ORDER — ALBUTEROL SULFATE (2.5 MG/3ML) 0.083% IN NEBU: 2.5000 mg | INHALATION_SOLUTION | Freq: Four times a day (QID) | RESPIRATORY_TRACT | Status: DC | PRN

## 2020-01-02 MED ORDER — LEVOTHYROXINE SODIUM 50 MCG PO TABS
75.0000 ug | ORAL_TABLET | Freq: Every day | ORAL | Status: DC
  Administered 2020-01-03 – 2020-01-04 (×2): 75 ug via ORAL
  Filled 2020-01-02 (×2): qty 2

## 2020-01-02 MED ORDER — DOXYCYCLINE HYCLATE 100 MG PO TABS
100.0000 mg | ORAL_TABLET | Freq: Two times a day (BID) | ORAL | Status: DC
  Administered 2020-01-02 – 2020-01-04 (×4): 100 mg via ORAL
  Filled 2020-01-02 (×4): qty 1

## 2020-01-02 MED ORDER — ACTIDOSE WITH SORBITOL 50 GM/240ML PO LIQD
50.0000 g | Freq: Once | ORAL | Status: DC
  Filled 2020-01-02: qty 240

## 2020-01-02 MED ORDER — MOMETASONE FURO-FORMOTEROL FUM 200-5 MCG/ACT IN AERO
2.0000 | INHALATION_SPRAY | Freq: Two times a day (BID) | RESPIRATORY_TRACT | Status: DC
  Administered 2020-01-02 – 2020-01-04 (×4): 2 via RESPIRATORY_TRACT
  Filled 2020-01-02: qty 8.8

## 2020-01-02 MED ORDER — LOSARTAN POTASSIUM 50 MG PO TABS
100.0000 mg | ORAL_TABLET | Freq: Every day | ORAL | Status: DC
  Administered 2020-01-03 – 2020-01-04 (×2): 100 mg via ORAL
  Filled 2020-01-02 (×2): qty 2

## 2020-01-02 MED ORDER — PAROXETINE HCL 30 MG PO TABS
60.0000 mg | ORAL_TABLET | Freq: Every day | ORAL | Status: DC
  Administered 2020-01-03 – 2020-01-04 (×2): 60 mg via ORAL
  Filled 2020-01-02 (×4): qty 2

## 2020-01-02 MED ORDER — METFORMIN HCL ER 500 MG PO TB24
500.0000 mg | ORAL_TABLET | Freq: Every day | ORAL | Status: DC
  Administered 2020-01-03 – 2020-01-04 (×2): 500 mg via ORAL
  Filled 2020-01-02 (×3): qty 1
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
## 2020-01-02 NOTE — ED Notes (Signed)
Pt up to bathroom with assistance.   Bed wet and changed.
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
## 2020-01-02 NOTE — ED Provider Notes (Signed)
Psychiatry advising patient will require inpatient admission.   Delman Kitten, MD 01/02/20 [phone]

## 2020-01-02 NOTE — ED Notes (Signed)
Report off to kim rn  

## 2020-01-02 NOTE — ED Notes (Signed)
Psychatrist in with pt before going to bathroom.

## 2020-01-02 NOTE — ED Notes (Signed)
Resumed care from ally rn  Pt sleeping.  nsr on monitor.

## 2020-01-02 NOTE — ED Notes (Signed)
Gold colored wedding band, blue dress, pink underwear and croc-like shoes placed in labeled belonging bag.

## 2020-01-02 NOTE — ED Notes (Signed)
Pt alert ,calm and ccoperative.
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
## 2020-01-03 LAB — GLUCOSE, CAPILLARY: Glucose-Capillary: 96 mg/dL (ref 70–99)

## 2020-01-03 NOTE — ED Notes (Signed)
Writer was able to fax patients information to the following facilities:    [facility] [place] [place]- [phone]  [phone]    Faxed information should be checked on during day shift.

## 2020-01-03 NOTE — ED Notes (Signed)
Food tray was given with juice. 

## 2020-01-03 NOTE — BH Assessment (Addendum)
PATIENT BED AVAILABLE 01/04/20 AFTER 8AM  Patient has been accepted to [facility].  Patient assigned to [facility] Accepting physician is Dr. Jonelle Sports.  Call report to [phone].  Representative was Aqua.   ER Staff is aware of it:  [facility] ER Secretary  Dr. Archie Balboa, ER MD  [place] Patient's Nurse     Address: [address], [place] [address]

## 2020-01-03 NOTE — ED Notes (Signed)
Pt offered a shower and declined at this time. Pt stated "I took one yesterday before I came." This tech informed pt to let this tech know if she changes her mind. Pt ambulatory to bathroom.

## 2020-01-03 NOTE — BH Assessment (Signed)
Referral Check:   Glenda Nearing- [phone] [name] asked to re-fax; task completed [phone] [name]- [phone] No answer   [name]- [phone] No answer

## 2020-01-03 NOTE — ED Notes (Signed)
Pt given diet soda of choice. Pt reports she is diabetic. Melanie NT to check pt's BG soon. Pt agreeable.

## 2020-01-03 NOTE — ED Provider Notes (Signed)
Emergency Medicine Observation Re-evaluation Note  Glenda Boone is a 69 y.o. female, seen on rounds today.  Pt initially presented to the ED for complaints of Drug Overdose Currently, the patient is resting.  Physical Exam  BP (!) 161/76   Pulse 66   Temp 97.9 F (36.6 C) (Oral)   Resp 18   Ht 5\' 2"  (1.575 m)   Wt 127 kg   SpO2 96%   BMI 51.21 kg/m  Physical Exam  Constitutional: Patient is awake resting comfortably in bed Respiratory: Patient is in no respiratory distress Psych: Patient is not agitated  ED Course / MDM  EKG:  Clinical Course as of Jan 03 736  Mon Jan 02, 2020  1221 Reassuring work-up.  Patient has been attached to the monitor for the past 3 hours without hypotension or disturbances of significance on telemetry.  Will have psych evaluate her.  Medically cleared from my perspective.   [DS]    Clinical Course User Index [DS] Vladimir Crofts, MD   I have reviewed the labs performed to date as well as medications administered while in observation.  Recent changes in the last 24 hours include none. Plan  Current plan is for psych admission  . Patient is under full IVC at this time.   Merlyn Lot, MD 01/03/20 [phone]

## 2020-01-03 NOTE — ED Notes (Signed)
Pt accepted to [facility] for tomorrow morning per TTS who called this RN.

## 2020-01-03 NOTE — ED Notes (Signed)
Pt asleep, breakfast tray placed on sink in bed.

## 2020-01-03 NOTE — ED Notes (Signed)
Pt wheeled to restroom by this RN since [employer] currently out-of-order. Pt had black BM and immediately got anxious but then stated "oh, that's right, I had charcoal earlier". Pt wheeled back to room. Given remote to tv as requested. Denies any other needs.

## 2020-01-03 NOTE — ED Notes (Signed)
Nurse talked with Patient and she talked about all the bad luck she had lately, and since her husband died things had really went down hill, she said she was recently evicted from her apartment and just had no where to go and hopes that she can find somewhere to live to rest her head and have peace, nurse talked to her about hope. Patient is safe at this time.

## 2020-01-03 NOTE — ED Notes (Addendum)
Pt states she feels "alone and desolate". Contracts for safety while in the ER. Pt ate all of her breakfast.

## 2020-01-03 NOTE — ED Notes (Signed)
Patient ate 100% of lunch and beverage, she is pleasant, and calm, no signs of distress, nurse will continue to monitor.

## 2020-01-03 NOTE — ED Notes (Signed)
Patient given snack.  

## 2020-01-03 NOTE — ED Notes (Signed)
Pt given hospital phone for 38mins and cup of water.

## 2020-01-04 LAB — CULTURE, BLOOD (ROUTINE X 2)
Culture: NO GROWTH
Culture: NO GROWTH
Special Requests: ADEQUATE

## 2020-01-04 LAB — GLUCOSE, CAPILLARY: Glucose-Capillary: 90 mg/dL (ref 70–99)

## 2020-01-04 NOTE — ED Notes (Signed)
Pt up to the bathroom in quad bathroom.

## 2020-01-04 NOTE — ED Provider Notes (Signed)
Emergency Medicine Observation Re-evaluation Note  Glenda Boone is a 69 y.o. female, seen on rounds today.  Pt initially presented to the ED for complaints of Drug Overdose Currently, the patient is stable this am with no complaints  Physical Exam  BP (!) 145/47 (BP Location: Right Arm)   Pulse 71   Temp (!) 96.5 F (35.8 C) (Oral)   Resp 18   Ht 5\' 2"  (1.575 m)   Wt 127 kg   SpO2 96%   BMI 51.21 kg/m  Physical Exam   General: No apparent distress HEENT: moist mucous membranes CV: RRR Pulm: Normal WOB GI: soft and non tender MSK: no edema or cyanosis Neuro: face symmetric, moving all extremities    ED Course / MDM   I have reviewed the labs performed to date as well as medications administered while in observation.  No acute events overnight or new labs this morning. Plan  Current plan is for psych dispo.    Alfred Levins, [place], MD 01/04/20 0500

## 2020-01-04 NOTE — ED Notes (Signed)
Pt appears to be sleeping and in NAD. Pt was turning around in bed when RN opened door and it made a noise. Pt is calm.

## 2020-01-04 NOTE — ED Provider Notes (Addendum)
Patient alert, is resting on my entry.  Patient agreeable and understanding with plan to transfer to [facility] this morning.  Vitals:   01/03/20 1002 01/03/20 2008  BP: (!) 155/90 (!) 145/47  Pulse: 78 71  Resp: 18 18  Temp:  (!) 96.5 F (35.8 C)  SpO2: 97% 96%    Patient is alert, stable in no acute distress.   Delman Kitten, MD 01/04/20 0750   Vitals:   01/03/20 2008 01/04/20 0912  BP: (!) 145/47 (!) 164/87  Pulse: 71 74  Resp: 18 20  Temp: (!) 96.5 F (35.8 C) 98.8 F (37.1 C)  SpO2: 96% 94%    ----------------------------------------- 9:15 AM on 01/04/2020 -----------------------------------------   Patient resting comfortably at this time, ready for transfer.  Stable in no distress.  Watching television.   Delman Kitten, MD 01/04/20 [phone]

## 2020-01-04 NOTE — ED Notes (Signed)
Pt states she is doing okay. Pt states she made some bad decisions that brought her in and she is concerned because she has not heard from her daughter and tried calling her earlier. Pt informed that she can try to call her daughter again during phone hours. Pt denies pain. Pt has TV on and states it helps her not feel as lonely.  Pt calm, cooperative, and very pleasant. Pt provided with water to drink.

## 2020-01-25 ENCOUNTER — Telehealth (HOSPITAL_COMMUNITY): Payer: Self-pay | Admitting: *Deleted

## 2020-01-25 NOTE — Telephone Encounter (Signed)
Please tell them that she needs to be seen by a geriatric psychiatrist. You can provide them with contact info of the [facility] office to see if Dr. Shea Evans or Dr. Modesta Messing can see her.

## 2020-01-25 NOTE — Telephone Encounter (Signed)
LVM for Glenda Boone @ [facility] per Provider: Please tell them that she needs to be seen by a geriatric psychiatrist. You can provide them with contact info of the [facility] office to see if Dr. Shea Evans or Dr. Modesta Messing can see her.

## 2020-01-25 NOTE — Telephone Encounter (Signed)
[place]  [phone]  CALLED TO MAKE F/U APPT FOR PATIENT UPON DISCHARGE. INFORMED MS JAMES THAT PATIENT  PREVIOUSLY  CANCELED WITH DR Gretel Acre  & NO  SHOWED ONCE WITH YOU, LAST OFFICE VISIT WITH PATIENT 04-25-2019. AND WITH THAT I WILL NEED TO FOLLOW UP WITH PROVIDER  & WILL RETURN CALL.

## 2020-01-30 ENCOUNTER — Encounter: Payer: Self-pay | Admitting: Emergency Medicine

## 2020-01-30 ENCOUNTER — Inpatient Hospital Stay
Admission: EM | Admit: 2020-01-30 | Discharge: 2020-02-16 | DRG: 177 | Disposition: A | Discharge status: Skilled Nursing Facility | Payer: Medicare HMO | Attending: Hospitalist | Admitting: Hospitalist

## 2020-01-30 ENCOUNTER — Other Ambulatory Visit: Payer: Self-pay

## 2020-01-30 ENCOUNTER — Emergency Department: Payer: Medicare HMO

## 2020-01-30 DIAGNOSIS — J440 Chronic obstructive pulmonary disease with acute lower respiratory infection: Secondary | ICD-10-CM | POA: Diagnosis present

## 2020-01-30 DIAGNOSIS — Z7951 Long term (current) use of inhaled steroids: Secondary | ICD-10-CM

## 2020-01-30 DIAGNOSIS — Z7901 Long term (current) use of anticoagulants: Secondary | ICD-10-CM

## 2020-01-30 DIAGNOSIS — U071 COVID-19: Secondary | ICD-10-CM | POA: Diagnosis not present

## 2020-01-30 DIAGNOSIS — J449 Chronic obstructive pulmonary disease, unspecified: Secondary | ICD-10-CM | POA: Diagnosis present

## 2020-01-30 DIAGNOSIS — J1282 Pneumonia due to coronavirus disease 2019: Secondary | ICD-10-CM | POA: Diagnosis present

## 2020-01-30 DIAGNOSIS — I10 Essential (primary) hypertension: Secondary | ICD-10-CM | POA: Diagnosis present

## 2020-01-30 DIAGNOSIS — E1140 Type 2 diabetes mellitus with diabetic neuropathy, unspecified: Secondary | ICD-10-CM | POA: Diagnosis present

## 2020-01-30 DIAGNOSIS — D6859 Other primary thrombophilia: Secondary | ICD-10-CM | POA: Diagnosis present

## 2020-01-30 DIAGNOSIS — R0682 Tachypnea, not elsewhere classified: Secondary | ICD-10-CM

## 2020-01-30 DIAGNOSIS — Z8543 Personal history of malignant neoplasm of ovary: Secondary | ICD-10-CM

## 2020-01-30 DIAGNOSIS — Z885 Allergy status to narcotic agent status: Secondary | ICD-10-CM

## 2020-01-30 DIAGNOSIS — Z888 Allergy status to other drugs, medicaments and biological substances status: Secondary | ICD-10-CM

## 2020-01-30 DIAGNOSIS — E039 Hypothyroidism, unspecified: Secondary | ICD-10-CM | POA: Diagnosis not present

## 2020-01-30 DIAGNOSIS — E0590 Thyrotoxicosis, unspecified without thyrotoxic crisis or storm: Secondary | ICD-10-CM | POA: Diagnosis present

## 2020-01-30 DIAGNOSIS — Z590 Homelessness: Secondary | ICD-10-CM

## 2020-01-30 DIAGNOSIS — Z79890 Hormone replacement therapy (postmenopausal): Secondary | ICD-10-CM

## 2020-01-30 DIAGNOSIS — Z6841 Body Mass Index (BMI) 40.0 and over, adult: Secondary | ICD-10-CM

## 2020-01-30 DIAGNOSIS — I4891 Unspecified atrial fibrillation: Secondary | ICD-10-CM | POA: Diagnosis not present

## 2020-01-30 DIAGNOSIS — D6869 Other thrombophilia: Secondary | ICD-10-CM | POA: Diagnosis not present

## 2020-01-30 DIAGNOSIS — E66813 Obesity, class 3: Secondary | ICD-10-CM | POA: Diagnosis present

## 2020-01-30 DIAGNOSIS — F17200 Nicotine dependence, unspecified, uncomplicated: Secondary | ICD-10-CM | POA: Diagnosis present

## 2020-01-30 DIAGNOSIS — Z79899 Other long term (current) drug therapy: Secondary | ICD-10-CM

## 2020-01-30 DIAGNOSIS — F419 Anxiety disorder, unspecified: Secondary | ICD-10-CM | POA: Diagnosis present

## 2020-01-30 DIAGNOSIS — F17210 Nicotine dependence, cigarettes, uncomplicated: Secondary | ICD-10-CM | POA: Diagnosis present

## 2020-01-30 DIAGNOSIS — Z91013 Allergy to seafood: Secondary | ICD-10-CM

## 2020-01-30 DIAGNOSIS — I959 Hypotension, unspecified: Secondary | ICD-10-CM | POA: Diagnosis present

## 2020-01-30 DIAGNOSIS — E1111 Type 2 diabetes mellitus with ketoacidosis with coma: Secondary | ICD-10-CM | POA: Diagnosis not present

## 2020-01-30 DIAGNOSIS — E1169 Type 2 diabetes mellitus with other specified complication: Secondary | ICD-10-CM | POA: Diagnosis present

## 2020-01-30 DIAGNOSIS — E785 Hyperlipidemia, unspecified: Secondary | ICD-10-CM | POA: Diagnosis present

## 2020-01-30 DIAGNOSIS — F331 Major depressive disorder, recurrent, moderate: Secondary | ICD-10-CM | POA: Diagnosis present

## 2020-01-30 DIAGNOSIS — Z66 Do not resuscitate: Secondary | ICD-10-CM | POA: Diagnosis present

## 2020-01-30 DIAGNOSIS — R0902 Hypoxemia: Secondary | ICD-10-CM | POA: Diagnosis present

## 2020-01-30 DIAGNOSIS — J9601 Acute respiratory failure with hypoxia: Secondary | ICD-10-CM | POA: Diagnosis present

## 2020-01-30 DIAGNOSIS — Z7984 Long term (current) use of oral hypoglycemic drugs: Secondary | ICD-10-CM

## 2020-01-30 DIAGNOSIS — Z8249 Family history of ischemic heart disease and other diseases of the circulatory system: Secondary | ICD-10-CM

## 2020-01-30 HISTORY — DX: Gastro-esophageal reflux disease without esophagitis: K21.9

## 2020-01-30 HISTORY — DX: Dyspnea, unspecified: R06.00

## 2020-01-30 HISTORY — DX: Hypothyroidism, unspecified: E03.9

## 2020-01-30 LAB — BASIC METABOLIC PANEL
Anion gap: 13 (ref 5–15)
BUN: 29 mg/dL — ABNORMAL HIGH (ref 8–23)
CO2: 24 mmol/L (ref 22–32)
Calcium: 8.3 mg/dL — ABNORMAL LOW (ref 8.9–10.3)
Chloride: 103 mmol/L (ref 98–111)
Creatinine, Ser: 0.98 mg/dL (ref 0.44–1.00)
GFR calc Af Amer: >60 mL/min (ref >60)
GFR calc non Af Amer: 58 mL/min — ABNORMAL LOW (ref >60)
Glucose, Bld: 126 mg/dL — ABNORMAL HIGH (ref 70–99)
Potassium: 3.6 mmol/L (ref 3.5–5.1)
Sodium: 140 mmol/L (ref 135–145)

## 2020-01-30 LAB — CBC
HCT: 33.5 % — ABNORMAL LOW (ref 36.0–46.0)
Hemoglobin: 11.9 g/dL — ABNORMAL LOW (ref 12.0–15.0)
MCH: 34.7 pg — ABNORMAL HIGH (ref 26.0–34.0)
MCHC: 35.5 g/dL (ref 30.0–36.0)
MCV: 97.7 fL (ref 80.0–100.0)
Platelets: 312 10*3/uL (ref 150–400)
RBC: 3.43 MIL/uL — ABNORMAL LOW (ref 3.87–5.11)
RDW: 13.5 % (ref 11.5–15.5)
WBC: 8.3 10*3/uL (ref 4.0–10.5)
nRBC: 0 % (ref 0.0–0.2)

## 2020-01-30 LAB — TROPONIN I (HIGH SENSITIVITY)
Troponin I (High Sensitivity): 12 ng/L (ref <18)
Troponin I (High Sensitivity): 12 ng/L (ref <18)

## 2020-01-30 MED ORDER — SODIUM CHLORIDE 0.9 % IV SOLN
100.0000 mg | Freq: Every day | INTRAVENOUS | Status: AC
  Administered 2020-01-31 – 2020-02-03 (×4): 100 mg via INTRAVENOUS
  Filled 2020-01-30 (×5): qty 20

## 2020-01-30 MED ORDER — SODIUM CHLORIDE 0.9 % IV SOLN
200.0000 mg | Freq: Once | INTRAVENOUS | Status: AC
  Administered 2020-01-31: 200 mg via INTRAVENOUS
  Filled 2020-01-30: qty 200

## 2020-01-30 MED ORDER — DEXAMETHASONE SODIUM PHOSPHATE 10 MG/ML IJ SOLN
6.0000 mg | INTRAMUSCULAR | Status: DC
  Administered 2020-01-31: 6 mg via INTRAVENOUS
  Filled 2020-01-30: qty 1

## 2020-01-30 MED ORDER — ONDANSETRON HCL 4 MG/2ML IJ SOLN
4.0000 mg | Freq: Once | INTRAMUSCULAR | Status: AC
  Administered 2020-01-31: 4 mg via INTRAVENOUS
  Filled 2020-01-30: qty 2

## 2020-01-30 MED ORDER — IPRATROPIUM-ALBUTEROL 0.5-2.5 (3) MG/3ML IN SOLN: 3.0000 mL | Freq: Once | RESPIRATORY_TRACT | Status: DC

## 2020-01-30 NOTE — ED Triage Notes (Signed)
Pt comes into the ED via EMS from home with c/o SOB x2 days states she tested positive for covid on Friday. 146/70, 89RA, 96%3L [place], SR 73.Marland Kitchen   Hx of COPD

## 2020-01-30 NOTE — ED Notes (Signed)
Pt ambulated with cane and O2 sat remains 90-94% on RA. Denies lightheaded or dizziness, increased wob was present at end of ambulation trial.

## 2020-01-30 NOTE — ED Triage Notes (Signed)
Pt reports was at [facility] and they tested her 3 times last week for covid and all were positive. First test Monday, last one Friday.  She is on inhaler.  sats 95% RA in triage, arrived on O2 with EMS; pt placed back on 2 L, feels like breathing better.  Here today for [facility], fatigue.   Respirations are unlabored at this time during triage assessment.

## 2020-01-30 NOTE — ED Provider Notes (Signed)
[facility] Emergency Department Provider Note   ____________________________________________   First MD Initiated Contact with Patient 01/30/20 2312     (approximate)  I have reviewed the triage vital signs and the nursing notes.   HISTORY  Chief Complaint Shortness of Breath    HPI Glenda Boone is a 69 y.o. female brought to the ED via EMS from home with a chief complaint of shortness of breath.  Patient was at [facility] last week for depression and tested positive for Covid on 01/23/2020.  She was released on Friday and felt worse over the weekend.  History of diabetes type 2, hyperlipidemia, hypertension, COPD not on continuous oxygen.  Reports generalized malaise, nonproductive cough, chest pain, nausea and shortness of breath.  Patient is unvaccinated against COVID-19.      Past Medical History:  Diagnosis Date  . Anxiety   . Bronchitis   . COPD (chronic obstructive pulmonary disease) ([place])   . Depression   . Diabetes mellitus, type II ([place])   . Dyspnea   . GERD (gastroesophageal reflux disease)   . Hyperlipidemia   . Hypertension   . Hypothyroidism   . Neuropathy   . Ovarian cancer ([place])   . Shingles   . Thyroid disease     Patient Active Problem List   Diagnosis Date Noted  . Pneumonia due to COVID-19 virus 01/31/2020  . COPD (chronic obstructive pulmonary disease) ([place]) 01/31/2020  . Diabetic ketoacidosis with coma associated with type 2 diabetes mellitus ([place]) 01/31/2020  . Essential hypertension 01/31/2020  . Hypoxia 01/31/2020  . Obesity, Class III, BMI 40-49.9 (morbid obesity) ([place]) 01/31/2020  . Acute metabolic encephalopathy 46/56/8127  . TIA (transient ischemic attack) 09/04/2019  . MDD (major depressive disorder), recurrent, in full remission ([place]) 04/25/2019  . Aphagia 03/09/2019  . Shingles outbreak 06/21/2015  . Chronic bronchitis ([place]) 06/21/2015  . 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[place]) 11/28/2014  . H/O  diabetes mellitus 11/28/2014  . Anxiety, generalized 11/28/2014  . H/O hypercholesterolemia 11/28/2014  . H/O: HTN (hypertension) 11/28/2014  . H/O: hypothyroidism 11/28/2014  . Depression, major, recurrent, moderate ([place]) 11/28/2014  . H/O: obesity 11/28/2014  . H/O disease 11/28/2014  . Neuropathy 11/28/2014    Past Surgical History:  Procedure Laterality Date  . ABDOMINAL HYSTERECTOMY    . ANKLE SURGERY Left   . CHOLECYSTECTOMY    . OVARY SURGERY    . SMALL INTESTINE SURGERY    . TONSILLECTOMY      Prior to Admission medications   Medication Sig Start Date End Date Taking? Authorizing Provider  ADVAIR DISKUS 250-50 MCG/DOSE AEPB Inhale 1 puff into the lungs 2 (two) times daily. 12/31/19   [provider]  albuterol (VENTOLIN HFA) 108 (90 Base) MCG/ACT inhaler Inhale 2 puffs into the lungs every 6 (six) hours as needed for wheezing or shortness of breath. 01/01/20   Mercy Riding, MD  doxycycline (VIBRA-TABS) 100 MG tablet Take 1 tablet (100 mg total) by mouth every 12 (twelve) hours. 01/01/20   Mercy Riding, MD  hydrochlorothiazide (HYDRODIURIL) 25 MG tablet Take 25 mg by mouth daily.  06/30/17   [provider]  levothyroxine (SYNTHROID) 75 MCG tablet Take 75 mcg by mouth daily before breakfast. (take with 238mcg tablet to total 23mcg daily)    [provider]  levothyroxine (SYNTHROID, LEVOTHROID) 200 MCG tablet Take 200 mcg by mouth daily before breakfast. (take with 53mcg tablet to equal total of 237mcg daily)    [provider]  losartan (COZAAR) 100 MG tablet Take 100 mg by mouth daily. for high blood pressure 01/09/18   [provider]  metFORMIN (GLUCOPHAGE-XR) 500 MG 24 hr tablet TAKE 2 TABLETS BY MOUTH TWICE DAILY FOR DIABETES    [provider]  mometasone-formoterol (DULERA) 200-5 MCG/ACT AERO Inhale 2 puffs into the lungs 2 (two) times daily. 01/01/20   Mercy Riding, MD  PARoxetine (PAXIL) 20 MG tablet Take 3 tablets (60 mg  total) by mouth daily. 04/25/19   [name], MD  simvastatin (ZOCOR) 40 MG tablet Take 40 mg by mouth daily.    [provider]    Allergies Codeine, Shellfish allergy, Demerol [meperidine hcl], and Gabapentin  Family History  Problem Relation Age of Onset  . Heart Problems Mother   . Hypertension Mother   . Colon cancer Father   . Arthritis-Osteo Sister     Social History Social History   Tobacco Use  . Smoking status: Current Every Day Smoker    Packs/day: 1.00    Types: Cigarettes    Start date: 12/19/1979  . Smokeless tobacco: Never Used  Substance Use Topics  . Alcohol use: No    Alcohol/week: 0.0 standard drinks  . Drug use: No    Review of Systems  Constitutional: Positive for generalized malaise.  No fever/chills Eyes: No visual changes. ENT: No sore throat. Cardiovascular: Positive for chest pain. Respiratory: Positive for cough and shortness of breath. Gastrointestinal: No abdominal pain.  Positive for nausea, no vomiting.  No diarrhea.  No constipation. Genitourinary: Negative for dysuria. Musculoskeletal: Negative for back pain. Skin: Negative for rash. Neurological: Negative for headaches, focal weakness or numbness.   ____________________________________________   PHYSICAL EXAM:  VITAL SIGNS: ED Triage Vitals  Enc Vitals Group     BP 01/30/20 1646 120/74     Pulse Rate 01/30/20 1646 94     Resp 01/30/20 1646 19     Temp 01/30/20 1646 98.9 F (37.2 C)     Temp Source 01/30/20 1646 Oral     SpO2 01/30/20 1646 96 %     Weight 01/30/20 1648 270 lb (122.5 kg)     Height 01/30/20 1648 5\' 2"  (1.575 m)     Head Circumference --      Peak Flow --      Pain Score --      Pain Loc --      Pain Edu? --      Excl. in [place]? --     Constitutional: Alert and oriented. Chronically ill-appearing and in mild acute distress. Eyes: Conjunctivae are normal. PERRL. EOMI. Head: Atraumatic. Nose: No congestion/rhinnorhea. Mouth/Throat: Mucous  membranes are mildly dry.   Neck: No stridor.   Cardiovascular: Normal rate, regular rhythm. Grossly normal heart sounds.  Good peripheral circulation. Respiratory: Increased respiratory effort.  Mild retractions. Lungs with scattered wheezing. Gastrointestinal: Soft and nontender. No distention. No abdominal bruits. No CVA tenderness. Musculoskeletal: No lower extremity tenderness nor edema.  No joint effusions. Neurologic:  Normal speech and language. No gross focal neurologic deficits are appreciated. No gait instability. Skin:  Skin is warm, dry and intact. No rash noted.  No petechiae. Psychiatric: Mood and affect are normal. Speech and behavior are normal.  ____________________________________________   LABS (all labs ordered are listed, but only abnormal results are displayed)  Labs Reviewed  BASIC METABOLIC PANEL - Abnormal; Notable for the following components:      Result Value   Glucose, Bld 126 (*)  BUN 29 (*)    Calcium 8.3 (*)    GFR calc non Af Amer 58 (*)    All other components within normal limits  CBC - Abnormal; Notable for the following components:   RBC 3.43 (*)    Hemoglobin 11.9 (*)    HCT 33.5 (*)    MCH 34.7 (*)    All other components within normal limits  C-REACTIVE PROTEIN  HEPATITIS B SURFACE ANTIGEN  BRAIN NATRIURETIC PEPTIDE  FIBRIN DERIVATIVES D-DIMER ([facility] ONLY)  FERRITIN  FIBRINOGEN  LACTATE DEHYDROGENASE  PROCALCITONIN  ABO/RH  TROPONIN I (HIGH SENSITIVITY)  TROPONIN I (HIGH SENSITIVITY)   ____________________________________________  EKG  ED ECG REPORT I, Aleeta Schmaltz J, the attending physician, personally viewed and interpreted this ECG.   Date: 01/30/2020  EKG Time: 0031  Rate: 86  Rhythm: normal EKG, normal sinus rhythm  Axis: Normal  Intervals:none  ST&T Change: Nonspecific  ____________________________________________  RADIOLOGY  ED MD interpretation: Consistent with COVID-19 pneumonia  Official radiology  report(s): DG Chest 2 View  Result Date: 01/30/2020 CLINICAL DATA:  Shortness of breath.  COVID positive. EXAM: CHEST - 2 VIEW COMPARISON:  Radiograph 12/30/2019 FINDINGS: Chronic elevation of right hemidiaphragm. Upper normal heart size with unchanged mediastinal contours. Aortic atherosclerosis. Patchy heterogeneous bilateral airspace opacities in a mid-lower lung zone predominant distribution, left greater than right. No pneumothorax or large pleural effusion. Scoliotic curvature of spine. No acute osseous abnormalities are seen. IMPRESSION: Patchy heterogeneous bilateral airspace opacities in a mid-lower lung zone predominant distribution, left greater than right, consistent with COVID-19 pneumonia. Electronically Signed   By: Keith Rake M.D.   On: 01/30/2020 17:57    ____________________________________________   PROCEDURES  Procedure(s) performed (including Critical Care):  .1-3 Lead EKG Interpretation Performed by: Paulette Blanch, MD Authorized by: Paulette Blanch, MD     Interpretation: normal     ECG rate:  81   ECG rate assessment: normal     Rhythm: sinus rhythm     Ectopy: none     Conduction: normal   Comments:     Patient placed on cardiac monitor to evaluate for arrhythmias     ____________________________________________   INITIAL IMPRESSION / ASSESSMENT AND PLAN / ED COURSE  As part of my medical decision making, I reviewed the following data within the [place] notes reviewed and incorporated, Labs reviewed, EKG interpreted, Old chart reviewed, Radiograph reviewed, Discussed with admitting physician and Notes from prior ED visits     Suzzane Quilter Biernat was evaluated in Emergency Department on 01/31/2020 for the symptoms described in the history of present illness. She was evaluated in the context of the global COVID-19 pandemic, which necessitated consideration that the patient might be at risk for infection with the SARS-CoV-2 virus that  causes COVID-19. Institutional protocols and algorithms that pertain to the evaluation of patients at risk for COVID-19 are in a state of rapid change based on information released by regulatory bodies including the CDC and federal and state organizations. These policies and algorithms were followed during the patient's care in the ED.    69 year old female with diabetes, hypertension and COPD not on chronic oxygenation presenting with worsening cough and shortness of breath since her Covid diagnosis on 01/23/2020. Differential includes, but is not limited to, viral syndrome, bronchitis including COPD exacerbation, pneumonia, reactive airway disease including asthma, CHF including exacerbation with or without pulmonary/interstitial edema, pneumothorax, ACS, thoracic trauma, and pulmonary embolism.  X-ray consistent with Covid pneumonia.  Patient mildly tachypneic at rest; on 2 L nasal cannula oxygen she is 97 to 98%.  Speaks 2-3 sentences then stops to take a deep breath.  Will perform ambulation trial.   Clinical Course as of Jan 30 245  Mon Jan 30, 2020  2343 Patient became quite tachypneic upon ambulation on room air to respiratory rate 35; room air saturations 89 to 90%.  Placed on 2 L nasal cannula oxygen.  Will administer DuoNeb, start IV Decadron and Remdesivir.  Will discuss with hospitalist services for admission.   [JS]    Clinical Course User Index [JS] Paulette Blanch, MD     ____________________________________________   FINAL CLINICAL IMPRESSION(S) / ED DIAGNOSES  Final diagnoses:  Pneumonia due to COVID-19 virus  Chronic obstructive pulmonary disease, unspecified COPD type ([place])  Tachypnea  Hypoxia     ED Discharge Orders    None       Note:  This document was prepared using Dragon voice recognition software and may include unintentional dictation errors.   Paulette Blanch, MD 01/31/20 [phone]

## 2020-01-30 NOTE — ED Notes (Signed)
Assumed care of pt upon being roomed. Denies needs. States increased sob that eases after breathing treatments and with 2L [place]. O2 sat 94-95% on 2L [place], talking in full sentences with stopping to take a deep inhalation per 2-3 sentences. AO x4.

## 2020-01-30 NOTE — Progress Notes (Signed)
Remdesivir - Pharmacy Brief Note   O:  CXR: IMPRESSION: Patchy heterogeneous bilateral airspace opacities in a mid-lower lung zone predominant distribution, left greater than right, consistent with COVID-19 pneumonia. SpO2: 91 - 96% on 2L [place]   A/P:  Remdesivir 200 mg IVPB once followed by 100 mg IVPB daily x 4 days.   Tobie Lords, PharmD, BCPS Clinical Pharmacist 01/30/2020 11:56 PM

## 2020-01-31 ENCOUNTER — Encounter: Payer: Self-pay | Admitting: Family Medicine

## 2020-01-31 DIAGNOSIS — E1111 Type 2 diabetes mellitus with ketoacidosis with coma: Secondary | ICD-10-CM | POA: Diagnosis not present

## 2020-01-31 DIAGNOSIS — Z6841 Body Mass Index (BMI) 40.0 and over, adult: Secondary | ICD-10-CM | POA: Diagnosis not present

## 2020-01-31 DIAGNOSIS — E0590 Thyrotoxicosis, unspecified without thyrotoxic crisis or storm: Secondary | ICD-10-CM | POA: Diagnosis not present

## 2020-01-31 DIAGNOSIS — D6859 Other primary thrombophilia: Secondary | ICD-10-CM | POA: Diagnosis present

## 2020-01-31 DIAGNOSIS — U071 COVID-19: Secondary | ICD-10-CM | POA: Diagnosis present

## 2020-01-31 DIAGNOSIS — I4891 Unspecified atrial fibrillation: Secondary | ICD-10-CM | POA: Diagnosis not present

## 2020-01-31 DIAGNOSIS — E1140 Type 2 diabetes mellitus with diabetic neuropathy, unspecified: Secondary | ICD-10-CM | POA: Diagnosis present

## 2020-01-31 DIAGNOSIS — Z7951 Long term (current) use of inhaled steroids: Secondary | ICD-10-CM | POA: Diagnosis not present

## 2020-01-31 DIAGNOSIS — I10 Essential (primary) hypertension: Secondary | ICD-10-CM | POA: Diagnosis present

## 2020-01-31 DIAGNOSIS — J449 Chronic obstructive pulmonary disease, unspecified: Secondary | ICD-10-CM | POA: Diagnosis present

## 2020-01-31 DIAGNOSIS — E1169 Type 2 diabetes mellitus with other specified complication: Secondary | ICD-10-CM | POA: Diagnosis not present

## 2020-01-31 DIAGNOSIS — E785 Hyperlipidemia, unspecified: Secondary | ICD-10-CM | POA: Diagnosis present

## 2020-01-31 DIAGNOSIS — R0682 Tachypnea, not elsewhere classified: Secondary | ICD-10-CM | POA: Diagnosis present

## 2020-01-31 DIAGNOSIS — J1282 Pneumonia due to coronavirus disease 2019: Secondary | ICD-10-CM | POA: Diagnosis present

## 2020-01-31 DIAGNOSIS — Z66 Do not resuscitate: Secondary | ICD-10-CM | POA: Diagnosis present

## 2020-01-31 DIAGNOSIS — E039 Hypothyroidism, unspecified: Secondary | ICD-10-CM | POA: Diagnosis present

## 2020-01-31 DIAGNOSIS — F17210 Nicotine dependence, cigarettes, uncomplicated: Secondary | ICD-10-CM | POA: Diagnosis present

## 2020-01-31 DIAGNOSIS — R0902 Hypoxemia: Secondary | ICD-10-CM | POA: Diagnosis present

## 2020-01-31 DIAGNOSIS — Z79890 Hormone replacement therapy (postmenopausal): Secondary | ICD-10-CM | POA: Diagnosis not present

## 2020-01-31 DIAGNOSIS — F331 Major depressive disorder, recurrent, moderate: Secondary | ICD-10-CM | POA: Diagnosis present

## 2020-01-31 DIAGNOSIS — J9601 Acute respiratory failure with hypoxia: Secondary | ICD-10-CM | POA: Diagnosis present

## 2020-01-31 DIAGNOSIS — Z590 Homelessness: Secondary | ICD-10-CM | POA: Diagnosis not present

## 2020-01-31 DIAGNOSIS — I959 Hypotension, unspecified: Secondary | ICD-10-CM | POA: Diagnosis present

## 2020-01-31 DIAGNOSIS — Z7901 Long term (current) use of anticoagulants: Secondary | ICD-10-CM | POA: Diagnosis not present

## 2020-01-31 DIAGNOSIS — J440 Chronic obstructive pulmonary disease with acute lower respiratory infection: Secondary | ICD-10-CM | POA: Diagnosis present

## 2020-01-31 DIAGNOSIS — Z79899 Other long term (current) drug therapy: Secondary | ICD-10-CM | POA: Diagnosis not present

## 2020-01-31 DIAGNOSIS — E66813 Obesity, class 3: Secondary | ICD-10-CM | POA: Diagnosis present

## 2020-01-31 DIAGNOSIS — F419 Anxiety disorder, unspecified: Secondary | ICD-10-CM | POA: Diagnosis present

## 2020-01-31 HISTORY — DX: COVID-19: U07.1

## 2020-01-31 LAB — CBC
HCT: 31.8 % — ABNORMAL LOW (ref 36.0–46.0)
Hemoglobin: 11.2 g/dL — ABNORMAL LOW (ref 12.0–15.0)
MCH: 33.9 pg (ref 26.0–34.0)
MCHC: 35.2 g/dL (ref 30.0–36.0)
MCV: 96.4 fL (ref 80.0–100.0)
Platelets: 268 10*3/uL (ref 150–400)
RBC: 3.3 MIL/uL — ABNORMAL LOW (ref 3.87–5.11)
RDW: 13.5 % (ref 11.5–15.5)
WBC: 7.8 10*3/uL (ref 4.0–10.5)
nRBC: 0 % (ref 0.0–0.2)

## 2020-01-31 LAB — FERRITIN: Ferritin: 842 ng/mL — ABNORMAL HIGH (ref 11–307)

## 2020-01-31 LAB — GLUCOSE, CAPILLARY
Glucose-Capillary: 274 mg/dL — ABNORMAL HIGH (ref 70–99)
Glucose-Capillary: 330 mg/dL — ABNORMAL HIGH (ref 70–99)
Glucose-Capillary: 214 mg/dL — ABNORMAL HIGH (ref 70–99)
Glucose-Capillary: 190 mg/dL — ABNORMAL HIGH (ref 70–99)

## 2020-01-31 LAB — LACTATE DEHYDROGENASE: LDH: 188 U/L (ref 98–192)

## 2020-01-31 LAB — CREATININE, SERUM
Creatinine, Ser: 1.21 mg/dL — ABNORMAL HIGH (ref 0.44–1.00)
GFR calc Af Amer: 52 mL/min — ABNORMAL LOW (ref >60)
GFR calc non Af Amer: 45 mL/min — ABNORMAL LOW (ref >60)

## 2020-01-31 LAB — PROCALCITONIN: Procalcitonin: 0.15 ng/mL

## 2020-01-31 LAB — BRAIN NATRIURETIC PEPTIDE: B Natriuretic Peptide: 52 pg/mL (ref 0.0–100.0)

## 2020-01-31 LAB — FIBRIN DERIVATIVES D-DIMER (ARMC ONLY): Fibrin derivatives D-dimer (ARMC): 1290.64 ng/mL (FEU) — ABNORMAL HIGH (ref 0.00–499.00)

## 2020-01-31 LAB — ABO/RH: ABO/RH(D): A POS

## 2020-01-31 LAB — SARS CORONAVIRUS 2 BY RT PCR (HOSPITAL ORDER, PERFORMED IN ~~LOC~~ HOSPITAL LAB): SARS Coronavirus 2: POSITIVE — AB

## 2020-01-31 LAB — HEPATITIS B SURFACE ANTIGEN: Hepatitis B Surface Ag: NONREACTIVE

## 2020-01-31 LAB — C-REACTIVE PROTEIN: CRP: 9.4 mg/dL — ABNORMAL HIGH (ref <1.0)

## 2020-01-31 LAB — FIBRINOGEN: Fibrinogen: >750 mg/dL — ABNORMAL HIGH (ref 210–475)

## 2020-01-31 MED ORDER — PREDNISONE 50 MG PO TABS
50.0000 mg | ORAL_TABLET | Freq: Every day | ORAL | Status: DC
  Administered 2020-02-03 – 2020-02-07 (×5): 50 mg via ORAL
  Filled 2020-01-31 (×5): qty 1

## 2020-01-31 MED ORDER — NICOTINE 14 MG/24HR TD PT24
14.0000 mg | MEDICATED_PATCH | Freq: Every day | TRANSDERMAL | Status: DC
  Administered 2020-01-31 – 2020-02-16 (×17): 14 mg via TRANSDERMAL
  Filled 2020-01-31 (×18): qty 1

## 2020-01-31 MED ORDER — GUAIFENESIN-DM 100-10 MG/5ML PO SYRP
10.0000 mL | ORAL_SOLUTION | ORAL | Status: DC | PRN
  Filled 2020-01-31: qty 10

## 2020-01-31 MED ORDER — MOMETASONE FURO-FORMOTEROL FUM 200-5 MCG/ACT IN AERO
2.0000 | INHALATION_SPRAY | Freq: Two times a day (BID) | RESPIRATORY_TRACT | Status: DC
  Administered 2020-01-31 – 2020-02-16 (×32): 2 via RESPIRATORY_TRACT
  Filled 2020-01-31: qty 8.8

## 2020-01-31 MED ORDER — PROMETHAZINE HCL 25 MG PO TABS
12.5000 mg | ORAL_TABLET | Freq: Four times a day (QID) | ORAL | Status: DC | PRN
  Filled 2020-01-31: qty 1

## 2020-01-31 MED ORDER — ACETAMINOPHEN 325 MG PO TABS
650.0000 mg | ORAL_TABLET | Freq: Four times a day (QID) | ORAL | Status: DC | PRN
  Administered 2020-02-13: 650 mg via ORAL
  Filled 2020-01-31: qty 2

## 2020-01-31 MED ORDER — LEVOTHYROXINE SODIUM 100 MCG PO TABS
200.0000 ug | ORAL_TABLET | Freq: Every day | ORAL | Status: DC
  Administered 2020-01-31 – 2020-02-10 (×11): 200 ug via ORAL
  Filled 2020-01-31 (×11): qty 2

## 2020-01-31 MED ORDER — LOSARTAN POTASSIUM 50 MG PO TABS
100.0000 mg | ORAL_TABLET | Freq: Every day | ORAL | Status: DC
  Administered 2020-01-31 – 2020-02-09 (×10): 100 mg via ORAL
  Filled 2020-01-31 (×10): qty 2

## 2020-01-31 MED ORDER — ENOXAPARIN SODIUM 40 MG/0.4ML ~~LOC~~ SOLN
40.0000 mg | Freq: Two times a day (BID) | SUBCUTANEOUS | Status: DC
  Administered 2020-01-31 – 2020-02-09 (×19): 40 mg via SUBCUTANEOUS
  Filled 2020-01-31 (×19): qty 0.4

## 2020-01-31 MED ORDER — METHYLPREDNISOLONE SODIUM SUCC 125 MG IJ SOLR
1.0000 mg/kg | Freq: Two times a day (BID) | INTRAMUSCULAR | Status: AC
  Administered 2020-01-31 – 2020-02-02 (×6): 122.5 mg via INTRAVENOUS
  Filled 2020-01-31 (×4): qty 2

## 2020-01-31 MED ORDER — ALBUTEROL SULFATE HFA 108 (90 BASE) MCG/ACT IN AERS
2.0000 | INHALATION_SPRAY | Freq: Four times a day (QID) | RESPIRATORY_TRACT | Status: DC | PRN
  Filled 2020-01-31: qty 6.7

## 2020-01-31 MED ORDER — LACTATED RINGERS IV SOLN: INTRAVENOUS | Status: DC

## 2020-01-31 MED ORDER — LEVOTHYROXINE SODIUM 50 MCG PO TABS
75.0000 ug | ORAL_TABLET | Freq: Every day | ORAL | Status: DC
  Administered 2020-01-31 – 2020-02-10 (×11): 75 ug via ORAL
  Filled 2020-01-31 (×10): qty 1
  Filled 2020-01-31: qty 2

## 2020-01-31 MED ORDER — HYDROCHLOROTHIAZIDE 25 MG PO TABS
25.0000 mg | ORAL_TABLET | Freq: Every day | ORAL | Status: DC
  Administered 2020-01-31 – 2020-02-01 (×2): 25 mg via ORAL
  Filled 2020-01-31 (×2): qty 1

## 2020-01-31 MED ORDER — SIMVASTATIN 20 MG PO TABS
40.0000 mg | ORAL_TABLET | Freq: Every day | ORAL | Status: DC
  Administered 2020-01-31 – 2020-02-01 (×2): 40 mg via ORAL
  Filled 2020-01-31 (×2): qty 2

## 2020-01-31 MED ORDER — PAROXETINE HCL 30 MG PO TABS
60.0000 mg | ORAL_TABLET | Freq: Every day | ORAL | Status: DC
  Administered 2020-01-31 – 2020-02-16 (×17): 60 mg via ORAL
  Filled 2020-01-31 (×18): qty 2

## 2020-01-31 MED ORDER — INSULIN ASPART 100 UNIT/ML ~~LOC~~ SOLN
0.0000 [IU] | Freq: Three times a day (TID) | SUBCUTANEOUS | Status: DC
  Administered 2020-01-31: 4 [IU] via SUBCUTANEOUS
  Administered 2020-01-31: 11 [IU] via SUBCUTANEOUS
  Administered 2020-02-01: 09:00:00 3 [IU] via SUBCUTANEOUS
  Administered 2020-02-01 (×2): 5 [IU] via SUBCUTANEOUS
  Administered 2020-02-02 (×2): 3 [IU] via SUBCUTANEOUS
  Administered 2020-02-03: 17:00:00 5 [IU] via SUBCUTANEOUS
  Administered 2020-02-03: 08:00:00 2 [IU] via SUBCUTANEOUS
  Administered 2020-02-03: 3 [IU] via SUBCUTANEOUS
  Administered 2020-02-04: 8 [IU] via SUBCUTANEOUS
  Administered 2020-02-04: 12:00:00 2 [IU] via SUBCUTANEOUS
  Administered 2020-02-05: 3 [IU] via SUBCUTANEOUS
  Administered 2020-02-05: 2 [IU] via SUBCUTANEOUS
  Administered 2020-02-06 – 2020-02-07 (×2): 3 [IU] via SUBCUTANEOUS
  Administered 2020-02-07: 18:00:00 5 [IU] via SUBCUTANEOUS
  Administered 2020-02-10: 12:00:00 3 [IU] via SUBCUTANEOUS
  Filled 2020-01-31 (×19): qty 1

## 2020-01-31 MED ORDER — SENNOSIDES-DOCUSATE SODIUM 8.6-50 MG PO TABS
1.0000 | ORAL_TABLET | Freq: Every evening | ORAL | Status: DC | PRN
  Administered 2020-02-07: 21:00:00 1 via ORAL
  Filled 2020-01-31: qty 1

## 2020-01-31 MED ORDER — METFORMIN HCL ER 500 MG PO TB24
1000.0000 mg | ORAL_TABLET | Freq: Two times a day (BID) | ORAL | Status: DC
  Administered 2020-01-31 – 2020-02-16 (×31): 1000 mg via ORAL
  Filled 2020-01-31 (×36): qty 2

## 2020-01-31 MED ORDER — ASPIRIN EC 81 MG PO TBEC
81.0000 mg | DELAYED_RELEASE_TABLET | Freq: Every day | ORAL | Status: DC
  Administered 2020-01-31 – 2020-02-10 (×11): 81 mg via ORAL
  Filled 2020-01-31 (×11): qty 1

## 2020-01-31 MED ORDER — INSULIN ASPART 100 UNIT/ML ~~LOC~~ SOLN
0.0000 [IU] | Freq: Every day | SUBCUTANEOUS | Status: DC
  Administered 2020-02-02 – 2020-02-09 (×2): 2 [IU] via SUBCUTANEOUS
  Filled 2020-01-31 (×2): qty 1

## 2020-01-31 MED ORDER — ALBUTEROL SULFATE HFA 108 (90 BASE) MCG/ACT IN AERS
2.0000 | INHALATION_SPRAY | Freq: Four times a day (QID) | RESPIRATORY_TRACT | Status: DC
  Administered 2020-01-31 – 2020-02-16 (×60): 2 via RESPIRATORY_TRACT
  Filled 2020-01-31: qty 6.7

## 2020-01-31 MED ORDER — HYDROCOD POLST-CPM POLST ER 10-8 MG/5ML PO SUER
5.0000 mL | Freq: Two times a day (BID) | ORAL | Status: DC | PRN
  Administered 2020-02-02: 5 mL via ORAL
  Filled 2020-01-31 (×2): qty 5

## 2020-01-31 NOTE — Evaluation (Signed)
Physical Therapy Evaluation Patient Details Name: Glenda Boone MRN: [id number] DOB: 1951/02/24 Today's Date: 01/31/2020   History of Present Illness  69 y.o. female presenting to hospital with shortness of breath, fatigue and admitted with Covid PNA.  She was her 1 month ago with AMS, sepsis.  Pt recently evicted from her home and had been living in a hotel for 2 months; moved into friend's home (with daughter as well) at that discharge.  PMH includes anxiety, COPD, DM, htn, neuropathy, ovarian CA s/p chemo, TAH, shingles, L ankle suergery, MDD.  Clinical Impression  Pt pleasant and willing to work with PT.  She reports that she has not been able to do any prolonged standing tasks since d/c ~1 month ago.  She was able to ambulate 20-25 ft with walker but had degraded posture and gait pattern and despite relatively stable vitals (O2 remained in 90s on 4L) she did need to stop secondary to some fatigue but mostly c/o pain in L>R LEs that she attributes to neuropathy (claudication-like).  Pt will need a walker at discharge, likely will benefit from HHPT per per progress while here.      Follow Up Recommendations Home health PT (per progress)    Equipment Recommendations  Rolling walker with 5" wheels    Recommendations for Other Services       Precautions / Restrictions Precautions Precautions: None Restrictions Weight Bearing Restrictions: No      Mobility  Bed Mobility Overal bed mobility: Modified Independent             General bed mobility comments: light use of rails, able to rise to sitting EOB with ease  Transfers Overall transfer level: Modified independent Equipment used: Rolling walker (2 wheeled)             General transfer comment: Pt with definite need of UEs, but no LOBs and good stability once standing in walker  Ambulation/Gait Ambulation/Gait assistance: Supervision Gait Distance (Feet): 25 Feet Assistive device: Rolling walker (2 wheeled)        General Gait Details: Pt with slow but safe ambulation.  Started to have forward lean that she corrected with cuing but ultimatley needed to stop secondary to b/l (L>R) leg pain.  Pt on 4L O2 with sats only slowly dropping from mid to low 90s, no excessive labored breathing, HR up to the 90s.  Stairs            Wheelchair Mobility    Modified Rankin (Stroke Patients Only)       Balance Overall balance assessment: Modified Independent                                           Pertinent Vitals/Pain Pain Assessment: No/denies pain (does have "neuropathy pain" in LEs with ambulation)    Home Living Family/patient expects to be discharged to:: Private residence Living Arrangements: Children (currently at friend's home but working on new place) Available Help at Discharge: Family;Available 24 hours/day Type of Home: House Home Access: Level entry (at current place and future home)       Home Equipment: Kasandra Knudsen - single point (reports her walker is missing/stolen with recent moves)      Prior Function Level of Independence: Independent with assistive device(s)         Comments: Pt uses SPC short distances in home but uses RW for longer distances (  up to 50 feet typically).  Pt reports last fall (legs buckled) was about 6 months ago.  H/o falls out of bed so pt now sleeps in middle of bed.     Hand Dominance        Extremity/Trunk Assessment   Upper Extremity Assessment Upper Extremity Assessment: Overall WFL for tasks assessed;Generalized weakness    Lower Extremity Assessment Lower Extremity Assessment: Overall WFL for tasks assessed;Generalized weakness       Communication   Communication: No difficulties  Cognition Arousal/Alertness: Awake/alert Behavior During Therapy: WFL for tasks assessed/performed Overall Cognitive Status: Within Functional Limits for tasks assessed                                        General  Comments General comments (skin integrity, edema, etc.): Pt reports that she has been quite limited with "prolonged" ambulation recently and that what she did today has been her normal since discharging last month    Exercises     Assessment/Plan    PT Assessment Patient needs continued PT services  PT Problem List Decreased strength;Decreased activity tolerance;Decreased balance;Decreased knowledge of use of DME;Decreased safety awareness;Cardiopulmonary status limiting activity;Pain       PT Treatment Interventions DME instruction;Gait training;Functional mobility training;Therapeutic activities;Therapeutic exercise;Balance training;Patient/family education    PT Goals (Current goals can be found in the Care Plan section)  Acute Rehab PT Goals Patient Stated Goal: get into her new home PT Goal Formulation: With patient Time For Goal Achievement: 02/14/20 Potential to Achieve Goals: Good    Frequency Min 2X/week   Barriers to discharge        Co-evaluation               AM-PAC PT "6 Clicks" Mobility  Outcome Measure Help needed turning from your back to your side while in a flat bed without using bedrails?: None Help needed moving from lying on your back to sitting on the side of a flat bed without using bedrails?: None Help needed moving to and from a bed to a chair (including a wheelchair)?: None Help needed standing up from a chair using your arms (e.g., wheelchair or bedside chair)?: None Help needed to walk in hospital room?: A Little Help needed climbing 3-5 steps with a railing? : A Lot 6 Click Score: 21    End of Session Equipment Utilized During Treatment: Oxygen (4L) Activity Tolerance: Patient limited by pain;Patient limited by fatigue Patient left: in chair;with call bell/phone within reach;with nursing/sitter in room Nurse Communication: Mobility status (vitals with activity) PT Visit Diagnosis: Muscle weakness (generalized) (M62.81);Difficulty in  walking, not elsewhere classified (R26.2)    Time: 9562-1308 PT Time Calculation (min) (ACUTE ONLY): 27 min   Charges:   PT Evaluation $PT Eval Low Complexity: 1 Low PT Treatments $Gait Training: 8-22 mins        Kreg Shropshire, DPT 01/31/2020, 4:00 PM

## 2020-01-31 NOTE — ED Notes (Signed)
Admitting rounding at bedside 

## 2020-01-31 NOTE — H&P (Signed)
History and Physical    SAMIKA VETSCH LOV:[id number] DOB: November 18, 1950 DOA: 01/30/2020  PCP: [facility], [facility]   Patient coming from: Home  Chief Complaint: Shortness of breath, cough  HPI: Glenda Boone is a 69 y.o. female with medical history significant for COPD, diabetes mellitus, hypertension, morbid obesity, depression/anxiety, hypothyroidism who presents by EMS for shortness of breath and cough.  Patient reports she was at [facility] last week for depression and tested positive for Covid on 01/23/2020.  She was released on Friday and felt worse over the weekend.  She has had progressively worsening shortness of breath that is exacerbated with even minimal exertion.  She has lost taste and smell sensation she reports.  Cough is nonproductive she states.  She reports she has not had any fevers or chills.   She does not use oxygen at home for COPD.  She states she has been using her breathing treatments at home but they are not helping her shortness of breath and cough. Patient is unvaccinated against COVID-19.  Reports she continues to smoke 1/2-1 pack of cigarettes per day  ED Course: In the emergency room Ms. Hadlock is found to be hypoxic with oxygen dropping with minimal exertion on room air.  She is requiring 2 is watched by nasal cannula to maintain O2 sat between 90 denies 8%.  Chest x-ray shows bilateral infiltrates consistent with Covid pneumonia.  ER is ordered inflammatory Covid labs at the time they called for admission and labs are still pending.  Review of Systems:  General: Reports generalized weakness.  Denies fever, chills, weight loss, night sweats.  Denies dizziness.  Reports decreased appetite HENT: Denies head trauma, headache, denies change in hearing, tinnitus.  Denies nasal congestion or bleeding.  Denies sore throat, sores in mouth.  Denies difficulty swallowing.  Has diminished taste and smell sensation Eyes: Denies blurry vision, pain in eye,  drainage.  Denies discoloration of eyes. Neck: Denies pain.  Denies swelling.  Denies pain with movement. Cardiovascular: Denies chest pain, palpitations.  Reports chronic edema of legs.  Denies orthopnea Respiratory: Reports shortness of breath with dry cough.  Reports intermittent wheezing.  Denies sputum production Gastrointestinal: Denies abdominal pain, swelling.  Denies nausea, vomiting, diarrhea.  Denies melena.  Denies hematemesis. Musculoskeletal: Denies limitation of movement.  Denies deformity or swelling.  Denies pain.  Denies arthralgias or myalgias. Genitourinary: Denies pelvic pain.  Denies urinary frequency or hesitancy.  Denies dysuria.  Skin: Denies rash.  Denies petechiae, purpura, ecchymosis. Neurological: Denies headache.  Denies syncope.  Denies seizure activity.  Denies paresthesia.  Denies slurred speech, drooping face.  Denies visual change. Psychiatric: Denies depression, anxiety.  Denies suicidal thoughts or ideation.  Denies hallucinations.  Past Medical History:  Diagnosis Date  . Anxiety   . Bronchitis   . COPD (chronic obstructive pulmonary disease) ([place])   . Depression   . Diabetes mellitus, type II ([place])   . Dyspnea   . GERD (gastroesophageal reflux disease)   . Hyperlipidemia   . Hypertension   . Hypothyroidism   . Neuropathy   . Ovarian cancer ([place])   . Shingles   . Thyroid disease     Past Surgical History:  Procedure Laterality Date  . ABDOMINAL HYSTERECTOMY    . ANKLE SURGERY Left   . CHOLECYSTECTOMY    . OVARY SURGERY    . SMALL INTESTINE SURGERY    . TONSILLECTOMY      Social History  reports that she has been  smoking cigarettes. She started smoking about 40 years ago. She has been smoking about 1.00 pack per day. She has never used smokeless tobacco. She reports that she does not drink alcohol and does not use drugs.  Allergies  Allergen Reactions  . Codeine   . Shellfish Allergy Hives  . Demerol [Meperidine Hcl] Anxiety  .  Gabapentin Other (See Comments)    Gives nightmares    Family History  Problem Relation Age of Onset  . Heart Problems Mother   . Hypertension Mother   . Colon cancer Father   . Arthritis-Osteo Sister      Prior to Admission medications   Medication Sig Start Date End Date Taking? Authorizing Provider  ADVAIR DISKUS 250-50 MCG/DOSE AEPB Inhale 1 puff into the lungs 2 (two) times daily. 12/31/19   [provider]  albuterol (VENTOLIN HFA) 108 (90 Base) MCG/ACT inhaler Inhale 2 puffs into the lungs every 6 (six) hours as needed for wheezing or shortness of breath. 01/01/20   Mercy Riding, MD  doxycycline (VIBRA-TABS) 100 MG tablet Take 1 tablet (100 mg total) by mouth every 12 (twelve) hours. 01/01/20   Mercy Riding, MD  hydrochlorothiazide (HYDRODIURIL) 25 MG tablet Take 25 mg by mouth daily.  06/30/17   [provider]  levothyroxine (SYNTHROID) 75 MCG tablet Take 75 mcg by mouth daily before breakfast. (take with 229mcg tablet to total 232mcg daily)    [provider]  levothyroxine (SYNTHROID, LEVOTHROID) 200 MCG tablet Take 200 mcg by mouth daily before breakfast. (take with 34mcg tablet to equal total of 263mcg daily)    [provider]  losartan (COZAAR) 100 MG tablet Take 100 mg by mouth daily. for high blood pressure 01/09/18   [provider]  metFORMIN (GLUCOPHAGE-XR) 500 MG 24 hr tablet TAKE 2 TABLETS BY MOUTH TWICE DAILY FOR DIABETES    [provider]  mometasone-formoterol (DULERA) 200-5 MCG/ACT AERO Inhale 2 puffs into the lungs 2 (two) times daily. 01/01/20   Mercy Riding, MD  PARoxetine (PAXIL) 20 MG tablet Take 3 tablets (60 mg total) by mouth daily. 04/25/19   [name], MD  simvastatin (ZOCOR) 40 MG tablet Take 40 mg by mouth daily.    [provider]    Physical Exam: Vitals:   01/30/20 1658 01/30/20 2124 01/30/20 2306 01/30/20 2338  BP:  (!) 139/99 121/79   Pulse:  67 75 84  Resp:  20 20 (!) 35  Temp:       TempSrc:      SpO2: 96% 94% 94% 91%  Weight:      Height:        Constitutional: NAD, calm, comfortable Vitals:   01/30/20 1658 01/30/20 2124 01/30/20 2306 01/30/20 2338  BP:  (!) 139/99 121/79   Pulse:  67 75 84  Resp:  20 20 (!) 35  Temp:      TempSrc:      SpO2: 96% 94% 94% 91%  Weight:      Height:       General: WDWN, Alert and oriented x3.  Eyes: EOMI, PERRL, lids and conjunctivae normal.  Sclera nonicteric HENT:  [place]/AT, external ears normal.  Nares patent without epistasis.  Mucous membranes are moist. Posterior pharynx clear of any exudate or lesions. Neck: Soft, normal range of motion, supple, no masses, Trachea midline Respiratory: Equal but diminished breath sounds bilaterally.  Diffuse bilateral rales and mild expiratory wheezing, no crackles. Normal respiratory effort. No accessory muscle use.  Cardiovascular: Regular rate and rhythm, no murmurs / rubs / gallops.  Mild lower extremity edema. 1+ pedal pulses.  Abdomen: Soft, no tenderness, nondistended, morbidly obese.  No rebound or guarding.  No masses palpated. Bowel sounds normoactive Musculoskeletal: FROM.  Has clubbing of digits.  No cyanosis. No joint deformity upper and lower extremities.  Normal muscle tone.  Skin: Warm, dry, intact no rashes, lesions, ulcers. No induration Neurologic: CN 2-12 grossly intact.  Normal speech.  Sensation intact,  Strength 4/5 in all extremities.   Psychiatric: Normal judgment and insight.  Normal mood.    Labs on Admission: I have personally reviewed following labs and imaging studies  CBC: Recent Labs  Lab 01/30/20 1702  WBC 8.3  HGB 11.9*  HCT 33.5*  MCV 97.7  PLT 160    Basic Metabolic Panel: Recent Labs  Lab 01/30/20 1702  NA 140  K 3.6  CL 103  CO2 24  GLUCOSE 126*  BUN 29*  CREATININE 0.98  CALCIUM 8.3*    GFR: Estimated Creatinine Clearance: 66.7 mL/min (by C-G formula based on SCr of 0.98 mg/dL).  Liver Function Tests: No results for  input(s): AST, ALT, ALKPHOS, BILITOT, PROT, ALBUMIN in the last 168 hours.  Urine analysis:    Component Value Date/Time   COLORURINE YELLOW (A) 12/30/2019 1257   APPEARANCEUR HAZY (A) 12/30/2019 1257   LABSPEC 1.018 12/30/2019 1257   PHURINE 5.0 12/30/2019 1257   GLUCOSEU NEGATIVE 12/30/2019 1257   HGBUR NEGATIVE 12/30/2019 1257   [place] 12/30/2019 1257   KETONESUR NEGATIVE 12/30/2019 1257   PROTEINUR NEGATIVE 12/30/2019 1257   NITRITE NEGATIVE 12/30/2019 1257   LEUKOCYTESUR NEGATIVE 12/30/2019 1257    Radiological Exams on Admission: DG Chest 2 View  Result Date: 01/30/2020 CLINICAL DATA:  Shortness of breath.  COVID positive. EXAM: CHEST - 2 VIEW COMPARISON:  Radiograph 12/30/2019 FINDINGS: Chronic elevation of right hemidiaphragm. Upper normal heart size with unchanged mediastinal contours. Aortic atherosclerosis. Patchy heterogeneous bilateral airspace opacities in a mid-lower lung zone predominant distribution, left greater than right. No pneumothorax or large pleural effusion. Scoliotic curvature of spine. No acute osseous abnormalities are seen. IMPRESSION: Patchy heterogeneous bilateral airspace opacities in a mid-lower lung zone predominant distribution, left greater than right, consistent with COVID-19 pneumonia. Electronically Signed   By: Keith Rake M.D.   On: 01/30/2020 17:57    EKG: Independently reviewed.  EKG is reviewed and shows normal sinus rhythm with PACs.  No acute ST elevation or depression.  QTc prolonged at 483  Assessment/Plan Principal Problem:   Pneumonia due to COVID-19 virus Ms. Mascaro is admitted to medical/surgical floor on COVID precautions.  Supplemental oxygen provided as needed to keep O2 sat between 92 to 96%. Patient started on remdesivir and Solu-Medrol per Covid protocol. Incentive spirometer for 1 to 2 hours while awake.  Albuterol MDI as needed for cough, shortness of breath, wheezing. Dulera MDI twice a day. IVF  hydration provided overnight.   Active Problems:   COPD (chronic obstructive pulmonary disease) ([place]) Patient continued on home medications for COPD.  Dulera is substituted for Advair per formulary.  Continue albuterol MDI as needed    Diabetes mellitus type 2 Patient is on Metformin at home which be continued.  Monitor blood sugars.  Signs concerning for glycemic control.  Check hemoglobin A1c    Essential hypertension Home medications of losartan and HCTZ.  Monitor blood pressure    Hypoxia Supplemental oxygen as above keep O2 sat between 92 to 96%. Pulmonary  toilet    Depression, major, recurrent, moderate (HCC) Continue home dose of Paxil    Obesity, Class III, BMI 40-49.9 (morbid obesity) ([place]) Follow-up with PCP for dietary and lifestyle interventions for weight loss     DVT prophylaxis: [place] score elevated.  Lovenox for DVT prophylaxis Code Status:   DNR.  CODE STATUS discussed with patient and she verifies presents as DNR Family Communication:  Diagnosis plan discussed with patient.  Patient verbalized understanding agrees with plan.  Further agrees to follow as clinical indicated Disposition Plan:   Patient is from:  Home  Anticipated DC to:  Home  Anticipated DC date:  Anticipate greater than 2 midnights in the hospital to treat acute medical condition  Anticipated DC barriers: No barriers to discharge identified at this time   Admission status:  Inpatient  Severity of Illness: The appropriate patient status for this patient is INPATIENT. Inpatient status is judged to be reasonable and necessary in order to provide the required intensity of service to ensure the patient's safety. The patient's presenting symptoms, physical exam findings, and initial radiographic and laboratory data in the context of their chronic comorbidities is felt to place them at high risk for further clinical deterioration. Furthermore, it is not anticipated that the patient will be medically  stable for discharge from the hospital within 2 midnights of admission. The following factors support the patient status of inpatient.   * I certify that at the point of admission it is my clinical judgment that the patient will require inpatient hospital care spanning beyond 2 midnights from the point of admission due to high intensity of service, high risk for further deterioration and high frequency of surveillance required.Yevonne Aline Treina Arscott MD [facility]  How to contact the [facility] Attending or Consulting provider [place] or covering provider during after hours [place], for this patient?   1. Check the care team in [facility] and look for a) attending/consulting TR[facility] provider listed and b) the [facility] team listed 2. Log into [email or web address] and use [place]'s universal password to access. If you do not have the password, please contact the hospital operator. 3. Locate the [facility] provider you are looking for under [facility] and page to a number that you can be directly reached. 4. If you still have difficulty reaching the provider, please page the [facility] (Director on Call) for the Hospitalists listed on amion for assistance.  01/31/2020, 12:50 AM

## 2020-01-31 NOTE — Progress Notes (Signed)
Patient ID: Glenda Boone, female   DOB: 1950/06/12, 69 y.o.   MRN: [id number] [facility] PROGRESS NOTE  Glenda Boone NWG:[id number] DOB: March 21, 1951 DOA: 01/30/2020 PCP: [facility], [place]  HPI/Subjective: Patient admitted with COVID-19 pneumonia coming in with shortness of breath and cough.  Feels a little bit better once oxygen was placed and treatment started.  Still having some cough and shortness of breath.  Objective: Vitals:   01/31/20 1130 01/31/20 1529  BP: (!) 172/86 140/65  Pulse: 68 68  Resp: 18 18  Temp: 98 F (36.7 C) 97.9 F (36.6 C)  SpO2: 93% 95%    Intake/Output Summary (Last 24 hours) at 01/31/2020 1652 Last data filed at 01/31/2020 0131 Gross per 24 hour  Intake 250 ml  Output --  Net 250 ml   Filed Weights   01/30/20 1648  Weight: 122.5 kg    ROS: Review of Systems  Respiratory: Positive for cough, shortness of breath and wheezing.   Cardiovascular: Negative for chest pain.  Gastrointestinal: Negative for abdominal pain, nausea and vomiting.   Exam: Physical Exam HENT:     Nose: No mucosal edema.     Mouth/Throat:     Pharynx: No oropharyngeal exudate.  Eyes:     General: Lids are normal.     Conjunctiva/sclera: Conjunctivae normal.  Cardiovascular:     Rate and Rhythm: Normal rate and regular rhythm.     Heart sounds: Normal heart sounds, S1 normal and S2 normal.  Pulmonary:     Breath sounds: Examination of the right-middle field reveals decreased breath sounds and wheezing. Examination of the left-middle field reveals decreased breath sounds and wheezing. Examination of the right-lower field reveals decreased breath sounds and rhonchi. Examination of the left-lower field reveals decreased breath sounds and rhonchi. Decreased breath sounds, wheezing and rhonchi present. No rales.  Abdominal:     Palpations: Abdomen is soft.     Tenderness: There is no abdominal tenderness.  Musculoskeletal:     Right lower leg: No  swelling.     Right ankle: Swelling present.     Left ankle: Swelling present.  Skin:    General: Skin is warm.     Findings: No rash.  Neurological:     Mental Status: She is alert and oriented to person, place, and time.       Data Reviewed: Basic Metabolic Panel: Recent Labs  Lab 01/30/20 1702 01/31/20 0508  NA 140  --   K 3.6  --   CL 103  --   CO2 24  --   GLUCOSE 126*  --   BUN 29*  --   CREATININE 0.98 1.21*  CALCIUM 8.3*  --    CBC: Recent Labs  Lab 01/30/20 1702 01/31/20 0508  WBC 8.3 7.8  HGB 11.9* 11.2*  HCT 33.5* 31.8*  MCV 97.7 96.4  PLT 312 268   BNP (last 3 results) Recent Labs    12/31/19 0450 01/31/20 0154  BNP 43.9 52.0    CBG: Recent Labs  Lab 01/31/20 1139 01/31/20 1424 01/31/20 1626  GLUCAP 274* 330* 214*    Recent Results (from the past 240 hour(s))  SARS Coronavirus 2 by RT PCR (hospital order, performed in [facility] hospital lab) Nasopharyngeal Nasopharyngeal Swab     Status: Abnormal   Collection Time: 01/31/20 12:25 AM   Specimen: Nasopharyngeal Swab  Result Value Ref Range Status   SARS Coronavirus 2 POSITIVE (A) NEGATIVE Final    Comment: RESULT CALLED  TO, READ BACK BY AND VERIFIED WITH:  ANNA JASPER AT 0803 01/31/20 SDR (NOTE) SARS-CoV-2 target nucleic acids are DETECTED  SARS-CoV-2 RNA is generally detectable in upper respiratory specimens  during the acute phase of infection.  Positive results are indicative  of the presence of the identified virus, but do not rule out bacterial infection or co-infection with other pathogens not detected by the test.  Clinical correlation with patient history and  other diagnostic information is necessary to determine patient infection status.  The expected result is negative.  Fact Sheet for Patients:   StrictlyIdeas.no   Fact Sheet for Healthcare Providers:   BankingDealers.co.za    This test is not yet approved or cleared by  the [place] FDA and  has been authorized for detection and/or diagnosis of SARS-CoV-2 by FDA under an Emergency Use Authorization (EUA).  This EUA will remain in effect (meaning this test  can be used) for the duration of  the COVID-19 declaration under Section 564(b)(1) of the Act, 21 U.S.C. section 360-bbb-3(b)(1), unless the authorization is terminated or revoked sooner.  Performed at [facility], [address] [place], [place] [address]      Studies: DG Chest 2 View  Result Date: 01/30/2020 CLINICAL DATA:  Shortness of breath.  COVID positive. EXAM: CHEST - 2 VIEW COMPARISON:  Radiograph 12/30/2019 FINDINGS: Chronic elevation of right hemidiaphragm. Upper normal heart size with unchanged mediastinal contours. Aortic atherosclerosis. Patchy heterogeneous bilateral airspace opacities in a mid-lower lung zone predominant distribution, left greater than right. No pneumothorax or large pleural effusion. Scoliotic curvature of spine. No acute osseous abnormalities are seen. IMPRESSION: Patchy heterogeneous bilateral airspace opacities in a mid-lower lung zone predominant distribution, left greater than right, consistent with COVID-19 pneumonia. Electronically Signed   By: Keith Rake M.D.   On: 01/30/2020 17:57    Scheduled Meds: . albuterol  2 puff Inhalation QID  . aspirin EC  81 mg Oral Daily  . enoxaparin (LOVENOX) injection  40 mg Subcutaneous BID  . hydrochlorothiazide  25 mg Oral Daily  . insulin aspart  0-15 Units Subcutaneous TID WC  . insulin aspart  0-5 Units Subcutaneous QHS  . ipratropium-albuterol  3 mL Nebulization Once  . levothyroxine  200 mcg Oral QAC breakfast  . levothyroxine  75 mcg Oral QAC breakfast  . losartan  100 mg Oral Daily  . metFORMIN  1,000 mg Oral BID WC  . methylPREDNISolone (SOLU-MEDROL) injection  1 mg/kg Intravenous Q12H   Followed by  . [START ON 02/03/2020] predniSONE  50 mg Oral Daily  . mometasone-formoterol  2 puff  Inhalation BID  . nicotine  14 mg Transdermal Daily  . PARoxetine  60 mg Oral Daily  . simvastatin  40 mg Oral Daily   Continuous Infusions: . lactated ringers 100 mL/hr at 01/31/20 0509  . remdesivir 100 mg in NS 100 mL      Assessment/Plan:  1. COVID-19 pneumonia.  Likely received first dose of remdesivir last night and probably second dose today.  Continue Solu-Medrol and albuterol inhaler. 2. Acute hypoxic respiratory failure secondary to COVID-19 pneumonia on 2 L of oxygen.  Pulse ox was 88% on room air. Oxygen support. 3. Morbid obesity.  Weight loss needed. 4. Type 2 diabetes mellitus with hyperlipidemia.  On Metformin and sliding scale.  Continue simvastatin. 5. Essential hypertension on losartan and hydrochlorothiazide 6. Depression on Paxil 7. Tobacco abuse on nicotine patch 8. Hypothyroidism unspecified on levothyroxine     Code Status:  Code Status Orders  (From admission, onward)         Start     Ordered   01/31/20 0445  Do not attempt resuscitation (DNR)  Continuous       Question Answer Comment  In the event of cardiac or respiratory ARREST Do not call a "code blue"   In the event of cardiac or respiratory ARREST Do not perform Intubation, CPR, defibrillation or ACLS   In the event of cardiac or respiratory ARREST Use medication by any route, position, wound care, and other measures to relive pain and suffering. May use oxygen, suction and manual treatment of airway obstruction as needed for comfort.      01/31/20 0445        Code Status History    Date Active Date Inactive Code Status Order ID Comments User Context   12/30/2019 1635 01/01/2020 1904 DNR [id number]  Mercy Riding, MD ED   09/04/2019 1603 09/05/2019 2220 DNR [id number]  Sidney Ace, MD ED   03/09/2019 1804 03/10/2019 1839 DNR [id number]  Demetrios Loll, MD ED   03/09/2019 1734 03/09/2019 1804 Full Code [id number]  Demetrios Loll, MD ED   Advance Care Planning Activity     Family  Communication: Called daughter but mailbox was full Disposition Plan: Status is: Inpatient  Dispo: The patient is from: Home              Anticipated d/c is to: Home with home health              Anticipated d/c date is: Will likely need a few days here in the hospital              Patient currently being treated for COVID-19 pneumonia  Time spent: 34 minutes  [place]

## 2020-01-31 NOTE — ED Notes (Signed)
Pt de-sat to 87-88% on RA while sleeping, pt placed on 2L [place] with improvement to 93%. Denies needs or concerns. Phlebotomy at bedside.

## 2020-02-01 LAB — CBC WITH DIFFERENTIAL/PLATELET
Abs Immature Granulocytes: 0.09 10*3/uL — ABNORMAL HIGH (ref 0.00–0.07)
Basophils Absolute: 0 10*3/uL (ref 0.0–0.1)
Basophils Relative: 0 %
Eosinophils Absolute: 0 10*3/uL (ref 0.0–0.5)
Eosinophils Relative: 0 %
HCT: 29.2 % — ABNORMAL LOW (ref 36.0–46.0)
Hemoglobin: 10.1 g/dL — ABNORMAL LOW (ref 12.0–15.0)
Immature Granulocytes: 1 %
Lymphocytes Relative: 9 %
Lymphs Abs: 0.7 10*3/uL (ref 0.7–4.0)
MCH: 33.3 pg (ref 26.0–34.0)
MCHC: 34.6 g/dL (ref 30.0–36.0)
MCV: 96.4 fL (ref 80.0–100.0)
Monocytes Absolute: 0.3 10*3/uL (ref 0.1–1.0)
Monocytes Relative: 4 %
Neutro Abs: 7.1 10*3/uL (ref 1.7–7.7)
Neutrophils Relative %: 86 %
Platelets: 295 10*3/uL (ref 150–400)
RBC: 3.03 MIL/uL — ABNORMAL LOW (ref 3.87–5.11)
RDW: 13.2 % (ref 11.5–15.5)
Smear Review: NORMAL
WBC: 8.3 10*3/uL (ref 4.0–10.5)
nRBC: 0 % (ref 0.0–0.2)

## 2020-02-01 LAB — GLUCOSE, CAPILLARY
Glucose-Capillary: 160 mg/dL — ABNORMAL HIGH (ref 70–99)
Glucose-Capillary: 201 mg/dL — ABNORMAL HIGH (ref 70–99)
Glucose-Capillary: 153 mg/dL — ABNORMAL HIGH (ref 70–99)
Glucose-Capillary: 170 mg/dL — ABNORMAL HIGH (ref 70–99)

## 2020-02-01 LAB — COMPREHENSIVE METABOLIC PANEL
ALT: 30 U/L (ref 0–44)
AST: 20 U/L (ref 15–41)
Albumin: 3 g/dL — ABNORMAL LOW (ref 3.5–5.0)
Alkaline Phosphatase: 56 U/L (ref 38–126)
Anion gap: 12 (ref 5–15)
BUN: 54 mg/dL — ABNORMAL HIGH (ref 8–23)
CO2: 24 mmol/L (ref 22–32)
Calcium: 8.3 mg/dL — ABNORMAL LOW (ref 8.9–10.3)
Chloride: 101 mmol/L (ref 98–111)
Creatinine, Ser: 1.24 mg/dL — ABNORMAL HIGH (ref 0.44–1.00)
GFR calc Af Amer: 51 mL/min — ABNORMAL LOW (ref >60)
GFR calc non Af Amer: 44 mL/min — ABNORMAL LOW (ref >60)
Glucose, Bld: 180 mg/dL — ABNORMAL HIGH (ref 70–99)
Potassium: 4.1 mmol/L (ref 3.5–5.1)
Sodium: 137 mmol/L (ref 135–145)
Total Bilirubin: 0.9 mg/dL (ref 0.3–1.2)
Total Protein: 6.4 g/dL — ABNORMAL LOW (ref 6.5–8.1)

## 2020-02-01 LAB — C-REACTIVE PROTEIN: CRP: 7.7 mg/dL — ABNORMAL HIGH (ref <1.0)

## 2020-02-01 LAB — FERRITIN: Ferritin: 678 ng/mL — ABNORMAL HIGH (ref 11–307)

## 2020-02-01 MED ORDER — LACTATED RINGERS IV SOLN: INTRAVENOUS | Status: AC

## 2020-02-01 MED ORDER — AMLODIPINE BESYLATE 5 MG PO TABS
5.0000 mg | ORAL_TABLET | Freq: Every day | ORAL | Status: DC
  Administered 2020-02-01: 12:00:00 5 mg via ORAL
  Filled 2020-02-01: qty 1

## 2020-02-01 MED ORDER — ATORVASTATIN CALCIUM 20 MG PO TABS
20.0000 mg | ORAL_TABLET | Freq: Every day | ORAL | Status: DC
  Administered 2020-02-02 – 2020-02-16 (×15): 20 mg via ORAL
  Filled 2020-02-01 (×15): qty 1

## 2020-02-01 NOTE — TOC Progression Note (Signed)
Transition of Care [facility]) - Progression Note    Patient Details  Name: Glenda Boone MRN: [id number] Date of Birth: March 01, 1951  Transition of Care [facility]) CM/SW Contact  Shelbie Hutching, RN Phone Number: 02/01/2020, 3:33 PM  Clinical Narrative:    This RNCM has attempted to reach the patient's daughter via phone, both times straight to voicemail, left messages both times.         Expected Discharge Plan and Services                                                 Social Determinants of Health (SDOH) Interventions    Readmission Risk Interventions No flowsheet data found.

## 2020-02-01 NOTE — Progress Notes (Signed)
PROGRESS NOTE    Glenda Boone  FTD:[id number] DOB: 04/05/1951 DOA: 01/30/2020 PCP: [facility] [place]   Brief Narrative: Taken from [place] is a 69 y.o. female with medical history significant for COPD, diabetes mellitus, hypertension, morbid obesity, depression/anxiety, hypothyroidism who presents by EMS for shortness of breath and cough.  Tested positive with Covid on 01/23/20. Patient is unvaccinated. Admitted with acute respiratory failure secondary to COVID-19 pneumonia.  Subjective: No new complaints. Still coughing and appears dry.  Assessment & Plan:   Principal Problem:   Pneumonia due to COVID-19 virus Active Problems:   Depression, major, recurrent, moderate (HCC)   COPD (chronic obstructive pulmonary disease) (HCC)   Diabetic ketoacidosis with coma associated with type 2 diabetes mellitus (HCC)   Essential hypertension   Hypoxia   Obesity, Class III, BMI 40-49.9 (morbid obesity) ([place])   Acute respiratory failure with hypoxia (HCC)   Type 2 diabetes mellitus with hyperlipidemia (HCC)   Hypothyroidism  Acute hypoxic respiratory failure secondary to COVID-19 pneumonia. Patient was saturating in low 90s on 3 L when seen today. Feeling little better as compared to before. Elevated inflammatory markers which now trending down. -Continue remdesivir-day 3 -Continue Solu-Medrol. -Continue with supportive care and supplements. -Continue with supplemental oxygen-wean as tolerated. -Continue to monitor inflammatory markers.  Essential hypertension. Blood pressure elevated. Discontinue home dose of HCTZ as patient appears dry and developed AKI. -Start her on amlodipine. -Continue losartan-we will hold losartan if kidney function continued to get worse.  Hypothyroidism. -Continue home dose of Synthroid.  Type 2 diabetes mellitus. CBG elevated. Patient is on steroid. -Continue with sliding scale and mealtime coverage.  AKI. Creatinine  bumped to 1.24 today. Patient appears dry. Baseline below 1. -Discontinue HCTZ. -Gentle IV hydration. -Continue to monitor. -Avoid nephrotoxins.  History of depression. No acute concern. -Continue home dose of Paxil.  Morbid obesity. Body mass index is 49.38 kg/m.  This will complicate overall prognosis.  Objective: Vitals:   01/31/20 1740 01/31/20 2014 01/31/20 2333 02/01/20 0824  BP:  (!) 152/91 (!) 152/79 (!) 176/108  Pulse:  83 62 61  Resp:  (!) 22 (!) 22 20  Temp:  98.6 F (37 C) 98.5 F (36.9 C) 97.7 F (36.5 C)  TempSrc:   Oral Oral  SpO2: 91% 92% 93% 93%  Weight:      Height:        Intake/Output Summary (Last 24 hours) at 02/01/2020 0916 Last data filed at 01/31/2020 1800 Gross per 24 hour  Intake 720 ml  Output --  Net 720 ml   Filed Weights   01/30/20 1648  Weight: 122.5 kg    Examination:  General exam: Appears calm and comfortable  Respiratory system: Clear to auscultation. Respiratory effort normal. Cardiovascular system: S1 & S2 heard, RRR. No JVD, murmurs, rubs, gallops or clicks. Gastrointestinal system: Soft, nontender, nondistended, bowel sounds positive. Central nervous system: Alert and oriented. No focal neurological deficits. Extremities: No edema, no cyanosis, pulses intact and symmetrical. Psychiatry: Judgement and insight appear normal. Mood & affect appropriate.    DVT prophylaxis: Lovenox Code Status: DNR Family Communication: Call daughter with no response. Disposition Plan:  Status is: Inpatient  Remains inpatient appropriate because:Inpatient level of care appropriate due to severity of illness   Dispo: The patient is from: Home              Anticipated d/c is to: Home  Anticipated d/c date is: 2 days              Patient currently is not medically stable to d/c.  Consultants:   None  Procedures:  Antimicrobials:   Data Reviewed: I have personally reviewed following labs and imaging  studies  CBC: Recent Labs  Lab 01/30/20 1702 01/31/20 0508 02/01/20 0442  WBC 8.3 7.8 8.3  NEUTROABS  --   --  7.1  HGB 11.9* 11.2* 10.1*  HCT 33.5* 31.8* 29.2*  MCV 97.7 96.4 96.4  PLT 312 268 559   Basic Metabolic Panel: Recent Labs  Lab 01/30/20 1702 01/31/20 0508 02/01/20 0442  NA 140  --  137  K 3.6  --  4.1  CL 103  --  101  CO2 24  --  24  GLUCOSE 126*  --  180*  BUN 29*  --  54*  CREATININE 0.98 1.21* 1.24*  CALCIUM 8.3*  --  8.3*   GFR: Estimated Creatinine Clearance: 52.7 mL/min (A) (by C-G formula based on SCr of 1.24 mg/dL (H)). Liver Function Tests: Recent Labs  Lab 02/01/20 0442  AST 20  ALT 30  ALKPHOS 56  BILITOT 0.9  PROT 6.4*  ALBUMIN 3.0*   No results for input(s): LIPASE, AMYLASE in the last 168 hours. No results for input(s): AMMONIA in the last 168 hours. Coagulation Profile: No results for input(s): INR, PROTIME in the last 168 hours. Cardiac Enzymes: No results for input(s): CKTOTAL, CKMB, CKMBINDEX, TROPONINI in the last 168 hours. BNP (last 3 results) No results for input(s): PROBNP in the last 8760 hours. HbA1C: No results for input(s): HGBA1C in the last 72 hours. CBG: Recent Labs  Lab 01/31/20 1139 01/31/20 1424 01/31/20 1626 01/31/20 2049 02/01/20 0802  GLUCAP 274* 330* 214* 190* 160*   Lipid Profile: No results for input(s): CHOL, HDL, LDLCALC, TRIG, CHOLHDL, LDLDIRECT in the last 72 hours. Thyroid Function Tests: No results for input(s): TSH, T4TOTAL, FREET4, T3FREE, THYROIDAB in the last 72 hours. Anemia Panel: Recent Labs    01/31/20 0154 02/01/20 0442  FERRITIN 842* 678*   Sepsis Labs: Recent Labs  Lab 01/31/20 0154  PROCALCITON 0.15    Recent Results (from the past 240 hour(s))  SARS Coronavirus 2 by RT PCR (hospital order, performed in [facility] hospital lab) Nasopharyngeal Nasopharyngeal Swab     Status: Abnormal   Collection Time: 01/31/20 12:25 AM   Specimen: Nasopharyngeal Swab  Result  Value Ref Range Status   SARS Coronavirus 2 POSITIVE (A) NEGATIVE Final    Comment: RESULT CALLED TO, READ BACK BY AND VERIFIED WITH:  ANNA JASPER AT 0803 01/31/20 SDR (NOTE) SARS-CoV-2 target nucleic acids are DETECTED  SARS-CoV-2 RNA is generally detectable in upper respiratory specimens  during the acute phase of infection.  Positive results are indicative  of the presence of the identified virus, but do not rule out bacterial infection or co-infection with other pathogens not detected by the test.  Clinical correlation with patient history and  other diagnostic information is necessary to determine patient infection status.  The expected result is negative.  Fact Sheet for Patients:   StrictlyIdeas.no   Fact Sheet for Healthcare Providers:   BankingDealers.co.za    This test is not yet approved or cleared by the [place] FDA and  has been authorized for detection and/or diagnosis of SARS-CoV-2 by FDA under an Emergency Use Authorization (EUA).  This EUA will remain in effect (meaning this test  can be used)  for the duration of  the COVID-19 declaration under Section 564(b)(1) of the Act, 21 U.S.C. section 360-bbb-3(b)(1), unless the authorization is terminated or revoked sooner.  Performed at [facility], [address], [place], [place] [address]      Radiology Studies: DG Chest 2 View  Result Date: 01/30/2020 CLINICAL DATA:  Shortness of breath.  COVID positive. EXAM: CHEST - 2 VIEW COMPARISON:  Radiograph 12/30/2019 FINDINGS: Chronic elevation of right hemidiaphragm. Upper normal heart size with unchanged mediastinal contours. Aortic atherosclerosis. Patchy heterogeneous bilateral airspace opacities in a mid-lower lung zone predominant distribution, left greater than right. No pneumothorax or large pleural effusion. Scoliotic curvature of spine. No acute osseous abnormalities are seen. IMPRESSION: Patchy  heterogeneous bilateral airspace opacities in a mid-lower lung zone predominant distribution, left greater than right, consistent with COVID-19 pneumonia. Electronically Signed   By: Keith Rake M.D.   On: 01/30/2020 17:57    Scheduled Meds: . albuterol  2 puff Inhalation QID  . amLODipine  5 mg Oral Daily  . aspirin EC  81 mg Oral Daily  . enoxaparin (LOVENOX) injection  40 mg Subcutaneous BID  . insulin aspart  0-15 Units Subcutaneous TID WC  . insulin aspart  0-5 Units Subcutaneous QHS  . ipratropium-albuterol  3 mL Nebulization Once  . levothyroxine  200 mcg Oral QAC breakfast  . levothyroxine  75 mcg Oral QAC breakfast  . losartan  100 mg Oral Daily  . metFORMIN  1,000 mg Oral BID WC  . methylPREDNISolone (SOLU-MEDROL) injection  1 mg/kg Intravenous Q12H   Followed by  . [START ON 02/03/2020] predniSONE  50 mg Oral Daily  . mometasone-formoterol  2 puff Inhalation BID  . nicotine  14 mg Transdermal Daily  . PARoxetine  60 mg Oral Daily  . simvastatin  40 mg Oral Daily   Continuous Infusions: . lactated ringers 100 mL/hr at 01/31/20 0509  . lactated ringers    . remdesivir 100 mg in NS 100 mL 100 mg (01/31/20 1330)     LOS: 1 day   Time spent: 35 minutes.  Lorella Nimrod, MD [facility]  If 7PM-7AM, please contact night-coverage [email or web address]  02/01/2020, 9:16 AM   This record has been created using Systems analyst. Errors have been sought and corrected,but may not always be located. Such creation errors do not reflect on the standard of care.

## 2020-02-01 NOTE — Progress Notes (Signed)
Pt AAox4, VS stable Oxygen weaned down from 3L [place] to 2L [place] spo2 94%, No complaints today. Up ad lib. IV fluids infusing, received IV remdesivir. Pt with good appetite. CBG monitoring and supplemental insulin given. Will continue to monitor.

## 2020-02-01 NOTE — Progress Notes (Signed)
PHARMACIST - PHYSICIAN ORDER COMMUNICATION   CONCERNING: Simvastatin 40 mg daily and amlodipine - rhabdomyolysis risk      DESCRIPTION:   Patients on amlodipine and simvastatin >20 mg/day have reported cases of rhabdomyolysis. Pharmacy is to assess simvastatin dose. If >20 mg, substitute atorvastatin (Lipitor) 1mg for each 2mg simvastatin.     This patient is ordered simvastatin 40mg and amlodipine 5mg.                                                               ACTION TAKEN: Per protocol pharmacy has discontinued the patient's order for simvastatin and replaced it with Atorvastatin 20 mg.   

## 2020-02-01 NOTE — Progress Notes (Signed)
Inpatient Diabetes Program Recommendations  AACE/ADA: New Consensus Statement on Inpatient Glycemic Control   Target Ranges:  Prepandial:   less than 140 mg/dL      Peak postprandial:   less than 180 mg/dL (1-2 hours)      Critically ill patients:  140 - 180 mg/dL   Results for Glenda Boone, Glenda Boone (MRN [id number]) as of 02/01/2020 12:30  Ref. Range 01/31/2020 11:39 01/31/2020 14:24 01/31/2020 16:26 01/31/2020 20:49 02/01/2020 08:02 02/01/2020 11:34  Glucose-Capillary Latest Ref Range: 70 - 99 mg/dL 274 (H) 330 (H) 214 (H) 190 (H) 160 (H) 201 (H)   Review of Glycemic Control  Diabetes history: DM2 Outpatient Diabetes medications: Metformin 1000 mg BID Current orders for Inpatient glycemic control: Novolog 0-15 units TID with meals, Novolog 0-5 units QHS, Metformin 1000 mg BID; Prednisone 50 mg daily, Solumedrol 122.5 mg Q12H  Inpatient Diabetes Program Recommendations:    Insulin: If steroids are continued as ordered, please consider ordering Novolog 3 units TID with meals for meal coverage if patient eats at least 50% of meals.  Thanks, Barnie Alderman, RN, MSN, CDE Diabetes Coordinator Inpatient Diabetes Program [phone] (Team Pager from 8am to 5pm)

## 2020-02-02 LAB — CBC WITH DIFFERENTIAL/PLATELET
Abs Immature Granulocytes: 0.11 10*3/uL — ABNORMAL HIGH (ref 0.00–0.07)
Basophils Absolute: 0 10*3/uL (ref 0.0–0.1)
Basophils Relative: 0 %
Eosinophils Absolute: 0 10*3/uL (ref 0.0–0.5)
Eosinophils Relative: 0 %
HCT: 31.3 % — ABNORMAL LOW (ref 36.0–46.0)
Hemoglobin: 10.6 g/dL — ABNORMAL LOW (ref 12.0–15.0)
Immature Granulocytes: 1 %
Lymphocytes Relative: 8 %
Lymphs Abs: 0.7 10*3/uL (ref 0.7–4.0)
MCH: 33.1 pg (ref 26.0–34.0)
MCHC: 33.9 g/dL (ref 30.0–36.0)
MCV: 97.8 fL (ref 80.0–100.0)
Monocytes Absolute: 0.4 10*3/uL (ref 0.1–1.0)
Monocytes Relative: 4 %
Neutro Abs: 7.2 10*3/uL (ref 1.7–7.7)
Neutrophils Relative %: 87 %
Platelets: 331 10*3/uL (ref 150–400)
RBC: 3.2 MIL/uL — ABNORMAL LOW (ref 3.87–5.11)
RDW: 13.1 % (ref 11.5–15.5)
WBC: 8.4 10*3/uL (ref 4.0–10.5)
nRBC: 0 % (ref 0.0–0.2)

## 2020-02-02 LAB — C-REACTIVE PROTEIN: CRP: 3.6 mg/dL — ABNORMAL HIGH (ref <1.0)

## 2020-02-02 LAB — COMPREHENSIVE METABOLIC PANEL
ALT: 30 U/L (ref 0–44)
AST: 24 U/L (ref 15–41)
Albumin: 2.8 g/dL — ABNORMAL LOW (ref 3.5–5.0)
Alkaline Phosphatase: 50 U/L (ref 38–126)
Anion gap: 10 (ref 5–15)
BUN: 49 mg/dL — ABNORMAL HIGH (ref 8–23)
CO2: 28 mmol/L (ref 22–32)
Calcium: 8.5 mg/dL — ABNORMAL LOW (ref 8.9–10.3)
Chloride: 103 mmol/L (ref 98–111)
Creatinine, Ser: 1.09 mg/dL — ABNORMAL HIGH (ref 0.44–1.00)
GFR calc Af Amer: 60 mL/min — ABNORMAL LOW (ref >60)
GFR calc non Af Amer: 51 mL/min — ABNORMAL LOW (ref >60)
Glucose, Bld: 184 mg/dL — ABNORMAL HIGH (ref 70–99)
Potassium: 4.5 mmol/L (ref 3.5–5.1)
Sodium: 141 mmol/L (ref 135–145)
Total Bilirubin: 0.6 mg/dL (ref 0.3–1.2)
Total Protein: 6.2 g/dL — ABNORMAL LOW (ref 6.5–8.1)

## 2020-02-02 LAB — FERRITIN: Ferritin: 470 ng/mL — ABNORMAL HIGH (ref 11–307)

## 2020-02-02 LAB — FIBRIN DERIVATIVES D-DIMER (ARMC ONLY): Fibrin derivatives D-dimer (ARMC): 599.63 ng/mL (FEU) — ABNORMAL HIGH (ref 0.00–499.00)

## 2020-02-02 LAB — GLUCOSE, CAPILLARY
Glucose-Capillary: 160 mg/dL — ABNORMAL HIGH (ref 70–99)
Glucose-Capillary: 191 mg/dL — ABNORMAL HIGH (ref 70–99)
Glucose-Capillary: 111 mg/dL — ABNORMAL HIGH (ref 70–99)
Glucose-Capillary: 205 mg/dL — ABNORMAL HIGH (ref 70–99)

## 2020-02-02 MED ORDER — INSULIN DETEMIR 100 UNIT/ML ~~LOC~~ SOLN
10.0000 [IU] | Freq: Every day | SUBCUTANEOUS | Status: DC
  Administered 2020-02-02 – 2020-02-05 (×4): 10 [IU] via SUBCUTANEOUS
  Filled 2020-02-02 (×6): qty 0.1

## 2020-02-02 MED ORDER — HYDRALAZINE HCL 50 MG PO TABS
25.0000 mg | ORAL_TABLET | Freq: Four times a day (QID) | ORAL | Status: DC | PRN
  Administered 2020-02-02 – 2020-02-03 (×3): 25 mg via ORAL
  Filled 2020-02-02 (×3): qty 1

## 2020-02-02 MED ORDER — AMLODIPINE BESYLATE 10 MG PO TABS
10.0000 mg | ORAL_TABLET | Freq: Every day | ORAL | Status: DC
  Administered 2020-02-02 – 2020-02-09 (×8): 10 mg via ORAL
  Filled 2020-02-02 (×8): qty 1

## 2020-02-02 NOTE — Progress Notes (Signed)
PROGRESS NOTE    Glenda Boone  WPY:[id number] DOB: 14-Mar-1951 DOA: 01/30/2020 PCP: [facility] [place]   Brief Narrative: Taken from [place] is a 69 y.o. female with medical history significant for COPD, diabetes mellitus, hypertension, morbid obesity, depression/anxiety, hypothyroidism who presents by EMS for shortness of breath and cough.  Tested positive with Covid on 01/23/20. Patient is unvaccinated. Admitted with acute respiratory failure secondary to COVID-19 pneumonia.  Subjective: Patient was feeling little better, no new complaints. Saturating well on 1L.Discontinued O2 and continue to remain above 90%.  Assessment & Plan:   Principal Problem:   Pneumonia due to COVID-19 virus Active Problems:   Depression, major, recurrent, moderate (HCC)   COPD (chronic obstructive pulmonary disease) (HCC)   Diabetic ketoacidosis with coma associated with type 2 diabetes mellitus (HCC)   Essential hypertension   Hypoxia   Obesity, Class III, BMI 40-49.9 (morbid obesity) ([place])   Acute respiratory failure with hypoxia (HCC)   Type 2 diabetes mellitus with hyperlipidemia (HCC)   Hypothyroidism  Acute hypoxic respiratory failure secondary to COVID-19 pneumonia. Patient was saturating in low 90s on 3 L when seen today. Feeling little better as compared to before. Elevated inflammatory markers which now trending down. -Continue remdesivir-day 4 -Continue Solu-Medrol. -Continue with supportive care and supplements. -Continue with supplemental oxygen-wean as tolerated. -Ambulate with pulse ox. -Continue to monitor inflammatory markers.  Essential hypertension. Blood pressure elevated. Discontinue home dose of HCTZ as patient appears dry and developed AKI. -Start her on amlodipine- increase dose to 10 mg daily. -Continue losartan-Renal functions are improving. -Add PRN hydralazine.  Hypothyroidism. -Continue home dose of Synthroid.  Type 2 diabetes  mellitus. CBG elevated. Patient is on steroid. -Add 10 units of levemir. -Continue with sliding scale and mealtime coverage.  AKI. Creatinine started improving, 1.09 today. Baseline below 1. -Discontinue HCTZ. -Continue gentle IV hydration. -Continue to monitor. -Avoid nephrotoxins.  History of depression. No acute concern. -Continue home dose of Paxil.  Morbid obesity. Body mass index is 49.38 kg/m.  This will complicate overall prognosis.  Objective: Vitals:   02/01/20 1508 02/01/20 1955 02/02/20 0429 02/02/20 0803  BP: (!) 158/64 (!) 163/77 (!) 176/88 (!) 197/94  Pulse: 62 70 (!) 52 61  Resp: (!) 21 20 18 18   Temp: 97.7 F (36.5 C) 97.6 F (36.4 C) 98.1 F (36.7 C) 97.9 F (36.6 C)  TempSrc: Oral Oral    SpO2: 94% 90% 98% 97%  Weight:      Height:        Intake/Output Summary (Last 24 hours) at 02/02/2020 0849 Last data filed at 02/02/2020 0827 Gross per 24 hour  Intake 1200 ml  Output --  Net 1200 ml   Filed Weights   01/30/20 1648  Weight: 122.5 kg    Examination:  General.  Well-developed, morbidly obese lady, in no acute distress. Pulmonary.  Lungs clear bilaterally, normal respiratory effort. CV.  Regular rate and rhythm, no JVD, rub or murmur. Abdomen.  Soft, nontender, nondistended, BS positive. CNS.  Alert and oriented x3.  No focal neurologic deficit. Extremities.  No edema, no cyanosis, pulses intact and symmetrical. Psychiatry.  Judgment and insight appears normal.  DVT prophylaxis: Lovenox Code Status: DNR Family Communication: Call daughter again with no response. Disposition Plan:  Status is: Inpatient  Remains inpatient appropriate because:Inpatient level of care appropriate due to severity of illness   Dispo: The patient is from: Home  Anticipated d/c is to: Home              Anticipated d/c date is: 1-2 days.              Patient currently is not medically stable to d/c.  Consultants:   None  Procedures:   Antimicrobials:   Data Reviewed: I have personally reviewed following labs and imaging studies  CBC: Recent Labs  Lab 01/30/20 1702 01/31/20 0508 02/01/20 0442 02/02/20 0544  WBC 8.3 7.8 8.3 8.4  NEUTROABS  --   --  7.1 7.2  HGB 11.9* 11.2* 10.1* 10.6*  HCT 33.5* 31.8* 29.2* 31.3*  MCV 97.7 96.4 96.4 97.8  PLT 312 268 295 517   Basic Metabolic Panel: Recent Labs  Lab 01/30/20 1702 01/31/20 0508 02/01/20 0442 02/02/20 0544  NA 140  --  137 141  K 3.6  --  4.1 4.5  CL 103  --  101 103  CO2 24  --  24 28  GLUCOSE 126*  --  180* 184*  BUN 29*  --  54* 49*  CREATININE 0.98 1.21* 1.24* 1.09*  CALCIUM 8.3*  --  8.3* 8.5*   GFR: Estimated Creatinine Clearance: 60 mL/min (A) (by C-G formula based on SCr of 1.09 mg/dL (H)). Liver Function Tests: Recent Labs  Lab 02/01/20 0442 02/02/20 0544  AST 20 24  ALT 30 30  ALKPHOS 56 50  BILITOT 0.9 0.6  PROT 6.4* 6.2*  ALBUMIN 3.0* 2.8*   No results for input(s): LIPASE, AMYLASE in the last 168 hours. No results for input(s): AMMONIA in the last 168 hours. Coagulation Profile: No results for input(s): INR, PROTIME in the last 168 hours. Cardiac Enzymes: No results for input(s): CKTOTAL, CKMB, CKMBINDEX, TROPONINI in the last 168 hours. BNP (last 3 results) No results for input(s): PROBNP in the last 8760 hours. HbA1C: No results for input(s): HGBA1C in the last 72 hours. CBG: Recent Labs  Lab 02/01/20 0802 02/01/20 1134 02/01/20 1634 02/01/20 2142 02/02/20 0801  GLUCAP 160* 201* 153* 170* 160*   Lipid Profile: No results for input(s): CHOL, HDL, LDLCALC, TRIG, CHOLHDL, LDLDIRECT in the last 72 hours. Thyroid Function Tests: No results for input(s): TSH, T4TOTAL, FREET4, T3FREE, THYROIDAB in the last 72 hours. Anemia Panel: Recent Labs    02/01/20 0442 02/02/20 0544  FERRITIN 678* 470*   Sepsis Labs: Recent Labs  Lab 01/31/20 0154  PROCALCITON 0.15    Recent Results (from the past 240 hour(s))  SARS  Coronavirus 2 by RT PCR (hospital order, performed in [facility] hospital lab) Nasopharyngeal Nasopharyngeal Swab     Status: Abnormal   Collection Time: 01/31/20 12:25 AM   Specimen: Nasopharyngeal Swab  Result Value Ref Range Status   SARS Coronavirus 2 POSITIVE (A) NEGATIVE Final    Comment: RESULT CALLED TO, READ BACK BY AND VERIFIED WITH:  ANNA JASPER AT 0803 01/31/20 SDR (NOTE) SARS-CoV-2 target nucleic acids are DETECTED  SARS-CoV-2 RNA is generally detectable in upper respiratory specimens  during the acute phase of infection.  Positive results are indicative  of the presence of the identified virus, but do not rule out bacterial infection or co-infection with other pathogens not detected by the test.  Clinical correlation with patient history and  other diagnostic information is necessary to determine patient infection status.  The expected result is negative.  Fact Sheet for Patients:   StrictlyIdeas.no   Fact Sheet for Healthcare Providers:   BankingDealers.co.za    This test  is not yet approved or cleared by the [place] and  has been authorized for detection and/or diagnosis of SARS-CoV-2 by FDA under an Emergency Use Authorization (EUA).  This EUA will remain in effect (meaning this test  can be used) for the duration of  the COVID-19 declaration under Section 564(b)(1) of the Act, 21 U.S.C. section 360-bbb-3(b)(1), unless the authorization is terminated or revoked sooner.  Performed at [facility], [address] [place], [place] [address]      Radiology Studies: No results found.  Scheduled Meds: . albuterol  2 puff Inhalation QID  . amLODipine  5 mg Oral Daily  . aspirin EC  81 mg Oral Daily  . atorvastatin  20 mg Oral Daily  . enoxaparin (LOVENOX) injection  40 mg Subcutaneous BID  . insulin aspart  0-15 Units Subcutaneous TID WC  . insulin aspart  0-5 Units Subcutaneous QHS  .  ipratropium-albuterol  3 mL Nebulization Once  . levothyroxine  200 mcg Oral QAC breakfast  . levothyroxine  75 mcg Oral QAC breakfast  . losartan  100 mg Oral Daily  . metFORMIN  1,000 mg Oral BID WC  . methylPREDNISolone (SOLU-MEDROL) injection  1 mg/kg Intravenous Q12H   Followed by  . [START ON 02/03/2020] predniSONE  50 mg Oral Daily  . mometasone-formoterol  2 puff Inhalation BID  . nicotine  14 mg Transdermal Daily  . PARoxetine  60 mg Oral Daily   Continuous Infusions: . lactated ringers 100 mL/hr at 01/31/20 0509  . lactated ringers 75 mL/hr at 02/01/20 1638  . remdesivir 100 mg in NS 100 mL 100 mg (02/01/20 0914)     LOS: 2 days   Time spent: 30 minutes.  Lorella Nimrod, MD [facility]  If 7PM-7AM, please contact night-coverage [email or web address]  02/02/2020, 8:49 AM   This record has been created using Systems analyst. Errors have been sought and corrected,but may not always be located. Such creation errors do not reflect on the standard of care.

## 2020-02-02 NOTE — Progress Notes (Signed)
Physical Therapy Treatment Patient Details Name: Glenda Boone MRN: [id number] DOB: 1951/01/18 Today's Date: 02/02/2020    History of Present Illness 69 y.o. female presenting to hospital with shortness of breath, fatigue and admitted with Covid PNA.  She was her 1 month ago with AMS, sepsis.  Pt recently evicted from her home and had been living in a hotel for 2 months; moved into friend's home (with daughter as well) at that discharge.  PMH includes anxiety, COPD, DM, htn, neuropathy, ovarian CA s/p chemo, TAH, shingles, L ankle suergery, MDD.    PT Comments    Pt eager to participate in therapy upon arrival.  Pt was able to perfrom sit <> stand with use of BUEs to push through arms of chair and stand.  Pt then proceeded to use SPC to ambulate in room and down hallway.  Pt also utilized railing in hallway to maintain balance.  Pt came back to room after ambulating 40' and left in recliner.  Pt able to perform seated exercises with SpO2 maintain >90% on 1L of O2.  All necessities placed within arms reach.  Pt will continue to progress with skilled therapy to address PT deficits listed below.  Current d/c recommendations still appropriate at this time.     Follow Up Recommendations  Home health PT (per progress)     Equipment Recommendations  Rolling walker with 5" wheels    Recommendations for Other Services       Precautions / Restrictions Precautions Precautions: None Restrictions Weight Bearing Restrictions: No    Mobility  Bed Mobility Overal bed mobility: Modified Independent             General bed mobility comments: light use of rails, able to rise to sitting EOB with ease  Transfers Overall transfer level: Modified independent Equipment used: Straight cane             General transfer comment: Pt with definite need of UEs, but no LOBs.  Ambulation/Gait Ambulation/Gait assistance: Supervision Gait Distance (Feet): 40 Feet Assistive device: Straight  cane Gait Pattern/deviations: Step-to pattern;Decreased stride length Gait velocity: decreased   General Gait Details: Pt with slow safe ambulation.  Pt did require rest breaks during ambulation in hall.   Stairs             Wheelchair Mobility    Modified Rankin (Stroke Patients Only)       Balance Overall balance assessment: Modified Independent                                          Cognition Arousal/Alertness: Awake/alert Behavior During Therapy: WFL for tasks assessed/performed Overall Cognitive Status: Within Functional Limits for tasks assessed                                        Exercises General Exercises - Lower Extremity Gluteal Sets: AROM;Strengthening;Both;10 reps;Seated Long Arc Quad: AROM;Strengthening;Both;10 reps;Seated Hip Flexion/Marching: AROM;Strengthening;Both;10 reps;Seated Other Exercises Other Exercises: Sit-to-Stand AROM; Strengthening; 10 reps; Seated    General Comments        Pertinent Vitals/Pain Pain Assessment: No/denies pain    Home Living                      Prior Function  PT Goals (current goals can now be found in the care plan section) Acute Rehab PT Goals Patient Stated Goal: get into her new home PT Goal Formulation: With patient Time For Goal Achievement: 02/14/20 Potential to Achieve Goals: Good Progress towards PT goals: Progressing toward goals    Frequency    Min 2X/week      PT Plan Current plan remains appropriate    Co-evaluation              AM-PAC PT "6 Clicks" Mobility   Outcome Measure  Help needed turning from your back to your side while in a flat bed without using bedrails?: None Help needed moving from lying on your back to sitting on the side of a flat bed without using bedrails?: None Help needed moving to and from a bed to a chair (including a wheelchair)?: None Help needed standing up from a chair using your arms  (e.g., wheelchair or bedside chair)?: None Help needed to walk in hospital room?: A Little Help needed climbing 3-5 steps with a railing? : A Lot 6 Click Score: 21    End of Session Equipment Utilized During Treatment: Oxygen (4L) Activity Tolerance: Patient limited by pain;Patient limited by fatigue Patient left: in chair;with call bell/phone within reach;with nursing/sitter in room Nurse Communication: Mobility status (vitals with activity) PT Visit Diagnosis: Muscle weakness (generalized) (M62.81);Difficulty in walking, not elsewhere classified (R26.2)     Time: 3300-7622 PT Time Calculation (min) (ACUTE ONLY): 16 min  Charges:  $Therapeutic Exercise: 8-22 mins                     Gwenlyn Saran, PT, DPT 02/02/20, 4:53 PM

## 2020-02-02 NOTE — Progress Notes (Signed)
Pt Glenda Boone, VS stable  Pt on room air sats of 94%. Pt desats when ambulating on room air to 86% needs 1L [place] when ambulating to maintain sats above 90% No complaints today. Up ad lib. IV fluids stopped. received IV remdesivir. Pt with good appetite. CBG monitoring and supplemental insulin given. Will continue to monitor.

## 2020-02-02 NOTE — TOC Initial Note (Signed)
Transition of Care [facility]) - Initial/Assessment Note    Patient Details  Name: Glenda Boone MRN: [id number] Date of Birth: Dec 03, 1950  Transition of [facility]) CM/SW Contact:    Shelbie Hutching, RN Phone Number: 02/02/2020, 3:23 PM  Clinical Narrative:                 Patient admitted to the hospital with COVID initially requiring oxygen 3L [place] now tolerating room air.  RNCM was able to speak with patient face to face in the room.  Patient is from home with her daughter, Glenda Boone.  Glenda Boone provides transportation for the patient.  Patient is current with her PCP at [facility] and gets her prescriptions from [place].  Patient has a cane at home and reports that she needs a new walker.  [employer] will provide a new walker and deliver to the patient's room before discharge.  Patient does not want home health at this time, she does not think she will need it.  Daughter will be able to come and pick her up at discharge.   TOC team will cont to follow.   Expected Discharge Plan: Home/Self Care Barriers to Discharge: Continued Medical Work up   Patient Goals and CMS Choice Patient states their goals for this hospitalization and ongoing recovery are:: To get back home      Expected Discharge Plan and Services Expected Discharge Plan: Home/Self Care   Discharge Planning Services: CM Consult Post Acute Care Choice: NA Living arrangements for the past 2 months: Apartment                 DME Arranged: Walker rolling DME Agency: [employer] Date DME Agency Contacted: 02/02/20 Time DME Agency Contacted: [phone] Representative spoke with at DME Agency: Mardene Celeste HH Arranged: Refused HH          Prior Living Arrangements/Services Living arrangements for the past 2 months: Apartment Lives with:: Adult Children Patient language and need for interpreter reviewed:: Yes Do you feel safe going back to the place where you live?: Yes      Need for Family Participation in Patient  Care: Yes (Comment) (COVID) Care giver support system in place?: Yes (comment) (daughter) Current home services: DME (cane) Criminal Activity/Legal Involvement Pertinent to Current Situation/Hospitalization: No - Comment as needed  Activities of Daily Living Home Assistive Devices/Equipment: Cane (specify quad or straight), Eyeglasses, Shower chair with back, Environmental consultant (specify type) ADL Screening (condition at time of admission) Patient's cognitive ability adequate to safely complete daily activities?: Yes Is the patient deaf or have difficulty hearing?: No Does the patient have difficulty seeing, even when wearing glasses/contacts?: No Does the patient have difficulty concentrating, remembering, or making decisions?: No Patient able to express need for assistance with ADLs?: Yes Does the patient have difficulty dressing or bathing?: No Independently performs ADLs?: Yes (appropriate for developmental age) Does the patient have difficulty walking or climbing stairs?: Yes Weakness of Legs: Both Weakness of Arms/Hands: None  Permission Sought/Granted Permission sought to share information with : Case Manager, Family Supports Permission granted to share information with : Yes, Verbal Permission Granted  Share Information with NAME: Glenda Boone     Permission granted to share info w Relationship: daughter     Emotional Assessment Appearance:: Appears stated age Attitude/Demeanor/Rapport: Engaged Affect (typically observed): Accepting Orientation: : Oriented to Self, Oriented to Place, Oriented to  Time, Oriented to Situation Alcohol / Substance Use: Not Applicable Psych Involvement: No (comment)  Admission diagnosis:  Tachypnea [R06.82]  Hypoxia [R09.02] Chronic obstructive pulmonary disease, unspecified COPD type ([place]) [J44.9] Pneumonia due to COVID-19 virus [U07.1, J12.82] Patient Active Problem List   Diagnosis Date Noted  . Pneumonia due to COVID-19 virus 01/31/2020  . COPD  (chronic obstructive pulmonary disease) ([place]) 01/31/2020  . Diabetic ketoacidosis with coma associated with type 2 diabetes mellitus ([place]) 01/31/2020  . Essential hypertension 01/31/2020  . Hypoxia 01/31/2020  . Obesity, Class III, BMI 40-49.9 (morbid obesity) ([place]) 01/31/2020  . Acute respiratory failure with hypoxia ([place])   . Type 2 diabetes mellitus with hyperlipidemia ([place])   . Hypothyroidism   . Acute metabolic encephalopathy 15/37/9432  . TIA (transient ischemic attack) 09/04/2019  . MDD (major depressive disorder), recurrent, in full remission ([place]) 04/25/2019  . Aphagia 03/09/2019  . Shingles outbreak 06/21/2015  . Chronic bronchitis ([place]) 06/21/2015  . 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[place]) 11/28/2014  . H/O diabetes mellitus 11/28/2014  . Anxiety, generalized 11/28/2014  . H/O hypercholesterolemia 11/28/2014  . H/O: HTN (hypertension) 11/28/2014  . H/O: hypothyroidism 11/28/2014  . Depression, major, recurrent, moderate ([place]) 11/28/2014  . H/O: obesity 11/28/2014  . H/O disease 11/28/2014  . Neuropathy 11/28/2014   PCP:  [place], [place]:   [facility] [address] [place] [address] Phone: [phone] Fax: [place] [address]), [place]) [place] [address] Phone: [phone] Fax: [phone]  [place], [place] [address] Phone: [phone] Fax: [phone]     Social Determinants of Health (SDOH) Interventions    Readmission Risk Interventions No flowsheet data found.

## 2020-02-03 LAB — CBC WITH DIFFERENTIAL/PLATELET
Abs Immature Granulocytes: 0.18 10*3/uL — ABNORMAL HIGH (ref 0.00–0.07)
Basophils Absolute: 0 10*3/uL (ref 0.0–0.1)
Basophils Relative: 0 %
Eosinophils Absolute: 0 10*3/uL (ref 0.0–0.5)
Eosinophils Relative: 0 %
HCT: 30.5 % — ABNORMAL LOW (ref 36.0–46.0)
Hemoglobin: 10.5 g/dL — ABNORMAL LOW (ref 12.0–15.0)
Immature Granulocytes: 2 %
Lymphocytes Relative: 7 %
Lymphs Abs: 0.6 10*3/uL — ABNORMAL LOW (ref 0.7–4.0)
MCH: 33.9 pg (ref 26.0–34.0)
MCHC: 34.4 g/dL (ref 30.0–36.0)
MCV: 98.4 fL (ref 80.0–100.0)
Monocytes Absolute: 0.4 10*3/uL (ref 0.1–1.0)
Monocytes Relative: 4 %
Neutro Abs: 7.3 10*3/uL (ref 1.7–7.7)
Neutrophils Relative %: 87 %
Platelets: 349 10*3/uL (ref 150–400)
RBC: 3.1 MIL/uL — ABNORMAL LOW (ref 3.87–5.11)
RDW: 13.2 % (ref 11.5–15.5)
WBC: 8.4 10*3/uL (ref 4.0–10.5)
nRBC: 0 % (ref 0.0–0.2)

## 2020-02-03 LAB — COMPREHENSIVE METABOLIC PANEL
ALT: 31 U/L (ref 0–44)
AST: 26 U/L (ref 15–41)
Albumin: 2.9 g/dL — ABNORMAL LOW (ref 3.5–5.0)
Alkaline Phosphatase: 50 U/L (ref 38–126)
Anion gap: 11 (ref 5–15)
BUN: 44 mg/dL — ABNORMAL HIGH (ref 8–23)
CO2: 27 mmol/L (ref 22–32)
Calcium: 8.4 mg/dL — ABNORMAL LOW (ref 8.9–10.3)
Chloride: 100 mmol/L (ref 98–111)
Creatinine, Ser: 1.02 mg/dL — ABNORMAL HIGH (ref 0.44–1.00)
GFR calc Af Amer: >60 mL/min (ref >60)
GFR calc non Af Amer: 56 mL/min — ABNORMAL LOW (ref >60)
Glucose, Bld: 239 mg/dL — ABNORMAL HIGH (ref 70–99)
Potassium: 4.4 mmol/L (ref 3.5–5.1)
Sodium: 138 mmol/L (ref 135–145)
Total Bilirubin: 0.8 mg/dL (ref 0.3–1.2)
Total Protein: 6.1 g/dL — ABNORMAL LOW (ref 6.5–8.1)

## 2020-02-03 LAB — GLUCOSE, CAPILLARY
Glucose-Capillary: 150 mg/dL — ABNORMAL HIGH (ref 70–99)
Glucose-Capillary: 168 mg/dL — ABNORMAL HIGH (ref 70–99)
Glucose-Capillary: 202 mg/dL — ABNORMAL HIGH (ref 70–99)
Glucose-Capillary: 167 mg/dL — ABNORMAL HIGH (ref 70–99)

## 2020-02-03 LAB — FERRITIN: Ferritin: 413 ng/mL — ABNORMAL HIGH (ref 11–307)

## 2020-02-03 LAB — FIBRIN DERIVATIVES D-DIMER (ARMC ONLY): Fibrin derivatives D-dimer (ARMC): 706.35 ng/mL (FEU) — ABNORMAL HIGH (ref 0.00–499.00)

## 2020-02-03 LAB — C-REACTIVE PROTEIN: CRP: 1.8 mg/dL — ABNORMAL HIGH (ref <1.0)

## 2020-02-03 MED ORDER — SODIUM CHLORIDE 0.9 % IV SOLN
INTRAVENOUS | Status: DC | PRN
  Administered 2020-02-03: 250 mL via INTRAVENOUS

## 2020-02-03 NOTE — Progress Notes (Signed)
Physical Therapy Treatment Patient Details Name: Glenda Boone MRN: [id number] DOB: [birth year] Today's Date: 02/03/2020    History of Present Illness 69 y.o. female presenting to hospital with shortness of breath, fatigue and admitted with Covid PNA.  She was her 1 month ago with AMS, sepsis.  Pt recently evicted from her home and had been living in a hotel for 2 months; moved into friend's home (with daughter as well) at that discharge.  PMH includes anxiety, COPD, DM, htn, neuropathy, ovarian CA s/p chemo, TAH, shingles, L ankle suergery, MDD.    PT Comments    Patient alert, agreeable to PT, reported that she needed to use the bathroom. Pt performed bed mobility modI, trialed on room air with RN consent. Pt able to ambulate to bathroom with SPC, spO2 readings in 90s, little SOB noted. Performed toileting and hand washing supervision. Upon ambulating to door of bathroom, pt exhibited significant SOB, and spO2 readings 70s (unclear pleth). After seated rest break, re-attempted ambulation to door with RW on room air, pt again exhibited significant shortness of breath and reported fatigue/weakness, placed on 3L to return to chair in room safely. Due to pt's deconditioning and pt now stating she would not have assistance at home, SNF recommendation placed to maximize safety, independence, and mobility. CM notified.      Follow Up Recommendations  SNF     Equipment Recommendations  Rolling walker with 5" wheels    Recommendations for Other Services       Precautions / Restrictions Precautions Precautions: Fall Restrictions Weight Bearing Restrictions: No    Mobility  Bed Mobility Overal bed mobility: Modified Independent             General bed mobility comments: light use of rails, able to rise to sitting EOB with ease  Transfers Overall transfer level: Modified independent Equipment used: Straight cane;Rolling walker (2 wheeled)             General transfer comment:  transitioned to RW due to worsened endurance today  Ambulation/Gait Ambulation/Gait assistance: Supervision     Gait Pattern/deviations: Step-to pattern;Decreased stride length     General Gait Details: several bouts of ambulation. Able to ambulate to bathroom with SPC. ambulated back to bed with cane due to SOB. re-attempted with RW, ambulated to door and back with RW, very SOB.   Stairs             Wheelchair Mobility    Modified Rankin (Stroke Patients Only)       Balance Overall balance assessment: Needs assistance Sitting-balance support: Feet supported Sitting balance-Leahy Scale: Good       Standing balance-Leahy Scale: Fair Standing balance comment: much more comfortable with UE support                            Cognition Arousal/Alertness: Awake/alert Behavior During Therapy: WFL for tasks assessed/performed Overall Cognitive Status: Within Functional Limits for tasks assessed                                        Exercises Other Exercises Other Exercises: Pt able to transfer to commode supervision, BUE support. performed toileting needs supervision, minA for donning new underwear. Washed her hands at the sink with supervision    General Comments        Pertinent Vitals/Pain Pain Assessment: No/denies pain  Home Living                      Prior Function            PT Goals (current goals can now be found in the care plan section) Progress towards PT goals: Progressing toward goals    Frequency    Min 2X/week      PT Plan Discharge plan needs to be updated    Co-evaluation              AM-PAC PT "6 Clicks" Mobility   Outcome Measure  Help needed turning from your back to your side while in a flat bed without using bedrails?: None Help needed moving from lying on your back to sitting on the side of a flat bed without using bedrails?: None Help needed moving to and from a bed to a  chair (including a wheelchair)?: None Help needed standing up from a chair using your arms (e.g., wheelchair or bedside chair)?: None Help needed to walk in hospital room?: A Little Help needed climbing 3-5 steps with a railing? : A Lot 6 Click Score: 21    End of Session Equipment Utilized During Treatment: Oxygen;Other (comment) (0L- 3L) Activity Tolerance: Patient limited by fatigue Patient left: in chair;with call bell/phone within reach;with nursing/sitter in room Nurse Communication: Mobility status PT Visit Diagnosis: Muscle weakness (generalized) (M62.81);Difficulty in walking, not elsewhere classified (R26.2)     Time: 9326-7124 PT Time Calculation (min) (ACUTE ONLY): 29 min  Charges:  $Therapeutic Exercise: 23-37 mins                    Lieutenant Diego PT, DPT 12:01 PM,02/03/20

## 2020-02-03 NOTE — TOC Progression Note (Signed)
Transition of Care [facility]) - Progression Note    Patient Details  Name: Glenda Boone MRN: [id number] Date of Birth: [birth year]  Transition of Care [facility]) CM/SW Contact  Shelbie Hutching, RN Phone Number: 02/03/2020, 2:58 PM  Clinical Narrative:    RNCM attempting to call the patient's daughter twice with no answer, left another message to return call.  Need to ensure that patient has a safe discharge plan and currently the patient expressed to the physician and nurse that she will not have the support she needs at home, that her daughter is not doing what she is supposed to.   SNF workup started but no bed offers yet.     Expected Discharge Plan: Home/Self Care Barriers to Discharge: Continued Medical Work up  Expected Discharge Plan and Services Expected Discharge Plan: Home/Self Care   Discharge Planning Services: CM Consult Post Acute Care Choice: NA Living arrangements for the past 2 months: Apartment                 DME Arranged: Walker rolling DME Agency: [employer] Date DME Agency Contacted: 02/02/20 Time DME Agency Contacted: [phone] Representative spoke with at DME Agency: Mardene Celeste HH Arranged: Refused [place]           Social Determinants of Health ([place]) Interventions    Readmission Risk Interventions Readmission Risk Prevention Plan 02/02/2020  Transportation Screening Complete  PCP or Specialist Appt within 3-5 Days Complete  HRI or [place] Complete  Social Work Consult for [place] Planning/Counseling Complete  Palliative Care Screening Not Applicable  Medication Review Press photographer) Complete  Some recent data might be hidden

## 2020-02-03 NOTE — Progress Notes (Signed)
PROGRESS NOTE    Glenda Boone  EVO:[id number] DOB: [birth year] DOA: 01/30/2020 PCP: [facility] [place]   Brief Narrative: Taken from [place] is a 69 y.o. female with medical history significant for COPD, diabetes mellitus, hypertension, morbid obesity, depression/anxiety, hypothyroidism who presents by EMS for shortness of breath and cough.  Tested positive with Covid on 01/23/20. Patient is unvaccinated. Admitted with acute respiratory failure secondary to COVID-19 pneumonia.  Subjective: Patient was feeling little short of breath after using the bathroom when seen today.  She does desaturate with minor exertion.  Maintaining little above 90% on room air while resting.  Assessment & Plan:   Principal Problem:   Pneumonia due to COVID-19 virus Active Problems:   Depression, major, recurrent, moderate (HCC)   COPD (chronic obstructive pulmonary disease) (HCC)   Diabetic ketoacidosis with coma associated with type 2 diabetes mellitus (HCC)   Essential hypertension   Hypoxia   Obesity, Class III, BMI 40-49.9 (morbid obesity) ([place])   Acute respiratory failure with hypoxia (HCC)   Type 2 diabetes mellitus with hyperlipidemia (HCC)   Hypothyroidism  Acute hypoxic respiratory failure secondary to COVID-19 pneumonia. Patient was saturating in low 90s on 3 L when seen today. Feeling little better as compared to before. Elevated inflammatory markers which now trending down. -Continue remdesivir-day 5 -Continue Solu-Medrol. -Continue with supportive care and supplements. -Continue with supplemental oxygen-wean as tolerated. -Ambulate with pulse ox. -Continue to monitor inflammatory markers.  Essential hypertension. Blood pressure elevated. Discontinue home dose of HCTZ as patient appears dry and developed AKI. -Continue amlodipine at 10 mg daily. -Continue losartan-Renal functions are improving. -Continue PRN hydralazine.  Hypothyroidism. -Continue  home dose of Synthroid.  Type 2 diabetes mellitus. CBG elevated. Patient is on steroid. -Add 10 units of levemir. -Continue with sliding scale and mealtime coverage.  AKI. Creatinine continue to improve, 1.02 today. Baseline below 1. -Discontinue HCTZ. -Encourage p.o. hydration -Continue to monitor. -Avoid nephrotoxins.  History of depression. No acute concern. -Continue home dose of Paxil.  Morbid obesity. Body mass index is 49.38 kg/m.  This will complicate overall prognosis.  Physical deconditioning. -PT is recommending SNF placement.  Objective: Vitals:   02/03/20 0745 02/03/20 0833 02/03/20 0834 02/03/20 1148  BP: (!) 190/97 (!) 178/89 (!) 178/89 (!) 155/66  Pulse: (!) 55 63 63 64  Resp:    19  Temp:    (!) 97.4 F (36.3 C)  TempSrc:    Oral  SpO2:    96%  Weight:      Height:        Intake/Output Summary (Last 24 hours) at 02/03/2020 1443 Last data filed at 02/03/2020 0900 Gross per 24 hour  Intake 2780 ml  Output --  Net 2780 ml   Filed Weights   01/30/20 1648  Weight: 122.5 kg    Examination:  General.  Morbidly obese lady, in no acute distress. Pulmonary.  Lungs clear bilaterally, normal respiratory effort. CV.  Regular rate and rhythm, no JVD, rub or murmur. Abdomen.  Soft, nontender, nondistended, BS positive. CNS.  Alert and oriented x3.  No focal neurologic deficit. Extremities.  No edema, no cyanosis, pulses intact and symmetrical. Psychiatry.  Judgment and insight appears normal.  DVT prophylaxis: Lovenox Code Status: DNR Family Communication: Call daughter again with no response.  TOC is also trying to reach daughter with no response. Disposition Plan:  Status is: Inpatient  Remains inpatient appropriate because:Inpatient level of care appropriate due to severity of  illness   Dispo: The patient is from: Home              Anticipated d/c is to: Home              Anticipated d/c date is: 1-2 days.              Patient currently  medically stable for discharge.  Original plan was to send her home today with home oxygen as he is just needing 2l with ambulation.  Unable to reach daughter.  TOC is working on it. Difficulty finding SNF due to Covid and insurance issues.  Unsafe discharge plan at this time.  Consultants:   None  Procedures:  Antimicrobials:   Data Reviewed: I have personally reviewed following labs and imaging studies  CBC: Recent Labs  Lab 01/30/20 1702 01/31/20 0508 02/01/20 0442 02/02/20 0544 02/03/20 0422  WBC 8.3 7.8 8.3 8.4 8.4  NEUTROABS  --   --  7.1 7.2 7.3  HGB 11.9* 11.2* 10.1* 10.6* 10.5*  HCT 33.5* 31.8* 29.2* 31.3* 30.5*  MCV 97.7 96.4 96.4 97.8 98.4  PLT 312 268 295 331 431   Basic Metabolic Panel: Recent Labs  Lab 01/30/20 1702 01/31/20 0508 02/01/20 0442 02/02/20 0544 02/03/20 0422  NA 140  --  137 141 138  K 3.6  --  4.1 4.5 4.4  CL 103  --  101 103 100  CO2 24  --  24 28 27   GLUCOSE 126*  --  180* 184* 239*  BUN 29*  --  54* 49* 44*  CREATININE 0.98 1.21* 1.24* 1.09* 1.02*  CALCIUM 8.3*  --  8.3* 8.5* 8.4*   GFR: Estimated Creatinine Clearance: 64.1 mL/min (A) (by C-G formula based on SCr of 1.02 mg/dL (H)). Liver Function Tests: Recent Labs  Lab 02/01/20 0442 02/02/20 0544 02/03/20 0422  AST 20 24 26   ALT 30 30 31   ALKPHOS 56 50 50  BILITOT 0.9 0.6 0.8  PROT 6.4* 6.2* 6.1*  ALBUMIN 3.0* 2.8* 2.9*   No results for input(s): LIPASE, AMYLASE in the last 168 hours. No results for input(s): AMMONIA in the last 168 hours. Coagulation Profile: No results for input(s): INR, PROTIME in the last 168 hours. Cardiac Enzymes: No results for input(s): CKTOTAL, CKMB, CKMBINDEX, TROPONINI in the last 168 hours. BNP (last 3 results) No results for input(s): PROBNP in the last 8760 hours. HbA1C: No results for input(s): HGBA1C in the last 72 hours. CBG: Recent Labs  Lab 02/02/20 1135 02/02/20 1627 02/02/20 2116 02/03/20 0815 02/03/20 1152  GLUCAP 191*  111* 205* 150* 168*   Lipid Profile: No results for input(s): CHOL, HDL, LDLCALC, TRIG, CHOLHDL, LDLDIRECT in the last 72 hours. Thyroid Function Tests: No results for input(s): TSH, T4TOTAL, FREET4, T3FREE, THYROIDAB in the last 72 hours. Anemia Panel: Recent Labs    02/02/20 0544 02/03/20 0422  FERRITIN 470* 413*   Sepsis Labs: Recent Labs  Lab 01/31/20 0154  PROCALCITON 0.15    Recent Results (from the past 240 hour(s))  SARS Coronavirus 2 by RT PCR (hospital order, performed in [facility] hospital lab) Nasopharyngeal Nasopharyngeal Swab     Status: Abnormal   Collection Time: 01/31/20 12:25 AM   Specimen: Nasopharyngeal Swab  Result Value Ref Range Status   SARS Coronavirus 2 POSITIVE (A) NEGATIVE Final    Comment: RESULT CALLED TO, READ BACK BY AND VERIFIED WITH:  ANNA JASPER AT 0803 01/31/20 SDR (NOTE) SARS-CoV-2 target nucleic acids are DETECTED  SARS-CoV-2 RNA is generally detectable in upper respiratory specimens  during the acute phase of infection.  Positive results are indicative  of the presence of the identified virus, but do not rule out bacterial infection or co-infection with other pathogens not detected by the test.  Clinical correlation with patient history and  other diagnostic information is necessary to determine patient infection status.  The expected result is negative.  Fact Sheet for Patients:   StrictlyIdeas.no   Fact Sheet for Healthcare Providers:   BankingDealers.co.za    This test is not yet approved or cleared by the [place] FDA and  has been authorized for detection and/or diagnosis of SARS-CoV-2 by FDA under an Emergency Use Authorization (EUA).  This EUA will remain in effect (meaning this test  can be used) for the duration of  the COVID-19 declaration under Section 564(b)(1) of the Act, 21 U.S.C. section 360-bbb-3(b)(1), unless the authorization is terminated or revoked  sooner.  Performed at [facility], [address], [place], [place] [address]      Radiology Studies: No results found.  Scheduled Meds: . albuterol  2 puff Inhalation QID  . amLODipine  10 mg Oral Daily  . aspirin EC  81 mg Oral Daily  . atorvastatin  20 mg Oral Daily  . enoxaparin (LOVENOX) injection  40 mg Subcutaneous BID  . insulin aspart  0-15 Units Subcutaneous TID WC  . insulin aspart  0-5 Units Subcutaneous QHS  . insulin detemir  10 Units Subcutaneous QHS  . ipratropium-albuterol  3 mL Nebulization Once  . levothyroxine  200 mcg Oral QAC breakfast  . levothyroxine  75 mcg Oral QAC breakfast  . losartan  100 mg Oral Daily  . metFORMIN  1,000 mg Oral BID WC  . mometasone-formoterol  2 puff Inhalation BID  . nicotine  14 mg Transdermal Daily  . PARoxetine  60 mg Oral Daily  . predniSONE  50 mg Oral Daily   Continuous Infusions: . sodium chloride 250 mL (02/03/20 1012)     LOS: 3 days   Time spent: 30 minutes.  Lorella Nimrod, MD [facility]  If 7PM-7AM, please contact night-coverage [email or web address]  02/03/2020, 2:43 PM   This record has been created using Systems analyst. Errors have been sought and corrected,but may not always be located. Such creation errors do not reflect on the standard of care.

## 2020-02-03 NOTE — NC FL2 (Signed)
[place] LEVEL OF CARE SCREENING TOOL     IDENTIFICATION  Patient Name: Glenda Boone Birthdate: 03-08-1951 Sex: female Admission Date (Current Location): 01/30/2020  [facility] and [place] Number:  Engineering geologist and Address:  [facility], [address], [place], [place] [address]      Provider Number: [id number]  Attending Physician Name and Address:  Lorella Nimrod, MD  Relative Name and Phone Number:  Alisan Dokes (daughter) [phone]    Current Level of Care: Hospital Recommended Level of Care: [place] Prior Approval Number:    Date Approved/Denied:   PASRR Number: waived  Discharge Plan: SNF    Current Diagnoses: Patient Active Problem List   Diagnosis Date Noted   Pneumonia due to COVID-19 virus 01/31/2020   COPD (chronic obstructive pulmonary disease) ([place]) 01/31/2020   Diabetic ketoacidosis with coma associated with type 2 diabetes mellitus ([place]) 01/31/2020   Essential hypertension 01/31/2020   Hypoxia 01/31/2020   Obesity, Class III, BMI 40-49.9 (morbid obesity) ([place]) 01/31/2020   Acute respiratory failure with hypoxia ([place])    Type 2 diabetes mellitus with hyperlipidemia ([place])    Hypothyroidism    Acute metabolic encephalopathy 62/69/4854   TIA (transient ischemic attack) 09/04/2019   MDD (major depressive disorder), recurrent, in full remission ([place]) 04/25/2019   Aphagia 03/09/2019   Shingles outbreak 06/21/2015   Chronic bronchitis ([place]) 06/21/2015   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[place]) 11/28/2014   H/O diabetes mellitus 11/28/2014   Anxiety, generalized 11/28/2014   H/O hypercholesterolemia 11/28/2014   H/O: HTN (hypertension) 11/28/2014   H/O: hypothyroidism 11/28/2014   Depression, major, recurrent, moderate ([place]) 11/28/2014   H/O: obesity 11/28/2014   H/O disease 11/28/2014   Neuropathy 11/28/2014    Orientation RESPIRATION BLADDER Height & Weight      Self, Time, Situation, Place  Normal ([place] 2L) Continent Weight: 122.5 kg Height:  5\' 2"  (157.5 cm)  BEHAVIORAL SYMPTOMS/MOOD NEUROLOGICAL BOWEL NUTRITION STATUS      Continent Diet (heart healthy)  AMBULATORY STATUS COMMUNICATION OF NEEDS Skin   Limited Assist Verbally Normal                       Personal Care Assistance Level of Assistance  Bathing, Feeding, Dressing Bathing Assistance: Limited assistance Feeding assistance: Independent Dressing Assistance: Limited assistance     Functional Limitations Info             SPECIAL CARE FACTORS FREQUENCY  PT (By licensed PT), OT (By licensed OT)     PT Frequency: 5 times per week OT Frequency: 5 times per week            Contractures Contractures Info: Not present    Additional Factors Info  Code Status, Allergies Code Status Info: DNR Allergies Info: Codeine, shellfish, demerol, gabapentin           Current Medications (02/03/2020):  This is the current hospital active medication list Current Facility-Administered Medications  Medication Dose Route Frequency Provider Last Rate Last Admin   0.9 %  sodium chloride infusion   Intravenous PRN Lorella Nimrod, MD 10 mL/hr at 02/03/20 1012 250 mL at 02/03/20 1012   acetaminophen (TYLENOL) tablet 650 mg  650 mg Oral Q6H PRN Chotiner, Yevonne Aline, MD       albuterol (VENTOLIN HFA) 108 (90 Base) MCG/ACT inhaler 2 puff  2 puff Inhalation QID Loletha Grayer, MD   2 puff at 02/03/20 1506   amLODipine (NORVASC) tablet  10 mg  10 mg Oral Daily Lorella Nimrod, MD   10 mg at 02/03/20 0835   aspirin EC tablet 81 mg  81 mg Oral Daily Chotiner, Yevonne Aline, MD   81 mg at 02/03/20 0828   atorvastatin (LIPITOR) tablet 20 mg  20 mg Oral Daily Rowland Lathe, RPH   20 mg at 02/03/20 4081   chlorpheniramine-HYDROcodone (TUSSIONEX) 10-8 MG/5ML suspension 5 mL  5 mL Oral Q12H PRN Chotiner, Yevonne Aline, MD   5 mL at 02/02/20 2151   enoxaparin (LOVENOX) injection 40 mg  40 mg  Subcutaneous BID Chotiner, Yevonne Aline, MD   40 mg at 02/03/20 0610   guaiFENesin-dextromethorphan (ROBITUSSIN DM) 100-10 MG/5ML syrup 10 mL  10 mL Oral Q4H PRN Chotiner, Yevonne Aline, MD       hydrALAZINE (APRESOLINE) tablet 25 mg  25 mg Oral Q6H PRN Lorella Nimrod, MD   25 mg at 02/03/20 0753   insulin aspart (novoLOG) injection 0-15 Units  0-15 Units Subcutaneous TID WC Chotiner, Yevonne Aline, MD   3 Units at 02/03/20 1238   insulin aspart (novoLOG) injection 0-5 Units  0-5 Units Subcutaneous QHS Chotiner, Yevonne Aline, MD   2 Units at 02/02/20 2149   insulin detemir (LEVEMIR) injection 10 Units  10 Units Subcutaneous QHS Lorella Nimrod, MD   10 Units at 02/02/20 2150   ipratropium-albuterol (DUONEB) 0.5-2.5 (3) MG/3ML nebulizer solution 3 mL  3 mL Nebulization Once Paulette Blanch, MD       levothyroxine (SYNTHROID) tablet 200 mcg  200 mcg Oral QAC breakfast Chotiner, Yevonne Aline, MD   200 mcg at 02/03/20 4481   levothyroxine (SYNTHROID) tablet 75 mcg  75 mcg Oral QAC breakfast Chotiner, Yevonne Aline, MD   75 mcg at 02/03/20 0610   losartan (COZAAR) tablet 100 mg  100 mg Oral Daily Chotiner, Yevonne Aline, MD   100 mg at 02/03/20 0836   metFORMIN (GLUCOPHAGE-XR) 24 hr tablet 1,000 mg  1,000 mg Oral BID WC Chotiner, Yevonne Aline, MD   1,000 mg at 02/03/20 0752   mometasone-formoterol (DULERA) 200-5 MCG/ACT inhaler 2 puff  2 puff Inhalation BID Chotiner, Yevonne Aline, MD   2 puff at 02/03/20 8563   nicotine (NICODERM CQ - dosed in mg/24 hours) patch 14 mg  14 mg Transdermal Daily Loletha Grayer, MD   14 mg at 02/03/20 0831   PARoxetine (PAXIL) tablet 60 mg  60 mg Oral Daily Chotiner, Yevonne Aline, MD   60 mg at 02/03/20 1497   predniSONE (DELTASONE) tablet 50 mg  50 mg Oral Daily Chotiner, Yevonne Aline, MD   50 mg at 02/03/20 0263   promethazine (PHENERGAN) tablet 12.5 mg  12.5 mg Oral Q6H PRN Chotiner, Yevonne Aline, MD       senna-docusate (Senokot-S) tablet 1 tablet  1 tablet Oral QHS PRN Chotiner, Yevonne Aline, MD          Discharge Medications: Please see discharge summary for a list of discharge medications.  Relevant Imaging Results:  Relevant Lab Results:   Additional Information SS# [id number]  Shelbie Hutching, RN

## 2020-02-04 LAB — GLUCOSE, CAPILLARY
Glucose-Capillary: 83 mg/dL (ref 70–99)
Glucose-Capillary: 133 mg/dL — ABNORMAL HIGH (ref 70–99)
Glucose-Capillary: 276 mg/dL — ABNORMAL HIGH (ref 70–99)
Glucose-Capillary: 147 mg/dL — ABNORMAL HIGH (ref 70–99)

## 2020-02-04 LAB — CBC WITH DIFFERENTIAL/PLATELET
Abs Immature Granulocytes: 0.4 10*3/uL — ABNORMAL HIGH (ref 0.00–0.07)
Basophils Absolute: 0 10*3/uL (ref 0.0–0.1)
Basophils Relative: 0 %
Eosinophils Absolute: 0.1 10*3/uL (ref 0.0–0.5)
Eosinophils Relative: 1 %
HCT: 31.6 % — ABNORMAL LOW (ref 36.0–46.0)
Hemoglobin: 10.6 g/dL — ABNORMAL LOW (ref 12.0–15.0)
Immature Granulocytes: 3 %
Lymphocytes Relative: 17 %
Lymphs Abs: 2 10*3/uL (ref 0.7–4.0)
MCH: 32.4 pg (ref 26.0–34.0)
MCHC: 33.5 g/dL (ref 30.0–36.0)
MCV: 96.6 fL (ref 80.0–100.0)
Monocytes Absolute: 0.9 10*3/uL (ref 0.1–1.0)
Monocytes Relative: 7 %
Neutro Abs: 8.2 10*3/uL — ABNORMAL HIGH (ref 1.7–7.7)
Neutrophils Relative %: 72 %
Platelets: 360 10*3/uL (ref 150–400)
RBC: 3.27 MIL/uL — ABNORMAL LOW (ref 3.87–5.11)
RDW: 13 % (ref 11.5–15.5)
WBC: 11.6 10*3/uL — ABNORMAL HIGH (ref 4.0–10.5)
nRBC: 0 % (ref 0.0–0.2)

## 2020-02-04 LAB — COMPREHENSIVE METABOLIC PANEL
ALT: 26 U/L (ref 0–44)
AST: 18 U/L (ref 15–41)
Albumin: 2.8 g/dL — ABNORMAL LOW (ref 3.5–5.0)
Alkaline Phosphatase: 46 U/L (ref 38–126)
Anion gap: 8 (ref 5–15)
BUN: 39 mg/dL — ABNORMAL HIGH (ref 8–23)
CO2: 32 mmol/L (ref 22–32)
Calcium: 8.5 mg/dL — ABNORMAL LOW (ref 8.9–10.3)
Chloride: 101 mmol/L (ref 98–111)
Creatinine, Ser: 0.92 mg/dL (ref 0.44–1.00)
GFR calc Af Amer: >60 mL/min (ref >60)
GFR calc non Af Amer: >60 mL/min (ref >60)
Glucose, Bld: 100 mg/dL — ABNORMAL HIGH (ref 70–99)
Potassium: 4.5 mmol/L (ref 3.5–5.1)
Sodium: 141 mmol/L (ref 135–145)
Total Bilirubin: 0.7 mg/dL (ref 0.3–1.2)
Total Protein: 5.8 g/dL — ABNORMAL LOW (ref 6.5–8.1)

## 2020-02-04 LAB — FERRITIN: Ferritin: 360 ng/mL — ABNORMAL HIGH (ref 11–307)

## 2020-02-04 LAB — C-REACTIVE PROTEIN: CRP: 0.8 mg/dL (ref <1.0)

## 2020-02-04 LAB — FIBRIN DERIVATIVES D-DIMER (ARMC ONLY): Fibrin derivatives D-dimer (ARMC): 822.53 ng/mL (FEU) — ABNORMAL HIGH (ref 0.00–499.00)

## 2020-02-04 NOTE — Progress Notes (Signed)
Patient is resting in bed with call bell in reach. ambulates with cane to bathroom. No distress or pain on assessment. While ambulating from bathroom back to bed she stumbled but did not fall, assisted her back to bed. Bed in low position and call bell in reach.

## 2020-02-04 NOTE — Progress Notes (Signed)
PROGRESS NOTE    Glenda Boone  ZES:[id number] DOB: 11-Oct-1950 DOA: 01/30/2020 PCP: [facility] [place]   Brief Narrative: Taken from [place] is a 69 y.o. female with medical history significant for COPD, diabetes mellitus, hypertension, morbid obesity, depression/anxiety, hypothyroidism who presents by EMS for shortness of breath and cough.  Tested positive with Covid on 01/23/20. Patient is unvaccinated. Admitted with acute respiratory failure secondary to COVID-19 pneumonia.  Subjective: Patient was feeling better when seen today.  No new complaints.  Assessment & Plan:   Principal Problem:   Pneumonia due to COVID-19 virus Active Problems:   Depression, major, recurrent, moderate (HCC)   COPD (chronic obstructive pulmonary disease) (HCC)   Diabetic ketoacidosis with coma associated with type 2 diabetes mellitus (HCC)   Essential hypertension   Hypoxia   Obesity, Class III, BMI 40-49.9 (morbid obesity) ([place])   Acute respiratory failure with hypoxia (HCC)   Type 2 diabetes mellitus with hyperlipidemia (HCC)   Hypothyroidism  Acute hypoxic respiratory failure secondary to COVID-19 pneumonia. Patient was saturating in low 90s on 1 L when seen today. Elevated inflammatory markers which now trending down. -Completed remdesivir. -Continue prednisone for another 4 days. -Continue with supportive care and supplements. -Continue with supplemental oxygen-wean as tolerated. -Ambulate with pulse ox. -Continue to monitor inflammatory markers. -PT is recommending SNF placement-no bed offer yet.  Essential hypertension. Blood pressure elevated. Discontinue home dose of HCTZ as patient appears dry and developed AKI. -Continue amlodipine at 10 mg daily. -Continue losartan-Renal functions are improving. -Restart home dose of HCTZ from tomorrow. -Continue PRN hydralazine.  Hypothyroidism. -Continue home dose of Synthroid.  Type 2 diabetes mellitus. CBG  elevated. Patient is on steroid. -Add 10 units of levemir. -Continue with sliding scale and mealtime coverage.  AKI.  Resolved -Restart home dose of HCTZ. -Encourage p.o. hydration -Continue to monitor. -Avoid nephrotoxins.  History of depression. No acute concern. -Continue home dose of Paxil.  Morbid obesity. Body mass index is 49.38 kg/m.  This will complicate overall prognosis.  Physical deconditioning. -PT is recommending SNF placement.-No bed offer yet.  Objective: Vitals:   02/03/20 2200 02/03/20 2357 02/04/20 0505 02/04/20 0738  BP:  (!) 170/83 (!) 164/86 (!) 149/83  Pulse:  68 67 85  Resp:  18 16 18   Temp:  98.1 F (36.7 C) 97.8 F (36.6 C) 97.8 F (36.6 C)  TempSrc:  Oral Oral Oral  SpO2: 93% 97% 98% 96%  Weight:      Height:        Intake/Output Summary (Last 24 hours) at 02/04/2020 0801 Last data filed at 02/03/2020 1600 Gross per 24 hour  Intake 721.32 ml  Output --  Net 721.32 ml   Filed Weights   01/30/20 1648  Weight: 122.5 kg    Examination:  General.  Pleasant elderly lady,in no acute distress. Pulmonary.  Lungs clear bilaterally, normal respiratory effort. CV.  Regular rate and rhythm, no JVD, rub or murmur. Abdomen.  Soft, nontender, nondistended, BS positive. CNS.  Alert and oriented x3.  No focal neurologic deficit. Extremities.  No edema, no cyanosis, pulses intact and symmetrical. Psychiatry.  Judgment and insight appears normal.  DVT prophylaxis: Lovenox Code Status: DNR Family Communication: Call daughter again with no response.  Disposition Plan:  Status is: Inpatient  Remains inpatient appropriate because:Inpatient level of care appropriate due to severity of illness   Dispo: The patient is from: Home  Anticipated d/c is to: SNF              Anticipated d/c date is: 1-2 days.              Patient currently medically stable for discharge.  Original plan was to send her home today with home oxygen as he is just  needing 2l with ambulation.  Unable to reach daughter.  TOC is working on it. Difficulty finding SNF due to Covid and insurance issues.  Unsafe discharge plan at this time.  Consultants:   None  Procedures:  Antimicrobials:   Data Reviewed: I have personally reviewed following labs and imaging studies  CBC: Recent Labs  Lab 01/30/20 1702 01/31/20 0508 02/01/20 0442 02/02/20 0544 02/03/20 0422  WBC 8.3 7.8 8.3 8.4 8.4  NEUTROABS  --   --  7.1 7.2 7.3  HGB 11.9* 11.2* 10.1* 10.6* 10.5*  HCT 33.5* 31.8* 29.2* 31.3* 30.5*  MCV 97.7 96.4 96.4 97.8 98.4  PLT 312 268 295 331 884   Basic Metabolic Panel: Recent Labs  Lab 01/30/20 1702 01/31/20 0508 02/01/20 0442 02/02/20 0544 02/03/20 0422  NA 140  --  137 141 138  K 3.6  --  4.1 4.5 4.4  CL 103  --  101 103 100  CO2 24  --  24 28 27   GLUCOSE 126*  --  180* 184* 239*  BUN 29*  --  54* 49* 44*  CREATININE 0.98 1.21* 1.24* 1.09* 1.02*  CALCIUM 8.3*  --  8.3* 8.5* 8.4*   GFR: Estimated Creatinine Clearance: 64.1 mL/min (A) (by C-G formula based on SCr of 1.02 mg/dL (H)). Liver Function Tests: Recent Labs  Lab 02/01/20 0442 02/02/20 0544 02/03/20 0422  AST 20 24 26   ALT 30 30 31   ALKPHOS 56 50 50  BILITOT 0.9 0.6 0.8  PROT 6.4* 6.2* 6.1*  ALBUMIN 3.0* 2.8* 2.9*   No results for input(s): LIPASE, AMYLASE in the last 168 hours. No results for input(s): AMMONIA in the last 168 hours. Coagulation Profile: No results for input(s): INR, PROTIME in the last 168 hours. Cardiac Enzymes: No results for input(s): CKTOTAL, CKMB, CKMBINDEX, TROPONINI in the last 168 hours. BNP (last 3 results) No results for input(s): PROBNP in the last 8760 hours. HbA1C: No results for input(s): HGBA1C in the last 72 hours. CBG: Recent Labs  Lab 02/02/20 2116 02/03/20 0815 02/03/20 1152 02/03/20 1612 02/03/20 2107  GLUCAP 205* 150* 168* 202* 167*   Lipid Profile: No results for input(s): CHOL, HDL, LDLCALC, TRIG, CHOLHDL,  LDLDIRECT in the last 72 hours. Thyroid Function Tests: No results for input(s): TSH, T4TOTAL, FREET4, T3FREE, THYROIDAB in the last 72 hours. Anemia Panel: Recent Labs    02/02/20 0544 02/03/20 0422  FERRITIN 470* 413*   Sepsis Labs: Recent Labs  Lab 01/31/20 0154  PROCALCITON 0.15    Recent Results (from the past 240 hour(s))  SARS Coronavirus 2 by RT PCR (hospital order, performed in [facility] hospital lab) Nasopharyngeal Nasopharyngeal Swab     Status: Abnormal   Collection Time: 01/31/20 12:25 AM   Specimen: Nasopharyngeal Swab  Result Value Ref Range Status   SARS Coronavirus 2 POSITIVE (A) NEGATIVE Final    Comment: RESULT CALLED TO, READ BACK BY AND VERIFIED WITH:  ANNA JASPER AT 0803 01/31/20 SDR (NOTE) SARS-CoV-2 target nucleic acids are DETECTED  SARS-CoV-2 RNA is generally detectable in upper respiratory specimens  during the acute phase of infection.  Positive results are indicative  of the presence of the identified virus, but do not rule out bacterial infection or co-infection with other pathogens not detected by the test.  Clinical correlation with patient history and  other diagnostic information is necessary to determine patient infection status.  The expected result is negative.  Fact Sheet for Patients:   StrictlyIdeas.no   Fact Sheet for Healthcare Providers:   BankingDealers.co.za    This test is not yet approved or cleared by the [place] FDA and  has been authorized for detection and/or diagnosis of SARS-CoV-2 by FDA under an Emergency Use Authorization (EUA).  This EUA will remain in effect (meaning this test  can be used) for the duration of  the COVID-19 declaration under Section 564(b)(1) of the Act, 21 U.S.C. section 360-bbb-3(b)(1), unless the authorization is terminated or revoked sooner.  Performed at [facility], [address] [place], [place] [address]       Radiology Studies: No results found.  Scheduled Meds: . albuterol  2 puff Inhalation QID  . amLODipine  10 mg Oral Daily  . aspirin EC  81 mg Oral Daily  . atorvastatin  20 mg Oral Daily  . enoxaparin (LOVENOX) injection  40 mg Subcutaneous BID  . insulin aspart  0-15 Units Subcutaneous TID WC  . insulin aspart  0-5 Units Subcutaneous QHS  . insulin detemir  10 Units Subcutaneous QHS  . ipratropium-albuterol  3 mL Nebulization Once  . levothyroxine  200 mcg Oral QAC breakfast  . levothyroxine  75 mcg Oral QAC breakfast  . losartan  100 mg Oral Daily  . metFORMIN  1,000 mg Oral BID WC  . mometasone-formoterol  2 puff Inhalation BID  . nicotine  14 mg Transdermal Daily  . PARoxetine  60 mg Oral Daily  . predniSONE  50 mg Oral Daily   Continuous Infusions: . sodium chloride Stopped (02/03/20 1051)     LOS: 4 days   Time spent: 25 minutes.  Lorella Nimrod, MD [facility]  If 7PM-7AM, please contact night-coverage [email or web address]  02/04/2020, 8:01 AM   This record has been created using Systems analyst. Errors have been sought and corrected,but may not always be located. Such creation errors do not reflect on the standard of care.

## 2020-02-05 LAB — CBC WITH DIFFERENTIAL/PLATELET
Abs Immature Granulocytes: 0.52 10*3/uL — ABNORMAL HIGH (ref 0.00–0.07)
Basophils Absolute: 0 10*3/uL (ref 0.0–0.1)
Basophils Relative: 0 %
Eosinophils Absolute: 0.1 10*3/uL (ref 0.0–0.5)
Eosinophils Relative: 1 %
HCT: 30.9 % — ABNORMAL LOW (ref 36.0–46.0)
Hemoglobin: 10.4 g/dL — ABNORMAL LOW (ref 12.0–15.0)
Immature Granulocytes: 5 %
Lymphocytes Relative: 16 %
Lymphs Abs: 1.6 10*3/uL (ref 0.7–4.0)
MCH: 32.6 pg (ref 26.0–34.0)
MCHC: 33.7 g/dL (ref 30.0–36.0)
MCV: 96.9 fL (ref 80.0–100.0)
Monocytes Absolute: 0.8 10*3/uL (ref 0.1–1.0)
Monocytes Relative: 8 %
Neutro Abs: 7.5 10*3/uL (ref 1.7–7.7)
Neutrophils Relative %: 70 %
Platelets: 345 10*3/uL (ref 150–400)
RBC: 3.19 MIL/uL — ABNORMAL LOW (ref 3.87–5.11)
RDW: 13.2 % (ref 11.5–15.5)
WBC: 10.6 10*3/uL — ABNORMAL HIGH (ref 4.0–10.5)
nRBC: 0.2 % (ref 0.0–0.2)

## 2020-02-05 LAB — COMPREHENSIVE METABOLIC PANEL
ALT: 25 U/L (ref 0–44)
AST: 17 U/L (ref 15–41)
Albumin: 2.8 g/dL — ABNORMAL LOW (ref 3.5–5.0)
Alkaline Phosphatase: 42 U/L (ref 38–126)
Anion gap: 8 (ref 5–15)
BUN: 37 mg/dL — ABNORMAL HIGH (ref 8–23)
CO2: 30 mmol/L (ref 22–32)
Calcium: 8.5 mg/dL — ABNORMAL LOW (ref 8.9–10.3)
Chloride: 102 mmol/L (ref 98–111)
Creatinine, Ser: 0.99 mg/dL (ref 0.44–1.00)
GFR calc Af Amer: >60 mL/min (ref >60)
GFR calc non Af Amer: 58 mL/min — ABNORMAL LOW (ref >60)
Glucose, Bld: 95 mg/dL (ref 70–99)
Potassium: 4.7 mmol/L (ref 3.5–5.1)
Sodium: 140 mmol/L (ref 135–145)
Total Bilirubin: 0.6 mg/dL (ref 0.3–1.2)
Total Protein: 5.7 g/dL — ABNORMAL LOW (ref 6.5–8.1)

## 2020-02-05 LAB — GLUCOSE, CAPILLARY
Glucose-Capillary: 85 mg/dL (ref 70–99)
Glucose-Capillary: 128 mg/dL — ABNORMAL HIGH (ref 70–99)
Glucose-Capillary: 170 mg/dL — ABNORMAL HIGH (ref 70–99)
Glucose-Capillary: 168 mg/dL — ABNORMAL HIGH (ref 70–99)

## 2020-02-05 LAB — FIBRIN DERIVATIVES D-DIMER (ARMC ONLY): Fibrin derivatives D-dimer (ARMC): 586.23 ng/mL (FEU) — ABNORMAL HIGH (ref 0.00–499.00)

## 2020-02-05 LAB — FERRITIN: Ferritin: 311 ng/mL — ABNORMAL HIGH (ref 11–307)

## 2020-02-05 LAB — C-REACTIVE PROTEIN: CRP: 1.3 mg/dL — ABNORMAL HIGH (ref <1.0)

## 2020-02-05 MED ORDER — HYDROCHLOROTHIAZIDE 25 MG PO TABS
25.0000 mg | ORAL_TABLET | Freq: Every day | ORAL | Status: DC
  Administered 2020-02-05 – 2020-02-09 (×5): 25 mg via ORAL
  Filled 2020-02-05 (×5): qty 1

## 2020-02-05 NOTE — Progress Notes (Signed)
PROGRESS NOTE    Glenda Boone  HYW:[id number] DOB: 1951/05/14 DOA: 01/30/2020 [facility]CP: [facility] [place]   Brief Narrative: Taken from [place] is a 69 y.o. female with medical history significant for COPD, diabetes mellitus, hypertension, morbid obesity, depression/anxiety, hypothyroidism who presents by EMS for shortness of breath and cough.  Tested positive with Covid on 01/23/20. Patient is unvaccinated. Admitted with acute respiratory failure secondary to COVID-19 pneumonia.  Subjective: Pt. Was feeling better when seen today. No new complaints. Saturating 99% on 1 L. Took off from oxygen.   Assessment & Plan:   Principal Problem:   Pneumonia due to COVID-19 virus Active Problems:   Depression, major, recurrent, moderate (HCC)   COPD (chronic obstructive pulmonary disease) (HCC)   Diabetic ketoacidosis with coma associated with type 2 diabetes mellitus (HCC)   Essential hypertension   Hypoxia   Obesity, Class III, BMI 40-49.9 (morbid obesity) ([place])   Acute respiratory failure with hypoxia (HCC)   Type 2 diabetes mellitus with hyperlipidemia (HCC)   Hypothyroidism  Acute hypoxic respiratory failure secondary to COVID-19 pneumonia. Patient was saturating well on 1 L, able to maintain saturation without oxygen when seen today.  Elevated inflammatory markers which now trending down. -Completed remdesivir. -Continue prednisone for another 2 days. -Continue with supportive care and supplements. -Continue with supplemental oxygen-wean as tolerated. -Ambulate with pulse ox. -Continue to monitor inflammatory markers. -PT is recommending SNF placement-no bed offer yet.  Essential hypertension. Blood pressure mildly elevated.  Order from outside -Continue amlodipine at 10 mg daily. -Continue losartan-Renal functions are improving. -Restart home dose of HCTZ . -Continue PRN hydralazine.  Hypothyroidism. -Continue home dose of Synthroid.  Type 2  diabetes mellitus. CBG improved. Patient is on steroid. -Add 10 units of levemir. -Continue with sliding scale and mealtime coverage.  AKI.  Resolved -Restart home dose of HCTZ. -Encourage p.o. hydration -Continue to monitor. -Avoid nephrotoxins.  History of depression. No acute concern. -Continue home dose of Paxil.  Morbid obesity. Body mass index is 49.38 kg/m.  This will complicate overall prognosis.  Physical deconditioning. -PT is recommending SNF placement.-No bed offer yet.  Objective: Vitals:   02/04/20 1143 02/04/20 1555 02/04/20 2111 02/05/20 0754  BP: (!) 146/90 134/60 (!) 157/84 (!) 155/72  Pulse: 73 79 66 67  Resp: 18 16 18 19   Temp: 98.6 F (37 C) 98.5 F (36.9 C) 98.7 F (37.1 C) 97.8 F (36.6 C)  TempSrc: Oral Oral Oral Oral  SpO2: 95% 97% 99% 92%  Weight:      Height:       No intake or output data in the 24 hours ending 02/05/20 0859 Filed Weights   01/30/20 1648  Weight: 122.5 kg    Examination:  General.  Well-developed elderly lady, in no acute distress. Pulmonary.  Lungs clear bilaterally, normal respiratory effort. CV.  Regular rate and rhythm, no JVD, rub or murmur. Abdomen.  Soft, nontender, nondistended, BS positive. CNS.  Alert and oriented x3.  No focal neurologic deficit. Extremities.  No edema, no cyanosis, pulses intact and symmetrical. Psychiatry.  Judgment and insight appears normal.  DVT prophylaxis: Lovenox Code Status: DNR Family Communication: Call daughter again with no response.  Disposition Plan:  Status is: Inpatient  Remains inpatient appropriate because:Inpatient level of care appropriate due to severity of illness   Dispo: The patient is from: Home              Anticipated d/c is to: SNF  Anticipated d/c date is: 1-2 days.              Patient currently medically stable for discharge.  Original plan was to send her home today with home oxygen as he is just needing 2l with ambulation.  Unable to  reach daughter.  TOC is working on it. Difficulty finding SNF due to Covid and insurance issues.  Unsafe discharge plan at this time.  Consultants:   None  Procedures:  Antimicrobials:   Data Reviewed: I have personally reviewed following labs and imaging studies  CBC: Recent Labs  Lab 02/01/20 0442 02/02/20 0544 02/03/20 0422 02/04/20 0716 02/05/20 0538  WBC 8.3 8.4 8.4 11.6* 10.6*  NEUTROABS 7.1 7.2 7.3 8.2* 7.5  HGB 10.1* 10.6* 10.5* 10.6* 10.4*  HCT 29.2* 31.3* 30.5* 31.6* 30.9*  MCV 96.4 97.8 98.4 96.6 96.9  PLT 295 331 349 360 814   Basic Metabolic Panel: Recent Labs  Lab 02/01/20 0442 02/02/20 0544 02/03/20 0422 02/04/20 0716 02/05/20 0538  NA 137 141 138 141 140  K 4.1 4.5 4.4 4.5 4.7  CL 101 103 100 101 102  CO2 24 28 27  32 30  GLUCOSE 180* 184* 239* 100* 95  BUN 54* 49* 44* 39* 37*  CREATININE 1.24* 1.09* 1.02* 0.92 0.99  CALCIUM 8.3* 8.5* 8.4* 8.5* 8.5*   GFR: Estimated Creatinine Clearance: 66 mL/min (by C-G formula based on SCr of 0.99 mg/dL). Liver Function Tests: Recent Labs  Lab 02/01/20 0442 02/02/20 0544 02/03/20 0422 02/04/20 0716 02/05/20 0538  AST 20 24 26 18 17   ALT 30 30 31 26 25   ALKPHOS 56 50 50 46 42  BILITOT 0.9 0.6 0.8 0.7 0.6  PROT 6.4* 6.2* 6.1* 5.8* 5.7*  ALBUMIN 3.0* 2.8* 2.9* 2.8* 2.8*   No results for input(s): LIPASE, AMYLASE in the last 168 hours. No results for input(s): AMMONIA in the last 168 hours. Coagulation Profile: No results for input(s): INR, PROTIME in the last 168 hours. Cardiac Enzymes: No results for input(s): CKTOTAL, CKMB, CKMBINDEX, TROPONINI in the last 168 hours. BNP (last 3 results) No results for input(s): PROBNP in the last 8760 hours. HbA1C: No results for input(s): HGBA1C in the last 72 hours. CBG: Recent Labs  Lab 02/04/20 0809 02/04/20 1143 02/04/20 1651 02/04/20 2108 02/05/20 0752  GLUCAP 83 133* 276* 147* 85   Lipid Profile: No results for input(s): CHOL, HDL, LDLCALC,  TRIG, CHOLHDL, LDLDIRECT in the last 72 hours. Thyroid Function Tests: No results for input(s): TSH, T4TOTAL, FREET4, T3FREE, THYROIDAB in the last 72 hours. Anemia Panel: Recent Labs    02/04/20 0716 02/05/20 0538  FERRITIN 360* 311*   Sepsis Labs: Recent Labs  Lab 01/31/20 0154  PROCALCITON 0.15    Recent Results (from the past 240 hour(s))  SARS Coronavirus 2 by RT PCR (hospital order, performed in [facility] hospital lab) Nasopharyngeal Nasopharyngeal Swab     Status: Abnormal   Collection Time: 01/31/20 12:25 AM   Specimen: Nasopharyngeal Swab  Result Value Ref Range Status   SARS Coronavirus 2 POSITIVE (A) NEGATIVE Final    Comment: RESULT CALLED TO, READ BACK BY AND VERIFIED WITH:  ANNA JASPER AT 0803 01/31/20 SDR (NOTE) SARS-CoV-2 target nucleic acids are DETECTED  SARS-CoV-2 RNA is generally detectable in upper respiratory specimens  during the acute phase of infection.  Positive results are indicative  of the presence of the identified virus, but do not rule out bacterial infection or co-infection with other pathogens not detected by  the test.  Clinical correlation with patient history and  other diagnostic information is necessary to determine patient infection status.  The expected result is negative.  Fact Sheet for Patients:   StrictlyIdeas.no   Fact Sheet for Healthcare Providers:   BankingDealers.co.za    This test is not yet approved or cleared by the [place] FDA and  has been authorized for detection and/or diagnosis of SARS-CoV-2 by FDA under an Emergency Use Authorization (EUA).  This EUA will remain in effect (meaning this test  can be used) for the duration of  the COVID-19 declaration under Section 564(b)(1) of the Act, 21 U.S.C. section 360-bbb-3(b)(1), unless the authorization is terminated or revoked sooner.  Performed at [facility], [address] [place], [place] [address]        Radiology Studies: No results found.  Scheduled Meds: . albuterol  2 puff Inhalation QID  . amLODipine  10 mg Oral Daily  . aspirin EC  81 mg Oral Daily  . atorvastatin  20 mg Oral Daily  . enoxaparin (LOVENOX) injection  40 mg Subcutaneous BID  . insulin aspart  0-15 Units Subcutaneous TID WC  . insulin aspart  0-5 Units Subcutaneous QHS  . insulin detemir  10 Units Subcutaneous QHS  . ipratropium-albuterol  3 mL Nebulization Once  . levothyroxine  200 mcg Oral QAC breakfast  . levothyroxine  75 mcg Oral QAC breakfast  . losartan  100 mg Oral Daily  . metFORMIN  1,000 mg Oral BID WC  . mometasone-formoterol  2 puff Inhalation BID  . nicotine  14 mg Transdermal Daily  . PARoxetine  60 mg Oral Daily  . predniSONE  50 mg Oral Daily   Continuous Infusions: . sodium chloride Stopped (02/03/20 1051)     LOS: 5 days   Time spent: 25 minutes.  Lorella Nimrod, MD [facility]  If 7PM-7AM, please contact night-coverage [email or web address]  02/05/2020, 8:59 AM   This record has been created using Systems analyst. Errors have been sought and corrected,but may not always be located. Such creation errors do not reflect on the standard of care.

## 2020-02-06 LAB — GLUCOSE, CAPILLARY
Glucose-Capillary: 67 mg/dL — ABNORMAL LOW (ref 70–99)
Glucose-Capillary: 110 mg/dL — ABNORMAL HIGH (ref 70–99)
Glucose-Capillary: 115 mg/dL — ABNORMAL HIGH (ref 70–99)
Glucose-Capillary: 187 mg/dL — ABNORMAL HIGH (ref 70–99)
Glucose-Capillary: 175 mg/dL — ABNORMAL HIGH (ref 70–99)

## 2020-02-06 MED ORDER — INSULIN DETEMIR 100 UNIT/ML ~~LOC~~ SOLN
5.0000 [IU] | Freq: Every day | SUBCUTANEOUS | Status: DC
  Administered 2020-02-06 – 2020-02-15 (×10): 5 [IU] via SUBCUTANEOUS
  Filled 2020-02-06 (×11): qty 0.05

## 2020-02-06 NOTE — Progress Notes (Signed)
PROGRESS NOTE    Glenda Boone  IPJ:[id number] DOB: 30-Mar-1951 DOA: 01/30/2020 PCP: [facility] [place]   Brief Narrative: Taken from [place] is a 69 y.o. female with medical history significant for COPD, diabetes mellitus, hypertension, morbid obesity, depression/anxiety, hypothyroidism who presents by EMS for shortness of breath and cough.  Tested positive with Covid on 01/23/20. Patient is unvaccinated. Admitted with acute respiratory failure secondary to COVID-19 pneumonia.  Subjective: Patient was sleeping comfortably when seen today.  Still waiting for bed offer. Saturating well on room air while resting.  Assessment & Plan:   Principal Problem:   Pneumonia due to COVID-19 virus Active Problems:   Depression, major, recurrent, moderate (HCC)   COPD (chronic obstructive pulmonary disease) (HCC)   Diabetic ketoacidosis with coma associated with type 2 diabetes mellitus (HCC)   Essential hypertension   Hypoxia   Obesity, Class III, BMI 40-49.9 (morbid obesity) ([place])   Acute respiratory failure with hypoxia (HCC)   Type 2 diabetes mellitus with hyperlipidemia (HCC)   Hypothyroidism  Acute hypoxic respiratory failure secondary to COVID-19 pneumonia. Patient was saturating well on 1 L, able to maintain saturation without oxygen when seen today.  Elevated inflammatory markers which now trending down. -Completed remdesivir. -Continue prednisone for another day. -Continue with supportive care and supplements. -Continue with supplemental oxygen-wean as tolerated. -PT is recommending SNF placement-no bed offer yet.  Essential hypertension. Blood pressure mildly elevated.  Order from outside -Continue amlodipine at 10 mg daily. -Continue losartan. -Continue home dose of HCTZ . -Continue PRN hydralazine.  Hypothyroidism. -Continue home dose of Synthroid.  Type 2 diabetes mellitus. CBG improved. Patient is on steroid. -Add 10 units of  levemir. -Continue with sliding scale and mealtime coverage.  AKI.  Resolved -Encourage p.o. hydration -Continue to monitor. -Avoid nephrotoxins.  History of depression. No acute concern. -Continue home dose of Paxil.  Morbid obesity. Body mass index is 49.38 kg/m.  This will complicate overall prognosis.  Physical deconditioning. -PT is recommending SNF placement.-No bed offer yet.  Objective: Vitals:   02/06/20 0422 02/06/20 0708 02/06/20 0742 02/06/20 1149  BP: (!) 160/81  (!) 178/85 (!) 153/73  Pulse: 63  66 70  Resp: 20  16 20   Temp: 97.8 F (36.6 C)  98 F (36.7 C) 98.2 F (36.8 C)  TempSrc: Oral  Oral Oral  SpO2: 96% 93% 95% 96%  Weight:      Height:       No intake or output data in the 24 hours ending 02/06/20 1414 Filed Weights   01/30/20 1648  Weight: 122.5 kg    Examination:  General.  Well-developed elderly lady, in no acute distress. Pulmonary.  Lungs clear bilaterally, normal respiratory effort. CV.  Regular rate and rhythm, no JVD, rub or murmur. Abdomen.  Soft, nontender, nondistended, BS positive. CNS.  Alert and oriented x3.  No focal neurologic deficit. Extremities.  No edema, no cyanosis, pulses intact and symmetrical. Psychiatry.  Judgment and insight appears normal.  DVT prophylaxis: Lovenox Code Status: DNR Family Communication: No response from her daughter.  TOC is trying to locate her.  Patient does not remember where she lives. Disposition Plan:  Status is: Inpatient  Remains inpatient appropriate because:Inpatient level of care appropriate due to severity of illness   Dispo: The patient is from: Home              Anticipated d/c is to: SNF  Anticipated d/c date is: 1-2 days.              Patient currently medically stable for discharge.  Original plan was to send her home . Unable to reach daughter.  TOC is working on it. Difficulty finding SNF due to Covid and insurance issues.  Unsafe discharge plan at this  time.  Consultants:   None  Procedures:  Antimicrobials:   Data Reviewed: I have personally reviewed following labs and imaging studies  CBC: Recent Labs  Lab 02/01/20 0442 02/02/20 0544 02/03/20 0422 02/04/20 0716 02/05/20 0538  WBC 8.3 8.4 8.4 11.6* 10.6*  NEUTROABS 7.1 7.2 7.3 8.2* 7.5  HGB 10.1* 10.6* 10.5* 10.6* 10.4*  HCT 29.2* 31.3* 30.5* 31.6* 30.9*  MCV 96.4 97.8 98.4 96.6 96.9  PLT 295 331 349 360 810   Basic Metabolic Panel: Recent Labs  Lab 02/01/20 0442 02/02/20 0544 02/03/20 0422 02/04/20 0716 02/05/20 0538  NA 137 141 138 141 140  K 4.1 4.5 4.4 4.5 4.7  CL 101 103 100 101 102  CO2 24 28 27  32 30  GLUCOSE 180* 184* 239* 100* 95  BUN 54* 49* 44* 39* 37*  CREATININE 1.24* 1.09* 1.02* 0.92 0.99  CALCIUM 8.3* 8.5* 8.4* 8.5* 8.5*   GFR: Estimated Creatinine Clearance: 66 mL/min (by C-G formula based on SCr of 0.99 mg/dL). Liver Function Tests: Recent Labs  Lab 02/01/20 0442 02/02/20 0544 02/03/20 0422 02/04/20 0716 02/05/20 0538  AST 20 24 26 18 17   ALT 30 30 31 26 25   ALKPHOS 56 50 50 46 42  BILITOT 0.9 0.6 0.8 0.7 0.6  PROT 6.4* 6.2* 6.1* 5.8* 5.7*  ALBUMIN 3.0* 2.8* 2.9* 2.8* 2.8*   No results for input(s): LIPASE, AMYLASE in the last 168 hours. No results for input(s): AMMONIA in the last 168 hours. Coagulation Profile: No results for input(s): INR, PROTIME in the last 168 hours. Cardiac Enzymes: No results for input(s): CKTOTAL, CKMB, CKMBINDEX, TROPONINI in the last 168 hours. BNP (last 3 results) No results for input(s): PROBNP in the last 8760 hours. HbA1C: No results for input(s): HGBA1C in the last 72 hours. CBG: Recent Labs  Lab 02/05/20 1637 02/05/20 2118 02/06/20 0814 02/06/20 1130 02/06/20 1147  GLUCAP 170* 168* 67* 110* 115*   Lipid Profile: No results for input(s): CHOL, HDL, LDLCALC, TRIG, CHOLHDL, LDLDIRECT in the last 72 hours. Thyroid Function Tests: No results for input(s): TSH, T4TOTAL, FREET4, T3FREE,  THYROIDAB in the last 72 hours. Anemia Panel: Recent Labs    02/04/20 0716 02/05/20 0538  FERRITIN 360* 311*   Sepsis Labs: Recent Labs  Lab 01/31/20 0154  PROCALCITON 0.15    Recent Results (from the past 240 hour(s))  SARS Coronavirus 2 by RT PCR (hospital order, performed in [facility] hospital lab) Nasopharyngeal Nasopharyngeal Swab     Status: Abnormal   Collection Time: 01/31/20 12:25 AM   Specimen: Nasopharyngeal Swab  Result Value Ref Range Status   SARS Coronavirus 2 POSITIVE (A) NEGATIVE Final    Comment: RESULT CALLED TO, READ BACK BY AND VERIFIED WITH:  ANNA JASPER AT 0803 01/31/20 SDR (NOTE) SARS-CoV-2 target nucleic acids are DETECTED  SARS-CoV-2 RNA is generally detectable in upper respiratory specimens  during the acute phase of infection.  Positive results are indicative  of the presence of the identified virus, but do not rule out bacterial infection or co-infection with other pathogens not detected by the test.  Clinical correlation with patient history and  other diagnostic  information is necessary to determine patient infection status.  The expected result is negative.  Fact Sheet for Patients:   StrictlyIdeas.no   Fact Sheet for Healthcare Providers:   BankingDealers.co.za    This test is not yet approved or cleared by the [place] FDA and  has been authorized for detection and/or diagnosis of SARS-CoV-2 by FDA under an Emergency Use Authorization (EUA).  This EUA will remain in effect (meaning this test  can be used) for the duration of  the COVID-19 declaration under Section 564(b)(1) of the Act, 21 U.S.C. section 360-bbb-3(b)(1), unless the authorization is terminated or revoked sooner.  Performed at [facility], [address], [place], [place] [address]      Radiology Studies: No results found.  Scheduled Meds:  albuterol  2 puff Inhalation QID   amLODipine  10 mg Oral  Daily   aspirin EC  81 mg Oral Daily   atorvastatin  20 mg Oral Daily   enoxaparin (LOVENOX) injection  40 mg Subcutaneous BID   hydrochlorothiazide  25 mg Oral Daily   insulin aspart  0-15 Units Subcutaneous TID WC   insulin aspart  0-5 Units Subcutaneous QHS   insulin detemir  5 Units Subcutaneous QHS   levothyroxine  200 mcg Oral QAC breakfast   levothyroxine  75 mcg Oral QAC breakfast   losartan  100 mg Oral Daily   metFORMIN  1,000 mg Oral BID WC   mometasone-formoterol  2 puff Inhalation BID   nicotine  14 mg Transdermal Daily   PARoxetine  60 mg Oral Daily   predniSONE  50 mg Oral Daily   Continuous Infusions:  sodium chloride Stopped (02/03/20 1051)     LOS: 6 days   Time spent: 25 minutes.  Lorella Nimrod, MD [facility]  If 7PM-7AM, please contact night-coverage [email or web address]  02/06/2020, 2:14 PM   This record has been created using Systems analyst. Errors have been sought and corrected,but may not always be located. Such creation errors do not reflect on the standard of care.

## 2020-02-06 NOTE — Care Management Important Message (Signed)
Important Message  Patient Details  Name: Glenda Boone MRN: [id number] Date of Birth: Nov 20, 1950   Medicare Important Message Given:  Other (see comment)  Attempted to review Medicare IM with patient via room phone due to isolation status, however no answer at this time.    Dannette Barbara 02/06/2020, 3:56 PM

## 2020-02-07 LAB — GLUCOSE, CAPILLARY
Glucose-Capillary: 76 mg/dL (ref 70–99)
Glucose-Capillary: 136 mg/dL — ABNORMAL HIGH (ref 70–99)
Glucose-Capillary: 203 mg/dL — ABNORMAL HIGH (ref 70–99)
Glucose-Capillary: 138 mg/dL — ABNORMAL HIGH (ref 70–99)

## 2020-02-07 LAB — CREATININE, SERUM
Creatinine, Ser: 1.07 mg/dL — ABNORMAL HIGH (ref 0.44–1.00)
GFR calc Af Amer: >60 mL/min (ref >60)
GFR calc non Af Amer: 53 mL/min — ABNORMAL LOW (ref >60)

## 2020-02-07 NOTE — TOC Progression Note (Signed)
Transition of Care [facility]) - Progression Note    Patient Details  Name: Glenda Boone MRN: [id number] Date of Birth: [birth year]  [facility]) CM/SW Contact  Shelbie Hutching, RN Phone Number: 02/07/2020, 4:12 PM  Clinical Narrative:    Raymond Gurney with DSS APS would like to meet with patient tomorrow with a face time visit.  RNCM will try to arranged this with the unit.   Expected Discharge Plan: Home/Self Care Barriers to Discharge: Continued Medical Work up  Expected Discharge Plan and Services Expected Discharge Plan: Home/Self Care   Discharge Planning Services: CM Consult Post Acute Care Choice: NA Living arrangements for the past 2 months: Apartment                 DME Arranged: Walker rolling DME Agency: [employer] Date DME Agency Contacted: 02/02/20 Time DME Agency Contacted: [id number] Representative spoke with at DME Agency: Mardene Celeste HH Arranged: Refused [place]           Social Determinants of Health ([place]) Interventions    Readmission Risk Interventions Readmission Risk Prevention Plan 02/02/2020  Transportation Screening Complete  PCP or Specialist Appt within 3-5 Days Complete  HRI or [place] Complete  Social Work Consult for [place] Planning/Counseling Complete  Palliative Care Screening Not Applicable  Medication Review [employer]) Complete  Some recent data might be hidden

## 2020-02-07 NOTE — Progress Notes (Signed)
PROGRESS NOTE    Glenda Boone  YNW:[id number] DOB: 28-Aug-1950 DOA: 01/30/2020 PCP: [facility] [place]   Brief Narrative: Taken from [place] is a 69 y.o. female with medical history significant for COPD, diabetes mellitus, hypertension, morbid obesity, depression/anxiety, hypothyroidism who presents by EMS for shortness of breath and cough.  Tested positive with Covid on 01/23/20. Patient is unvaccinated. Admitted with acute respiratory failure secondary to COVID-19 pneumonia.  Subjective: Patient has no new complaint today.  Patient is technically homeless, unable to track her daughter.  TOC has involved DSS.  Assessment & Plan:   Principal Problem:   Pneumonia due to COVID-19 virus Active Problems:   Depression, major, recurrent, moderate (HCC)   COPD (chronic obstructive pulmonary disease) (HCC)   Diabetic ketoacidosis with coma associated with type 2 diabetes mellitus (HCC)   Essential hypertension   Hypoxia   Obesity, Class III, BMI 40-49.9 (morbid obesity) ([place])   Acute respiratory failure with hypoxia (HCC)   Type 2 diabetes mellitus with hyperlipidemia (HCC)   Hypothyroidism  Acute hypoxic respiratory failure secondary to COVID-19 pneumonia. Patient was saturating well on RA now. Elevated inflammatory markers which now trending down. -Completed remdesivir. -Discontinue steroid. -Continue with supportive care and supplements. -Continue with supplemental oxygen-wean as tolerated. -PT is recommending SNF placement-no bed offer yet.  Essential hypertension. Blood pressure mildly elevated.  Order from outside -Continue amlodipine at 10 mg daily. -Continue losartan. -Continue home dose of HCTZ . -Continue PRN hydralazine.  Hypothyroidism. -Continue home dose of Synthroid.  Type 2 diabetes mellitus. CBG improved. Patient is on steroid. -Add 10 units of levemir. -Continue with sliding scale and mealtime coverage.  AKI.   Resolved -Encourage p.o. hydration -Continue to monitor. -Avoid nephrotoxins.  History of depression. No acute concern. -Continue home dose of Paxil.  Morbid obesity. Body mass index is 49.38 kg/m.  This will complicate overall prognosis.  Physical deconditioning. -PT is recommending SNF placement.-No bed offer yet.  Objective: Vitals:   02/07/20 0625 02/07/20 0716 02/07/20 0757 02/07/20 1139  BP:   (!) 152/69 (!) 168/75  Pulse: 63  60 84  Resp:   13 16  Temp:   98.4 F (36.9 C) 97.9 F (36.6 C)  TempSrc:   Oral Oral  SpO2: 96% 95% 96% 94%  Weight:      Height:        Intake/Output Summary (Last 24 hours) at 02/07/2020 1500 Last data filed at 02/07/2020 1300 Gross per 24 hour  Intake 720 ml  Output --  Net 720 ml   Filed Weights   01/30/20 1648  Weight: 122.5 kg    Examination:  General.  Well-developed elderly lady, in no acute distress. Pulmonary.  Lungs clear bilaterally, normal respiratory effort. CV.  Regular rate and rhythm, no JVD, rub or murmur. Abdomen.  Soft, nontender, nondistended, BS positive. CNS.  Alert and oriented x3.  No focal neurologic deficit. Extremities.  No edema, no cyanosis, pulses intact and symmetrical. Psychiatry.  Judgment and insight appears normal.  DVT prophylaxis: Lovenox Code Status: DNR Family Communication: No response from her daughter.  TOC is trying to locate her.  Patient does not remember where she lives. Disposition Plan:  Status is: Inpatient  Remains inpatient appropriate because:Inpatient level of care appropriate due to severity of illness   Dispo: The patient is from: Home              Anticipated d/c is to: SNF  Anticipated d/c date is: 1-2 days.              Patient currently medically stable for discharge.  Original plan was to send her home . Unable to reach daughter.  TOC is working on it. Difficulty finding SNF due to Covid and insurance issues.  Unsafe discharge plan at this  time.  Consultants:   None  Procedures:  Antimicrobials:   Data Reviewed: I have personally reviewed following labs and imaging studies  CBC: Recent Labs  Lab 02/01/20 0442 02/02/20 0544 02/03/20 0422 02/04/20 0716 02/05/20 0538  WBC 8.3 8.4 8.4 11.6* 10.6*  NEUTROABS 7.1 7.2 7.3 8.2* 7.5  HGB 10.1* 10.6* 10.5* 10.6* 10.4*  HCT 29.2* 31.3* 30.5* 31.6* 30.9*  MCV 96.4 97.8 98.4 96.6 96.9  PLT 295 331 349 360 010   Basic Metabolic Panel: Recent Labs  Lab 02/01/20 0442 02/01/20 0442 02/02/20 0544 02/03/20 0422 02/04/20 0716 02/05/20 0538 02/07/20 0450  NA 137  --  141 138 141 140  --   K 4.1  --  4.5 4.4 4.5 4.7  --   CL 101  --  103 100 101 102  --   CO2 24  --  28 27 32 30  --   GLUCOSE 180*  --  184* 239* 100* 95  --   BUN 54*  --  49* 44* 39* 37*  --   CREATININE 1.24*   < > 1.09* 1.02* 0.92 0.99 1.07*  CALCIUM 8.3*  --  8.5* 8.4* 8.5* 8.5*  --    < > = values in this interval not displayed.   GFR: Estimated Creatinine Clearance: 61.1 mL/min (A) (by C-G formula based on SCr of 1.07 mg/dL (H)). Liver Function Tests: Recent Labs  Lab 02/01/20 0442 02/02/20 0544 02/03/20 0422 02/04/20 0716 02/05/20 0538  AST 20 24 26 18 17   ALT 30 30 31 26 25   ALKPHOS 56 50 50 46 42  BILITOT 0.9 0.6 0.8 0.7 0.6  PROT 6.4* 6.2* 6.1* 5.8* 5.7*  ALBUMIN 3.0* 2.8* 2.9* 2.8* 2.8*   No results for input(s): LIPASE, AMYLASE in the last 168 hours. No results for input(s): AMMONIA in the last 168 hours. Coagulation Profile: No results for input(s): INR, PROTIME in the last 168 hours. Cardiac Enzymes: No results for input(s): CKTOTAL, CKMB, CKMBINDEX, TROPONINI in the last 168 hours. BNP (last 3 results) No results for input(s): PROBNP in the last 8760 hours. HbA1C: No results for input(s): HGBA1C in the last 72 hours. CBG: Recent Labs  Lab 02/06/20 1147 02/06/20 1550 02/06/20 2205 02/07/20 0748 02/07/20 1155  GLUCAP 115* 187* 175* 76 136*   Lipid Profile: No  results for input(s): CHOL, HDL, LDLCALC, TRIG, CHOLHDL, LDLDIRECT in the last 72 hours. Thyroid Function Tests: No results for input(s): TSH, T4TOTAL, FREET4, T3FREE, THYROIDAB in the last 72 hours. Anemia Panel: Recent Labs    02/05/20 0538  FERRITIN 311*   Sepsis Labs: No results for input(s): PROCALCITON, LATICACIDVEN in the last 168 hours.  Recent Results (from the past 240 hour(s))  SARS Coronavirus 2 by RT PCR (hospital order, performed in [facility] hospital lab) Nasopharyngeal Nasopharyngeal Swab     Status: Abnormal   Collection Time: 01/31/20 12:25 AM   Specimen: Nasopharyngeal Swab  Result Value Ref Range Status   SARS Coronavirus 2 POSITIVE (A) NEGATIVE Final    Comment: RESULT CALLED TO, READ BACK BY AND VERIFIED WITH:  ANNA JASPER AT 0803 01/31/20 SDR (NOTE) SARS-CoV-2 target  nucleic acids are DETECTED  SARS-CoV-2 RNA is generally detectable in upper respiratory specimens  during the acute phase of infection.  Positive results are indicative  of the presence of the identified virus, but do not rule out bacterial infection or co-infection with other pathogens not detected by the test.  Clinical correlation with patient history and  other diagnostic information is necessary to determine patient infection status.  The expected result is negative.  Fact Sheet for Patients:   StrictlyIdeas.no   Fact Sheet for Healthcare Providers:   BankingDealers.co.za    This test is not yet approved or cleared by the [place] FDA and  has been authorized for detection and/or diagnosis of SARS-CoV-2 by FDA under an Emergency Use Authorization (EUA).  This EUA will remain in effect (meaning this test  can be used) for the duration of  the COVID-19 declaration under Section 564(b)(1) of the Act, 21 U.S.C. section 360-bbb-3(b)(1), unless the authorization is terminated or revoked sooner.  Performed at [facility], [address] [place], [place] [address]      Radiology Studies: No results found.  Scheduled Meds: . albuterol  2 puff Inhalation QID  . amLODipine  10 mg Oral Daily  . aspirin EC  81 mg Oral Daily  . atorvastatin  20 mg Oral Daily  . enoxaparin (LOVENOX) injection  40 mg Subcutaneous BID  . hydrochlorothiazide  25 mg Oral Daily  . insulin aspart  0-15 Units Subcutaneous TID WC  . insulin aspart  0-5 Units Subcutaneous QHS  . insulin detemir  5 Units Subcutaneous QHS  . levothyroxine  200 mcg Oral QAC breakfast  . levothyroxine  75 mcg Oral QAC breakfast  . losartan  100 mg Oral Daily  . metFORMIN  1,000 mg Oral BID WC  . mometasone-formoterol  2 puff Inhalation BID  . nicotine  14 mg Transdermal Daily  . PARoxetine  60 mg Oral Daily  . predniSONE  50 mg Oral Daily   Continuous Infusions: . sodium chloride Stopped (02/03/20 1051)     LOS: 7 days   Time spent: 20 minutes.  Lorella Nimrod, MD [facility]  If 7PM-7AM, please contact night-coverage [email or web address]  02/07/2020, 3:00 PM   This record has been created using Systems analyst. Errors have been sought and corrected,but may not always be located. Such creation errors do not reflect on the standard of care.

## 2020-02-07 NOTE — Progress Notes (Signed)
Physical Therapy Treatment Patient Details Name: Glenda Boone MRN: [id number] DOB: Aug 05, 1950 Today's Date: 02/07/2020    History of Present Illness 69 y.o. female presenting to hospital with shortness of breath, fatigue and admitted with Covid PNA.  She was her 1 month ago with AMS, sepsis.  Pt recently evicted from her home and had been living in a hotel for 2 months; moved into friend's home (with daughter as well) at that discharge.  PMH includes anxiety, COPD, DM, htn, neuropathy, ovarian CA s/p chemo, TAH, shingles, L ankle suergery, MDD.    PT Comments    Pt in bathroom upon PT arrival.  Pt able to ambulate 20 feet back from bathroom (to bed) with SPC and holding onto furniture in room (pt appearing fatigued and SOB; pt on room air).  After pt sat on edge of bed, pt sat for a few minutes trying to catch her breath (O2 sats 92% or greater on room air); vc's given for pursed lip breathing.  Pt reporting she was exhausted and needed to lay down to rest and requesting 2 L O2 via nasal cannula d/t feeling she could not catch her breath (once 2 L O2 via nasal cannula applied, decreased work of breathing noted and pt appearing more comfortable and SOB resolved--nurse notified).  Will continue to see pt for strengthening, endurance, and increasing activity tolerance with functional mobility.   Follow Up Recommendations  SNF     Equipment Recommendations  Rolling walker with 5" wheels    Recommendations for Other Services       Precautions / Restrictions Precautions Precautions: Fall Restrictions Weight Bearing Restrictions: No    Mobility  Bed Mobility Overal bed mobility: Modified Independent             General bed mobility comments: Sit to supine in bed with mild increased effort to perform on own  Transfers Overall transfer level: Modified independent Equipment used: Straight cane             General transfer comment: pt stood from toilet on own and sat down on  bed safely  Ambulation/Gait Ambulation/Gait assistance: Supervision;Min guard Gait Distance (Feet): 20 Feet Assistive device: Straight cane   Gait velocity: decreased   General Gait Details: decreased B LE step length ambulating from bathroom back to bed; pt appearing fatigued and holding onto furniture for support and cane in other hand   Stairs             Wheelchair Mobility    Modified Rankin (Stroke Patients Only)       Balance Overall balance assessment: Needs assistance Sitting-balance support: No upper extremity supported;Feet supported Sitting balance-Leahy Scale: Normal Sitting balance - Comments: steady sitting reaching outside BOS   Standing balance support: Single extremity supported Standing balance-Leahy Scale: Fair Standing balance comment: pt requiring at least single UE support for standing activities                            Cognition Arousal/Alertness: Awake/alert Behavior During Therapy: WFL for tasks assessed/performed Overall Cognitive Status: Within Functional Limits for tasks assessed                                        Exercises      General Comments   Nursing cleared pt for participation in physical therapy.  Pertinent Vitals/Pain Pain Assessment: No/denies pain  HR WFL during session.    Home Living                      Prior Function            PT Goals (current goals can now be found in the care plan section) Acute Rehab PT Goals Patient Stated Goal: get into her new home PT Goal Formulation: With patient Time For Goal Achievement: 02/14/20 Potential to Achieve Goals: Good Progress towards PT goals: Progressing toward goals    Frequency    Min 2X/week      PT Plan Current plan remains appropriate    Co-evaluation              AM-PAC PT "6 Clicks" Mobility   Outcome Measure  Help needed turning from your back to your side while in a flat bed without  using bedrails?: None Help needed moving from lying on your back to sitting on the side of a flat bed without using bedrails?: None Help needed moving to and from a bed to a chair (including a wheelchair)?: None Help needed standing up from a chair using your arms (e.g., wheelchair or bedside chair)?: None Help needed to walk in hospital room?: A Little Help needed climbing 3-5 steps with a railing? : A Lot 6 Click Score: 21    End of Session Equipment Utilized During Treatment: Oxygen;Other (comment) (0-2 L O2) Activity Tolerance: Patient limited by fatigue Patient left: in bed;with call bell/phone within reach Nurse Communication: Mobility status;Precautions;Other (comment) (pt on 2 L O2 via nasal cannula per pt request) PT Visit Diagnosis: Muscle weakness (generalized) (M62.81);Difficulty in walking, not elsewhere classified (R26.2)     Time: 1188-6773 PT Time Calculation (min) (ACUTE ONLY): 11 min  Charges:  $Therapeutic Activity: 8-22 mins                    Leitha Bleak, PT 02/07/20, 4:36 PM

## 2020-02-08 LAB — GLUCOSE, CAPILLARY
Glucose-Capillary: 80 mg/dL (ref 70–99)
Glucose-Capillary: 102 mg/dL — ABNORMAL HIGH (ref 70–99)
Glucose-Capillary: 100 mg/dL — ABNORMAL HIGH (ref 70–99)
Glucose-Capillary: 86 mg/dL (ref 70–99)

## 2020-02-08 NOTE — Progress Notes (Signed)
PROGRESS NOTE    Glenda Boone  ZOX:[id number] DOB: 13-Apr-1951 DOA: 01/30/2020 PCP: [facility] [place]   Brief Narrative: Taken from [place] is a 69 y.o. female with medical history significant for COPD, diabetes mellitus, hypertension, morbid obesity, depression/anxiety, hypothyroidism who presents by EMS for shortness of breath and cough.  Tested positive with Covid on 01/23/20. Patient is unvaccinated. Admitted with acute respiratory failure secondary to COVID-19 pneumonia.  Subjective: Patient was feeling better when seen today.  No new complaint.  She was watching some movie on her phone.  When asked she told me that her daughter is in jail and she find out last night.  Assessment & Plan:   Principal Problem:   Pneumonia due to COVID-19 virus Active Problems:   Depression, major, recurrent, moderate (HCC)   COPD (chronic obstructive pulmonary disease) (HCC)   Diabetic ketoacidosis with coma associated with type 2 diabetes mellitus (HCC)   Essential hypertension   Hypoxia   Obesity, Class III, BMI 40-49.9 (morbid obesity) ([place])   Acute respiratory failure with hypoxia (HCC)   Type 2 diabetes mellitus with hyperlipidemia (HCC)   Hypothyroidism  Acute hypoxic respiratory failure secondary to COVID-19 pneumonia. Patient was saturating well on RA now. Elevated inflammatory markers which now trending down. -Completed remdesivir and steroid. -Continue with supportive care and supplements. -PT is recommending SNF placement-no bed offer yet.  Essential hypertension. Blood pressure mildly elevated.  Order from outside -Continue amlodipine at 10 mg daily. -Continue losartan. -Continue home dose of HCTZ . -Continue PRN hydralazine.  Hypothyroidism. -Continue home dose of Synthroid.  Type 2 diabetes mellitus. CBG improved.  -Continue 10 units of levemir. -Continue with sliding scale and mealtime coverage.  AKI.  Resolved -Encourage p.o.  hydration -Continue to monitor. -Avoid nephrotoxins.  History of depression. No acute concern. -Continue home dose of Paxil.  Morbid obesity. Body mass index is 49.38 kg/m.  This will complicate overall prognosis.  Physical deconditioning. -PT is recommending SNF placement.-No bed offer yet.  Objective: Vitals:   02/08/20 0440 02/08/20 0709 02/08/20 0751 02/08/20 1133  BP: (!) 159/79  (!) 165/66 134/71  Pulse: (!) 54  63 67  Resp: 20  20 18   Temp: 98.1 F (36.7 C)  98.8 F (37.1 C) 98 F (36.7 C)  TempSrc:   Oral Oral  SpO2: 100% 99% 99% 96%  Weight:      Height:       No intake or output data in the 24 hours ending 02/08/20 1508 Filed Weights   01/30/20 1648  Weight: 122.5 kg    Examination:  General.  Well-developed, obese lady, in no acute distress. Pulmonary.  Lungs clear bilaterally, normal respiratory effort. CV.  Regular rate and rhythm, no JVD, rub or murmur. Abdomen.  Soft, nontender, nondistended, BS positive. CNS.  Alert and oriented x3.  No focal neurologic deficit. Extremities.  No edema, no cyanosis, pulses intact and symmetrical. Psychiatry.  Judgment and insight appears normal.  DVT prophylaxis: Lovenox Code Status: DNR Family Communication: Apparently daughter is in jail now, per patient she find out last night.  POC has involved DSS-going to get a face time meeting today. Disposition Plan:  Status is: Inpatient  Remains inpatient appropriate because:Inpatient level of care appropriate due to severity of illness   Dispo: The patient is from: Home              Anticipated d/c is to: SNF  Anticipated d/c date is: 1-2 days.              Patient currently medically stable for discharge.  Original plan was to send her home . Unable to reach daughter.  TOC is working on it. Difficulty finding SNF due to Covid and insurance issues.  Unsafe discharge plan at this time.  Consultants:   None  Procedures:  Antimicrobials:   Data  Reviewed: I have personally reviewed following labs and imaging studies  CBC: Recent Labs  Lab 02/02/20 0544 02/03/20 0422 02/04/20 0716 02/05/20 0538  WBC 8.4 8.4 11.6* 10.6*  NEUTROABS 7.2 7.3 8.2* 7.5  HGB 10.6* 10.5* 10.6* 10.4*  HCT 31.3* 30.5* 31.6* 30.9*  MCV 97.8 98.4 96.6 96.9  PLT 331 349 360 546   Basic Metabolic Panel: Recent Labs  Lab 02/02/20 0544 02/03/20 0422 02/04/20 0716 02/05/20 0538 02/07/20 0450  NA 141 138 141 140  --   K 4.5 4.4 4.5 4.7  --   CL 103 100 101 102  --   CO2 28 27 32 30  --   GLUCOSE 184* 239* 100* 95  --   BUN 49* 44* 39* 37*  --   CREATININE 1.09* 1.02* 0.92 0.99 1.07*  CALCIUM 8.5* 8.4* 8.5* 8.5*  --    GFR: Estimated Creatinine Clearance: 61.1 mL/min (A) (by C-G formula based on SCr of 1.07 mg/dL (H)). Liver Function Tests: Recent Labs  Lab 02/02/20 0544 02/03/20 0422 02/04/20 0716 02/05/20 0538  AST 24 26 18 17   ALT 30 31 26 25   ALKPHOS 50 50 46 42  BILITOT 0.6 0.8 0.7 0.6  PROT 6.2* 6.1* 5.8* 5.7*  ALBUMIN 2.8* 2.9* 2.8* 2.8*   No results for input(s): LIPASE, AMYLASE in the last 168 hours. No results for input(s): AMMONIA in the last 168 hours. Coagulation Profile: No results for input(s): INR, PROTIME in the last 168 hours. Cardiac Enzymes: No results for input(s): CKTOTAL, CKMB, CKMBINDEX, TROPONINI in the last 168 hours. BNP (last 3 results) No results for input(s): PROBNP in the last 8760 hours. HbA1C: No results for input(s): HGBA1C in the last 72 hours. CBG: Recent Labs  Lab 02/07/20 1155 02/07/20 1559 02/07/20 2102 02/08/20 0755 02/08/20 1136  GLUCAP 136* 203* 138* 80 102*   Lipid Profile: No results for input(s): CHOL, HDL, LDLCALC, TRIG, CHOLHDL, LDLDIRECT in the last 72 hours. Thyroid Function Tests: No results for input(s): TSH, T4TOTAL, FREET4, T3FREE, THYROIDAB in the last 72 hours. Anemia Panel: No results for input(s): VITAMINB12, FOLATE, FERRITIN, TIBC, IRON, RETICCTPCT in the last 72  hours. Sepsis Labs: No results for input(s): PROCALCITON, LATICACIDVEN in the last 168 hours.  Recent Results (from the past 240 hour(s))  SARS Coronavirus 2 by RT PCR (hospital order, performed in [facility] hospital lab) Nasopharyngeal Nasopharyngeal Swab     Status: Abnormal   Collection Time: 01/31/20 12:25 AM   Specimen: Nasopharyngeal Swab  Result Value Ref Range Status   SARS Coronavirus 2 POSITIVE (A) NEGATIVE Final    Comment: RESULT CALLED TO, READ BACK BY AND VERIFIED WITH:  ANNA JASPER AT 0803 01/31/20 SDR (NOTE) SARS-CoV-2 target nucleic acids are DETECTED  SARS-CoV-2 RNA is generally detectable in upper respiratory specimens  during the acute phase of infection.  Positive results are indicative  of the presence of the identified virus, but do not rule out bacterial infection or co-infection with other pathogens not detected by the test.  Clinical correlation with patient history and  other diagnostic  information is necessary to determine patient infection status.  The expected result is negative.  Fact Sheet for Patients:   StrictlyIdeas.no   Fact Sheet for Healthcare Providers:   BankingDealers.co.za    This test is not yet approved or cleared by the [place] FDA and  has been authorized for detection and/or diagnosis of SARS-CoV-2 by FDA under an Emergency Use Authorization (EUA).  This EUA will remain in effect (meaning this test  can be used) for the duration of  the COVID-19 declaration under Section 564(b)(1) of the Act, 21 U.S.C. section 360-bbb-3(b)(1), unless the authorization is terminated or revoked sooner.  Performed at [facility], [address], [place], [place] [address]      Radiology Studies: No results found.  Scheduled Meds:  albuterol  2 puff Inhalation QID   amLODipine  10 mg Oral Daily   aspirin EC  81 mg Oral Daily   atorvastatin  20 mg Oral Daily   enoxaparin  (LOVENOX) injection  40 mg Subcutaneous BID   hydrochlorothiazide  25 mg Oral Daily   insulin aspart  0-15 Units Subcutaneous TID WC   insulin aspart  0-5 Units Subcutaneous QHS   insulin detemir  5 Units Subcutaneous QHS   levothyroxine  200 mcg Oral QAC breakfast   levothyroxine  75 mcg Oral QAC breakfast   losartan  100 mg Oral Daily   metFORMIN  1,000 mg Oral BID WC   mometasone-formoterol  2 puff Inhalation BID   nicotine  14 mg Transdermal Daily   PARoxetine  60 mg Oral Daily   Continuous Infusions:  sodium chloride Stopped (02/03/20 1051)     LOS: 8 days   Time spent: 20 minutes.  Lorella Nimrod, MD [facility]  If 7PM-7AM, please contact night-coverage [email or web address]  02/08/2020, 3:08 PM   This record has been created using Systems analyst. Errors have been sought and corrected,but may not always be located. Such creation errors do not reflect on the standard of care.

## 2020-02-09 LAB — APTT: aPTT: 32 seconds (ref 24–36)

## 2020-02-09 LAB — CBC WITH DIFFERENTIAL/PLATELET
Abs Immature Granulocytes: 0.22 10*3/uL — ABNORMAL HIGH (ref 0.00–0.07)
Basophils Absolute: 0 10*3/uL (ref 0.0–0.1)
Basophils Relative: 0 %
Eosinophils Absolute: 0.2 10*3/uL (ref 0.0–0.5)
Eosinophils Relative: 2 %
HCT: 35.4 % — ABNORMAL LOW (ref 36.0–46.0)
Hemoglobin: 11.8 g/dL — ABNORMAL LOW (ref 12.0–15.0)
Immature Granulocytes: 2 %
Lymphocytes Relative: 13 %
Lymphs Abs: 1.6 10*3/uL (ref 0.7–4.0)
MCH: 32.6 pg (ref 26.0–34.0)
MCHC: 33.3 g/dL (ref 30.0–36.0)
MCV: 97.8 fL (ref 80.0–100.0)
Monocytes Absolute: 1.1 10*3/uL — ABNORMAL HIGH (ref 0.1–1.0)
Monocytes Relative: 9 %
Neutro Abs: 9.8 10*3/uL — ABNORMAL HIGH (ref 1.7–7.7)
Neutrophils Relative %: 74 %
Platelets: 297 10*3/uL (ref 150–400)
RBC: 3.62 MIL/uL — ABNORMAL LOW (ref 3.87–5.11)
RDW: 13.7 % (ref 11.5–15.5)
WBC: 13 10*3/uL — ABNORMAL HIGH (ref 4.0–10.5)
nRBC: 0 % (ref 0.0–0.2)

## 2020-02-09 LAB — PROTIME-INR
INR: 1.1 (ref 0.8–1.2)
Prothrombin Time: 13.3 seconds (ref 11.4–15.2)

## 2020-02-09 LAB — GLUCOSE, CAPILLARY
Glucose-Capillary: 64 mg/dL — ABNORMAL LOW (ref 70–99)
Glucose-Capillary: 84 mg/dL (ref 70–99)
Glucose-Capillary: 114 mg/dL — ABNORMAL HIGH (ref 70–99)
Glucose-Capillary: 91 mg/dL (ref 70–99)
Glucose-Capillary: 186 mg/dL — ABNORMAL HIGH (ref 70–99)
Glucose-Capillary: 149 mg/dL — ABNORMAL HIGH (ref 70–99)
Glucose-Capillary: 113 mg/dL — ABNORMAL HIGH (ref 70–99)
Glucose-Capillary: 209 mg/dL — ABNORMAL HIGH (ref 70–99)

## 2020-02-09 LAB — BASIC METABOLIC PANEL
Anion gap: 9 (ref 5–15)
BUN: 32 mg/dL — ABNORMAL HIGH (ref 8–23)
CO2: 28 mmol/L (ref 22–32)
Calcium: 8.4 mg/dL — ABNORMAL LOW (ref 8.9–10.3)
Chloride: 99 mmol/L (ref 98–111)
Creatinine, Ser: 1.16 mg/dL — ABNORMAL HIGH (ref 0.44–1.00)
GFR calc Af Amer: 55 mL/min — ABNORMAL LOW (ref >60)
GFR calc non Af Amer: 48 mL/min — ABNORMAL LOW (ref >60)
Glucose, Bld: 154 mg/dL — ABNORMAL HIGH (ref 70–99)
Potassium: 4.3 mmol/L (ref 3.5–5.1)
Sodium: 136 mmol/L (ref 135–145)

## 2020-02-09 LAB — TROPONIN I (HIGH SENSITIVITY): Troponin I (High Sensitivity): 10 ng/L (ref <18)

## 2020-02-09 MED ORDER — AMIODARONE IV BOLUS ONLY 150 MG/100ML
INTRAVENOUS | Status: AC
  Administered 2020-02-09: 150 mg
  Filled 2020-02-09: qty 100

## 2020-02-09 MED ORDER — ENSURE ENLIVE PO LIQD
237.0000 mL | Freq: Three times a day (TID) | ORAL | Status: DC
  Administered 2020-02-09 – 2020-02-14 (×11): 237 mL via ORAL

## 2020-02-09 MED ORDER — CHLORHEXIDINE GLUCONATE CLOTH 2 % EX PADS
6.0000 | MEDICATED_PAD | Freq: Every day | CUTANEOUS | Status: DC
  Administered 2020-02-09 – 2020-02-10 (×2): 6 via TOPICAL

## 2020-02-09 MED ORDER — AMIODARONE HCL IN DEXTROSE 360-4.14 MG/200ML-% IV SOLN
30.0000 mg/h | INTRAVENOUS | Status: DC
  Administered 2020-02-09: 30 mg/h via INTRAVENOUS
  Filled 2020-02-09: qty 200

## 2020-02-09 MED ORDER — AMIODARONE HCL IN DEXTROSE 360-4.14 MG/200ML-% IV SOLN
60.0000 mg/h | INTRAVENOUS | Status: DC
  Administered 2020-02-09: 60 mg/h via INTRAVENOUS
  Filled 2020-02-09: qty 200

## 2020-02-09 MED ORDER — AMIODARONE LOAD VIA INFUSION
150.0000 mg | Freq: Once | INTRAVENOUS | Status: DC
  Filled 2020-02-09: qty 83.34

## 2020-02-09 MED ORDER — HEPARIN (PORCINE) 25000 UT/250ML-% IV SOLN
1350.0000 [IU]/h | INTRAVENOUS | Status: DC
  Administered 2020-02-09: 1100 [IU]/h via INTRAVENOUS
  Filled 2020-02-09: qty 250

## 2020-02-09 MED ORDER — HEPARIN BOLUS VIA INFUSION
4000.0000 [IU] | Freq: Once | INTRAVENOUS | Status: AC
  Administered 2020-02-09: 4000 [IU] via INTRAVENOUS
  Filled 2020-02-09: qty 4000

## 2020-02-09 MED ORDER — SODIUM CHLORIDE 0.9 % IV BOLUS
500.0000 mL | Freq: Once | INTRAVENOUS | Status: AC
  Administered 2020-02-09: 15:00:00 500 mL via INTRAVENOUS

## 2020-02-09 MED ORDER — SODIUM CHLORIDE 0.9 % IV BOLUS: 1000.0000 mL | Freq: Once | INTRAVENOUS | Status: DC

## 2020-02-09 NOTE — Progress Notes (Signed)
   02/09/20 1900  Clinical Encounter Type  Visited With Patient not available;Health care provider  Visit Type Code  Referral From Nurse   This chaplain responded to a Rapid Response for this patient. Upon arrival, the medical team was assessing the patient and administering care. Chaplain maintained pastoral presence outside of the patient's room, offering silent, energetic prayer. The patient was transferred to ICU. Will continue to follow.  Gennaro Africa, Chaplain

## 2020-02-09 NOTE — Progress Notes (Signed)
Initial Nutrition Assessment  DOCUMENTATION CODES:   Morbid obesity  INTERVENTION:  Recommend liberalizing diet to carbohydrate modified.  Provide Ensure Enlive po TID, each supplement provides 350 kcal and 20 grams of protein.  Pt would benefit from nutrient dense supplement. One Ensure Enlive supplement provides 350 kcals, 20 grams protein, and 44-45 grams of carbohydrate vs one Glucerna shake supplement, which provides 220 kcals, 10 grams of protein, and 26 grams of carbohydrate. Given pt's hx of DM, RD will reassess adequacy of PO intake, CBGS, and adjust supplement regimen as appropriate at follow-up.   NUTRITION DIAGNOSIS:   Increased nutrient needs related to catabolic illness ([id number]) as evidenced by estimated needs.  GOAL:   Patient will meet greater than or equal to 90% of their needs  MONITOR:   PO intake, Supplement acceptance, Labs, Weight trends, I & O's  REASON FOR ASSESSMENT:   Malnutrition Screening Tool    ASSESSMENT:   69 year old female with PMHx of anxiety, depression, DM, ovarian cancer, COPD, HTN, HLD, GERD, hypothyroidism admitted with COVID-19 PNA, AKI.   Attempted to call patient over the phone this AM but she was unable to answer. Attempted to stop by patient's room this afternoon when on 1C but rapid response had been called on patient and multiple RNs in room assisting patient. Unable to obtain nutrition/weight history at this time. Per review of chart patient was eating 100% of her meals up until 9/15. She ate 30% of breakfast on 9/15 and 10% of her breakfast today. Patient would benefit from oral nutrition supplements to help meet calorie/protein needs. Patient being transferred to step-down unit.  Per review of chart patient was 136.7 kg on 09/04/2019. She is now documented to be 122.5 kg (270 lbs) but this appears to be a stated/estimated weight and not truly measured. If weights are accurate that is a weight loss of 14.2 kg (10.4% body weight)  over 5 months, which would be significant for time frame.  Medications reviewed and include: Novolog 0-15 units TID, Novolog 0-5 units QHS, Levemir 5 units QHS, levothyroxine, metformin 1000 mg BID, nicotine patch.  Labs reviewed: CBG 64-102.  Patient is at risk for malnutrition.  NUTRITION - FOCUSED PHYSICAL EXAM:  Unable to complete at this time.  Diet Order:   Diet Order            Diet heart healthy/carb modified Room service appropriate? Yes; Fluid consistency: Thin  Diet effective now                EDUCATION NEEDS:   No education needs have been identified at this time  Skin:  Skin Assessment: Reviewed RN Assessment  Last BM:  02/07/2020 per chart  Height:   Ht Readings from Last 1 Encounters:  01/30/20 5\' 2"  (1.575 m)   Weight:   Wt Readings from Last 1 Encounters:  01/30/20 122.5 kg   BMI:  Body mass index is 49.38 kg/m.  Estimated Nutritional Needs:   Kcal:  2300-2500  Protein:  120 grams  Fluid:  >/= 2 L/day  Jacklynn Barnacle, MS, RD, LDN Pager number available on Amion

## 2020-02-09 NOTE — Progress Notes (Signed)
PROGRESS NOTE    Glenda Boone  RXV:[id number] DOB: [birth year] DOA: 01/30/2020 PCP: [facility]   Brief Narrative: Taken from [place] is a 69 y.o. female with medical history significant for COPD, diabetes mellitus, hypertension, morbid obesity, depression/anxiety, hypothyroidism who presents by EMS for shortness of breath and cough.  Tested positive with Covid on 01/23/20. Patient is unvaccinated. Admitted with acute respiratory failure secondary to COVID-19 pneumonia.  Subjective: Patient was feeling better when seen during morning rounds.  She was able to ambulate without any significant desaturation.  Waiting for SNF placement.  Paged by nursing staff around 2 PM the patient was complaining of being dizzy and becoming tachycardic.  EKG obtained which shows A. fib with RVR.  Patient was quite symptomatic with dizziness, chest discomfort and palpitations.  She was hypotensive with systolic in low 50D.  Assessment & Plan:   Principal Problem:   Pneumonia due to COVID-19 virus Active Problems:   Depression, major, recurrent, moderate (HCC)   COPD (chronic obstructive pulmonary disease) (HCC)   Diabetic ketoacidosis with coma associated with type 2 diabetes mellitus (HCC)   Essential hypertension   Hypoxia   Obesity, Class III, BMI 40-49.9 (morbid obesity) ([place])   Acute respiratory failure with hypoxia (HCC)   Type 2 diabetes mellitus with hyperlipidemia (HCC)   Hypothyroidism   New onset symptomatic A. fib with RVR.  Sudden onset new A. fib with RVR.  Patient becomes quite symptomatic with hypotension, dizziness, having some chest discomfort and palpitations.  Blood pressure in low 70s. Rapid response was called by nursing staff. -Checking basic labs include CBC, troponin and BMP. -Normal saline bolus start with 500 cc which can be increased to 1 L if needed. -Cardiology consult-we will appreciate their help.  Can use amiodarone as it might not  affect her current hypertension very significantly.  Dr. Ubaldo Glassing from [place] [facility] was contacted. -Transfer her to stepdown.  Acute hypoxic respiratory failure secondary to COVID-19 pneumonia. Patient was saturating well on RA now. Elevated inflammatory markers which now trending down. -Completed remdesivir and steroid. -Continue with supportive care and supplements. -PT is recommending SNF placement-no bed offer yet.  Essential hypertension. Blood pressure mildly elevated in the morning and she did received all her antihypertensives.  Later developed symptomatic A. fib with RVR and became hypotensive. -Holding antihypertensives till she improves. -Home dose of amlodipine, HCTZ and losartan can be restarted once stable and blood pressure improves. -Continue PRN hydralazine.  Hypothyroidism. -Continue home dose of Synthroid.  Type 2 diabetes mellitus. CBG improved.  -Continue 10 units of levemir. -Continue with sliding scale and mealtime coverage.  AKI.  Resolved -Encourage p.o. hydration -Continue to monitor. -Avoid nephrotoxins.  History of depression. No acute concern. -Continue home dose of Paxil.  Morbid obesity. Body mass index is 49.38 kg/m.  This will complicate overall prognosis.  Physical deconditioning. -PT is recommending SNF placement.-No bed offer yet.  Objective: Vitals:   02/09/20 0753 02/09/20 1138 02/09/20 1430 02/09/20 1440  BP: 119/72 116/71 91/70 (!) 72/48  Pulse: 60 (!) 102 (!) 144 (!) 118  Resp: 20 20    Temp: 98.6 F (37 C) 97.6 F (36.4 C)    TempSrc:  Oral    SpO2: 93% 93% 95% 96%  Weight:      Height:        Intake/Output Summary (Last 24 hours) at 02/09/2020 1455 Last data filed at 02/09/2020 1100 Gross per 24 hour  Intake 10 ml  Output --  Net 10 ml   Filed Weights   01/30/20 1648  Weight: 122.5 kg    Examination:  General.  Well-developed, obese lady, in no acute distress. Pulmonary.  Lungs clear bilaterally, normal  respiratory effort. CV.  Regular rate and rhythm, no JVD, rub or murmur. Abdomen.  Soft, nontender, nondistended, BS positive. CNS.  Alert and oriented x3.  No focal neurologic deficit. Extremities.  No edema, no cyanosis, pulses intact and symmetrical. Psychiatry.  Judgment and insight appears normal.  DVT prophylaxis: Lovenox Code Status: DNR Family Communication: Apparently daughter is in jail now, per patient.  Multiple failed attempts to contact the daughter during initial hospitalization. Yolanda Bonine was updated today by nursing staff due to change in her status. Disposition Plan:  Status is: Inpatient  Remains inpatient appropriate because:Inpatient level of care appropriate due to severity of illness   Dispo: The patient is from: Home              Anticipated d/c is to: SNF              Anticipated d/c date is: 1-2 days.              Patient currently medically stable for discharge.  Original plan was to send her home . Unable to reach daughter.  TOC is working on it. Difficulty finding SNF due to Covid and insurance issues.  Unsafe discharge plan at this time.  Consultants:   Cardiology.  Procedures:  Antimicrobials:   Data Reviewed: I have personally reviewed following labs and imaging studies  CBC: Recent Labs  Lab 02/03/20 0422 02/04/20 0716 02/05/20 0538  WBC 8.4 11.6* 10.6*  NEUTROABS 7.3 8.2* 7.5  HGB 10.5* 10.6* 10.4*  HCT 30.5* 31.6* 30.9*  MCV 98.4 96.6 96.9  PLT 349 360 235   Basic Metabolic Panel: Recent Labs  Lab 02/03/20 0422 02/04/20 0716 02/05/20 0538 02/07/20 0450  NA 138 141 140  --   K 4.4 4.5 4.7  --   CL 100 101 102  --   CO2 27 32 30  --   GLUCOSE 239* 100* 95  --   BUN 44* 39* 37*  --   CREATININE 1.02* 0.92 0.99 1.07*  CALCIUM 8.4* 8.5* 8.5*  --    GFR: Estimated Creatinine Clearance: 61.1 mL/min (A) (by C-G formula based on SCr of 1.07 mg/dL (H)). Liver Function Tests: Recent Labs  Lab 02/03/20 0422 02/04/20 0716  02/05/20 0538  AST 26 18 17   ALT 31 26 25   ALKPHOS 50 46 42  BILITOT 0.8 0.7 0.6  PROT 6.1* 5.8* 5.7*  ALBUMIN 2.9* 2.8* 2.8*   No results for input(s): LIPASE, AMYLASE in the last 168 hours. No results for input(s): AMMONIA in the last 168 hours. Coagulation Profile: No results for input(s): INR, PROTIME in the last 168 hours. Cardiac Enzymes: No results for input(s): CKTOTAL, CKMB, CKMBINDEX, TROPONINI in the last 168 hours. BNP (last 3 results) No results for input(s): PROBNP in the last 8760 hours. HbA1C: No results for input(s): HGBA1C in the last 72 hours. CBG: Recent Labs  Lab 02/09/20 0802 02/09/20 0841 02/09/20 0947 02/09/20 1223 02/09/20 1425  GLUCAP 64* 84 114* 91 186*   Lipid Profile: No results for input(s): CHOL, HDL, LDLCALC, TRIG, CHOLHDL, LDLDIRECT in the last 72 hours. Thyroid Function Tests: No results for input(s): TSH, T4TOTAL, FREET4, T3FREE, THYROIDAB in the last 72 hours. Anemia Panel: No results for input(s): VITAMINB12, FOLATE, FERRITIN, TIBC, IRON, RETICCTPCT in  the last 72 hours. Sepsis Labs: No results for input(s): PROCALCITON, LATICACIDVEN in the last 168 hours.  Recent Results (from the past 240 hour(s))  SARS Coronavirus 2 by RT PCR (hospital order, performed in [facility] hospital lab) Nasopharyngeal Nasopharyngeal Swab     Status: Abnormal   Collection Time: 01/31/20 12:25 AM   Specimen: Nasopharyngeal Swab  Result Value Ref Range Status   SARS Coronavirus 2 POSITIVE (A) NEGATIVE Final    Comment: RESULT CALLED TO, READ BACK BY AND VERIFIED WITH:  ANNA JASPER AT 0803 01/31/20 SDR (NOTE) SARS-CoV-2 target nucleic acids are DETECTED  SARS-CoV-2 RNA is generally detectable in upper respiratory specimens  during the acute phase of infection.  Positive results are indicative  of the presence of the identified virus, but do not rule out bacterial infection or co-infection with other pathogens not detected by the test.  Clinical  correlation with patient history and  other diagnostic information is necessary to determine patient infection status.  The expected result is negative.  Fact Sheet for Patients:   StrictlyIdeas.no   Fact Sheet for Healthcare Providers:   BankingDealers.co.za    This test is not yet approved or cleared by the [place] FDA and  has been authorized for detection and/or diagnosis of SARS-CoV-2 by FDA under an Emergency Use Authorization (EUA).  This EUA will remain in effect (meaning this test  can be used) for the duration of  the COVID-19 declaration under Section 564(b)(1) of the Act, 21 U.S.C. section 360-bbb-3(b)(1), unless the authorization is terminated or revoked sooner.  Performed at [facility], [address], [place],  [address]      Radiology Studies: No results found.  Scheduled Meds: . albuterol  2 puff Inhalation QID  . amLODipine  10 mg Oral Daily  . aspirin EC  81 mg Oral Daily  . atorvastatin  20 mg Oral Daily  . enoxaparin (LOVENOX) injection  40 mg Subcutaneous BID  . hydrochlorothiazide  25 mg Oral Daily  . insulin aspart  0-15 Units Subcutaneous TID WC  . insulin aspart  0-5 Units Subcutaneous QHS  . insulin detemir  5 Units Subcutaneous QHS  . levothyroxine  200 mcg Oral QAC breakfast  . levothyroxine  75 mcg Oral QAC breakfast  . losartan  100 mg Oral Daily  . metFORMIN  1,000 mg Oral BID WC  . mometasone-formoterol  2 puff Inhalation BID  . nicotine  14 mg Transdermal Daily  . PARoxetine  60 mg Oral Daily   Continuous Infusions: . sodium chloride Stopped (02/03/20 1051)  . sodium chloride    . sodium chloride       LOS: 9 days   Time spent: 40 minutes.   Lorella Nimrod, MD [facility]  If 7PM-7AM, please contact night-coverage [email or web address]  02/09/2020, 2:55 PM   This record has been created using Systems analyst. Errors have been sought and  corrected,but may not always be located. Such creation errors do not reflect on the standard of care.

## 2020-02-09 NOTE — Progress Notes (Signed)
Inpatient Diabetes Program Recommendations  AACE/ADA: New Consensus Statement on Inpatient Glycemic Control (2015)  Target Ranges:  Prepandial:   less than 140 mg/dL      Peak postprandial:   less than 180 mg/dL (1-2 hours)      Critically ill patients:  140 - 180 mg/dL   Results for ABBYGALE, LAPID (MRN [id number]) as of 02/09/2020 11:07  Ref. Range 02/08/2020 07:55 02/08/2020 11:36 02/08/2020 16:57 02/08/2020 21:29  Glucose-Capillary Latest Ref Range: 70 - 99 mg/dL 80 102 (H) 100 (H) 86   5 units LANTUS   Results for JADALEE, WESTCOTT (MRN [id number]) as of 02/09/2020 11:07  Ref. Range 02/09/2020 08:02 02/09/2020 08:41 02/09/2020 09:47  Glucose-Capillary Latest Ref Range: 70 - 99 mg/dL 64 (L) 84 114 (H)    Admit with: Acute hypoxic respiratory failure secondary to COVID-19 pneumonia  History: DM, COPD  Home DM Meds: Metformin 1000 mg BID  Current Orders: Levemir 5 units QHS         Novolog Moderate Correction Scale/ SSI (0-15 units) TID AC + HS      Metformin 1000 mg BID    MD- Note patient with Hypoglycemia this AM (CBG 64).  Please consider d/c of Levemir    --Will follow patient during hospitalization--  Wyn Quaker RN, MSN, CDE Diabetes Coordinator Inpatient Glycemic Control Team Team Pager: [phone] (8a-5p)

## 2020-02-09 NOTE — Progress Notes (Signed)
Pt states she is afraid she will not be safe when she leaves here. She states she is scared that where she is discharges to will not take care of her. This RN provided reassurance and sat with her for a while.

## 2020-02-09 NOTE — Progress Notes (Signed)
Physical Therapy Treatment Patient Details Name: Glenda Boone MRN: [id number] DOB: February 02, 1951 Today's Date: 02/09/2020    History of Present Illness 69 y.o. female presenting to hospital with shortness of breath, fatigue and admitted with Covid PNA.  She was her 1 month ago with AMS, sepsis.  Pt recently evicted from her home and had been living in a hotel for 2 months; moved into friend's home (with daughter as well) at that discharge.  PMH includes anxiety, COPD, DM, htn, neuropathy, ovarian CA s/p chemo, TAH, shingles, L ankle suergery, MDD.    PT Comments    Pt almost to bathroom door upon PT entering room--pt reporting waking up and needing to go to the bathroom urgently (incontinence noted on floor into bathroom--therapist cleaned up floor to prevent slipping and pt given new hospital underwear to wear; nurse notified).  Pt reporting feeling weak walking from bathroom back to bed using SPC and holding onto furniture for safety.  After sitting on edge of bed to catch her breath, pt agreeable to ambulation.  Able to ambulate 40 feet with RW CGA but distance limited d/t SOB, fatigue, and generalized weakness.  O2 sats 94% or greater on room air during sessions activities.  Improved SOB and work of breathing noted with sitting rest breaks.  Pt declined further ambulation and any ex's d/t fatigue and wanting to lay down in bed to rest.  Will continue to focus on strengthening and progressive functional mobility per pt tolerance.    Follow Up Recommendations  SNF     Equipment Recommendations  Rolling walker with 5" wheels    Recommendations for Other Services       Precautions / Restrictions Precautions Precautions: Fall Restrictions Weight Bearing Restrictions: No    Mobility  Bed Mobility Overal bed mobility: Modified Independent             General bed mobility comments: Sit to semi-supine in bed with mild increased effort to perform on own  Transfers Overall transfer  level: Needs assistance Equipment used: Straight cane;Rolling walker (2 wheeled) Transfers: Sit to/from Stand;Stand Pivot Transfers Sit to Stand: Supervision Stand pivot transfers: Supervision       General transfer comment: mild increased effort to stand from toilet (with grab bar and SPC use) and to stand from bed (with RW use)  Ambulation/Gait Ambulation/Gait assistance: Min guard Gait Distance (Feet):  (10 feet with SPC (pt already partway to bathroom upon PT entering room); 20 feet (bathroom to bed) with SPC; 40 feet with RW) Assistive device: Straight cane;Rolling walker (2 wheeled)   Gait velocity: decreased   General Gait Details: decreased B LE step length; pt holding onto furniture walking to/from bathroom and use of SPC (pt reporting feeling weak walking back from bathroom to bed); limited distance ambulating with RW d/t fatigue and SOB   Stairs             Wheelchair Mobility    Modified Rankin (Stroke Patients Only)       Balance Overall balance assessment: Needs assistance Sitting-balance support: No upper extremity supported;Feet supported Sitting balance-Leahy Scale: Normal Sitting balance - Comments: steady sitting reaching outside BOS   Standing balance support: Single extremity supported Standing balance-Leahy Scale: Fair Standing balance comment: pt requiring at least single UE support for standing activities                            Cognition Arousal/Alertness: Awake/alert Behavior During Therapy: [facility]  for tasks assessed/performed Overall Cognitive Status: Within Functional Limits for tasks assessed                                        Exercises      General Comments   Nursing cleared pt for participation in physical therapy.  Pt agreeable to PT session.      Pertinent Vitals/Pain Pain Assessment: No/denies pain  HR WFL during sessions activities    Home Living                      Prior  Function            PT Goals (current goals can now be found in the care plan section) Acute Rehab PT Goals Patient Stated Goal: get into her new home PT Goal Formulation: With patient Time For Goal Achievement: 02/14/20 Potential to Achieve Goals: Good Progress towards PT goals: Progressing toward goals    Frequency    Min 2X/week      PT Plan Current plan remains appropriate    Co-evaluation              AM-PAC PT "6 Clicks" Mobility   Outcome Measure  Help needed turning from your back to your side while in a flat bed without using bedrails?: None Help needed moving from lying on your back to sitting on the side of a flat bed without using bedrails?: None Help needed moving to and from a bed to a chair (including a wheelchair)?: None Help needed standing up from a chair using your arms (e.g., wheelchair or bedside chair)?: None Help needed to walk in hospital room?: A Little Help needed climbing 3-5 steps with a railing? : A Lot 6 Click Score: 21    End of Session Equipment Utilized During Treatment: Gait belt Activity Tolerance: Patient limited by fatigue Patient left: in bed;with call bell/phone within reach Nurse Communication: Mobility status;Precautions (pt's incontinence) PT Visit Diagnosis: Muscle weakness (generalized) (M62.81);Difficulty in walking, not elsewhere classified (R26.2)     Time: 0347-4259 PT Time Calculation (min) (ACUTE ONLY): 23 min  Charges:  $Therapeutic Activity: 23-37 mins                    Leitha Bleak, PT 02/09/20, 1:03 PM

## 2020-02-09 NOTE — Progress Notes (Signed)
Called patients contact her grandson Sydnee Cabal and made him aware that she was being moved to another unit.

## 2020-02-09 NOTE — Consult Note (Signed)
ANTICOAGULATION CONSULT NOTE - Initial Consult  Pharmacy Consult for heparin Indication: atrial fibrillation  Allergies  Allergen Reactions  . Codeine   . Shellfish Allergy Hives  . Demerol [Meperidine Hcl] Anxiety  . Gabapentin Other (See Comments)    Gives nightmares    Patient Measurements: Height: 5\' 2"  (157.5 cm) Weight: 122.5 kg (270 lb) IBW/kg (Calculated) : 50.1 Heparin Dosing Weight: 80.6  Vital Signs: Temp: 97.6 F (36.4 C) (09/16 1138) Temp Source: Oral (09/16 1138) BP: 72/48 (09/16 1440) Pulse Rate: 118 (09/16 1440)  Labs: Recent Labs    02/07/20 0450 02/09/20 1502  HGB  --  11.8*  HCT  --  35.4*  PLT  --  297  CREATININE 1.07* 1.16*  TROPONINIHS  --  10    Estimated Creatinine Clearance: 56.4 mL/min (A) (by C-G formula based on SCr of 1.16 mg/dL (H)).   Medical History: Past Medical History:  Diagnosis Date  . Anxiety   . Bronchitis   . COPD (chronic obstructive pulmonary disease) ([place])   . Depression   . Diabetes mellitus, type II ([place])   . Dyspnea   . GERD (gastroesophageal reflux disease)   . Hyperlipidemia   . Hypertension   . Hypothyroidism   . Neuropathy   . Ovarian cancer ([place])   . Shingles   . Thyroid disease     Medications:  Scheduled:  . albuterol  2 puff Inhalation QID  . amiodarone  150 mg Intravenous Once  . aspirin EC  81 mg Oral Daily  . atorvastatin  20 mg Oral Daily  . Chlorhexidine Gluconate Cloth  6 each Topical Daily  . feeding supplement (ENSURE ENLIVE)  237 mL Oral TID BM  . heparin  4,000 Units Intravenous Once  . insulin aspart  0-15 Units Subcutaneous TID WC  . insulin aspart  0-5 Units Subcutaneous QHS  . insulin detemir  5 Units Subcutaneous QHS  . levothyroxine  200 mcg Oral QAC breakfast  . levothyroxine  75 mcg Oral QAC breakfast  . metFORMIN  1,000 mg Oral BID WC  . mometasone-formoterol  2 puff Inhalation BID  . nicotine  14 mg Transdermal Daily  . PARoxetine  60 mg Oral Daily     Assessment: 69 year old female presenting with shortness of breath and cough. Pt found to be COVID positive. PMH includes COPD, diabetes, HTN, depression/anxiety, and hypothyroidism. Pt started feeling weak and dizzy and was found to be in atrial fibrillation with RVR. Pt was on enoxaparin DVT prophylaxis. Pharmacy consulted to dose heparin.  Goal of Therapy:  Heparin level 0.3-0.7 units/ml Monitor platelets by anticoagulation protocol: Yes   Plan:  Discontinue enoxaparin  Give 4000 units bolus x 1 Start heparin infusion at 1100 units/hr Check anti-Xa level in 6 hours and daily while on heparin Continue to monitor H&H and platelets  Benn Moulder, PharmD Pharmacy Resident  02/09/2020 4:58 PM

## 2020-02-09 NOTE — Progress Notes (Signed)
Rapid Response Event Note   Reason for Call : Elevated heart rate and low blood pressure   Initial Focused Assessment:  Patient alert and oriented.  Denies chest pain.  EKG completed before rapid response nurse arrival.  EKG read a-fib with rvr.  Patient 95% on room air.  Afebrile. Hypotensive.     Interventions: MD paged.  Order obtained for cbc, metb, troponin, and transfer to ICU stepdown.  Cardiology consult placed by MD   Plan of Care: Patient transferred to ICU for amiodarone drip.     Event Summary:   MD Notified: 14:20 Call Time:14:21 Arrival Time:14:30 End Time:15:22  Silver Huguenin, RN

## 2020-02-09 NOTE — Progress Notes (Signed)
Patients blood sugar was 64 gave orange juice rechecked and then it was 84 , then patient ate brteakfast and she said she felt like her sugar was still low we rechecked and it was 114. Messaged MD, to ask  If patient should take her morning metformin, the patient was worried about taking it. MD stated to hold morning dose.

## 2020-02-09 NOTE — Consult Note (Signed)
Cardiology Consultation Note    Patient ID: Glenda Boone, MRN: [id number], DOB/AGE: 1950-09-23 69 y.o. Admit date: 01/30/2020   Date of Consult: 02/09/2020 Primary Physician: [facility] Primary Cardiologist: none  Chief Complaint: sob/covid pneumonia Reason for Consultation: afib/hypotension Requesting MD: Dr. Reesa Chew  HPI: Glenda Boone is a 69 y.o. female with history of morbid obesity, diabetes, COPD, depression anxiety who presented to the ER on 2020-01-31 with complaints of shortness of breath and a cough.  She tested positive for Covid 19 on 2020-01-23.  She was at a facility called [facility] for depression.  She was released home on Friday and felt worse over the weekend.  Loss sensation of taste and smell.  Was unvaccinated.  Continues to smoke approximately 1 pack of cigarettes a day.  Presented to emergency room where she was noted to be hypoxic.  Chest x-ray showed bilateral Covid pneumonia.  She was started on remdesivir and Solu-Medrol per Covid protocol.  She developed hypotension and A. fib with RVR and was transferred to the ICU.  She remains in A. fib with RVR.  Blood pressure is improved with IV fluids.  She complains of shortness of breath but no chest pain.  Troponin was ordered which was normal.  Potassium was normal, creatinine was 1.16 up from 1 normal baseline suggesting volume depletion.  Hemoglobin 11.8.  White count 13.0 although the patient has received Solu-Medrol. Past Medical History:  Diagnosis Date  . Anxiety   . Bronchitis   . COPD (chronic obstructive pulmonary disease) ([place])   . Depression   . Diabetes mellitus, type II ([place])   . Dyspnea   . GERD (gastroesophageal reflux disease)   . Hyperlipidemia   . Hypertension   . Hypothyroidism   . Neuropathy   . Ovarian cancer ([place])   . Shingles   . Thyroid disease       Surgical History:  Past Surgical History:  Procedure Laterality Date  . ABDOMINAL HYSTERECTOMY    . ANKLE  SURGERY Left   . CHOLECYSTECTOMY    . OVARY SURGERY    . SMALL INTESTINE SURGERY    . TONSILLECTOMY       Home Meds: Prior to Admission medications   Medication Sig Start Date End Date Taking? Authorizing Provider  hydrochlorothiazide (HYDRODIURIL) 25 MG tablet Take 25 mg by mouth daily.  06/30/17  Yes [provider]  levothyroxine (SYNTHROID) 75 MCG tablet Take 75 mcg by mouth daily before breakfast. (take with 265mcg tablet to total 258mcg daily)   Yes [provider]  levothyroxine (SYNTHROID, LEVOTHROID) 200 MCG tablet Take 200 mcg by mouth daily before breakfast. (take with 3mcg tablet to equal total of 283mcg daily)   Yes [provider]  losartan (COZAAR) 100 MG tablet Take 100 mg by mouth daily. for high blood pressure 01/09/18  Yes [provider]  metFORMIN (GLUCOPHAGE-XR) 500 MG 24 hr tablet TAKE 2 TABLETS BY MOUTH TWICE DAILY FOR DIABETES   Yes [provider]  mometasone-formoterol (DULERA) 200-5 MCG/ACT AERO Inhale 2 puffs into the lungs 2 (two) times daily. 01/01/20  Yes Mercy Riding, MD  PARoxetine (PAXIL) 20 MG tablet Take 3 tablets (60 mg total) by mouth daily. 04/25/19  Yes [name], MD  simvastatin (ZOCOR) 40 MG tablet Take 40 mg by mouth daily.   Yes [provider]  ADVAIR DISKUS 250-50 MCG/DOSE AEPB Inhale 1 puff into the lungs 2 (two) times daily. 12/31/19   [provider]  albuterol (VENTOLIN HFA) 108 (90 Base) MCG/ACT inhaler Inhale 2 puffs into the lungs every 6 (six) hours as needed for wheezing or shortness of breath. Patient not taking: Reported on 01/31/2020 01/01/20   Mercy Riding, MD  doxycycline (VIBRA-TABS) 100 MG tablet Take 1 tablet (100 mg total) by mouth every 12 (twelve) hours. Patient not taking: Reported on 01/31/2020 01/01/20   Mercy Riding, MD    Inpatient Medications:  . albuterol  2 puff Inhalation QID  . amLODipine  10 mg Oral Daily  . aspirin EC  81 mg Oral Daily  . atorvastatin  20  mg Oral Daily  . Chlorhexidine Gluconate Cloth  6 each Topical Daily  . enoxaparin (LOVENOX) injection  40 mg Subcutaneous BID  . hydrochlorothiazide  25 mg Oral Daily  . insulin aspart  0-15 Units Subcutaneous TID WC  . insulin aspart  0-5 Units Subcutaneous QHS  . insulin detemir  5 Units Subcutaneous QHS  . levothyroxine  200 mcg Oral QAC breakfast  . levothyroxine  75 mcg Oral QAC breakfast  . losartan  100 mg Oral Daily  . metFORMIN  1,000 mg Oral BID WC  . mometasone-formoterol  2 puff Inhalation BID  . nicotine  14 mg Transdermal Daily  . PARoxetine  60 mg Oral Daily   . sodium chloride Stopped (02/03/20 1051)    Allergies:  Allergies  Allergen Reactions  . Codeine   . Shellfish Allergy Hives  . Demerol [Meperidine Hcl] Anxiety  . Gabapentin Other (See Comments)    Gives nightmares    Social History   Socioeconomic History  . Marital status: Widowed    Spouse name: Not on file  . Number of children: Not on file  . Years of education: Not on file  . Highest education level: Not on file  Occupational History  . Not on file  Tobacco Use  . Smoking status: Current Every Day Smoker    Packs/day: 1.00    Types: Cigarettes    Start date: 12/19/1979  . Smokeless tobacco: Never Used  Substance and Sexual Activity  . Alcohol use: No    Alcohol/week: 0.0 standard drinks  . Drug use: No  . Sexual activity: Not Currently  Other Topics Concern  . Not on file  Social History Narrative  . Not on file   Social Determinants of Health   Financial Resource Strain:   . Difficulty of Paying Living Expenses: Not on file  Food Insecurity:   . Worried About Charity fundraiser in the Last Year: Not on file  . Ran Out of Food in the Last Year: Not on file  Transportation Needs:   . Lack of Transportation (Medical): Not on file  . Lack of Transportation (Non-Medical): Not on file  Physical Activity:   . Days of Exercise per Week: Not on file  . Minutes of Exercise per  Session: Not on file  Stress:   . Feeling of Stress : Not on file  Social Connections:   . Frequency of Communication with Friends and Family: Not on file  . Frequency of Social Gatherings with Friends and Family: Not on file  . Attends Religious Services: Not on file  . Active Member of Clubs or Organizations: Not on file  . Attends Archivist Meetings: Not on file  . Marital Status: Not on file  Intimate Partner Violence:   . Fear of Current or Ex-Partner: Not on file  . Emotionally Abused:  Not on file  . Physically Abused: Not on file  . Sexually Abused: Not on file     Family History  Problem Relation Age of Onset  . Heart Problems Mother   . Hypertension Mother   . Colon cancer Father   . Arthritis-Osteo Sister      Review of Systems: A 12-system review of systems was performed and is negative except as noted in the HPI.  Labs: No results for input(s): CKTOTAL, CKMB, TROPONINI in the last 72 hours. Lab Results  Component Value Date   WBC 13.0 (H) 02/09/2020   HGB 11.8 (L) 02/09/2020   HCT 35.4 (L) 02/09/2020   MCV 97.8 02/09/2020   PLT 297 02/09/2020    Recent Labs  Lab 02/05/20 0538 02/07/20 0450 02/09/20 1502  NA 140  --  136  K 4.7  --  4.3  CL 102  --  99  CO2 30  --  28  BUN 37*  --  32*  CREATININE 0.99   < > 1.16*  CALCIUM 8.5*  --  8.4*  PROT 5.7*  --   --   BILITOT 0.6  --   --   ALKPHOS 42  --   --   ALT 25  --   --   AST 17  --   --   GLUCOSE 95  --  154*   < > = values in this interval not displayed.   Lab Results  Component Value Date   CHOL 134 09/05/2019   HDL 35 (L) 09/05/2019   LDLCALC 74 09/05/2019   TRIG 125 09/05/2019   No results found for: DDIMER  Radiology/Studies:  DG Chest 2 View  Result Date: 01/30/2020 CLINICAL DATA:  Shortness of breath.  COVID positive. EXAM: CHEST - 2 VIEW COMPARISON:  Radiograph 12/30/2019 FINDINGS: Chronic elevation of right hemidiaphragm. Upper normal heart size with unchanged  mediastinal contours. Aortic atherosclerosis. Patchy heterogeneous bilateral airspace opacities in a mid-lower lung zone predominant distribution, left greater than right. No pneumothorax or large pleural effusion. Scoliotic curvature of spine. No acute osseous abnormalities are seen. IMPRESSION: Patchy heterogeneous bilateral airspace opacities in a mid-lower lung zone predominant distribution, left greater than right, consistent with COVID-19 pneumonia. Electronically Signed   By: Keith Rake M.D.   On: 01/30/2020 17:57    Wt Readings from Last 3 Encounters:  01/30/20 122.5 kg  01/02/20 127 kg  12/30/19 127 kg    EKG: Atrial fibrillation with rapid ventricular response  Physical Exam: Caucasian female complaining of shortness of breath with rapid heart rate Blood pressure (!) 72/48, pulse (!) 118, temperature 97.6 F (36.4 C), temperature source Oral, resp. rate 20, height 5\' 2"  (1.575 m), weight 122.5 kg, SpO2 96 %. Body mass index is 49.38 kg/m. General: Well developed, well nourished, in no acute distress. Head: Normocephalic, atraumatic, sclera non-icteric, no xanthomas, nares are without discharge.  Neck: Negative for carotid bruits. JVD not elevated. Lungs: Scattered rhonchi bilaterally Heart: Irregular regular Abdomen: Soft, non-tender, non-distended with normoactive bowel sounds. No hepatomegaly. No rebound/guarding. No obvious abdominal masses. Msk:  Strength and tone appear normal for age. Extremities: No clubbing or cyanosis. No edema.  Distal pedal pulses are 2+ and equal bilaterally. Neuro: Alert and oriented X 3. No facial asymmetry. No focal deficit. Moves all extremities spontaneously. Psych:  Responds to questions appropriately with a normal affect.     Assessment and Plan  69 year old female with COPD, tobacco abuse, depression, hyperlipidemia, diabetes admitted with Covid  positive pneumonia.  Was placed on remdesivir and Solu-Medrol.  Developed A. fib with RVR  with relative hypotension.  Was noted to be probable volume depleted due to her renal function.  Her blood pressure is improved with IV fluids.  She was on amlodipine, hydrochlorothiazide and losartan.  We will discontinue all of these due to relative hypotension and placed on IV amiodarone to improve her A. fib rate control.  Etiology of her A. fib is likely bilateral Covid positive pneumonia.  Patient has not had this previously.  Will give IV heparin initially and consider long-acting oral anticoagulations prior to discharge.  Would continue with remdesivir and steroids.  Continue with her thyroid replacement at 75 mcg daily.  Would hold antihypertensives following pressure.  Likely will need to resume these prior to discharge.  Will follow with you.  Would consider echocardiogram when rate slows down.  Would not proceed with echo with rapid A. fib.  Has ruled out for myocardial infarction.  Signed, Teodoro Spray MD 02/09/2020, 3:53 PM Pager: [phone]

## 2020-02-09 NOTE — Progress Notes (Addendum)
Went in room to find patient lying down in the bed and she stated that she did not feel well, she stated that she felt weak and dizzy. She said she was so weak she could not chew her lunch. Looked back at earlier vitals and noticed patients HR was tachy in 130s. Messaged MD, new order for EKG. EKG complete, patient was in AFIB with RVR. Messaged MD to let her know and called a rapid response. MD came and assessed patient, order for 547ml NS bolus and patient was transferred to ICU room 12. Called and gave report to [place].

## 2020-02-10 ENCOUNTER — Inpatient Hospital Stay
Admit: 2020-02-10 | Discharge: 2020-02-10 | Disposition: A | Discharge status: Home / Self Care | Payer: Medicare HMO | Attending: Cardiology | Admitting: Cardiology

## 2020-02-10 LAB — GLUCOSE, CAPILLARY
Glucose-Capillary: 94 mg/dL (ref 70–99)
Glucose-Capillary: 168 mg/dL — ABNORMAL HIGH (ref 70–99)
Glucose-Capillary: 81 mg/dL (ref 70–99)
Glucose-Capillary: 162 mg/dL — ABNORMAL HIGH (ref 70–99)

## 2020-02-10 LAB — ECHOCARDIOGRAM COMPLETE
AR max vel: 2.89 cm2
AV Area VTI: 2.56 cm2
AV Area mean vel: 2.81 cm2
AV Mean grad: 4 mmHg
AV Peak grad: 7.6 mmHg
Ao pk vel: 1.38 m/s
Area-P 1/2: 3.11 cm2
Height: 62 in
S' Lateral: 2.35 cm
Weight: 4320 oz

## 2020-02-10 LAB — CBC
HCT: 34.5 % — ABNORMAL LOW (ref 36.0–46.0)
Hemoglobin: 11.1 g/dL — ABNORMAL LOW (ref 12.0–15.0)
MCH: 31.9 pg (ref 26.0–34.0)
MCHC: 32.2 g/dL (ref 30.0–36.0)
MCV: 99.1 fL (ref 80.0–100.0)
Platelets: 255 10*3/uL (ref 150–400)
RBC: 3.48 MIL/uL — ABNORMAL LOW (ref 3.87–5.11)
RDW: 13.8 % (ref 11.5–15.5)
WBC: 13.7 10*3/uL — ABNORMAL HIGH (ref 4.0–10.5)
nRBC: 0.3 % — ABNORMAL HIGH (ref 0.0–0.2)

## 2020-02-10 LAB — T4, FREE: Free T4: 3.24 ng/dL — ABNORMAL HIGH (ref 0.61–1.12)

## 2020-02-10 LAB — TSH: TSH: 0.05 u[IU]/mL — ABNORMAL LOW (ref 0.350–4.500)

## 2020-02-10 LAB — HEPARIN LEVEL (UNFRACTIONATED): Heparin Unfractionated: 0.21 IU/mL — ABNORMAL LOW (ref 0.30–0.70)

## 2020-02-10 MED ORDER — APIXABAN 5 MG PO TABS
5.0000 mg | ORAL_TABLET | Freq: Two times a day (BID) | ORAL | Status: DC
  Administered 2020-02-10 – 2020-02-16 (×13): 5 mg via ORAL
  Filled 2020-02-10 (×13): qty 1

## 2020-02-10 MED ORDER — AMIODARONE HCL 200 MG PO TABS
200.0000 mg | ORAL_TABLET | Freq: Two times a day (BID) | ORAL | Status: DC
  Administered 2020-02-10 – 2020-02-11 (×3): 200 mg via ORAL
  Filled 2020-02-10 (×3): qty 1

## 2020-02-10 NOTE — Progress Notes (Signed)
PT Cancellation Note  Patient Details Name: Glenda Boone MRN: [id number] DOB: 1951/02/22   Cancelled Treatment:    Reason Eval/Treat Not Completed: Medical issues which prohibited therapy.  Chart reviewed.  Pt transferred to CCU 9/16 d/t a-fib with RVR and hypotension.  D/t pt transferring to higher level of care, per PT protocol require new PT consult in order to continue therapy (will discontinue current PT order d/t this).  Please re-consult PT when pt is medically appropriate to participate in PT.  Leitha Bleak, PT 02/10/20, 9:37 AM

## 2020-02-10 NOTE — Discharge Instructions (Signed)

## 2020-02-10 NOTE — Progress Notes (Signed)
Pt alert and oriented throughout shift, on RA, Controlled afib, No drips, called report to nurse on 1C taking pt. Will continue to monitor until transferred.

## 2020-02-10 NOTE — Consult Note (Signed)
Patient Name: Glenda Boone Date of Encounter: 02/10/2020  Hospital Problem List     Principal Problem:   Pneumonia due to COVID-19 virus Active Problems:   Depression, major, recurrent, moderate (HCC)   COPD (chronic obstructive pulmonary disease) ([place])   Diabetic ketoacidosis with coma associated with type 2 diabetes mellitus (HCC)   Essential hypertension   Hypoxia   Obesity, Class III, BMI 40-49.9 (morbid obesity) ([place])   Acute respiratory failure with hypoxia (HCC)   Type 2 diabetes mellitus with hyperlipidemia [facility])   Hypothyroidism    Patient Profile     69 y.o. female with history of morbid obesity, diabetes, COPD, depression anxiety who presented to the ER on 2020-01-31 with complaints of shortness of breath and a cough.  She tested positive for Covid 19 on 2020-01-23.  She was at a facility called [facility] for depression.  She was released home on Friday and felt worse over the weekend.  Loss sensation of taste and smell.  Was unvaccinated.  Continues to smoke approximately 1 pack of cigarettes a day.  Presented to emergency room where she was noted to be hypoxic.  Chest x-ray showed bilateral Covid pneumonia.  She was started on remdesivir and Solu-Medrol per Covid protocol.  She developed hypotension and A. fib with RVR and was transferred to the ICU.  She remains in A. fib with RVR.  Blood pressure is improved with IV fluids.  She complains of shortness of breath but no chest pain.  She has ruled out for mi.  Subjective   Less short of breath today.  Appears to have converted to sinus rhythm.  Inpatient Medications    . albuterol  2 puff Inhalation QID  . amiodarone  200 mg Oral BID  . apixaban  5 mg Oral BID  . aspirin EC  81 mg Oral Daily  . atorvastatin  20 mg Oral Daily  . Chlorhexidine Gluconate Cloth  6 each Topical Daily  . feeding supplement (ENSURE ENLIVE)  237 mL Oral TID BM  . insulin aspart  0-15 Units Subcutaneous TID WC  . insulin aspart  0-5  Units Subcutaneous QHS  . insulin detemir  5 Units Subcutaneous QHS  . levothyroxine  200 mcg Oral QAC breakfast  . levothyroxine  75 mcg Oral QAC breakfast  . metFORMIN  1,000 mg Oral BID WC  . mometasone-formoterol  2 puff Inhalation BID  . nicotine  14 mg Transdermal Daily  . PARoxetine  60 mg Oral Daily    Vital Signs    Vitals:   02/10/20 0200 02/10/20 0300 02/10/20 0400 02/10/20 0500  BP:  (!) 113/56 (!) 129/57 (!) 142/93  Pulse: (!) 111 (!) 49 82 71  Resp: (!) 24 20 17 19   Temp:   98.1 F (36.7 C)   TempSrc:   Oral   SpO2: 91% 92% 94% 96%  Weight:      Height:        Intake/Output Summary (Last 24 hours) at 02/10/2020 0853 Last data filed at 02/10/2020 0600 Gross per 24 hour  Intake 1437.73 ml  Output 1900 ml  Net -462.27 ml   Filed Weights   01/30/20 1648  Weight: 122.5 kg    Physical Exam    GEN: Well nourished, well developed, in no acute distress.  HEENT: normal.  Neck: Supple, no JVD, carotid bruits, or masses. Cardiac: RRR, no murmurs, rubs, or gallops. No clubbing, cyanosis, edema.  Radials/DP/PT 2+ and equal bilaterally.  Respiratory:  Respirations  regular and unlabored, clear to auscultation bilaterally. GI: Soft, nontender, nondistended, BS + x 4. MS: no deformity or atrophy. Skin: warm and dry, no rash. Neuro:  Strength and sensation are intact. Psych: Normal affect.  Labs    CBC Recent Labs    02/09/20 1502 02/10/20 0137  WBC 13.0* 13.7*  NEUTROABS 9.8*  --   HGB 11.8* 11.1*  HCT 35.4* 34.5*  MCV 97.8 99.1  PLT 297 672   Basic Metabolic Panel Recent Labs    02/09/20 1502  NA 136  K 4.3  CL 99  CO2 28  GLUCOSE 154*  BUN 32*  CREATININE 1.16*  CALCIUM 8.4*   Liver Function Tests No results for input(s): AST, ALT, ALKPHOS, BILITOT, PROT, ALBUMIN in the last 72 hours. No results for input(s): LIPASE, AMYLASE in the last 72 hours. Cardiac Enzymes No results for input(s): CKTOTAL, CKMB, CKMBINDEX, TROPONINI in the last 72  hours. BNP No results for input(s): BNP in the last 72 hours. D-Dimer No results for input(s): DDIMER in the last 72 hours. Hemoglobin A1C No results for input(s): HGBA1C in the last 72 hours. Fasting Lipid Panel No results for input(s): CHOL, HDL, LDLCALC, TRIG, CHOLHDL, LDLDIRECT in the last 72 hours. Thyroid Function Tests No results for input(s): TSH, T4TOTAL, T3FREE, THYROIDAB in the last 72 hours.  Invalid input(s): FREET3  Telemetry    A. fib with RVR converted to sinus rhythm  ECG    A. fib with RVR  Radiology    DG Chest 2 View  Result Date: 01/30/2020 CLINICAL DATA:  Shortness of breath.  COVID positive. EXAM: CHEST - 2 VIEW COMPARISON:  Radiograph 12/30/2019 FINDINGS: Chronic elevation of right hemidiaphragm. Upper normal heart size with unchanged mediastinal contours. Aortic atherosclerosis. Patchy heterogeneous bilateral airspace opacities in a mid-lower lung zone predominant distribution, left greater than right. No pneumothorax or large pleural effusion. Scoliotic curvature of spine. No acute osseous abnormalities are seen. IMPRESSION: Patchy heterogeneous bilateral airspace opacities in a mid-lower lung zone predominant distribution, left greater than right, consistent with COVID-19 pneumonia. Electronically Signed   By: Keith Rake M.D.   On: 01/30/2020 17:57    Assessment & Plan    69 year old female with COPD, tobacco abuse, depression, hyperlipidemia, diabetes admitted with Covid positive pneumonia.  Was placed on remdesivir and Solu-Medrol.  Developed A. fib with RVR with relative hypotension.  Was noted to be probable volume depleted due to her renal function.  Her blood pressure is improved with IV fluids.  She was on amlodipine, hydrochlorothiazide and losartan.    1.  Atrial fibrillation-converted to sinus rhythm with IV amiodarone.  Was placed on heparin.  Due to conversion to sinus rhythm we will continue discontinue IV amiodarone and convert to p.o.  amiodarone for now at 200 mg twice daily until her Covid pneumonia resolves as there is a high likelihood of recurrent A. fib at present.  Will discontinue heparin and placed on Eliquis at 5 mg twice daily.  Echocardiogram done today revealed normal LV function with an EF of 55 to 60% with LVH.  Left atrium and right atrium mildly dilated.  There were no significant other valvular abnormalities.  Will discontinue aspirin since she is on Eliquis.  2.  Covid pneumonia-continue with current therapy  3.  Hypertension-blood pressure still relatively soft off of her home antihypertensives.  Will remain off of these for now but will need to readdress this at discharge.  4.  Hypothyroidism-continue with Synthroid.  5.  Diabetes  mellitus-continue with Metformin and sliding scale coverage.  Signed, Javier Docker Nancie Bocanegra MD 02/10/2020, 8:53 AM  Pager: [phone]

## 2020-02-10 NOTE — Progress Notes (Signed)
Patient's daughter updated via phone per request.

## 2020-02-10 NOTE — Progress Notes (Signed)
PROGRESS NOTE    Glenda Boone  IEP:[id number] DOB: June 24, 1950 DOA: 01/30/2020 PCP: [facility] [place]   Brief Narrative: Taken from [place] is a 69 y.o. female with medical history significant for COPD, diabetes mellitus, hypertension, morbid obesity, depression/anxiety, hypothyroidism who presents by EMS for shortness of breath and cough.  Tested positive with Covid on 01/23/20. Patient is unvaccinated. Admitted with acute respiratory failure secondary to COVID-19 pneumonia.  Subjective: HR controlled after amiodarone bolus and gtt.  Pt transferred back to the floor.  Pt reported feeling better after a large BM.  No complaints.  TSH checked and was low.  Pt is hyperthyroid.   Assessment & Plan:   Principal Problem:   Pneumonia due to COVID-19 virus Active Problems:   Depression, major, recurrent, moderate (HCC)   COPD (chronic obstructive pulmonary disease) (HCC)   Diabetic ketoacidosis with coma associated with type 2 diabetes mellitus (HCC)   Essential hypertension   Hypoxia   Obesity, Class III, BMI 40-49.9 (morbid obesity) ([place])   Acute respiratory failure with hypoxia (HCC)   Type 2 diabetes mellitus with hyperlipidemia (HCC)   Hypothyroidism    New onset symptomatic A. fib with RVR.  Likely contributed by hyperthyroidism   Sudden onset new A. fib with RVR.  Patient becomes quite symptomatic with hypotension, dizziness, having some chest discomfort and palpitations.  Blood pressure in low 70s. Rapid response was called by nursing staff. -Cardiology consulted.  Rate controlled after amiodarone bolus and gtt --started on heparin gtt PLAN: --transitioned to Eliquis, per cards --transitioned to oral amiodarone, per cards --checked TSH, came back low at 0.05, checked T4, came back high at 3.24 --Hold home Synthroid for now (on 275 mcg daily at home)  Acute hypoxic respiratory failure secondary to COVID-19 pneumonia. Patient was  saturating well on RA now. Elevated inflammatory markers which now trending down. -Completed remdesivir and steroid. -Continue with supportive care and supplements.  Essential hypertension. Blood pressure mildly elevated in the morning and she did received all her antihypertensives.  Later developed symptomatic A. fib with RVR and became hypotensive. --continue to hold home amlodipine, HCTZ and losartan  Hx of Hypothyroidism Hyperthyroidism likely from too much Synthroid --checked TSH, came back low at 0.05, checked T4, came back high at 3.24 --Hold home Synthroid for now (on 275 mcg daily at home)  Type 2 diabetes mellitus. CBG improved.  -continue levemir 5u nightly --ACHS and SSI  AKI, ruled out Cr peaked at 1.24.  Baseline around 1.  Does not meet criteria for AKI  History of depression. No acute concern. -Continue home dose of Paxil.  Morbid obesity. Body mass index is 49.38 kg/m.  This will complicate overall prognosis.  Physical deconditioning. -PT is recommending SNF placement.   Objective: Vitals:   02/10/20 0500 02/10/20 0800 02/10/20 0900 02/10/20 1132  BP: (!) 142/93 123/77 (!) 140/109 110/60  Pulse: 71 (!) 117 (!) 46 (!) 112  Resp: 19 17 (!) 23 20  Temp:  98 F (36.7 C)  98.4 F (36.9 C)  TempSrc:  Oral  Oral  SpO2: 96% 94% 93% 98%  Weight:      Height:        Intake/Output Summary (Last 24 hours) at 02/10/2020 1519 Last data filed at 02/10/2020 1347 Gross per 24 hour  Intake 1750.21 ml  Output 1900 ml  Net -149.79 ml   Filed Weights   01/30/20 1648  Weight: 122.5 kg    Examination:  Constitutional:  NAD, AAOx3 HEENT: conjunctivae and lids normal, EOMI CV: RRR no M,R,G. Distal pulses +2.  No cyanosis.   RESP: CTA B/L, normal respiratory effort, on RA GI: +BS, NTND Extremities: generalized swelling in BLE SKIN: warm, dry and intact Neuro: II - XII grossly intact.  Sensation intact Psych: Normal mood and affect.  Appropriate judgement and  reason   DVT prophylaxis: XF:GHWEXHB Code Status: DNR  Family Communication:  Status is: inpatient Dispo:   The patient is from: home Anticipated d/c is to: SNF Anticipated d/c date is: 1-2 days Patient currently is not medically stable to d/c: New Afib w RVR, need to ensure rate controlled on current regimen for at least 1 day before discharge.    Consultants:   Cardiology.  Procedures:  Antimicrobials:   Data Reviewed: I have personally reviewed following labs and imaging studies  CBC: Recent Labs  Lab 02/04/20 0716 02/05/20 0538 02/09/20 1502 02/10/20 0137  WBC 11.6* 10.6* 13.0* 13.7*  NEUTROABS 8.2* 7.5 9.8*  --   HGB 10.6* 10.4* 11.8* 11.1*  HCT 31.6* 30.9* 35.4* 34.5*  MCV 96.6 96.9 97.8 99.1  PLT 360 345 297 716   Basic Metabolic Panel: Recent Labs  Lab 02/04/20 0716 02/05/20 0538 02/07/20 0450 02/09/20 1502  NA 141 140  --  136  K 4.5 4.7  --  4.3  CL 101 102  --  99  CO2 32 30  --  28  GLUCOSE 100* 95  --  154*  BUN 39* 37*  --  32*  CREATININE 0.92 0.99 1.07* 1.16*  CALCIUM 8.5* 8.5*  --  8.4*   GFR: Estimated Creatinine Clearance: 56.4 mL/min (A) (by C-G formula based on SCr of 1.16 mg/dL (H)). Liver Function Tests: Recent Labs  Lab 02/04/20 0716 02/05/20 0538  AST 18 17  ALT 26 25  ALKPHOS 46 42  BILITOT 0.7 0.6  PROT 5.8* 5.7*  ALBUMIN 2.8* 2.8*   No results for input(s): LIPASE, AMYLASE in the last 168 hours. No results for input(s): AMMONIA in the last 168 hours. Coagulation Profile: Recent Labs  Lab 02/09/20 1742  INR 1.1   Cardiac Enzymes: No results for input(s): CKTOTAL, CKMB, CKMBINDEX, TROPONINI in the last 168 hours. BNP (last 3 results) No results for input(s): PROBNP in the last 8760 hours. HbA1C: No results for input(s): HGBA1C in the last 72 hours. CBG: Recent Labs  Lab 02/09/20 1457 02/09/20 1659 02/09/20 2154 02/10/20 0750 02/10/20 1125  GLUCAP 149* 113* 209* 94 168*   Lipid Profile: No results  for input(s): CHOL, HDL, LDLCALC, TRIG, CHOLHDL, LDLDIRECT in the last 72 hours. Thyroid Function Tests: Recent Labs    02/10/20 0137  TSH 0.050*   Anemia Panel: No results for input(s): VITAMINB12, FOLATE, FERRITIN, TIBC, IRON, RETICCTPCT in the last 72 hours. Sepsis Labs: No results for input(s): PROCALCITON, LATICACIDVEN in the last 168 hours.  No results found for this or any previous visit (from the past 240 hour(s)).   Radiology Studies: ECHOCARDIOGRAM COMPLETE  Result Date: 02/10/2020    ECHOCARDIOGRAM REPORT   Patient Name:   Glenda Boone Aman Date of Exam: 02/10/2020 Medical Rec #:  [id number]       Height:       62.0 in Accession #:    [id number]      Weight:       270.0 lb Date of Birth:  05/16/1951       BSA:          2.172 m  Patient Age:    57 years        BP:           142/93 mmHg Patient Gender: F               HR:           71 bpm. Exam Location:  [facility] Procedure: 2D Echo, Cardiac Doppler and Color Doppler Indications:     Atrial Fibrillation 427.31  History:         Patient has prior history of Echocardiogram examinations, most                  recent 03/10/2019. COPD, Signs/Symptoms:Dyspnea; Risk                  Factors:Hypertension and Diabetes.  Sonographer:     Sherrie Sport RDCS (AE) Referring Phys:  [place] Diagnosing Phys: Bartholome Bill MD  Sonographer Comments: Suboptimal parasternal window. IMPRESSIONS  1. Left ventricular ejection fraction, by estimation, is 55 to 60%. The left ventricle has normal function. The left ventricle has no regional wall motion abnormalities. There is moderate left ventricular hypertrophy. Left ventricular diastolic parameters are consistent with Grade I diastolic dysfunction (impaired relaxation).  2. Right ventricular systolic function is normal. The right ventricular size is normal.  3. Left atrial size was mildly dilated.  4. Right atrial size was mildly dilated.  5. The mitral valve is grossly normal. No evidence of mitral valve  regurgitation.  6. The aortic valve was not well visualized. Aortic valve regurgitation is not visualized. FINDINGS  Left Ventricle: Left ventricular ejection fraction, by estimation, is 55 to 60%. The left ventricle has normal function. The left ventricle has no regional wall motion abnormalities. The left ventricular internal cavity size was normal in size. There is  moderate left ventricular hypertrophy. Left ventricular diastolic parameters are consistent with Grade I diastolic dysfunction (impaired relaxation). Right Ventricle: The right ventricular size is normal. No increase in right ventricular wall thickness. Right ventricular systolic function is normal. Left Atrium: Left atrial size was mildly dilated. Right Atrium: Right atrial size was mildly dilated. Pericardium: There is no evidence of pericardial effusion. Mitral Valve: The mitral valve is grossly normal. No evidence of mitral valve regurgitation. Tricuspid Valve: The tricuspid valve is not well visualized. Tricuspid valve regurgitation is trivial. Aortic Valve: The aortic valve was not well visualized. Aortic valve regurgitation is not visualized. Aortic valve mean gradient measures 4.0 mmHg. Aortic valve peak gradient measures 7.6 mmHg. Aortic valve area, by VTI measures 2.56 cm. Pulmonic Valve: The pulmonic valve was not well visualized. Pulmonic valve regurgitation is not visualized. Aorta: The aortic root is normal in size and structure. IAS/Shunts: The interatrial septum was not assessed.  LEFT VENTRICLE PLAX 2D LVIDd:         3.35 cm LVIDs:         2.35 cm LV PW:         1.16 cm LV IVS:        1.35 cm LVOT diam:     2.00 cm LV SV:         53 LV SV Index:   25 LVOT Area:     3.14 cm  RIGHT VENTRICLE RV S prime:     21.10 cm/s TAPSE (M-mode): 3.0 cm LEFT ATRIUM              Index       RIGHT ATRIUM  Index LA diam:        4.30 cm  1.98 cm/m  RA Area:     17.00 cm LA Vol (A2C):   49.5 ml  22.79 ml/m RA Volume:   46.80 ml  21.55 ml/m  LA Vol (A4C):   101.0 ml 46.51 ml/m LA Biplane Vol: 77.4 ml  35.64 ml/m  AORTIC VALVE                   PULMONIC VALVE AV Area (Vmax):    2.89 cm    PV Vmax:        0.76 m/s AV Area (Vmean):   2.81 cm    PV Peak grad:   2.3 mmHg AV Area (VTI):     2.56 cm    RVOT Peak grad: 4 mmHg AV Vmax:           138.00 cm/s AV Vmean:          94.200 cm/s AV VTI:            0.209 m AV Peak Grad:      7.6 mmHg AV Mean Grad:      4.0 mmHg LVOT Vmax:         127.00 cm/s LVOT Vmean:        84.300 cm/s LVOT VTI:          0.170 m LVOT/AV VTI ratio: 0.81 MITRAL VALVE                TRICUSPID VALVE MV Area (PHT): 3.11 cm     TR Peak grad:   9.2 mmHg MV Decel Time: 244 msec     TR Vmax:        152.00 cm/s MV E velocity: 124.00 cm/s                             SHUNTS                             Systemic VTI:  0.17 m                             Systemic Diam: 2.00 cm Bartholome Bill MD Electronically signed by Bartholome Bill MD Signature Date/Time: 02/10/2020/12:27:59 PM    Final     Scheduled Meds:  albuterol  2 puff Inhalation QID   amiodarone  200 mg Oral BID   apixaban  5 mg Oral BID   atorvastatin  20 mg Oral Daily   Chlorhexidine Gluconate Cloth  6 each Topical Daily   feeding supplement (ENSURE ENLIVE)  237 mL Oral TID BM   insulin aspart  0-15 Units Subcutaneous TID WC   insulin aspart  0-5 Units Subcutaneous QHS   insulin detemir  5 Units Subcutaneous QHS   levothyroxine  200 mcg Oral QAC breakfast   levothyroxine  75 mcg Oral QAC breakfast   metFORMIN  1,000 mg Oral BID WC   mometasone-formoterol  2 puff Inhalation BID   nicotine  14 mg Transdermal Daily   PARoxetine  60 mg Oral Daily   Continuous Infusions:  sodium chloride Stopped (02/09/20 1441)     LOS: 10 days    Enzo Bi, MD [facility]  If 7PM-7AM, please contact night-coverage [email or web address]  02/10/2020, 3:19 PM

## 2020-02-10 NOTE — Progress Notes (Signed)
*  PRELIMINARY RESULTS* Echocardiogram 2D Echocardiogram has been performed.  Sherrie Sport 02/10/2020, 9:54 AM

## 2020-02-10 NOTE — Consult Note (Signed)
ANTICOAGULATION CONSULT NOTE - Initial Consult  Pharmacy Consult for heparin Indication: atrial fibrillation  Allergies  Allergen Reactions  . Codeine   . Shellfish Allergy Hives  . Demerol [Meperidine Hcl] Anxiety  . Gabapentin Other (See Comments)    Gives nightmares    Patient Measurements: Height: 5\' 2"  (157.5 cm) Weight: 122.5 kg (270 lb) IBW/kg (Calculated) : 50.1 Heparin Dosing Weight: 80.6  Vital Signs: Temp: 98.6 F (37 C) (09/16 2025) Temp Source: Oral (09/16 2025) BP: 109/55 (09/17 0000) Pulse Rate: 90 (09/17 0000)  Labs: Recent Labs    02/07/20 0450 02/09/20 1502 02/09/20 1742 02/10/20 0137  HGB  --  11.8*  --  11.1*  HCT  --  35.4*  --  34.5*  PLT  --  297  --  255  APTT  --   --  32  --   LABPROT  --   --  13.3  --   INR  --   --  1.1  --   HEPARINUNFRC  --   --   --  0.21*  CREATININE 1.07* 1.16*  --   --   TROPONINIHS  --  10  --   --     Estimated Creatinine Clearance: 56.4 mL/min (A) (by C-G formula based on SCr of 1.16 mg/dL (H)).   Medical History: Past Medical History:  Diagnosis Date  . Anxiety   . Bronchitis   . COPD (chronic obstructive pulmonary disease) ([place])   . Depression   . Diabetes mellitus, type II ([place])   . Dyspnea   . GERD (gastroesophageal reflux disease)   . Hyperlipidemia   . Hypertension   . Hypothyroidism   . Neuropathy   . Ovarian cancer ([place])   . Shingles   . Thyroid disease     Medications:  Scheduled:  . albuterol  2 puff Inhalation QID  . amiodarone  150 mg Intravenous Once  . aspirin EC  81 mg Oral Daily  . atorvastatin  20 mg Oral Daily  . Chlorhexidine Gluconate Cloth  6 each Topical Daily  . feeding supplement (ENSURE ENLIVE)  237 mL Oral TID BM  . insulin aspart  0-15 Units Subcutaneous TID WC  . insulin aspart  0-5 Units Subcutaneous QHS  . insulin detemir  5 Units Subcutaneous QHS  . levothyroxine  200 mcg Oral QAC breakfast  . levothyroxine  75 mcg Oral QAC breakfast  . metFORMIN  1,000  mg Oral BID WC  . mometasone-formoterol  2 puff Inhalation BID  . nicotine  14 mg Transdermal Daily  . PARoxetine  60 mg Oral Daily    Assessment: 69 year old female presenting with shortness of breath and cough. Pt found to be COVID positive. PMH includes COPD, diabetes, HTN, depression/anxiety, and hypothyroidism. Pt started feeling weak and dizzy and was found to be in atrial fibrillation with RVR. Pt was on enoxaparin DVT prophylaxis. Pharmacy consulted to dose heparin.  Goal of Therapy:  Heparin level 0.3-0.7 units/ml Monitor platelets by anticoagulation protocol: Yes   Plan:  Discontinue enoxaparin  Give 4000 units bolus x 1 Start heparin infusion at 1100 units/hr Check anti-Xa level in 6 hours and daily while on heparin Continue to monitor H&H and platelets   0917 0137 HL 0.21, SUBtherapeutic, CBC stable.  Will increase Heparin rate to 1350 units/hr and recheck HL 8 hours after increase. Will check CBC daily per protocol  Ena Dawley, PharmD 02/10/2020 2:06 AM

## 2020-02-10 NOTE — Progress Notes (Signed)
Patient arrived from ICU in stable condition.

## 2020-02-11 DIAGNOSIS — E0590 Thyrotoxicosis, unspecified without thyrotoxic crisis or storm: Secondary | ICD-10-CM | POA: Diagnosis present

## 2020-02-11 DIAGNOSIS — I4891 Unspecified atrial fibrillation: Secondary | ICD-10-CM

## 2020-02-11 HISTORY — DX: Unspecified atrial fibrillation: I48.91

## 2020-02-11 LAB — CBC
HCT: 34 % — ABNORMAL LOW (ref 36.0–46.0)
Hemoglobin: 11.5 g/dL — ABNORMAL LOW (ref 12.0–15.0)
MCH: 33.2 pg (ref 26.0–34.0)
MCHC: 33.8 g/dL (ref 30.0–36.0)
MCV: 98.3 fL (ref 80.0–100.0)
Platelets: 191 10*3/uL (ref 150–400)
RBC: 3.46 MIL/uL — ABNORMAL LOW (ref 3.87–5.11)
RDW: 13.8 % (ref 11.5–15.5)
WBC: 12.3 10*3/uL — ABNORMAL HIGH (ref 4.0–10.5)
nRBC: 0 % (ref 0.0–0.2)

## 2020-02-11 LAB — BASIC METABOLIC PANEL
Anion gap: 9 (ref 5–15)
BUN: 26 mg/dL — ABNORMAL HIGH (ref 8–23)
CO2: 28 mmol/L (ref 22–32)
Calcium: 8.6 mg/dL — ABNORMAL LOW (ref 8.9–10.3)
Chloride: 100 mmol/L (ref 98–111)
Creatinine, Ser: 1.11 mg/dL — ABNORMAL HIGH (ref 0.44–1.00)
GFR calc Af Amer: 58 mL/min — ABNORMAL LOW (ref >60)
GFR calc non Af Amer: 50 mL/min — ABNORMAL LOW (ref >60)
Glucose, Bld: 110 mg/dL — ABNORMAL HIGH (ref 70–99)
Potassium: 4.6 mmol/L (ref 3.5–5.1)
Sodium: 137 mmol/L (ref 135–145)

## 2020-02-11 LAB — MAGNESIUM: Magnesium: 2.2 mg/dL (ref 1.7–2.4)

## 2020-02-11 LAB — GLUCOSE, CAPILLARY
Glucose-Capillary: 94 mg/dL (ref 70–99)
Glucose-Capillary: 115 mg/dL — ABNORMAL HIGH (ref 70–99)
Glucose-Capillary: 97 mg/dL (ref 70–99)
Glucose-Capillary: 166 mg/dL — ABNORMAL HIGH (ref 70–99)

## 2020-02-11 MED ORDER — AMIODARONE HCL 200 MG PO TABS
400.0000 mg | ORAL_TABLET | Freq: Two times a day (BID) | ORAL | Status: DC
  Administered 2020-02-11 – 2020-02-13 (×4): 400 mg via ORAL
  Filled 2020-02-11 (×4): qty 2

## 2020-02-11 NOTE — Progress Notes (Addendum)
PROGRESS NOTE    Glenda Boone  TWS:[id number] DOB: 22-Feb-1951 DOA: 01/30/2020 PCP: [facility] [place]   Brief Narrative: Taken from [name] is a 69 y.o. female with medical history significant for COPD, diabetes mellitus, hypertension, morbid obesity, depression/anxiety, hypothyroidism who presents by EMS for shortness of breath and cough.  Tested positive with Covid on 01/23/20. Patient is unvaccinated. Admitted with acute respiratory failure secondary to COVID-19 pneumonia.  Subjective: HR remained controlled.   Pt reported feeling tired this morning, otherwise, no new complaints.   Assessment & Plan:   Principal Problem:   Pneumonia due to COVID-19 virus Active Problems:   Depression, major, recurrent, moderate (HCC)   COPD (chronic obstructive pulmonary disease) (HCC)   Essential hypertension   Hypoxia   Obesity, Class III, BMI 40-49.9 (morbid obesity) ([place])   Acute respiratory failure with hypoxia (HCC)   Type 2 diabetes mellitus with hyperlipidemia (HCC)   Hypothyroidism   Atrial fibrillation with rapid ventricular response ([place])   Hyperthyroidism    New onset symptomatic A. fib with RVR.  Likely contributed by hyperthyroidism   Sudden onset new A. fib with RVR.  Patient becomes quite symptomatic with hypotension, dizziness, having some chest discomfort and palpitations.  Blood pressure in low 70s. Rapid response was called by nursing staff. -Cardiology consulted.  Rate controlled after amiodarone bolus and gtt --started on heparin gtt then transitioned to eliquis, per cards --checked TSH, came back low at 0.05, checked T4, came back high at 3.24 PLAN: --continue Eliquis --Hold ASA while on Eliquis --continue oral amiodarone, increase to 400 mg BID, per cards --Hold home Synthroid for now (on 275 mcg daily at home)  Acute hypoxic respiratory failure secondary to COVID-19 pneumonia. Patient was saturating well on RA now. Elevated  inflammatory markers which now trending down. -Completed remdesivir and steroid. -Continue with supportive care and supplements.  Essential hypertension.  --developed symptomatic A. fib with RVR and became hypotensive. --continue to hold home amlodipine, HCTZ and losartan  Hx of Hypothyroidism Hyperthyroidism likely from too much Synthroid --checked TSH, came back low at 0.05, checked T4, came back high at 3.24 --Hold home Synthroid for now (on 275 mcg daily at home)  Type 2 diabetes mellitus.  --Hold home metformin while inpatient -continue levemir 5u nightly --ACHS and SSI  AKI, ruled out Cr peaked at 1.24.  Baseline around 1.  Does not meet criteria for AKI  History of depression. No acute concern. -Continue home dose of Paxil.  Morbid obesity. Body mass index is 49.38 kg/m.  This will complicate overall prognosis.  Physical deconditioning. -PT is recommending SNF placement.   Objective: Vitals:   02/11/20 0815 02/11/20 1204 02/11/20 1211 02/11/20 1655  BP: (!) 143/86  118/64 129/80  Pulse:  84 97 86  Resp: 20 20 20  (!) 21  Temp: 98.3 F (36.8 C)  98.2 F (36.8 C) 97.9 F (36.6 C)  TempSrc: Oral  Oral Oral  SpO2:  96% 96% 94%  Weight:      Height:       No intake or output data in the 24 hours ending 02/11/20 1907 Filed Weights   01/30/20 1648  Weight: 122.5 kg    Examination:  Constitutional: NAD, AAOx3 HEENT: conjunctivae and lids normal, EOMI CV: RRR no M,R,G. Distal pulses +2.  No cyanosis.   RESP: CTA B/L, normal respiratory effort  GI: +BS, NTND Extremities: No effusions, edema in BLE SKIN: warm, dry and intact Neuro: II -  XII grossly intact.  Sensation intact Psych: Normal mood and affect.      DVT prophylaxis: [id number] Code Status: DNR  Family Communication:  Status is: inpatient Dispo:   The patient is from: home Anticipated d/c is to: SNF Anticipated d/c date is: whenever bed available Patient currently is medically stable to  d/c.   Consultants:   Cardiology.  Procedures:  Antimicrobials:   Data Reviewed: I have personally reviewed following labs and imaging studies  CBC: Recent Labs  Lab 02/05/20 0538 02/09/20 1502 02/10/20 0137 02/11/20 0536  WBC 10.6* 13.0* 13.7* 12.3*  NEUTROABS 7.5 9.8*  --   --   HGB 10.4* 11.8* 11.1* 11.5*  HCT 30.9* 35.4* 34.5* 34.0*  MCV 96.9 97.8 99.1 98.3  PLT 345 297 255 323   Basic Metabolic Panel: Recent Labs  Lab 02/05/20 0538 02/07/20 0450 02/09/20 1502 02/11/20 0536  NA 140  --  136 137  K 4.7  --  4.3 4.6  CL 102  --  99 100  CO2 30  --  28 28  GLUCOSE 95  --  154* 110*  BUN 37*  --  32* 26*  CREATININE 0.99 1.07* 1.16* 1.11*  CALCIUM 8.5*  --  8.4* 8.6*  MG  --   --   --  2.2   GFR: Estimated Creatinine Clearance: 58.9 mL/min (A) (by C-G formula based on SCr of 1.11 mg/dL (H)). Liver Function Tests: Recent Labs  Lab 02/05/20 0538  AST 17  ALT 25  ALKPHOS 42  BILITOT 0.6  PROT 5.7*  ALBUMIN 2.8*   No results for input(s): LIPASE, AMYLASE in the last 168 hours. No results for input(s): AMMONIA in the last 168 hours. Coagulation Profile: Recent Labs  Lab 02/09/20 1742  INR 1.1   Cardiac Enzymes: No results for input(s): CKTOTAL, CKMB, CKMBINDEX, TROPONINI in the last 168 hours. BNP (last 3 results) No results for input(s): PROBNP in the last 8760 hours. HbA1C: No results for input(s): HGBA1C in the last 72 hours. CBG: Recent Labs  Lab 02/10/20 1637 02/10/20 2010 02/11/20 0817 02/11/20 1214 02/11/20 1656  GLUCAP 81 162* 94 115* 97   Lipid Profile: No results for input(s): CHOL, HDL, LDLCALC, TRIG, CHOLHDL, LDLDIRECT in the last 72 hours. Thyroid Function Tests: Recent Labs    02/10/20 0137 02/10/20 1231  TSH 0.050*  --   FREET4  --  3.24*   Anemia Panel: No results for input(s): VITAMINB12, FOLATE, FERRITIN, TIBC, IRON, RETICCTPCT in the last 72 hours. Sepsis Labs: No results for input(s): PROCALCITON, LATICACIDVEN  in the last 168 hours.  No results found for this or any previous visit (from the past 240 hour(s)).   Radiology Studies: ECHOCARDIOGRAM COMPLETE  Result Date: 02/10/2020    ECHOCARDIOGRAM REPORT   Patient Name:   Glenda Boone Lesh Date of Exam: 02/10/2020 Medical Rec #:  [id number]       Height:       62.0 in Accession #:    [id number]      Weight:       270.0 lb Date of Birth:  [birth year]       BSA:          2.172 m Patient Age:    33 years        BP:           142/93 mmHg Patient Gender: F               HR:  71 bpm. Exam Location:  [facility] Procedure: 2D Echo, Cardiac Doppler and Color Doppler Indications:     Atrial Fibrillation 427.31  History:         Patient has prior history of Echocardiogram examinations, most                  recent 03/10/2019. COPD, Signs/Symptoms:Dyspnea; Risk                  Factors:Hypertension and Diabetes.  Sonographer:     Sherrie Sport RDCS (AE) Referring Phys:  [place] Diagnosing Phys: Bartholome Bill MD  Sonographer Comments: Suboptimal parasternal window. IMPRESSIONS  1. Left ventricular ejection fraction, by estimation, is 55 to 60%. The left ventricle has normal function. The left ventricle has no regional wall motion abnormalities. There is moderate left ventricular hypertrophy. Left ventricular diastolic parameters are consistent with Grade I diastolic dysfunction (impaired relaxation).  2. Right ventricular systolic function is normal. The right ventricular size is normal.  3. Left atrial size was mildly dilated.  4. Right atrial size was mildly dilated.  5. The mitral valve is grossly normal. No evidence of mitral valve regurgitation.  6. The aortic valve was not well visualized. Aortic valve regurgitation is not visualized. FINDINGS  Left Ventricle: Left ventricular ejection fraction, by estimation, is 55 to 60%. The left ventricle has normal function. The left ventricle has no regional wall motion abnormalities. The left ventricular internal cavity size  was normal in size. There is  moderate left ventricular hypertrophy. Left ventricular diastolic parameters are consistent with Grade I diastolic dysfunction (impaired relaxation). Right Ventricle: The right ventricular size is normal. No increase in right ventricular wall thickness. Right ventricular systolic function is normal. Left Atrium: Left atrial size was mildly dilated. Right Atrium: Right atrial size was mildly dilated. Pericardium: There is no evidence of pericardial effusion. Mitral Valve: The mitral valve is grossly normal. No evidence of mitral valve regurgitation. Tricuspid Valve: The tricuspid valve is not well visualized. Tricuspid valve regurgitation is trivial. Aortic Valve: The aortic valve was not well visualized. Aortic valve regurgitation is not visualized. Aortic valve mean gradient measures 4.0 mmHg. Aortic valve peak gradient measures 7.6 mmHg. Aortic valve area, by VTI measures 2.56 cm. Pulmonic Valve: The pulmonic valve was not well visualized. Pulmonic valve regurgitation is not visualized. Aorta: The aortic root is normal in size and structure. IAS/Shunts: The interatrial septum was not assessed.  LEFT VENTRICLE PLAX 2D LVIDd:         3.35 cm LVIDs:         2.35 cm LV PW:         1.16 cm LV IVS:        1.35 cm LVOT diam:     2.00 cm LV SV:         53 LV SV Index:   25 LVOT Area:     3.14 cm  RIGHT VENTRICLE RV S prime:     21.10 cm/s TAPSE (M-mode): 3.0 cm LEFT ATRIUM              Index       RIGHT ATRIUM           Index LA diam:        4.30 cm  1.98 cm/m  RA Area:     17.00 cm LA Vol (A2C):   49.5 ml  22.79 ml/m RA Volume:   46.80 ml  21.55 ml/m LA Vol (A4C):   101.0  ml 46.51 ml/m LA Biplane Vol: 77.4 ml  35.64 ml/m  AORTIC VALVE                   PULMONIC VALVE AV Area (Vmax):    2.89 cm    PV Vmax:        0.76 m/s AV Area (Vmean):   2.81 cm    PV Peak grad:   2.3 mmHg AV Area (VTI):     2.56 cm    RVOT Peak grad: 4 mmHg AV Vmax:           138.00 cm/s AV Vmean:           94.200 cm/s AV VTI:            0.209 m AV Peak Grad:      7.6 mmHg AV Mean Grad:      4.0 mmHg LVOT Vmax:         127.00 cm/s LVOT Vmean:        84.300 cm/s LVOT VTI:          0.170 m LVOT/AV VTI ratio: 0.81 MITRAL VALVE                TRICUSPID VALVE MV Area (PHT): 3.11 cm     TR Peak grad:   9.2 mmHg MV Decel Time: 244 msec     TR Vmax:        152.00 cm/s MV E velocity: 124.00 cm/s                             SHUNTS                             Systemic VTI:  0.17 m                             Systemic Diam: 2.00 cm Bartholome Bill MD Electronically signed by Bartholome Bill MD Signature Date/Time: 02/10/2020/12:27:59 PM    Final     Scheduled Meds: . albuterol  2 puff Inhalation QID  . amiodarone  400 mg Oral BID  . apixaban  5 mg Oral BID  . atorvastatin  20 mg Oral Daily  . feeding supplement (ENSURE ENLIVE)  237 mL Oral TID BM  . insulin aspart  0-15 Units Subcutaneous TID WC  . insulin aspart  0-5 Units Subcutaneous QHS  . insulin detemir  5 Units Subcutaneous QHS  . metFORMIN  1,000 mg Oral BID WC  . mometasone-formoterol  2 puff Inhalation BID  . nicotine  14 mg Transdermal Daily  . PARoxetine  60 mg Oral Daily   Continuous Infusions: . sodium chloride Stopped (02/09/20 1441)     LOS: 11 days    Enzo Bi, MD [facility]  If 7PM-7AM, please contact night-coverage [email or web address]  02/11/2020, 7:07 PM

## 2020-02-11 NOTE — Progress Notes (Signed)
Patient Name: Glenda Boone Date of Encounter: 02/11/2020  Hospital Problem List     Principal Problem:   Pneumonia due to COVID-19 virus Active Problems:   Depression, major, recurrent, moderate (HCC)   COPD (chronic obstructive pulmonary disease) ([place])   Diabetic ketoacidosis with coma associated with type 2 diabetes mellitus (HCC)   Essential hypertension   Hypoxia   Obesity, Class III, BMI 40-49.9 (morbid obesity) ([place])   Acute respiratory failure with hypoxia (HCC)   Type 2 diabetes mellitus with hyperlipidemia ([place])   Hypothyroidism    Patient Profile     69 y.o.femalewith history ofmorbid obesity, diabetes, COPD, depression anxiety who presented to the ER on 2020-01-31 with complaints of shortness of breath and a cough. She tested positive for Covid 19 on 2020-01-23. She was at a facility called [facility] for depression. She was released home on Friday and felt worse over the weekend. Loss sensation of taste and smell. Was unvaccinated. Continues to smoke approximately 1 pack of cigarettes a day. Presented to emergency room where she was noted to be hypoxic. Chest x-ray showed bilateral Covid pneumonia. She was started on remdesivir and Solu-Medrol per Covid protocol. She developed hypotension and A. fib with RVR and was transferred to the ICU. She remains in A. fib with RVR. Blood pressure is improved with IV fluids. She complains of shortness of breath but no chest pain. She has ruled out for mi.  Subjective      Inpatient Medications    . albuterol  2 puff Inhalation QID  . amiodarone  200 mg Oral BID  . apixaban  5 mg Oral BID  . atorvastatin  20 mg Oral Daily  . feeding supplement (ENSURE ENLIVE)  237 mL Oral TID BM  . insulin aspart  0-15 Units Subcutaneous TID WC  . insulin aspart  0-5 Units Subcutaneous QHS  . insulin detemir  5 Units Subcutaneous QHS  . metFORMIN  1,000 mg Oral BID WC  . mometasone-formoterol  2 puff Inhalation BID  .  nicotine  14 mg Transdermal Daily  . PARoxetine  60 mg Oral Daily    Vital Signs    Vitals:   02/11/20 0800 02/11/20 0815 02/11/20 1204 02/11/20 1211  BP: 110/65 (!) 143/86  118/64  Pulse: 99  84 97  Resp: 19 20 20 20   Temp:  98.3 F (36.8 C)  98.2 F (36.8 C)  TempSrc:  Oral  Oral  SpO2: 96%  96% 96%  Weight:      Height:        Intake/Output Summary (Last 24 hours) at 02/11/2020 1327 Last data filed at 02/10/2020 1347 Gross per 24 hour  Intake 240 ml  Output --  Net 240 ml   Filed Weights   01/30/20 1648  Weight: 122.5 kg    Physical Exam    Labs    CBC Recent Labs    02/09/20 1502 02/09/20 1502 02/10/20 0137 02/11/20 0536  WBC 13.0*   < > 13.7* 12.3*  NEUTROABS 9.8*  --   --   --   HGB 11.8*   < > 11.1* 11.5*  HCT 35.4*   < > 34.5* 34.0*  MCV 97.8   < > 99.1 98.3  PLT 297   < > 255 191   < > = values in this interval not displayed.   Basic Metabolic Panel Recent Labs    02/09/20 1502 02/11/20 0536  NA 136 137  K 4.3 4.6  CL  99 100  CO2 28 28  GLUCOSE 154* 110*  BUN 32* 26*  CREATININE 1.16* 1.11*  CALCIUM 8.4* 8.6*  MG  --  2.2   Liver Function Tests No results for input(s): AST, ALT, ALKPHOS, BILITOT, PROT, ALBUMIN in the last 72 hours. No results for input(s): LIPASE, AMYLASE in the last 72 hours. Cardiac Enzymes No results for input(s): CKTOTAL, CKMB, CKMBINDEX, TROPONINI in the last 72 hours. BNP No results for input(s): BNP in the last 72 hours. D-Dimer No results for input(s): DDIMER in the last 72 hours. Hemoglobin A1C No results for input(s): HGBA1C in the last 72 hours. Fasting Lipid Panel No results for input(s): CHOL, HDL, LDLCALC, TRIG, CHOLHDL, LDLDIRECT in the last 72 hours. Thyroid Function Tests Recent Labs    02/10/20 0137  TSH 0.050*    Telemetry    afib wit variable vr  ECG    afib with rvr  Radiology    DG Chest 2 View  Result Date: 01/30/2020 CLINICAL DATA:  Shortness of breath.  COVID positive.  EXAM: CHEST - 2 VIEW COMPARISON:  Radiograph 12/30/2019 FINDINGS: Chronic elevation of right hemidiaphragm. Upper normal heart size with unchanged mediastinal contours. Aortic atherosclerosis. Patchy heterogeneous bilateral airspace opacities in a mid-lower lung zone predominant distribution, left greater than right. No pneumothorax or large pleural effusion. Scoliotic curvature of spine. No acute osseous abnormalities are seen. IMPRESSION: Patchy heterogeneous bilateral airspace opacities in a mid-lower lung zone predominant distribution, left greater than right, consistent with COVID-19 pneumonia. Electronically Signed   By: Keith Rake M.D.   On: 01/30/2020 17:57   ECHOCARDIOGRAM COMPLETE  Result Date: 02/10/2020    ECHOCARDIOGRAM REPORT   Patient Name:   Glenda Boone Date of Exam: 02/10/2020 Medical Rec #:  [id number]       Height:       62.0 in Accession #:    [id number]      Weight:       270.0 lb Date of Birth:  03/05/1951       BSA:          2.172 m Patient Age:    36 years        BP:           142/93 mmHg Patient Gender: F               HR:           71 bpm. Exam Location:  [facility] Procedure: 2D Echo, Cardiac Doppler and Color Doppler Indications:     Atrial Fibrillation 427.31  History:         Patient has prior history of Echocardiogram examinations, most                  recent 03/10/2019. COPD, Signs/Symptoms:Dyspnea; Risk                  Factors:Hypertension and Diabetes.  Sonographer:     Sherrie Sport RDCS (AE) Referring Phys:  [place] Diagnosing Phys: Bartholome Bill MD  Sonographer Comments: Suboptimal parasternal window. IMPRESSIONS  1. Left ventricular ejection fraction, by estimation, is 55 to 60%. The left ventricle has normal function. The left ventricle has no regional wall motion abnormalities. There is moderate left ventricular hypertrophy. Left ventricular diastolic parameters are consistent with Grade I diastolic dysfunction (impaired relaxation).  2. Right ventricular  systolic function is normal. The right ventricular size is normal.  3. Left atrial size was mildly dilated.  4. Right  atrial size was mildly dilated.  5. The mitral valve is grossly normal. No evidence of mitral valve regurgitation.  6. The aortic valve was not well visualized. Aortic valve regurgitation is not visualized. FINDINGS  Left Ventricle: Left ventricular ejection fraction, by estimation, is 55 to 60%. The left ventricle has normal function. The left ventricle has no regional wall motion abnormalities. The left ventricular internal cavity size was normal in size. There is  moderate left ventricular hypertrophy. Left ventricular diastolic parameters are consistent with Grade I diastolic dysfunction (impaired relaxation). Right Ventricle: The right ventricular size is normal. No increase in right ventricular wall thickness. Right ventricular systolic function is normal. Left Atrium: Left atrial size was mildly dilated. Right Atrium: Right atrial size was mildly dilated. Pericardium: There is no evidence of pericardial effusion. Mitral Valve: The mitral valve is grossly normal. No evidence of mitral valve regurgitation. Tricuspid Valve: The tricuspid valve is not well visualized. Tricuspid valve regurgitation is trivial. Aortic Valve: The aortic valve was not well visualized. Aortic valve regurgitation is not visualized. Aortic valve mean gradient measures 4.0 mmHg. Aortic valve peak gradient measures 7.6 mmHg. Aortic valve area, by VTI measures 2.56 cm. Pulmonic Valve: The pulmonic valve was not well visualized. Pulmonic valve regurgitation is not visualized. Aorta: The aortic root is normal in size and structure. IAS/Shunts: The interatrial septum was not assessed.  LEFT VENTRICLE PLAX 2D LVIDd:         3.35 cm LVIDs:         2.35 cm LV PW:         1.16 cm LV IVS:        1.35 cm LVOT diam:     2.00 cm LV SV:         53 LV SV Index:   25 LVOT Area:     3.14 cm  RIGHT VENTRICLE RV S prime:     21.10 cm/s  TAPSE (M-mode): 3.0 cm LEFT ATRIUM              Index       RIGHT ATRIUM           Index LA diam:        4.30 cm  1.98 cm/m  RA Area:     17.00 cm LA Vol (A2C):   49.5 ml  22.79 ml/m RA Volume:   46.80 ml  21.55 ml/m LA Vol (A4C):   101.0 ml 46.51 ml/m LA Biplane Vol: 77.4 ml  35.64 ml/m  AORTIC VALVE                   PULMONIC VALVE AV Area (Vmax):    2.89 cm    PV Vmax:        0.76 m/s AV Area (Vmean):   2.81 cm    PV Peak grad:   2.3 mmHg AV Area (VTI):     2.56 cm    RVOT Peak grad: 4 mmHg AV Vmax:           138.00 cm/s AV Vmean:          94.200 cm/s AV VTI:            0.209 m AV Peak Grad:      7.6 mmHg AV Mean Grad:      4.0 mmHg LVOT Vmax:         127.00 cm/s LVOT Vmean:        84.300 cm/s LVOT VTI:  0.170 m LVOT/AV VTI ratio: 0.81 MITRAL VALVE                TRICUSPID VALVE MV Area (PHT): 3.11 cm     TR Peak grad:   9.2 mmHg MV Decel Time: 244 msec     TR Vmax:        152.00 cm/s MV E velocity: 124.00 cm/s                             SHUNTS                             Systemic VTI:  0.17 m                             Systemic Diam: 2.00 cm Bartholome Bill MD Electronically signed by Bartholome Bill MD Signature Date/Time: 02/10/2020/12:27:59 PM    Final     Assessment & Plan     69 year old female with COPD, tobacco abuse, depression, hyperlipidemia, diabetes admitted with Covid positive pneumonia. Was placed on remdesivir and Solu-Medrol. Developed A. fib with RVR with relative hypotension. Was noted to be probable volume depleted due to her renal function. Her blood pressure is improved with IV fluids. She was on amlodipine, hydrochlorothiazide and losartan as outpatient.   1.  Atrial fibrillation-converted to sinus rhythm with IV amiodarone but appears to have converted back to afib. .  . Was placed on Eliquis at 5 mg twice daily.Wil increase the po amiodarone to 400 bid.   Echocardiogram done today revealed normal LV function with an EF of 55 to 60% with LVH.  Left atrium and  right atrium mildly dilated.  There were no significant other valvular abnormalities.  Will remain off of aspirin since she is on Eliquis.  2.  Covid pneumonia-continue with current therapy  3.  Hypertension-blood pressure still relatively soft off of her home antihypertensives.  Will remain off of these for now but will need to readdress this at discharge.  4.  Hypothyroidism-continue with Synthroid.  5.  Diabetes mellitus-continue with Metformin and sliding scale coverage.    Signed, Javier Docker Tannia Contino MD 02/11/2020, 1:27 PM  Pager: [phone]

## 2020-02-12 DIAGNOSIS — Z7901 Long term (current) use of anticoagulants: Secondary | ICD-10-CM

## 2020-02-12 DIAGNOSIS — F17200 Nicotine dependence, unspecified, uncomplicated: Secondary | ICD-10-CM | POA: Diagnosis present

## 2020-02-12 DIAGNOSIS — D6869 Other thrombophilia: Secondary | ICD-10-CM | POA: Diagnosis not present

## 2020-02-12 LAB — BASIC METABOLIC PANEL
Anion gap: 9 (ref 5–15)
BUN: 29 mg/dL — ABNORMAL HIGH (ref 8–23)
CO2: 27 mmol/L (ref 22–32)
Calcium: 8.4 mg/dL — ABNORMAL LOW (ref 8.9–10.3)
Chloride: 100 mmol/L (ref 98–111)
Creatinine, Ser: 1.3 mg/dL — ABNORMAL HIGH (ref 0.44–1.00)
GFR calc Af Amer: 48 mL/min — ABNORMAL LOW (ref >60)
GFR calc non Af Amer: 42 mL/min — ABNORMAL LOW (ref >60)
Glucose, Bld: 100 mg/dL — ABNORMAL HIGH (ref 70–99)
Potassium: 4.4 mmol/L (ref 3.5–5.1)
Sodium: 136 mmol/L (ref 135–145)

## 2020-02-12 LAB — CBC
HCT: 30.7 % — ABNORMAL LOW (ref 36.0–46.0)
Hemoglobin: 10.4 g/dL — ABNORMAL LOW (ref 12.0–15.0)
MCH: 32.9 pg (ref 26.0–34.0)
MCHC: 33.9 g/dL (ref 30.0–36.0)
MCV: 97.2 fL (ref 80.0–100.0)
Platelets: 174 10*3/uL (ref 150–400)
RBC: 3.16 MIL/uL — ABNORMAL LOW (ref 3.87–5.11)
RDW: 14.1 % (ref 11.5–15.5)
WBC: 11.9 10*3/uL — ABNORMAL HIGH (ref 4.0–10.5)
nRBC: 0 % (ref 0.0–0.2)

## 2020-02-12 LAB — GLUCOSE, CAPILLARY
Glucose-Capillary: 88 mg/dL (ref 70–99)
Glucose-Capillary: 110 mg/dL — ABNORMAL HIGH (ref 70–99)
Glucose-Capillary: 87 mg/dL (ref 70–99)
Glucose-Capillary: 153 mg/dL — ABNORMAL HIGH (ref 70–99)

## 2020-02-12 LAB — MAGNESIUM: Magnesium: 2.2 mg/dL (ref 1.7–2.4)

## 2020-02-12 NOTE — Progress Notes (Signed)
At 0800 Mews score 2 due to elevated HR and respirations 22.  Pt was not in acute distress and was eating breakfast.  Scheduled Amiodarone given, but HR remain mostly 100-115 at rest.  Notified Dr Billie Ruddy at 1045.  MD acknowledged.  No new orders at this time.

## 2020-02-12 NOTE — Progress Notes (Signed)
PROGRESS NOTE    Glenda Boone  HDQ:[id number] DOB: [birth year] DOA: 01/30/2020 PCP: [facility] [place]   Brief Narrative: Taken from [place] is a 70 y.o. female with medical history significant for COPD, diabetes mellitus, hypertension, morbid obesity, depression/anxiety, hypothyroidism who presents by EMS for shortness of breath and cough.  Tested positive with Covid on 01/23/20. Patient is unvaccinated. Admitted with acute respiratory failure secondary to COVID-19 pneumonia.  Subjective: Pt reported feeling tired still.  No dyspnea, no chest pain.     Assessment & Plan:   Principal Problem:   Pneumonia due to COVID-19 virus Active Problems:   Depression, major, recurrent, moderate (HCC)   COPD (chronic obstructive pulmonary disease) (HCC)   Essential hypertension   Hypoxia   Obesity, Class III, BMI 40-49.9 (morbid obesity) ([place])   Acute respiratory failure with hypoxia (HCC)   Type 2 diabetes mellitus with hyperlipidemia (HCC)   Hypothyroidism   Atrial fibrillation with rapid ventricular response (HCC)   Hyperthyroidism   Acquired thrombophilia ([place])   Current use of long term anticoagulation   Current every day smoker    # New onset symptomatic A. fib with RVR.  Likely contributed by hyperthyroidism  # Acquired thrombophilia  Sudden onset new A. fib with RVR.  Patient becomes quite symptomatic with hypotension, dizziness, having some chest discomfort and palpitations.  Blood pressure in low 70s. Rapid response was called by nursing staff. -Cardiology consulted.  Rate controlled after amiodarone bolus and gtt --started on heparin gtt then transitioned to eliquis, per cards --checked TSH, came back low at 0.05, checked T4, came back high at 3.24 PLAN: --continue Eliquis --Hold ASA while on Eliquis --continue oral amiodarone 400 mg BID --Hold home Synthroid for now (on 275 mcg daily at home)  Acute hypoxic respiratory failure secondary  to COVID-19 pneumonia. Patient was saturating well on RA now. Elevated inflammatory markers which now trending down. -Completed remdesivir and steroid. -Continue with supportive care and supplements.  Essential hypertension.  --developed symptomatic A. fib with RVR and became hypotensive. --BP low normal still --continue to hold home amlodipine, HCTZ and losartan  Hx of Hypothyroidism Hyperthyroidism likely from too much Synthroid --checked TSH, came back low at 0.05, checked T4, came back high at 3.24 --Hold home Synthroid for now (on 275 mcg daily at home) --will need to follow up outpatient to monitor TSH  Type 2 diabetes mellitus.  --continue home metformin -continue levemir 5u nightly --ACHS and SSI  AKI, ruled out Cr peaked at 1.24.  Baseline around 1.  Does not meet criteria for AKI  History of depression. No acute concern. -Continue home dose of Paxil.  Currently smoker --nicotine patch  Morbid obesity. Body mass index is 49.38 kg/m.  This complicates overall prognosis.  Physical deconditioning. -PT is recommending SNF placement.   Objective: Vitals:   02/12/20 1000 02/12/20 1020 02/12/20 1200 02/12/20 1607  BP: 114/76 114/84 110/69 98/85  Pulse: (!) 105 91 99 93  Resp: (!) 23 (!) 21 (!) 21 20  Temp: 98.6 F (37 C)  97.7 F (36.5 C) 98.8 F (37.1 C)  TempSrc: Oral  Oral Oral  SpO2:  97% 96% 96%  Weight:      Height:       No intake or output data in the 24 hours ending 02/12/20 1703 Filed Weights   01/30/20 1648  Weight: 122.5 kg    Examination:  Constitutional: NAD, AAOx3 HEENT: conjunctivae and lids normal, EOMI CV: RRR  no M,R,G. Distal pulses +2.  No cyanosis.   RESP: mild rattling breath sounds, normal respiratory effort,on RA GI: +BS, NTND Extremities: No effusions, edema in BLE SKIN: warm, dry and intact Neuro: II - XII grossly intact.  Sensation intact Psych: Normal mood and affect.  Appropriate judgement and reason    DVT  prophylaxis: KT:GYBWLSL Code Status: DNR  Family Communication:  Status is: inpatient Dispo:   The patient is from: home Anticipated d/c is to: SNF Anticipated d/c date is: whenever bed available Patient currently is medically stable to d/c.   Consultants:   Cardiology.  Procedures:  Antimicrobials:   Data Reviewed: I have personally reviewed following labs and imaging studies  CBC: Recent Labs  Lab 02/09/20 1502 02/10/20 0137 02/11/20 0536 02/12/20 0552  WBC 13.0* 13.7* 12.3* 11.9*  NEUTROABS 9.8*  --   --   --   HGB 11.8* 11.1* 11.5* 10.4*  HCT 35.4* 34.5* 34.0* 30.7*  MCV 97.8 99.1 98.3 97.2  PLT 297 255 191 373   Basic Metabolic Panel: Recent Labs  Lab 02/07/20 0450 02/09/20 1502 02/11/20 0536 02/12/20 0552  NA  --  136 137 136  K  --  4.3 4.6 4.4  CL  --  99 100 100  CO2  --  28 28 27   GLUCOSE  --  154* 110* 100*  BUN  --  32* 26* 29*  CREATININE 1.07* 1.16* 1.11* 1.30*  CALCIUM  --  8.4* 8.6* 8.4*  MG  --   --  2.2 2.2   GFR: Estimated Creatinine Clearance: 50.3 mL/min (A) (by C-G formula based on SCr of 1.3 mg/dL (H)). Liver Function Tests: No results for input(s): AST, ALT, ALKPHOS, BILITOT, PROT, ALBUMIN in the last 168 hours. No results for input(s): LIPASE, AMYLASE in the last 168 hours. No results for input(s): AMMONIA in the last 168 hours. Coagulation Profile: Recent Labs  Lab 02/09/20 1742  INR 1.1   Cardiac Enzymes: No results for input(s): CKTOTAL, CKMB, CKMBINDEX, TROPONINI in the last 168 hours. BNP (last 3 results) No results for input(s): PROBNP in the last 8760 hours. HbA1C: No results for input(s): HGBA1C in the last 72 hours. CBG: Recent Labs  Lab 02/11/20 1656 02/11/20 2105 02/12/20 0802 02/12/20 1202 02/12/20 1658  GLUCAP 97 166* 88 110* 87   Lipid Profile: No results for input(s): CHOL, HDL, LDLCALC, TRIG, CHOLHDL, LDLDIRECT in the last 72 hours. Thyroid Function Tests: Recent Labs    02/10/20 0137  02/10/20 1231  TSH 0.050*  --   FREET4  --  3.24*   Anemia Panel: No results for input(s): VITAMINB12, FOLATE, FERRITIN, TIBC, IRON, RETICCTPCT in the last 72 hours. Sepsis Labs: No results for input(s): PROCALCITON, LATICACIDVEN in the last 168 hours.  No results found for this or any previous visit (from the past 240 hour(s)).   Radiology Studies: No results found.  Scheduled Meds: . albuterol  2 puff Inhalation QID  . amiodarone  400 mg Oral BID  . apixaban  5 mg Oral BID  . atorvastatin  20 mg Oral Daily  . feeding supplement (ENSURE ENLIVE)  237 mL Oral TID BM  . insulin aspart  0-15 Units Subcutaneous TID WC  . insulin aspart  0-5 Units Subcutaneous QHS  . insulin detemir  5 Units Subcutaneous QHS  . metFORMIN  1,000 mg Oral BID WC  . mometasone-formoterol  2 puff Inhalation BID  . nicotine  14 mg Transdermal Daily  . PARoxetine  60 mg  Oral Daily   Continuous Infusions: . sodium chloride Stopped (02/09/20 1441)     LOS: 12 days    Enzo Bi, MD [facility]  If 7PM-7AM, please contact night-coverage [email or web address]  02/12/2020, 5:03 PM

## 2020-02-13 LAB — MAGNESIUM: Magnesium: 2.1 mg/dL (ref 1.7–2.4)

## 2020-02-13 LAB — BASIC METABOLIC PANEL
Anion gap: 9 (ref 5–15)
BUN: 29 mg/dL — ABNORMAL HIGH (ref 8–23)
CO2: 26 mmol/L (ref 22–32)
Calcium: 8.3 mg/dL — ABNORMAL LOW (ref 8.9–10.3)
Chloride: 100 mmol/L (ref 98–111)
Creatinine, Ser: 1.13 mg/dL — ABNORMAL HIGH (ref 0.44–1.00)
GFR calc Af Amer: 57 mL/min — ABNORMAL LOW (ref >60)
GFR calc non Af Amer: 49 mL/min — ABNORMAL LOW (ref >60)
Glucose, Bld: 83 mg/dL (ref 70–99)
Potassium: 4.7 mmol/L (ref 3.5–5.1)
Sodium: 135 mmol/L (ref 135–145)

## 2020-02-13 LAB — CBC
HCT: 28.6 % — ABNORMAL LOW (ref 36.0–46.0)
Hemoglobin: 9.2 g/dL — ABNORMAL LOW (ref 12.0–15.0)
MCH: 32.1 pg (ref 26.0–34.0)
MCHC: 32.2 g/dL (ref 30.0–36.0)
MCV: 99.7 fL (ref 80.0–100.0)
Platelets: 165 10*3/uL (ref 150–400)
RBC: 2.87 MIL/uL — ABNORMAL LOW (ref 3.87–5.11)
RDW: 13.9 % (ref 11.5–15.5)
WBC: 11.5 10*3/uL — ABNORMAL HIGH (ref 4.0–10.5)
nRBC: 0 % (ref 0.0–0.2)

## 2020-02-13 LAB — GLUCOSE, CAPILLARY
Glucose-Capillary: 81 mg/dL (ref 70–99)
Glucose-Capillary: 112 mg/dL — ABNORMAL HIGH (ref 70–99)
Glucose-Capillary: 92 mg/dL (ref 70–99)
Glucose-Capillary: 123 mg/dL — ABNORMAL HIGH (ref 70–99)

## 2020-02-13 MED ORDER — LEVOTHYROXINE SODIUM 100 MCG PO TABS
200.0000 ug | ORAL_TABLET | Freq: Every day | ORAL | Status: DC
  Administered 2020-02-14 – 2020-02-16 (×3): 200 ug via ORAL
  Filled 2020-02-13 (×3): qty 2

## 2020-02-13 MED ORDER — AMIODARONE HCL 200 MG PO TABS
200.0000 mg | ORAL_TABLET | Freq: Two times a day (BID) | ORAL | Status: DC
  Administered 2020-02-13 – 2020-02-16 (×6): 200 mg via ORAL
  Filled 2020-02-13 (×6): qty 1

## 2020-02-13 NOTE — Progress Notes (Signed)
Son Glenda Boone called and updated.

## 2020-02-13 NOTE — Progress Notes (Signed)
PROGRESS NOTE    Glenda Boone  ZDG:[id number] DOB: 1951-05-16 DOA: 01/30/2020 PCP: [facility] [place]   Brief Narrative: Taken from [place] is a 69 y.o. female with medical history significant for COPD, diabetes mellitus, hypertension, morbid obesity, depression/anxiety, hypothyroidism who presents by EMS for shortness of breath and cough.  Tested positive with Covid on 01/23/20. Patient is unvaccinated. Admitted with acute respiratory failure secondary to COVID-19 pneumonia.  Subjective: Pt reported feeling more rested.  Appetite improved.  No pain.     Assessment & Plan:   Principal Problem:   Pneumonia due to COVID-19 virus Active Problems:   Depression, major, recurrent, moderate (HCC)   COPD (chronic obstructive pulmonary disease) (HCC)   Essential hypertension   Hypoxia   Obesity, Class III, BMI 40-49.9 (morbid obesity) ([place])   Acute respiratory failure with hypoxia (HCC)   Type 2 diabetes mellitus with hyperlipidemia (HCC)   Hypothyroidism   Atrial fibrillation with rapid ventricular response (HCC)   Hyperthyroidism   Acquired thrombophilia ([place])   Current use of long term anticoagulation   Current every day smoker    # New onset symptomatic A. fib with RVR.  Likely contributed by hyperthyroidism  # Acquired thrombophilia  Sudden onset new A. fib with RVR.  Patient becomes quite symptomatic with hypotension, dizziness, having some chest discomfort and palpitations.  Blood pressure in low 70s. Rapid response was called by nursing staff. -Cardiology consulted.  Rate controlled after amiodarone bolus and gtt --started on heparin gtt then transitioned to eliquis, per cards --checked TSH, came back low at 0.05, checked T4, came back high at 3.24 PLAN: --continue Eliquis --Hold ASA while on Eliquis --reduced amiodraone to 200 mg BID, per cards  Acute hypoxic respiratory failure secondary to COVID-19 pneumonia. Presented with 89% O2 sat  on RA, placed on 3L [place].  Patient saturating well on RA now. Elevated inflammatory markers which now trending down. -Completed remdesivir and steroid. -Continue with supportive care and supplements.  Essential hypertension.  --developed symptomatic A. fib with RVR and became hypotensive. --BP 90's-120's without any BP meds --continue to hold home amlodipine, HCTZ and losartan  Hx of Hypothyroidism Hyperthyroidism likely from too much Synthroid --checked TSH, came back low at 0.05, checked T4, came back high at 3.24.  Home Synthroid held (on 275 mcg daily at home) PLAN: --resume Synthroid at lower dose of 200 mcg daily starting tomorrow --will need to follow up outpatient to monitor TSH  Type 2 diabetes mellitus.  --continue home metformin -continue levemir 5u nightly --ACHS and SSI  AKI, ruled out Cr peaked at 1.24.  Baseline around 1.  Does not meet criteria for AKI  History of depression. No acute concern. -Continue home dose of Paxil.  Currently smoker --0.5-1 pack per day nicotine patch  COPD --continue home Dulera  Morbid obesity. Body mass index is 49.38 kg/m.  This complicates overall prognosis.  Physical deconditioning. -PT is recommending SNF placement.   Objective: Vitals:   02/12/20 1948 02/12/20 2353 02/13/20 0523 02/13/20 1134  BP: (!) 129/58 112/72 121/85 111/64  Pulse: 71 97 96 91  Resp: (!) 22 (!) 24 (!) 21 17  Temp: 97.8 F (36.6 C) 97.9 F (36.6 C) 98 F (36.7 C) 97.9 F (36.6 C)  TempSrc: Oral Oral  Oral  SpO2: 96% 97% 98% 95%  Weight:      Height:       No intake or output data in the 24 hours ending 02/13/20  [place]   01/30/20 1648  Weight: 122.5 kg    Examination:  Constitutional: NAD, AAOx3 HEENT: conjunctivae and lids normal, EOMI CV: RRR. Distal pulses +2.  No cyanosis.   RESP: CTA B/L, normal respiratory effort, on RA GI: +BS, NTND Extremities: mild generalized swelling in BLE SKIN: warm, dry and  intact Neuro: II - XII grossly intact.  Sensation intact Psych: Normal mood and affect.  Appropriate judgement and reason    DVT prophylaxis: TI:RWERXVQ Code Status: DNR  Family Communication:  Status is: inpatient Dispo:   The patient is from: home Anticipated d/c is to: SNF Anticipated d/c date is: whenever bed available Patient currently is medically stable to d/c.   Consultants:   Cardiology.  Procedures:  Antimicrobials:   Data Reviewed: I have personally reviewed following labs and imaging studies  CBC: Recent Labs  Lab 02/09/20 1502 02/10/20 0137 02/11/20 0536 02/12/20 0552 02/13/20 0316  WBC 13.0* 13.7* 12.3* 11.9* 11.5*  NEUTROABS 9.8*  --   --   --   --   HGB 11.8* 11.1* 11.5* 10.4* 9.2*  HCT 35.4* 34.5* 34.0* 30.7* 28.6*  MCV 97.8 99.1 98.3 97.2 99.7  PLT 297 255 191 174 008   Basic Metabolic Panel: Recent Labs  Lab 02/07/20 0450 02/09/20 1502 02/11/20 0536 02/12/20 0552 02/13/20 0316  NA  --  136 137 136 135  K  --  4.3 4.6 4.4 4.7  CL  --  99 100 100 100  CO2  --  28 28 27 26   GLUCOSE  --  154* 110* 100* 83  BUN  --  32* 26* 29* 29*  CREATININE 1.07* 1.16* 1.11* 1.30* 1.13*  CALCIUM  --  8.4* 8.6* 8.4* 8.3*  MG  --   --  2.2 2.2 2.1   GFR: Estimated Creatinine Clearance: 57.8 mL/min (A) (by C-G formula based on SCr of 1.13 mg/dL (H)). Liver Function Tests: No results for input(s): AST, ALT, ALKPHOS, BILITOT, PROT, ALBUMIN in the last 168 hours. No results for input(s): LIPASE, AMYLASE in the last 168 hours. No results for input(s): AMMONIA in the last 168 hours. Coagulation Profile: Recent Labs  Lab 02/09/20 1742  INR 1.1   Cardiac Enzymes: No results for input(s): CKTOTAL, CKMB, CKMBINDEX, TROPONINI in the last 168 hours. BNP (last 3 results) No results for input(s): PROBNP in the last 8760 hours. HbA1C: No results for input(s): HGBA1C in the last 72 hours. CBG: Recent Labs  Lab 02/12/20 1202 02/12/20 1658 02/12/20 1951  02/13/20 0803 02/13/20 1135  GLUCAP 110* 87 153* 81 112*   Lipid Profile: No results for input(s): CHOL, HDL, LDLCALC, TRIG, CHOLHDL, LDLDIRECT in the last 72 hours. Thyroid Function Tests: No results for input(s): TSH, T4TOTAL, FREET4, T3FREE, THYROIDAB in the last 72 hours. Anemia Panel: No results for input(s): VITAMINB12, FOLATE, FERRITIN, TIBC, IRON, RETICCTPCT in the last 72 hours. Sepsis Labs: No results for input(s): PROCALCITON, LATICACIDVEN in the last 168 hours.  No results found for this or any previous visit (from the past 240 hour(s)).   Radiology Studies: No results found.  Scheduled Meds: . albuterol  2 puff Inhalation QID  . amiodarone  200 mg Oral BID  . apixaban  5 mg Oral BID  . atorvastatin  20 mg Oral Daily  . feeding supplement (ENSURE ENLIVE)  237 mL Oral TID BM  . insulin aspart  0-15 Units Subcutaneous TID WC  . insulin aspart  0-5 Units Subcutaneous QHS  . insulin detemir  5 Units Subcutaneous QHS  . metFORMIN  1,000 mg Oral BID WC  . mometasone-formoterol  2 puff Inhalation BID  . nicotine  14 mg Transdermal Daily  . PARoxetine  60 mg Oral Daily   Continuous Infusions: . sodium chloride Stopped (02/09/20 1441)     LOS: 13 days    Enzo Bi, MD [facility]  If 7PM-7AM, please contact night-coverage [email or web address]  02/13/2020, 3:23 PM

## 2020-02-13 NOTE — Progress Notes (Signed)
A. fib rate is in better control.  Will reduce amiodarone from 400 mg twice daily to 200 mg twice daily and follow.  Continue with apixaban at 5 mg twice daily. Creatinine is improved to 1.13 today.

## 2020-02-14 LAB — CBC
HCT: 28.1 % — ABNORMAL LOW (ref 36.0–46.0)
Hemoglobin: 9.4 g/dL — ABNORMAL LOW (ref 12.0–15.0)
MCH: 32.4 pg (ref 26.0–34.0)
MCHC: 33.5 g/dL (ref 30.0–36.0)
MCV: 96.9 fL (ref 80.0–100.0)
Platelets: 174 10*3/uL (ref 150–400)
RBC: 2.9 MIL/uL — ABNORMAL LOW (ref 3.87–5.11)
RDW: 14 % (ref 11.5–15.5)
WBC: 10.2 10*3/uL (ref 4.0–10.5)
nRBC: 0 % (ref 0.0–0.2)

## 2020-02-14 LAB — GLUCOSE, CAPILLARY
Glucose-Capillary: 78 mg/dL (ref 70–99)
Glucose-Capillary: 108 mg/dL — ABNORMAL HIGH (ref 70–99)
Glucose-Capillary: 79 mg/dL (ref 70–99)
Glucose-Capillary: 104 mg/dL — ABNORMAL HIGH (ref 70–99)

## 2020-02-14 LAB — BASIC METABOLIC PANEL
Anion gap: 7 (ref 5–15)
BUN: 26 mg/dL — ABNORMAL HIGH (ref 8–23)
CO2: 28 mmol/L (ref 22–32)
Calcium: 8.3 mg/dL — ABNORMAL LOW (ref 8.9–10.3)
Chloride: 101 mmol/L (ref 98–111)
Creatinine, Ser: 1.06 mg/dL — ABNORMAL HIGH (ref 0.44–1.00)
GFR calc Af Amer: >60 mL/min (ref >60)
GFR calc non Af Amer: 53 mL/min — ABNORMAL LOW (ref >60)
Glucose, Bld: 86 mg/dL (ref 70–99)
Potassium: 4.7 mmol/L (ref 3.5–5.1)
Sodium: 136 mmol/L (ref 135–145)

## 2020-02-14 LAB — MAGNESIUM: Magnesium: 2.1 mg/dL (ref 1.7–2.4)

## 2020-02-14 NOTE — Progress Notes (Signed)
Please be advised that the above-named patient will require a short-term nursing home stay-anticipated 30 days or less for rehabilitation and strengthening. The plan is for return home.  

## 2020-02-14 NOTE — TOC Progression Note (Signed)
Transition of Care [facility]) - Progression Note    Patient Details  Name: Glenda Boone MRN: [id number] Date of Birth: Feb 05, 1951  Transition of [facility]) CM/SW Contact  Shelbie Hutching, RN Phone Number: 02/14/2020, 3:57 PM  Clinical Narrative:    RNCM spoke with patient via phone and updated on discharge planning.  Patient has no bed offers as of yet, expanding search further into [place].  Patient did say that she does not want to get in touch with her daughter and that she wanted her contact to be her sister [name]- patient does not currently have her telephone number but her sister is supposed to call and she will write the number down.    Expected Discharge Plan: Home/Self Care Barriers to Discharge: Continued Medical Work up  Expected Discharge Plan and Services Expected Discharge Plan: Home/Self Care   Discharge Planning Services: CM Consult Post Acute Care Choice: NA Living arrangements for the past 2 months: Apartment                 DME Arranged: [place] rolling DME Agency: [employer] Date DME Agency Contacted: 02/02/20 Time DME Agency Contacted: [id number] Representative spoke with at DME Agency: Mardene Celeste HH Arranged: Refused [facility]           Social Determinants of Health ([place]) Interventions    Readmission Risk Interventions Readmission Risk Prevention Plan 02/02/2020  Transportation Screening Complete  PCP or Specialist Appt within 3-5 Days Complete  HRI or [place] Complete  Social Work Consult for [place] Planning/Counseling Complete  Palliative Care Screening Not Applicable  Medication Review [employer]) Complete  Some recent data might be hidden

## 2020-02-14 NOTE — Plan of Care (Signed)
  Problem: Education: Goal: Knowledge of General Education information will improve Description: Including pain rating scale, medication(s)/side effects and non-pharmacologic comfort measures Outcome: Progressing   Problem: Education: Goal: Knowledge of risk factors and measures for prevention of condition will improve Outcome: Progressing   Problem: Coping: Goal: Psychosocial and spiritual needs will be supported Outcome: Progressing   Problem: Respiratory: Goal: Will maintain a patent airway Outcome: Progressing Goal: Complications related to the disease process, condition or treatment will be avoided or minimized Outcome: Progressing   

## 2020-02-14 NOTE — Progress Notes (Signed)
PROGRESS NOTE    Glenda Boone  [id number]:[id number] DOB: July 12, 1950 DOA: 01/30/2020 PCP: [facility] [place]   Brief Narrative: Taken from [place] is a 69 y.o. female with medical history significant for COPD, diabetes mellitus, hypertension, morbid obesity, depression/anxiety, hypothyroidism who presents by EMS for shortness of breath and cough.  Tested positive with Covid on 01/23/20. Patient is unvaccinated. Admitted with acute respiratory failure secondary to COVID-19 pneumonia.  Subjective: Pt reported not sleeping well.  But denied pain or dyspnea.     Assessment & Plan:   Principal Problem:   Pneumonia due to COVID-19 virus Active Problems:   Depression, major, recurrent, moderate (HCC)   COPD (chronic obstructive pulmonary disease) (HCC)   Essential hypertension   Hypoxia   Obesity, Class III, BMI 40-49.9 (morbid obesity) ([place])   Acute respiratory failure with hypoxia (HCC)   Type 2 diabetes mellitus with hyperlipidemia (HCC)   Hypothyroidism   Atrial fibrillation with rapid ventricular response (HCC)   Hyperthyroidism   Acquired thrombophilia ([place])   Current use of long term anticoagulation   Current every day smoker    # New onset symptomatic A. fib with RVR.  Likely contributed by hyperthyroidism  # Acquired thrombophilia  Sudden onset new A. fib with RVR.  Patient becomes quite symptomatic with hypotension, dizziness, having some chest discomfort and palpitations.  Blood pressure in low 70s. Rapid response was called by nursing staff. -Cardiology consulted.  Rate controlled after amiodarone bolus and gtt --started on heparin gtt then transitioned to eliquis, per cards --checked TSH, came back low at 0.05, checked T4, came back high at 3.24 PLAN: --continue Eliquis --Hold ASA while on Eliquis --continue amiodraone 200 mg BID, per cards  Acute hypoxic respiratory failure secondary to COVID-19 pneumonia. Presented with 89% O2 sat on  RA, placed on 3L [place].  Patient saturating well on RA now. Elevated inflammatory markers which now trending down. -Completed remdesivir and steroid. -Continue with supportive care and supplements.  Essential hypertension.  --developed symptomatic A. fib with RVR and became hypotensive. --BP wnl without any BP meds --continue to hold home amlodipine, HCTZ and losartan  Hx of Hypothyroidism Hyperthyroidism likely from too much Synthroid --checked TSH, came back low at 0.05, checked T4, came back high at 3.24.  Home Synthroid held (on 275 mcg daily at home) PLAN: --continue Synthroid at lower dose of 200 mcg daily  --will need to follow up outpatient to monitor TSH  Type 2 diabetes mellitus.  --continue home metformin -continue levemir 5u nightly --ACHS and SSI  AKI, ruled out Cr peaked at 1.24.  Baseline around 1.  Does not meet criteria for AKI  History of depression. No acute concern. -Continue home dose of Paxil.  Currently smoker --0.5-1 pack per day nicotine patch  COPD --continue home Dulera  Morbid obesity. Body mass index is 49.38 kg/m.  This complicates overall prognosis.  Physical deconditioning. -PT is recommending SNF placement.   Objective: Vitals:   02/13/20 2332 02/14/20 0454 02/14/20 0743 02/14/20 1129  BP: 128/70 131/63 (!) 106/59 115/67  Pulse: 100 94 73 75  Resp: 17 15 (!) 21 20  Temp: 97.6 F (36.4 C) 97.9 F (36.6 C) 98.9 F (37.2 C) 98.7 F (37.1 C)  TempSrc: Oral  Oral Oral  SpO2: 96% 96% 94% 94%  Weight:      Height:       No intake or output data in the 24 hours ending 02/14/20 [place]  01/30/20 1648  Weight: 122.5 kg    Examination:  Constitutional: NAD, AAOx3 HEENT: conjunctivae and lids normal, EOMI CV: irregular. Distal pulses +2.  No cyanosis.   RESP: CTA B/L, normal respiratory effort, on RA GI: +BS, NTND Extremities: mild generalized swelling in BLE SKIN: warm, dry and intact Neuro: II - XII grossly  intact.  Sensation intact Psych: Normal mood and affect.  Appropriate judgement and reason    DVT prophylaxis: LO:[id number] Code Status: DNR  Family Communication:  Status is: inpatient Dispo:   The patient is from: home Anticipated d/c is to: SNF Anticipated d/c date is: whenever bed available Patient currently is medically stable to d/c.   Consultants:   Cardiology.  Procedures:  Antimicrobials:   Data Reviewed: I have personally reviewed following labs and imaging studies  CBC: Recent Labs  Lab 02/09/20 1502 02/09/20 1502 02/10/20 0137 02/11/20 0536 02/12/20 0552 02/13/20 0316 02/14/20 0539  WBC 13.0*   < > 13.7* 12.3* 11.9* 11.5* 10.2  NEUTROABS 9.8*  --   --   --   --   --   --   HGB 11.8*   < > 11.1* 11.5* 10.4* 9.2* 9.4*  HCT 35.4*   < > 34.5* 34.0* 30.7* 28.6* 28.1*  MCV 97.8   < > 99.1 98.3 97.2 99.7 96.9  PLT 297   < > 255 191 174 165 174   < > = values in this interval not displayed.   Basic Metabolic Panel: Recent Labs  Lab 02/09/20 1502 02/11/20 0536 02/12/20 0552 02/13/20 0316 02/14/20 0539  NA 136 137 136 135 136  K 4.3 4.6 4.4 4.7 4.7  CL 99 100 100 100 101  CO2 28 28 27 26 28   GLUCOSE 154* 110* 100* 83 86  BUN 32* 26* 29* 29* 26*  CREATININE 1.16* 1.11* 1.30* 1.13* 1.06*  CALCIUM 8.4* 8.6* 8.4* 8.3* 8.3*  MG  --  2.2 2.2 2.1 2.1   GFR: Estimated Creatinine Clearance: 61.7 mL/min (A) (by C-G formula based on SCr of 1.06 mg/dL (H)). Liver Function Tests: No results for input(s): AST, ALT, ALKPHOS, BILITOT, PROT, ALBUMIN in the last 168 hours. No results for input(s): LIPASE, AMYLASE in the last 168 hours. No results for input(s): AMMONIA in the last 168 hours. Coagulation Profile: Recent Labs  Lab 02/09/20 1742  INR 1.1   Cardiac Enzymes: No results for input(s): CKTOTAL, CKMB, CKMBINDEX, TROPONINI in the last 168 hours. BNP (last 3 results) No results for input(s): PROBNP in the last 8760 hours. HbA1C: No results for  input(s): HGBA1C in the last 72 hours. CBG: Recent Labs  Lab 02/13/20 1135 02/13/20 1522 02/13/20 2047 02/14/20 0747 02/14/20 1132  GLUCAP 112* 92 123* 78 108*   Lipid Profile: No results for input(s): CHOL, HDL, LDLCALC, TRIG, CHOLHDL, LDLDIRECT in the last 72 hours. Thyroid Function Tests: No results for input(s): TSH, T4TOTAL, FREET4, T3FREE, THYROIDAB in the last 72 hours. Anemia Panel: No results for input(s): VITAMINB12, FOLATE, FERRITIN, TIBC, IRON, RETICCTPCT in the last 72 hours. Sepsis Labs: No results for input(s): PROCALCITON, LATICACIDVEN in the last 168 hours.  No results found for this or any previous visit (from the past 240 hour(s)).   Radiology Studies: No results found.  Scheduled Meds: . albuterol  2 puff Inhalation QID  . amiodarone  200 mg Oral BID  . apixaban  5 mg Oral BID  . atorvastatin  20 mg Oral Daily  . feeding supplement (ENSURE ENLIVE)  237 mL Oral  TID BM  . insulin aspart  0-15 Units Subcutaneous TID WC  . insulin aspart  0-5 Units Subcutaneous QHS  . insulin detemir  5 Units Subcutaneous QHS  . levothyroxine  200 mcg Oral Q0600  . metFORMIN  1,000 mg Oral BID WC  . mometasone-formoterol  2 puff Inhalation BID  . nicotine  14 mg Transdermal Daily  . PARoxetine  60 mg Oral Daily   Continuous Infusions: . sodium chloride Stopped (02/09/20 1441)     LOS: 14 days    Enzo Bi, MD [facility]  If 7PM-7AM, please contact night-coverage [email or web address]  02/14/2020, 3:20 PM

## 2020-02-15 LAB — BASIC METABOLIC PANEL
Anion gap: 9 (ref 5–15)
BUN: 22 mg/dL (ref 8–23)
CO2: 26 mmol/L (ref 22–32)
Calcium: 8.5 mg/dL — ABNORMAL LOW (ref 8.9–10.3)
Chloride: 100 mmol/L (ref 98–111)
Creatinine, Ser: 1.09 mg/dL — ABNORMAL HIGH (ref 0.44–1.00)
GFR calc Af Amer: 60 mL/min — ABNORMAL LOW (ref >60)
GFR calc non Af Amer: 51 mL/min — ABNORMAL LOW (ref >60)
Glucose, Bld: 88 mg/dL (ref 70–99)
Potassium: 4.9 mmol/L (ref 3.5–5.1)
Sodium: 135 mmol/L (ref 135–145)

## 2020-02-15 LAB — CBC
HCT: 27.4 % — ABNORMAL LOW (ref 36.0–46.0)
Hemoglobin: 9.2 g/dL — ABNORMAL LOW (ref 12.0–15.0)
MCH: 32.6 pg (ref 26.0–34.0)
MCHC: 33.6 g/dL (ref 30.0–36.0)
MCV: 97.2 fL (ref 80.0–100.0)
Platelets: 185 10*3/uL (ref 150–400)
RBC: 2.82 MIL/uL — ABNORMAL LOW (ref 3.87–5.11)
RDW: 14.1 % (ref 11.5–15.5)
WBC: 8.3 10*3/uL (ref 4.0–10.5)
nRBC: 0 % (ref 0.0–0.2)

## 2020-02-15 LAB — GLUCOSE, CAPILLARY
Glucose-Capillary: 71 mg/dL (ref 70–99)
Glucose-Capillary: 93 mg/dL (ref 70–99)
Glucose-Capillary: 70 mg/dL (ref 70–99)
Glucose-Capillary: 78 mg/dL (ref 70–99)

## 2020-02-15 LAB — MAGNESIUM: Magnesium: 2.3 mg/dL (ref 1.7–2.4)

## 2020-02-15 NOTE — TOC Progression Note (Signed)
Transition of Care [facility]) - Progression Note    Patient Details  Name: Glenda Boone MRN: [id number] Date of Birth: 06-14-1950  Transition of Care [facility]) CM/SW Contact  Shelbie Hutching, RN Phone Number: 02/15/2020, 4:51 PM  Clinical Narrative:    Plan for patient to go to [facility] tomorrow.  RNCM was able to speak with the patient's sister per patient's request.  Jennifer(sister) [phone], will be back in town on Monday or Tuesday of next week and she be buying the patient some clothes for the facility, the patient has nothing.  [facility] has opened a case and will be following up with the patient.  [facility] aware of the short term rehab placement and then hopefully [facility] will be able to find housing in an assisted living or group home environment and assist with the patient getting long term care Medicaid.    Expected Discharge Plan: Home/Self Care Barriers to Discharge: Continued Medical Work up  Expected Discharge Plan and Services Expected Discharge Plan: Home/Self Care   Discharge Planning Services: CM Consult Post Acute Care Choice: NA Living arrangements for the past 2 months: Apartment                 DME Arranged: Walker rolling DME Agency: [employer] Date DME Agency Contacted: 02/02/20 Time DME Agency Contacted: [phone] Representative spoke with at DME Agency: Mardene Celeste HH Arranged: Refused [place]           Social Determinants of Health ([place]) Interventions    Readmission Risk Interventions Readmission Risk Prevention Plan 02/02/2020  Transportation Screening Complete  PCP or Specialist Appt within 3-5 Days Complete  HRI or [place] Complete  Social Work Consult for [place] Planning/Counseling Complete  Palliative Care Screening Not Applicable  Medication Review Press photographer) Complete  Some recent data might be hidden

## 2020-02-15 NOTE — Progress Notes (Signed)
PROGRESS NOTE    Glenda Boone  JAS:[id number] DOB: March 29, 1951 DOA: 01/30/2020 PCP: [facility] [place]   Brief Narrative: Taken from [place] is a 69 y.o. female with medical history significant for COPD, diabetes mellitus, hypertension, morbid obesity, depression/anxiety, hypothyroidism who presents by EMS for shortness of breath and cough.  Tested positive with Covid on 01/23/20. Patient is unvaccinated. Admitted with acute respiratory failure secondary to COVID-19 pneumonia.  Subjective: Pt reported feeling more well rested today.  No new complaints.     Assessment & Plan:   Principal Problem:   Pneumonia due to COVID-19 virus Active Problems:   Depression, major, recurrent, moderate (HCC)   COPD (chronic obstructive pulmonary disease) (HCC)   Essential hypertension   Hypoxia   Obesity, Class III, BMI 40-49.9 (morbid obesity) ([place])   Acute respiratory failure with hypoxia (HCC)   Type 2 diabetes mellitus with hyperlipidemia (HCC)   Hypothyroidism   Atrial fibrillation with rapid ventricular response (HCC)   Hyperthyroidism   Acquired thrombophilia ([place])   Current use of long term anticoagulation   Current every day smoker    # New onset symptomatic A. fib with RVR.  Likely contributed by hyperthyroidism  # Acquired thrombophilia  Sudden onset new A. fib with RVR.  Patient becomes quite symptomatic with hypotension, dizziness, having some chest discomfort and palpitations.  Blood pressure in low 70s. Rapid response was called by nursing staff. -Cardiology consulted.  Rate controlled after amiodarone bolus and gtt --started on heparin gtt then transitioned to eliquis, per cards --checked TSH, came back low at 0.05, checked T4, came back high at 3.24 PLAN: --continue Eliquis --Hold ASA while on Eliquis --continue amiodraone 200 mg BID, per cards --continue Synthroid at reduced dose of 200 mcg daily  Acute hypoxic respiratory failure  secondary to COVID-19 pneumonia. Presented with 89% O2 sat on RA, placed on 3L [place].  Patient saturating well on RA now. Elevated inflammatory markers which now trending down. -Completed remdesivir and steroid. -Continue with supportive care and supplements.  Essential hypertension.  --developed symptomatic A. fib with RVR and became hypotensive. --on home amlodipine, HCTZ and losartan --BP varied from 100's-160's --continue to hold home BP meds for now  Hx of Hypothyroidism Hyperthyroidism likely from too much Synthroid --checked TSH, came back low at 0.05, checked T4, came back high at 3.24.  Home Synthroid held (on 275 mcg daily at home) PLAN: --continue synthroid at 200 mcg daily --will need to follow up outpatient to monitor TSH  Type 2 diabetes mellitus.  --A1c 5.9 a month ago. --BG have mostly been <100 --d/c fingersticks and insulin --continue home metformin  AKI, ruled out Cr peaked at 1.24.  Baseline around 1.  Does not meet criteria for AKI  History of depression. No acute concern. -Continue home dose of Paxil.  Currently smoker --0.5-1 pack per day nicotine patch  COPD --continue home Dulera  Morbid obesity. Body mass index is 49.38 kg/m.  This complicates overall prognosis.  Physical deconditioning. -PT is recommending SNF placement.   Objective: Vitals:   02/15/20 0036 02/15/20 0545 02/15/20 0733 02/15/20 1156  BP: 135/65 (!) 165/73 125/77 (!) 145/84  Pulse: 66 69 (!) 57 63  Resp: 20 20 18 18   Temp: 97.7 F (36.5 C) 98 F (36.7 C) 98 F (36.7 C) 98 F (36.7 C)  TempSrc: Oral Oral Oral Oral  SpO2: 100% 99% 99% 99%  Weight:      Height:  Intake/Output Summary (Last 24 hours) at 02/15/2020 1424 Last data filed at 02/15/2020 1300 Gross per 24 hour  Intake 960 ml  Output --  Net 960 ml   Filed Weights   01/30/20 1648  Weight: 122.5 kg    Examination:  Constitutional: NAD, AAOx3 HEENT: conjunctivae and lids normal, EOMI CV: RRR  no M,R,G. Distal pulses +2.  No cyanosis.   RESP: CTA B/L, normal respiratory effort  GI: +BS, NTND SKIN: warm, dry and intact Neuro: II - XII grossly intact.  Sensation intact Psych: Normal mood and affect.  Appropriate judgement and reason   DVT prophylaxis: WN:[id number] Code Status: DNR  Family Communication: sister updated on the phone today 02/15/20  Status is: inpatient Dispo:   The patient is from: home Anticipated d/c is to: SNF Anticipated d/c date is: whenever bed available Patient currently is medically stable to d/c.   Consultants:   Cardiology.  Procedures:  Antimicrobials:   Data Reviewed: I have personally reviewed following labs and imaging studies  CBC: Recent Labs  Lab 02/09/20 1502 02/10/20 0137 02/11/20 0536 02/12/20 0552 02/13/20 0316 02/14/20 0539 02/15/20 0659  WBC 13.0*   < > 12.3* 11.9* 11.5* 10.2 8.3  NEUTROABS 9.8*  --   --   --   --   --   --   HGB 11.8*   < > 11.5* 10.4* 9.2* 9.4* 9.2*  HCT 35.4*   < > 34.0* 30.7* 28.6* 28.1* 27.4*  MCV 97.8   < > 98.3 97.2 99.7 96.9 97.2  PLT 297   < > 191 174 165 174 185   < > = values in this interval not displayed.   Basic Metabolic Panel: Recent Labs  Lab 02/11/20 0536 02/12/20 0552 02/13/20 0316 02/14/20 0539 02/15/20 0659  NA 137 136 135 136 135  K 4.6 4.4 4.7 4.7 4.9  CL 100 100 100 101 100  CO2 28 27 26 28 26   GLUCOSE 110* 100* 83 86 88  BUN 26* 29* 29* 26* 22  CREATININE 1.11* 1.30* 1.13* 1.06* 1.09*  CALCIUM 8.6* 8.4* 8.3* 8.3* 8.5*  MG 2.2 2.2 2.1 2.1 2.3   GFR: Estimated Creatinine Clearance: 60 mL/min (A) (by C-G formula based on SCr of 1.09 mg/dL (H)). Liver Function Tests: No results for input(s): AST, ALT, ALKPHOS, BILITOT, PROT, ALBUMIN in the last 168 hours. No results for input(s): LIPASE, AMYLASE in the last 168 hours. No results for input(s): AMMONIA in the last 168 hours. Coagulation Profile: Recent Labs  Lab 02/09/20 1742  INR 1.1   Cardiac Enzymes: No  results for input(s): CKTOTAL, CKMB, CKMBINDEX, TROPONINI in the last 168 hours. BNP (last 3 results) No results for input(s): PROBNP in the last 8760 hours. HbA1C: No results for input(s): HGBA1C in the last 72 hours. CBG: Recent Labs  Lab 02/14/20 1132 02/14/20 1656 02/14/20 2141 02/15/20 0733 02/15/20 1155  GLUCAP 108* 79 104* 71 93   Lipid Profile: No results for input(s): CHOL, HDL, LDLCALC, TRIG, CHOLHDL, LDLDIRECT in the last 72 hours. Thyroid Function Tests: No results for input(s): TSH, T4TOTAL, FREET4, T3FREE, THYROIDAB in the last 72 hours. Anemia Panel: No results for input(s): VITAMINB12, FOLATE, FERRITIN, TIBC, IRON, RETICCTPCT in the last 72 hours. Sepsis Labs: No results for input(s): PROCALCITON, LATICACIDVEN in the last 168 hours.  No results found for this or any previous visit (from the past 240 hour(s)).   Radiology Studies: No results found.  Scheduled Meds: . albuterol  2 puff Inhalation QID  .  amiodarone  200 mg Oral BID  . apixaban  5 mg Oral BID  . atorvastatin  20 mg Oral Daily  . feeding supplement (ENSURE ENLIVE)  237 mL Oral TID BM  . insulin aspart  0-15 Units Subcutaneous TID WC  . insulin aspart  0-5 Units Subcutaneous QHS  . insulin detemir  5 Units Subcutaneous QHS  . levothyroxine  200 mcg Oral Q0600  . metFORMIN  1,000 mg Oral BID WC  . mometasone-formoterol  2 puff Inhalation BID  . nicotine  14 mg Transdermal Daily  . PARoxetine  60 mg Oral Daily   Continuous Infusions: . sodium chloride Stopped (02/09/20 1441)     LOS: 15 days    Enzo Bi, MD [facility]  If 7PM-7AM, please contact night-coverage [email or web address]  02/15/2020, 2:24 PM

## 2020-02-15 NOTE — TOC Progression Note (Signed)
Transition of Care [facility]) - Progression Note    Patient Details  Name: Glenda Boone MRN: [id number] Date of Birth: [birth year]  [facility]) CM/SW Contact  Shelbie Hutching, RN Phone Number: 02/15/2020, 12:08 PM  Clinical Narrative:    Patient has been accepted to [facility] for short term rehab.  SNF will start the insurance authorization today.  [facility] was not sure if they could take the patient today but definitely tomorrow.  Patient updated via phone.     Expected Discharge Plan: Home/Self Care Barriers to Discharge: Continued Medical Work up  Expected Discharge Plan and Services Expected Discharge Plan: Home/Self Care   Discharge Planning Services: CM Consult Post Acute Care Choice: NA Living arrangements for the past 2 months: Apartment                 DME Arranged: Walker rolling DME Agency: [employer] Date DME Agency Contacted: 02/02/20 Time DME Agency Contacted: [phone] Representative spoke with at DME Agency: Mardene Celeste HH Arranged: Refused [place]           Social Determinants of Health ([place]) Interventions    Readmission Risk Interventions Readmission Risk Prevention Plan 02/02/2020  Transportation Screening Complete  PCP or Specialist Appt within 3-5 Days Complete  HRI or [employer] Complete  Social Work Consult for [place] Planning/Counseling Complete  Palliative Care Screening Not Applicable  Medication Review Press photographer) Complete  Some recent data might be hidden

## 2020-02-16 MED ORDER — NICOTINE 14 MG/24HR TD PT24: 14.0000 mg | MEDICATED_PATCH | Freq: Every day | TRANSDERMAL | 0 refills | Status: DC

## 2020-02-16 MED ORDER — HYDROCHLOROTHIAZIDE 25 MG PO TABS: ORAL_TABLET | ORAL | Status: DC

## 2020-02-16 MED ORDER — LOSARTAN POTASSIUM 100 MG PO TABS: ORAL_TABLET | ORAL | 1 refills | Status: DC

## 2020-02-16 MED ORDER — APIXABAN 5 MG PO TABS: 5.0000 mg | ORAL_TABLET | Freq: Two times a day (BID) | ORAL | Status: AC

## 2020-02-16 MED ORDER — AMIODARONE HCL 200 MG PO TABS: 200.0000 mg | ORAL_TABLET | Freq: Two times a day (BID) | ORAL | Status: DC

## 2020-02-16 NOTE — Progress Notes (Signed)
Report called to [facility], awaiting first choice for transport

## 2020-02-16 NOTE — TOC Transition Note (Addendum)
Transition of Care [facility]) - CM/SW Discharge Note   Patient Details  Name: Glenda Boone MRN: [id number] Date of Birth: [birth year]  Transition of Care [facility]) CM/SW Contact:  Anselm Pancoast, RN Phone Number: 02/16/2020, 9:56 AM   Clinical Narrative:    Discharge to [facility] via [facility] Room 211-A. Report call to [phone].   Patient scheduled for pick up at 12 Noon.      Barriers to Discharge: Continued Medical Work up   Patient Goals and CMS Choice Patient states their goals for this hospitalization and ongoing recovery are:: To get back home      Discharge Placement                       Discharge Plan and Services   Discharge Planning Services: CM Consult Post Acute Care Choice: NA          DME Arranged: Walker rolling DME Agency: [employer] Date DME Agency Contacted: 02/02/20 Time DME Agency Contacted: [phone] Representative spoke with at DME Agency: Mardene Celeste HH Arranged: Refused [place]          Social Determinants of Health ([place]) Interventions     Readmission Risk Interventions Readmission Risk Prevention Plan 02/02/2020  Transportation Screening Complete  PCP or Specialist Appt within 3-5 Days Complete  HRI or [place] Complete  Social Work Consult for [place] Planning/Counseling Complete  Palliative Care Screening Not Applicable  Medication Review Press photographer) Complete  Some recent data might be hidden

## 2020-02-16 NOTE — Discharge Summary (Signed)
Physician Discharge Summary   Glenda Boone  female DOB: 03-Dec-1950  XBD:[id number]  PCP: [facility] [place] date: 01/30/2020 Discharge date: 02/16/2020  Admitted From: home Disposition:  SNF CODE STATUS: DNR   Hospital Course:  For full details, please see H&P, progress notes, consult notes and ancillary notes.  Briefly,  Glenda Woodfin Fingeris a 69 y.o.femalewith medical history significant forCOPD, diabetes mellitus, hypertension, morbid obesity, depression/anxiety,hypothyroidism who presented by EMS for shortness of breath and cough. Tested positive with Covid on 01/23/20. Patient is unvaccinated.  Admitted with acute respiratory failure secondary to COVID-19 pneumonia.  Acute hypoxic respiratory failure secondary to COVID-19 pneumonia. Presented with 89% O2 sat on RA, placed on 3L [place].  Patient saturating well on RA now. Elevated inflammatory markers which had trended down to normal level after a course of steroid.  Pt completed Remdesivir.  Respiratory symptoms resolved prior to discharge.  # New onset symptomatic A. fib with RVR.  Likely contributed by hyperthyroidism  # Acquired thrombophilia Sudden onset new A. fib with RVR on 9/16.  Patient became quite symptomatic with hypotension, dizziness, having some chest discomfort and palpitations.  Blood pressure in low 70s.  Cardiology consulted.  Rate controlled after amiodarone bolus and gtt, and now transitioned to oral amiodarone 200 mg BID.  Pt was started on heparin gtt then transitioned to eliquis.  Home ASA held by cardiology while on Eliquis.  TSH, came back low at 0.05, checked T4, came back high at 3.24, so pt's home Synthroid 275 mcg daily was held for several days, and resumed at reduced dose of 200 mcg daily prior to discharge.    Essential hypertension.  Pt developed symptomatic A. fib with RVR and became hypotensive.  Home HCTZ and losartan were held, and continued to be held at discharge due  to intermittent low BP.    Hx of Hypothyroidism Hyperthyroidism likely from too much Synthroid TSH came back low at 0.05, checked T4, came back high at 3.24.  Home Synthroid held (on 275 mcg daily at home) for several days and resumed at 200 mcg daily prior to discharge.  Pt will need to follow up with outpatient provider to monitor TSH and adjust Synthroid accordingly.  Type 2 diabetes mellitus.  A1c 5.9 a month ago.  BG have mostly been <100.  No need for fingersticks and insulin.  continued home metformin.  AKI, ruled out Cr peaked at 1.24.  Baseline around 1.  Does not meet criteria for AKI  History of depression.  No acute concern.  Continued home dose of Paxil.  Currently smoker 0.5-1 pack per day PTA.  nicotine patch prescribed.  COPD Stable.  continued home Dulera  Morbid obesity. Body mass index is 49.38 kg/m.  This complicates overall prognosis.  Physical deconditioning. PT is recommending SNF placement.    Discharge Diagnoses:  Principal Problem:   Pneumonia due to COVID-19 virus Active Problems:   Depression, major, recurrent, moderate (HCC)   COPD (chronic obstructive pulmonary disease) (HCC)   Essential hypertension   Hypoxia   Obesity, Class III, BMI 40-49.9 (morbid obesity) ([place])   Acute respiratory failure with hypoxia (HCC)   Type 2 diabetes mellitus with hyperlipidemia (HCC)   Hypothyroidism   Atrial fibrillation with rapid ventricular response (HCC)   Hyperthyroidism   Acquired thrombophilia ([place])   Current use of long term anticoagulation   Current every day smoker    Discharge Instructions:  Allergies as of 02/16/2020      Reactions  Codeine    Shellfish Allergy Hives   Demerol [meperidine Hcl] Anxiety   Gabapentin Other (See Comments)   Gives nightmares      Medication List    STOP taking these medications   Advair Diskus 250-50 MCG/DOSE Aepb Generic drug: Fluticasone-Salmeterol   doxycycline 100 MG tablet Commonly  known as: VIBRA-TABS     TAKE these medications   albuterol 108 (90 Base) MCG/ACT inhaler Commonly known as: VENTOLIN HFA Inhale 2 puffs into the lungs every 6 (six) hours as needed for wheezing or shortness of breath.   amiodarone 200 MG tablet Commonly known as: PACERONE Take 1 tablet (200 mg total) by mouth 2 (two) times daily.   apixaban 5 MG Tabs tablet Commonly known as: ELIQUIS Take 1 tablet (5 mg total) by mouth 2 (two) times daily.   hydrochlorothiazide 25 MG tablet Commonly known as: HYDRODIURIL Held until outpatient followup due to intermittent low blood pressure. What changed:   how much to take  how to take this  when to take this  additional instructions   levothyroxine 200 MCG tablet Commonly known as: SYNTHROID Take 200 mcg by mouth daily before breakfast. (take with 80mcg tablet to equal total of 274mcg daily) What changed: Another medication with the same name was removed. Continue taking this medication, and follow the directions you see here.   losartan 100 MG tablet Commonly known as: COZAAR Held until outpatient followup due to intermittent low blood pressure. What changed:   how much to take  how to take this  when to take this  additional instructions   metFORMIN 500 MG 24 hr tablet Commonly known as: GLUCOPHAGE-XR TAKE 2 TABLETS BY MOUTH TWICE DAILY FOR DIABETES   mometasone-formoterol 200-5 MCG/ACT Aero Commonly known as: DULERA Inhale 2 puffs into the lungs 2 (two) times daily.   nicotine 14 mg/24hr patch Commonly known as: NICODERM CQ - dosed in mg/24 hours Place 1 patch (14 mg total) onto the skin daily. Start taking on: February 17, 2020   PARoxetine 20 MG tablet Commonly known as: PAXIL Take 3 tablets (60 mg total) by mouth daily.   simvastatin 40 MG tablet Commonly known as: ZOCOR Take 40 mg by mouth daily.            Durable Medical Equipment  (From admission, onward)         Start     Ordered   02/02/20  1728  For home use only DME Walker rolling  Once       Question Answer Comment  Walker: With [place] Wheels   Patient needs a walker to treat with the following condition COVID-19      02/02/20 1728           Contact information for follow-up providers    [facility] [employer]. Schedule an appointment as soon as possible for a visit in 1 week(s).   Contact information: [place] [address] [phone]        Teodoro Spray, MD. Schedule an appointment as soon as possible for a visit in 1 month(s).   Specialty: Cardiology Contact information: [place] [address] [phone]            Contact information for after-discharge care    Destination    [facility] .   Service: Skilled Chiropodist information: [address] [place] [phone]  Allergies  Allergen Reactions  . Codeine   . Shellfish Allergy Hives  . Demerol [Meperidine Hcl] Anxiety  . Gabapentin Other (See Comments)    Gives nightmares     The results of significant diagnostics from this hospitalization (including imaging, microbiology, ancillary and laboratory) are listed below for reference.   Consultations:   Procedures/Studies: DG Chest 2 View  Result Date: 01/30/2020 CLINICAL DATA:  Shortness of breath.  COVID positive. EXAM: CHEST - 2 VIEW COMPARISON:  Radiograph 12/30/2019 FINDINGS: Chronic elevation of right hemidiaphragm. Upper normal heart size with unchanged mediastinal contours. Aortic atherosclerosis. Patchy heterogeneous bilateral airspace opacities in a mid-lower lung zone predominant distribution, left greater than right. No pneumothorax or large pleural effusion. Scoliotic curvature of spine. No acute osseous abnormalities are seen. IMPRESSION: Patchy heterogeneous bilateral airspace opacities in a mid-lower lung zone predominant distribution, left greater than  right, consistent with COVID-19 pneumonia. Electronically Signed   By: Keith Rake M.D.   On: 01/30/2020 17:57   ECHOCARDIOGRAM COMPLETE  Result Date: 02/10/2020    ECHOCARDIOGRAM REPORT   Patient Name:   XZANDRIA CLEVINGER Geppert Date of Exam: 02/10/2020 Medical Rec #:  [id number]       Height:       62.0 in Accession #:    [id number]      Weight:       270.0 lb Date of Birth:  [birth year]       BSA:          2.172 m Patient Age:    37 years        BP:           142/93 mmHg Patient Gender: F               HR:           71 bpm. Exam Location:  [facility] Procedure: 2D Echo, Cardiac Doppler and Color Doppler Indications:     Atrial Fibrillation 427.31  History:         Patient has prior history of Echocardiogram examinations, most                  recent 03/10/2019. COPD, Signs/Symptoms:Dyspnea; Risk                  Factors:Hypertension and Diabetes.  Sonographer:     Sherrie Sport RDCS (AE) Referring Phys:  [place] Diagnosing Phys: Bartholome Bill MD  Sonographer Comments: Suboptimal parasternal window. IMPRESSIONS  1. Left ventricular ejection fraction, by estimation, is 55 to 60%. The left ventricle has normal function. The left ventricle has no regional wall motion abnormalities. There is moderate left ventricular hypertrophy. Left ventricular diastolic parameters are consistent with Grade I diastolic dysfunction (impaired relaxation).  2. Right ventricular systolic function is normal. The right ventricular size is normal.  3. Left atrial size was mildly dilated.  4. Right atrial size was mildly dilated.  5. The mitral valve is grossly normal. No evidence of mitral valve regurgitation.  6. The aortic valve was not well visualized. Aortic valve regurgitation is not visualized. FINDINGS  Left Ventricle: Left ventricular ejection fraction, by estimation, is 55 to 60%. The left ventricle has normal function. The left ventricle has no regional wall motion abnormalities. The left ventricular internal cavity size was  normal in size. There is  moderate left ventricular hypertrophy. Left ventricular diastolic parameters are consistent with Grade I diastolic dysfunction (impaired relaxation). Right Ventricle: The right ventricular size is normal. No increase in  right ventricular wall thickness. Right ventricular systolic function is normal. Left Atrium: Left atrial size was mildly dilated. Right Atrium: Right atrial size was mildly dilated. Pericardium: There is no evidence of pericardial effusion. Mitral Valve: The mitral valve is grossly normal. No evidence of mitral valve regurgitation. Tricuspid Valve: The tricuspid valve is not well visualized. Tricuspid valve regurgitation is trivial. Aortic Valve: The aortic valve was not well visualized. Aortic valve regurgitation is not visualized. Aortic valve mean gradient measures 4.0 mmHg. Aortic valve peak gradient measures 7.6 mmHg. Aortic valve area, by VTI measures 2.56 cm. Pulmonic Valve: The pulmonic valve was not well visualized. Pulmonic valve regurgitation is not visualized. Aorta: The aortic root is normal in size and structure. IAS/Shunts: The interatrial septum was not assessed.  LEFT VENTRICLE PLAX 2D LVIDd:         3.35 cm LVIDs:         2.35 cm LV PW:         1.16 cm LV IVS:        1.35 cm LVOT diam:     2.00 cm LV SV:         53 LV SV Index:   25 LVOT Area:     3.14 cm  RIGHT VENTRICLE RV S prime:     21.10 cm/s TAPSE (M-mode): 3.0 cm LEFT ATRIUM              Index       RIGHT ATRIUM           Index LA diam:        4.30 cm  1.98 cm/m  RA Area:     17.00 cm LA Vol (A2C):   49.5 ml  22.79 ml/m RA Volume:   46.80 ml  21.55 ml/m LA Vol (A4C):   101.0 ml 46.51 ml/m LA Biplane Vol: 77.4 ml  35.64 ml/m  AORTIC VALVE                   PULMONIC VALVE AV Area (Vmax):    2.89 cm    PV Vmax:        0.76 m/s AV Area (Vmean):   2.81 cm    PV Peak grad:   2.3 mmHg AV Area (VTI):     2.56 cm    RVOT Peak grad: 4 mmHg AV Vmax:           138.00 cm/s AV Vmean:          94.200  cm/s AV VTI:            0.209 m AV Peak Grad:      7.6 mmHg AV Mean Grad:      4.0 mmHg LVOT Vmax:         127.00 cm/s LVOT Vmean:        84.300 cm/s LVOT VTI:          0.170 m LVOT/AV VTI ratio: 0.81 MITRAL VALVE                TRICUSPID VALVE MV Area (PHT): 3.11 cm     TR Peak grad:   9.2 mmHg MV Decel Time: 244 msec     TR Vmax:        152.00 cm/s MV E velocity: 124.00 cm/s                             SHUNTS  Systemic VTI:  0.17 m                             Systemic Diam: 2.00 cm Bartholome Bill MD Electronically signed by Bartholome Bill MD Signature Date/Time: 02/10/2020/12:27:59 PM    Final       Labs: BNP (last 3 results) Recent Labs    12/31/19 0450 01/31/20 0154  BNP 43.9 45.4   Basic Metabolic Panel: Recent Labs  Lab 02/11/20 0536 02/12/20 0552 02/13/20 0316 02/14/20 0539 02/15/20 0659  NA 137 136 135 136 135  K 4.6 4.4 4.7 4.7 4.9  CL 100 100 100 101 100  CO2 28 27 26 28 26   GLUCOSE 110* 100* 83 86 88  BUN 26* 29* 29* 26* 22  CREATININE 1.11* 1.30* 1.13* 1.06* 1.09*  CALCIUM 8.6* 8.4* 8.3* 8.3* 8.5*  MG 2.2 2.2 2.1 2.1 2.3   Liver Function Tests: No results for input(s): AST, ALT, ALKPHOS, BILITOT, PROT, ALBUMIN in the last 168 hours. No results for input(s): LIPASE, AMYLASE in the last 168 hours. No results for input(s): AMMONIA in the last 168 hours. CBC: Recent Labs  Lab 02/09/20 1502 02/10/20 0137 02/11/20 0536 02/12/20 0552 02/13/20 0316 02/14/20 0539 02/15/20 0659  WBC 13.0*   < > 12.3* 11.9* 11.5* 10.2 8.3  NEUTROABS 9.8*  --   --   --   --   --   --   HGB 11.8*   < > 11.5* 10.4* 9.2* 9.4* 9.2*  HCT 35.4*   < > 34.0* 30.7* 28.6* 28.1* 27.4*  MCV 97.8   < > 98.3 97.2 99.7 96.9 97.2  PLT 297   < > 191 174 165 174 185   < > = values in this interval not displayed.   Cardiac Enzymes: No results for input(s): CKTOTAL, CKMB, CKMBINDEX, TROPONINI in the last 168 hours. BNP: Invalid input(s): POCBNP CBG: Recent Labs  Lab  02/14/20 2141 02/15/20 0733 02/15/20 1155 02/15/20 1605 02/15/20 2054  GLUCAP 104* 71 93 70 78   D-Dimer No results for input(s): DDIMER in the last 72 hours. Hgb A1c No results for input(s): HGBA1C in the last 72 hours. Lipid Profile No results for input(s): CHOL, HDL, LDLCALC, TRIG, CHOLHDL, LDLDIRECT in the last 72 hours. Thyroid function studies No results for input(s): TSH, T4TOTAL, T3FREE, THYROIDAB in the last 72 hours.  Invalid input(s): FREET3 Anemia work up No results for input(s): VITAMINB12, FOLATE, FERRITIN, TIBC, IRON, RETICCTPCT in the last 72 hours. Urinalysis    Component Value Date/Time   COLORURINE YELLOW (A) 12/30/2019 1257   APPEARANCEUR HAZY (A) 12/30/2019 1257   LABSPEC 1.018 12/30/2019 1257   PHURINE 5.0 12/30/2019 1257   GLUCOSEU NEGATIVE 12/30/2019 1257   HGBUR NEGATIVE 12/30/2019 1257   BILIRUBINUR NEGATIVE 12/30/2019 [place] 12/30/2019 1257   PROTEINUR NEGATIVE 12/30/2019 1257   NITRITE NEGATIVE 12/30/2019 1257   LEUKOCYTESUR NEGATIVE 12/30/2019 1257   Sepsis Labs Invalid input(s): PROCALCITONIN,  WBC,  LACTICIDVEN Microbiology No results found for this or any previous visit (from the past 240 hour(s)).   Total time spend on discharging this patient, including the last patient exam, discussing the hospital stay, instructions for ongoing care as it relates to all pertinent caregivers, as well as preparing the medical discharge records, prescriptions, and/or referrals as applicable, is 30 minutes.    Enzo Bi, MD  [facility] 02/16/2020, 10:59 AM  If 7PM-7AM, please contact night-coverage

## 2020-12-01 IMAGING — CR DG CHEST 2V
1 series · 2 of 2 positions shown · non-contrast
Comparison: Radiograph 12/30/2019

CLINICAL DATA: Shortness of breath.  COVID positive.

EXAM:
CHEST - 2 VIEW

[Series 1: dg chest 2 view · 0.14mm/px · 2 of 2 slices shown]
[im 1/2]
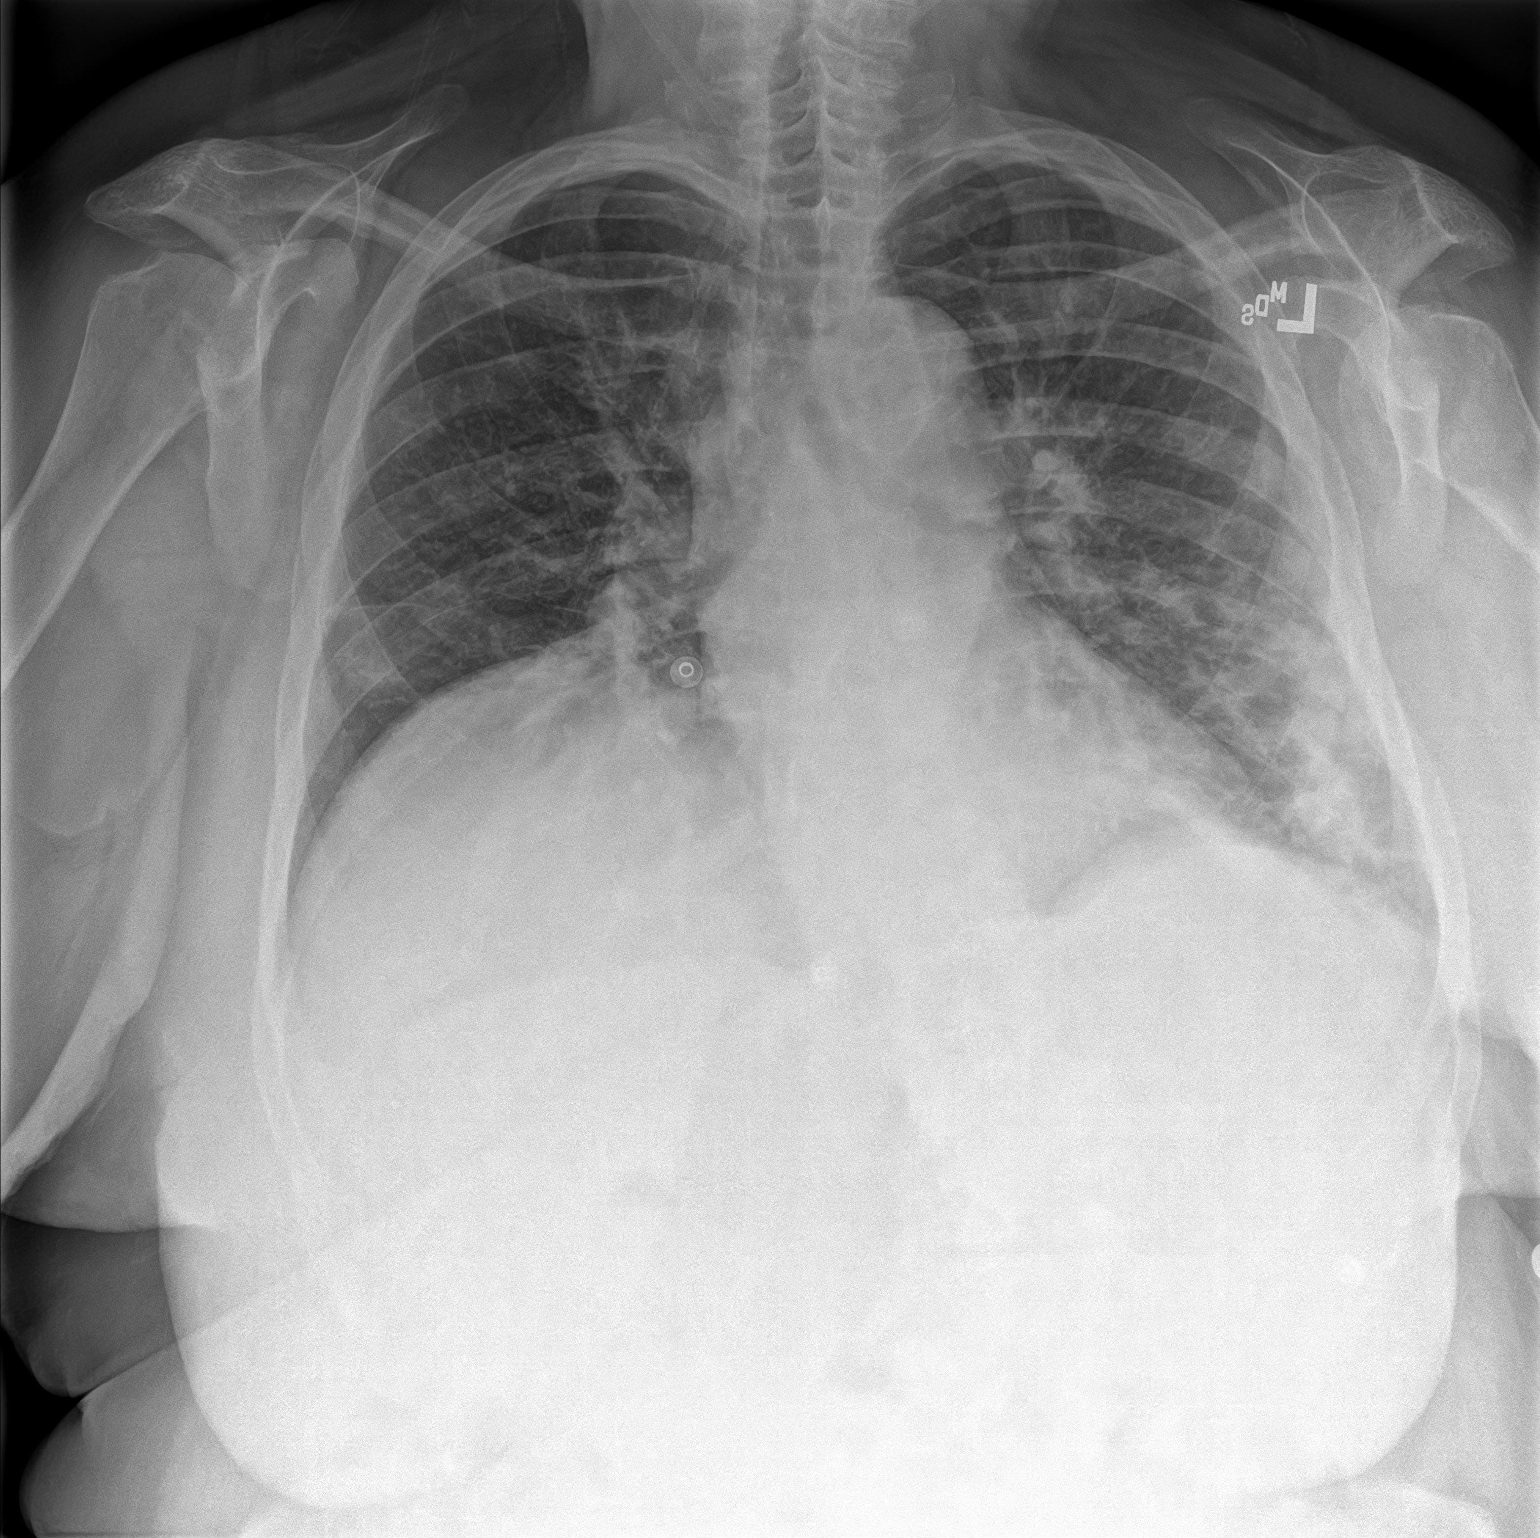
[im 2/2]
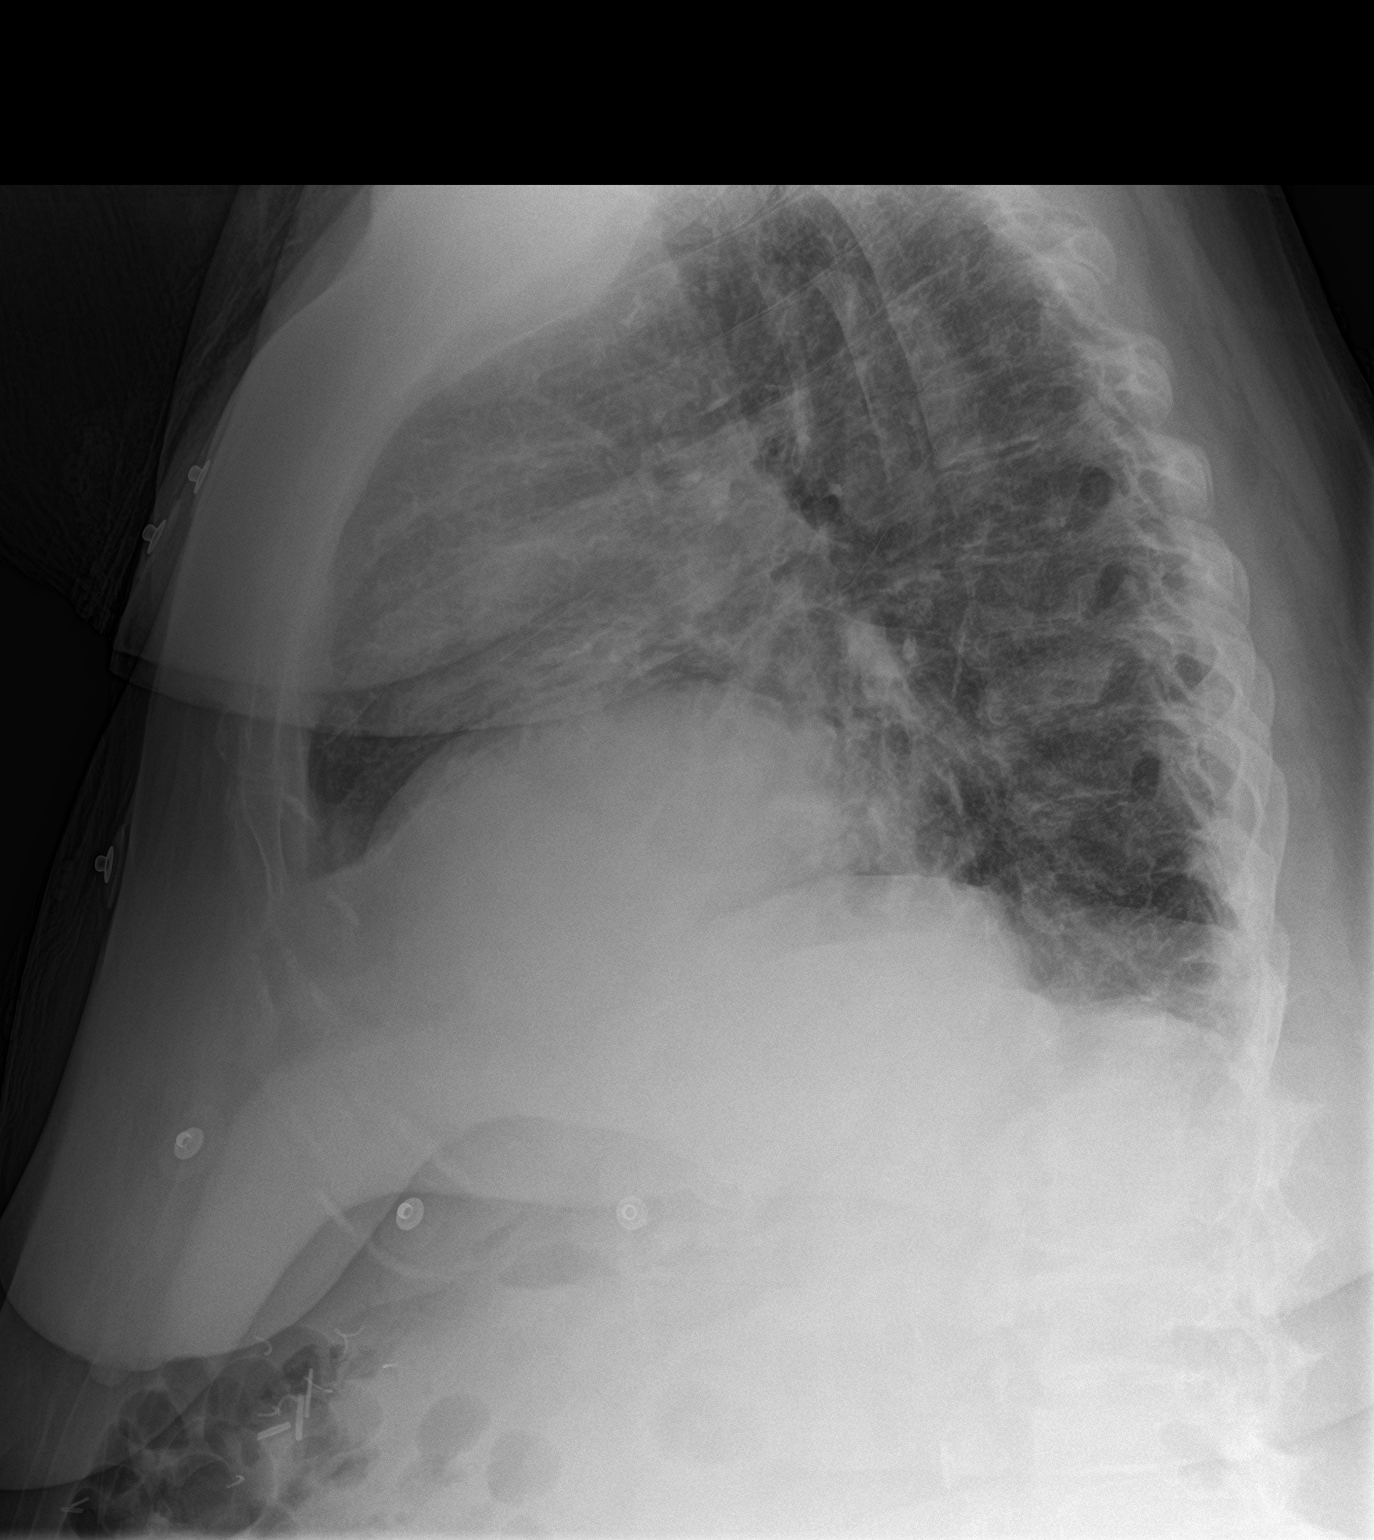

[2 of 2 positions shown; findings below may reference images not displayed]

FINDINGS: Chronic elevation of right hemidiaphragm. Upper normal heart size
with unchanged mediastinal contours. Aortic atherosclerosis. Patchy
heterogeneous bilateral airspace opacities in a mid-lower lung zone
predominant distribution, left greater than right. No pneumothorax
or large pleural effusion. Scoliotic curvature of spine. No acute
osseous abnormalities are seen.
IMPRESSION: Patchy heterogeneous bilateral airspace opacities in a mid-lower
lung zone predominant distribution, left greater than right,
consistent with [id number] pneumonia.

## 2021-01-28 ENCOUNTER — Emergency Department (HOSPITAL_COMMUNITY)
Admission: EM | Admit: 2021-01-28 | Discharge: 2021-01-28 | Disposition: A | Discharge status: Home / Self Care | Payer: Medicare HMO | Attending: Emergency Medicine | Admitting: Emergency Medicine

## 2021-01-28 ENCOUNTER — Encounter (HOSPITAL_COMMUNITY): Payer: Self-pay

## 2021-01-28 ENCOUNTER — Other Ambulatory Visit: Payer: Self-pay

## 2021-01-28 ENCOUNTER — Emergency Department (HOSPITAL_COMMUNITY): Payer: Medicare HMO

## 2021-01-28 DIAGNOSIS — I4891 Unspecified atrial fibrillation: Secondary | ICD-10-CM | POA: Diagnosis not present

## 2021-01-28 DIAGNOSIS — Z8616 Personal history of COVID-19: Secondary | ICD-10-CM | POA: Diagnosis not present

## 2021-01-28 DIAGNOSIS — Z7984 Long term (current) use of oral hypoglycemic drugs: Secondary | ICD-10-CM | POA: Diagnosis not present

## 2021-01-28 DIAGNOSIS — R110 Nausea: Secondary | ICD-10-CM | POA: Insufficient documentation

## 2021-01-28 DIAGNOSIS — R0781 Pleurodynia: Secondary | ICD-10-CM | POA: Diagnosis not present

## 2021-01-28 DIAGNOSIS — E039 Hypothyroidism, unspecified: Secondary | ICD-10-CM | POA: Diagnosis not present

## 2021-01-28 DIAGNOSIS — E119 Type 2 diabetes mellitus without complications: Secondary | ICD-10-CM | POA: Diagnosis not present

## 2021-01-28 DIAGNOSIS — I10 Essential (primary) hypertension: Secondary | ICD-10-CM | POA: Diagnosis not present

## 2021-01-28 DIAGNOSIS — Z7901 Long term (current) use of anticoagulants: Secondary | ICD-10-CM | POA: Diagnosis not present

## 2021-01-28 DIAGNOSIS — Z7951 Long term (current) use of inhaled steroids: Secondary | ICD-10-CM | POA: Insufficient documentation

## 2021-01-28 DIAGNOSIS — F17210 Nicotine dependence, cigarettes, uncomplicated: Secondary | ICD-10-CM | POA: Insufficient documentation

## 2021-01-28 DIAGNOSIS — R059 Cough, unspecified: Secondary | ICD-10-CM | POA: Insufficient documentation

## 2021-01-28 DIAGNOSIS — Z8543 Personal history of malignant neoplasm of ovary: Secondary | ICD-10-CM | POA: Insufficient documentation

## 2021-01-28 DIAGNOSIS — Z79899 Other long term (current) drug therapy: Secondary | ICD-10-CM | POA: Diagnosis not present

## 2021-01-28 DIAGNOSIS — J449 Chronic obstructive pulmonary disease, unspecified: Secondary | ICD-10-CM | POA: Insufficient documentation

## 2021-01-28 LAB — CBC
HCT: 37.7 % (ref 36.0–46.0)
Hemoglobin: 11.9 g/dL — ABNORMAL LOW (ref 12.0–15.0)
MCH: 31.1 pg (ref 26.0–34.0)
MCHC: 31.6 g/dL (ref 30.0–36.0)
MCV: 98.4 fL (ref 80.0–100.0)
Platelets: 210 10*3/uL (ref 150–400)
RBC: 3.83 MIL/uL — ABNORMAL LOW (ref 3.87–5.11)
RDW: 13.3 % (ref 11.5–15.5)
WBC: 10.2 10*3/uL (ref 4.0–10.5)
nRBC: 0 % (ref 0.0–0.2)

## 2021-01-28 LAB — BASIC METABOLIC PANEL
Anion gap: 6 (ref 5–15)
BUN: 17 mg/dL (ref 8–23)
CO2: 29 mmol/L (ref 22–32)
Calcium: 9.1 mg/dL (ref 8.9–10.3)
Chloride: 103 mmol/L (ref 98–111)
Creatinine, Ser: 1.24 mg/dL — ABNORMAL HIGH (ref 0.44–1.00)
GFR, Estimated: 47 mL/min — ABNORMAL LOW (ref >60)
Glucose, Bld: 112 mg/dL — ABNORMAL HIGH (ref 70–99)
Potassium: 3.9 mmol/L (ref 3.5–5.1)
Sodium: 138 mmol/L (ref 135–145)

## 2021-01-28 LAB — TROPONIN I (HIGH SENSITIVITY)
Troponin I (High Sensitivity): 13 ng/L (ref <18)
Troponin I (High Sensitivity): 15 ng/L (ref <18)

## 2021-01-28 MED ORDER — FENTANYL CITRATE PF 50 MCG/ML IJ SOSY
50.0000 ug | PREFILLED_SYRINGE | Freq: Once | INTRAMUSCULAR | Status: AC
  Administered 2021-01-28: 50 ug via INTRAVENOUS
  Filled 2021-01-28: qty 1

## 2021-01-28 MED ORDER — CYCLOBENZAPRINE HCL 10 MG PO TABS: 10.0000 mg | ORAL_TABLET | Freq: Three times a day (TID) | ORAL | 0 refills | Status: DC | PRN

## 2021-01-28 NOTE — ED Notes (Signed)
Checked with Herbie Baltimore PA re: SBP 200s/ meds prior to discharge. Per PA, okay to dc, and pt may resume her home meds.

## 2021-01-28 NOTE — Discharge Instructions (Addendum)
Your x-ray revealed old healed rib fractures.  This involves your eighth and ninth ribs.  There were no new fractures.  There was no evidence of infection in your lungs.  Your heart markers were reassuring.  I suspect that your pain came from straining of the chest wall while using your wheelchair.  You can apply ice and heat, use Tylenol, Motrin, and the muscle relaxer as needed.  Please follow-up with your regular doctor.

## 2021-01-28 NOTE — ED Notes (Signed)
Pt in x-ray at this time

## 2021-01-28 NOTE — ED Notes (Signed)
Patient ambulated to bathroom with one assist.

## 2021-01-28 NOTE — ED Triage Notes (Signed)
Pt from [facility] with ems c.o left sided rib/axillary pain for the past 5 days. Pain tender on palpation. Hx of fall 6 months ago and fell onto that side. Pt also c.o nausea for the past 5 days.  HX of COPD, denies SOB  BP 192/110

## 2021-01-28 NOTE — ED Provider Notes (Signed)
[facility] EMERGENCY DEPARTMENT Provider Note   CSN: [id number] Arrival date & time: 01/28/21  [id number]     History Chief Complaint  Patient presents with   Chest Pain   Nausea    Glenda Boone is a 70 y.o. female.  Patient presents to the emergency department with a chief complaint of left-sided rib pain.  She states that she has had the pain for the past 5 days or so.  Reports that it has gradually worsened.  She reports mild associated nausea, but no vomiting.  She states that she had a fall approximately 6 months ago, and thinks that she may have broken a rib.  She denies any recent trauma, but states that she may have exacerbated her symptoms by using her wheelchair.  She reports slight cough, but denies any shortness of breath.  She denies any fevers or chills.  Denies any successful treatments prior to arrival.  She was noted to have elevated blood pressure by EMS and in triage, but patient states she took her blood pressure medication immediately prior to arrival.  She denies any other associated symptoms today.  The history is provided by the patient. No language interpreter was used.      Past Medical History:  Diagnosis Date   Anxiety    Bronchitis    COPD (chronic obstructive pulmonary disease) ([place])    Depression    Diabetes mellitus, type II ([place])    Dyspnea    GERD (gastroesophageal reflux disease)    Hyperlipidemia    Hypertension    Hypothyroidism    Neuropathy    Ovarian cancer ([place])    Shingles    Thyroid disease     Patient Active Problem List   Diagnosis Date Noted   Acquired thrombophilia ([place]) 02/12/2020   Current use of long term anticoagulation 02/12/2020   Current every day smoker 02/12/2020   Atrial fibrillation with rapid ventricular response ([place]) 02/11/2020   Hyperthyroidism 02/11/2020   Pneumonia due to COVID-19 virus 01/31/2020   COPD (chronic obstructive pulmonary disease) ([place]) 01/31/2020   Essential hypertension  01/31/2020   Hypoxia 01/31/2020   Obesity, Class III, BMI 40-49.9 (morbid obesity) ([place]) 01/31/2020   Acute respiratory failure with hypoxia (HCC)    Type 2 diabetes mellitus with hyperlipidemia (HCC)    Hypothyroidism    Acute metabolic encephalopathy [id number]   TIA (transient ischemic attack) 09/04/2019   MDD (major depressive disorder), recurrent, in full remission ([place]) 04/25/2019   Aphagia 03/09/2019   Shingles outbreak 06/21/2015   Chronic bronchitis ([place]) 06/21/2015   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     Family History  Problem Relation Age of Onset   Heart Problems Mother    Hypertension Mother    Colon cancer Father    Arthritis-Osteo Sister     Social History   Tobacco Use  Smoking status: Every Day    Packs/day: 1.00    Types: Cigarettes    Start date: 12/19/1979   Smokeless tobacco: Never  Substance Use Topics   Alcohol use: No    Alcohol/week: 0.0 standard drinks   Drug use: No    Home Medications Prior to Admission medications   Medication Sig Start Date End Date Taking? Authorizing Provider  albuterol (VENTOLIN HFA) 108 (90 Base) MCG/ACT inhaler Inhale 2 puffs into the lungs every 6 (six) hours as needed for wheezing or shortness of breath. Patient not taking: Reported on 01/31/2020 01/01/20   Mercy Riding, MD  amiodarone (PACERONE) 200 MG tablet Take 1 tablet (200 mg total) by mouth 2  (two) times daily. 02/16/20   Enzo Bi, MD  apixaban (ELIQUIS) 5 MG TABS tablet Take 1 tablet (5 mg total) by mouth 2 (two) times daily. 02/16/20   Enzo Bi, MD  hydrochlorothiazide (HYDRODIURIL) 25 MG tablet Held until outpatient followup due to intermittent low blood pressure. 02/16/20   Enzo Bi, MD  levothyroxine (SYNTHROID, LEVOTHROID) 200 MCG tablet Take 200 mcg by mouth daily before breakfast. (take with 76mg tablet to equal total of 2738m daily)    [provider]  losartan (COZAAR) 100 MG tablet Held until outpatient followup due to intermittent low blood pressure. 02/16/20   LaEnzo BiMD  metFORMIN (GLUCOPHAGE-XR) 500 MG 24 hr tablet TAKE 2 TABLETS BY MOUTH TWICE DAILY FOR DIABETES    [provider]  mometasone-formoterol (DULERA) 200-5 MCG/ACT AERO Inhale 2 puffs into the lungs 2 (two) times daily. 01/01/20   GoMercy RidingMD  nicotine (NICODERM CQ - DOSED IN MG/24 HOURS) 14 mg/24hr patch Place 1 patch (14 mg total) onto the skin daily. 02/17/20   LaEnzo BiMD  PARoxetine (PAXIL) 20 MG tablet Take 3 tablets (60 mg total) by mouth daily. 04/25/19   Ka[place] CraneMD  simvastatin (ZOCOR) 40 MG tablet Take 40 mg by mouth daily.    [provider]    Allergies    Codeine, Shellfish allergy, Demerol [meperidine hcl], and Gabapentin  Review of Systems   Review of Systems  All other systems reviewed and are negative.  Physical Exam Updated Vital Signs BP (!) 228/85 (BP Location: Left Arm)   Pulse 74   Temp 98.1 F (36.7 C) (Oral)   Resp (!) 23   Ht '5\' 3"'$  (1.6 m)   Wt 129.3 kg   SpO2 97%   BMI 50.49 kg/m   Physical Exam Vitals and nursing note reviewed.  Constitutional:      General: She is not in acute distress.    Appearance: She is well-developed.  HENT:     Head: Normocephalic and atraumatic.  Eyes:     Conjunctiva/sclera: Conjunctivae normal.  Cardiovascular:     Rate and Rhythm: Normal rate and regular rhythm.     Heart sounds: No  murmur heard. Pulmonary:     Effort: Pulmonary effort is normal. No respiratory distress.     Breath sounds: Normal breath sounds.     Comments: Left inferior ribs are tender to palpation, no crepitus, no obvious deformity Abdominal:     Palpations: Abdomen is soft.     Tenderness: There is no abdominal tenderness.  Musculoskeletal:     Cervical back: Neck supple.  Skin:    General: Skin is warm and dry.     Comments: No rash  Neurological:     Mental Status: She is alert and oriented to  person, place, and time.  Psychiatric:        Mood and Affect: Mood normal.        Behavior: Behavior normal.    ED Results / Procedures / Treatments   Labs (all labs ordered are listed, but only abnormal results are displayed) Labs Reviewed  BASIC METABOLIC PANEL  CBC  TROPONIN I (HIGH SENSITIVITY)    EKG None  Radiology No results found.  Procedures Procedures   Medications Ordered in ED Medications  fentaNYL (SUBLIMAZE) injection 50 mcg (has no administration in time range)    ED Course  I have reviewed the triage vital signs and the nursing notes.  Pertinent labs & imaging results that were available during my care of the patient were reviewed by me and considered in my medical decision making (see chart for details).    MDM Rules/Calculators/A&P                          Patient here with left-sided rib pain.  Onset 5 days ago.  She is tender to palpation.  Remote trauma, question rib fracture/intercostal strain.  Will check labs and cardiac enzymes to rule out ACS/anginal equivalent.  Patient is a DNR.  Troponins are 13 and 15.  Doubt ACS.  EKG shows no ischemic changes.  Chest x-ray shows old rib fractures which have now healed.  Patient feels improved after some pain medicine in the ED.  Given her reassuring work-up, and that the patient's pain is easily reproducible with palpation, suspect that this is musculoskeletal pain and that the patient can be safely  discharged from the emergency department.  She is in agreement and understands her discharge plan.  She is stable for discharge. Final Clinical Impression(s) / ED Diagnoses Final diagnoses:  Rib pain on left side    Rx / DC Orders ED Discharge Orders          Ordered    cyclobenzaprine (FLEXERIL) 10 MG tablet  3 times daily PRN        01/28/21 1459             Montine Circle, PA-C 01/28/21 1502    Luna Fuse, MD 01/28/21 1526

## 2022-03-28 ENCOUNTER — Inpatient Hospital Stay (HOSPITAL_COMMUNITY)
Admission: EM | Admit: 2022-03-28 | Discharge: 2022-04-03 | DRG: 190 | Disposition: A | Discharge status: Skilled Nursing Facility | Payer: Medicare HMO | Source: Skilled Nursing Facility | Attending: Internal Medicine | Admitting: Internal Medicine

## 2022-03-28 ENCOUNTER — Encounter (HOSPITAL_COMMUNITY): Payer: Self-pay | Admitting: Emergency Medicine

## 2022-03-28 ENCOUNTER — Emergency Department (HOSPITAL_COMMUNITY): Payer: Medicare HMO

## 2022-03-28 ENCOUNTER — Inpatient Hospital Stay (HOSPITAL_COMMUNITY): Payer: Medicare HMO

## 2022-03-28 ENCOUNTER — Other Ambulatory Visit: Payer: Self-pay

## 2022-03-28 DIAGNOSIS — I4891 Unspecified atrial fibrillation: Secondary | ICD-10-CM | POA: Diagnosis present

## 2022-03-28 DIAGNOSIS — Z79890 Hormone replacement therapy (postmenopausal): Secondary | ICD-10-CM

## 2022-03-28 DIAGNOSIS — D539 Nutritional anemia, unspecified: Secondary | ICD-10-CM | POA: Diagnosis present

## 2022-03-28 DIAGNOSIS — I130 Hypertensive heart and chronic kidney disease with heart failure and stage 1 through stage 4 chronic kidney disease, or unspecified chronic kidney disease: Secondary | ICD-10-CM | POA: Diagnosis present

## 2022-03-28 DIAGNOSIS — Z800 Family history of malignant neoplasm of digestive organs: Secondary | ICD-10-CM

## 2022-03-28 DIAGNOSIS — E039 Hypothyroidism, unspecified: Secondary | ICD-10-CM | POA: Diagnosis present

## 2022-03-28 DIAGNOSIS — F419 Anxiety disorder, unspecified: Secondary | ICD-10-CM | POA: Diagnosis present

## 2022-03-28 DIAGNOSIS — Z20822 Contact with and (suspected) exposure to covid-19: Secondary | ICD-10-CM | POA: Diagnosis present

## 2022-03-28 DIAGNOSIS — E66813 Obesity, class 3: Secondary | ICD-10-CM | POA: Diagnosis present

## 2022-03-28 DIAGNOSIS — N179 Acute kidney failure, unspecified: Secondary | ICD-10-CM | POA: Diagnosis present

## 2022-03-28 DIAGNOSIS — Z888 Allergy status to other drugs, medicaments and biological substances status: Secondary | ICD-10-CM

## 2022-03-28 DIAGNOSIS — N390 Urinary tract infection, site not specified: Secondary | ICD-10-CM | POA: Diagnosis present

## 2022-03-28 DIAGNOSIS — F17210 Nicotine dependence, cigarettes, uncomplicated: Secondary | ICD-10-CM | POA: Diagnosis present

## 2022-03-28 DIAGNOSIS — E785 Hyperlipidemia, unspecified: Secondary | ICD-10-CM | POA: Diagnosis present

## 2022-03-28 DIAGNOSIS — N1832 Chronic kidney disease, stage 3b: Secondary | ICD-10-CM | POA: Diagnosis present

## 2022-03-28 DIAGNOSIS — R609 Edema, unspecified: Secondary | ICD-10-CM | POA: Diagnosis not present

## 2022-03-28 DIAGNOSIS — R0609 Other forms of dyspnea: Secondary | ICD-10-CM

## 2022-03-28 DIAGNOSIS — E662 Morbid (severe) obesity with alveolar hypoventilation: Secondary | ICD-10-CM | POA: Diagnosis present

## 2022-03-28 DIAGNOSIS — R0600 Dyspnea, unspecified: Principal | ICD-10-CM

## 2022-03-28 DIAGNOSIS — E1169 Type 2 diabetes mellitus with other specified complication: Secondary | ICD-10-CM | POA: Diagnosis present

## 2022-03-28 DIAGNOSIS — Z79899 Other long term (current) drug therapy: Secondary | ICD-10-CM

## 2022-03-28 DIAGNOSIS — K219 Gastro-esophageal reflux disease without esophagitis: Secondary | ICD-10-CM | POA: Diagnosis present

## 2022-03-28 DIAGNOSIS — Z8543 Personal history of malignant neoplasm of ovary: Secondary | ICD-10-CM

## 2022-03-28 DIAGNOSIS — F32A Depression, unspecified: Secondary | ICD-10-CM | POA: Diagnosis present

## 2022-03-28 DIAGNOSIS — Z885 Allergy status to narcotic agent status: Secondary | ICD-10-CM | POA: Diagnosis not present

## 2022-03-28 DIAGNOSIS — I872 Venous insufficiency (chronic) (peripheral): Secondary | ICD-10-CM | POA: Diagnosis present

## 2022-03-28 DIAGNOSIS — E1142 Type 2 diabetes mellitus with diabetic polyneuropathy: Secondary | ICD-10-CM | POA: Diagnosis present

## 2022-03-28 DIAGNOSIS — I10 Essential (primary) hypertension: Secondary | ICD-10-CM | POA: Diagnosis present

## 2022-03-28 DIAGNOSIS — E1122 Type 2 diabetes mellitus with diabetic chronic kidney disease: Secondary | ICD-10-CM | POA: Diagnosis present

## 2022-03-28 DIAGNOSIS — J441 Chronic obstructive pulmonary disease with (acute) exacerbation: Secondary | ICD-10-CM | POA: Diagnosis present

## 2022-03-28 DIAGNOSIS — Z5320 Procedure and treatment not carried out because of patient's decision for unspecified reasons: Secondary | ICD-10-CM | POA: Diagnosis present

## 2022-03-28 DIAGNOSIS — Z6841 Body Mass Index (BMI) 40.0 and over, adult: Secondary | ICD-10-CM | POA: Diagnosis not present

## 2022-03-28 DIAGNOSIS — Z9221 Personal history of antineoplastic chemotherapy: Secondary | ICD-10-CM

## 2022-03-28 DIAGNOSIS — Z8249 Family history of ischemic heart disease and other diseases of the circulatory system: Secondary | ICD-10-CM

## 2022-03-28 DIAGNOSIS — Z7901 Long term (current) use of anticoagulants: Secondary | ICD-10-CM

## 2022-03-28 DIAGNOSIS — I5033 Acute on chronic diastolic (congestive) heart failure: Secondary | ICD-10-CM | POA: Diagnosis present

## 2022-03-28 DIAGNOSIS — Z66 Do not resuscitate: Secondary | ICD-10-CM | POA: Diagnosis present

## 2022-03-28 DIAGNOSIS — Z91013 Allergy to seafood: Secondary | ICD-10-CM

## 2022-03-28 LAB — BLOOD GAS, VENOUS
Acid-Base Excess: 0.1 mmol/L (ref 0.0–2.0)
Bicarbonate: 27.9 mmol/L (ref 20.0–28.0)
O2 Saturation: 55.1 %
Patient temperature: 37
pCO2, Ven: 58 mmHg (ref 44–60)
pH, Ven: 7.29 (ref 7.25–7.43)
pO2, Ven: 39 mmHg (ref 32–45)

## 2022-03-28 LAB — CBC
HCT: 34.1 % — ABNORMAL LOW (ref 36.0–46.0)
Hemoglobin: 10.2 g/dL — ABNORMAL LOW (ref 12.0–15.0)
MCH: 30.4 pg (ref 26.0–34.0)
MCHC: 29.9 g/dL — ABNORMAL LOW (ref 30.0–36.0)
MCV: 101.8 fL — ABNORMAL HIGH (ref 80.0–100.0)
Platelets: 266 10*3/uL (ref 150–400)
RBC: 3.35 MIL/uL — ABNORMAL LOW (ref 3.87–5.11)
RDW: 15.8 % — ABNORMAL HIGH (ref 11.5–15.5)
WBC: 8.4 10*3/uL (ref 4.0–10.5)
nRBC: 0 % (ref 0.0–0.2)

## 2022-03-28 LAB — FERRITIN: Ferritin: 52 ng/mL (ref 11–307)

## 2022-03-28 LAB — ECHOCARDIOGRAM COMPLETE
AR max vel: 4.59 cm2
AV Area VTI: 4.43 cm2
AV Area mean vel: 4.27 cm2
AV Mean grad: 6 mmHg
AV Peak grad: 9.4 mmHg
Ao pk vel: 1.53 m/s
Area-P 1/2: 3.08 cm2
Calc EF: 62.9 %
MV VTI: 3.34 cm2
S' Lateral: 3.3 cm
Single Plane A2C EF: 56.5 %
Single Plane A4C EF: 69.3 %

## 2022-03-28 LAB — IRON AND TIBC
Iron: 50 ug/dL (ref 28–170)
Saturation Ratios: 16 % (ref 10.4–31.8)
TIBC: 316 ug/dL (ref 250–450)
UIBC: 266 ug/dL

## 2022-03-28 LAB — RESP PANEL BY RT-PCR (FLU A&B, COVID) ARPGX2
Influenza A by PCR: NEGATIVE
Influenza B by PCR: NEGATIVE
SARS Coronavirus 2 by RT PCR: NEGATIVE

## 2022-03-28 LAB — BRAIN NATRIURETIC PEPTIDE: B Natriuretic Peptide: 57.9 pg/mL (ref 0.0–100.0)

## 2022-03-28 LAB — BASIC METABOLIC PANEL
Anion gap: 8 (ref 5–15)
BUN: 36 mg/dL — ABNORMAL HIGH (ref 8–23)
CO2: 27 mmol/L (ref 22–32)
Calcium: 8.5 mg/dL — ABNORMAL LOW (ref 8.9–10.3)
Chloride: 104 mmol/L (ref 98–111)
Creatinine, Ser: 1.84 mg/dL — ABNORMAL HIGH (ref 0.44–1.00)
GFR, Estimated: 29 mL/min — ABNORMAL LOW (ref >60)
Glucose, Bld: 155 mg/dL — ABNORMAL HIGH (ref 70–99)
Potassium: 4.2 mmol/L (ref 3.5–5.1)
Sodium: 139 mmol/L (ref 135–145)

## 2022-03-28 LAB — TROPONIN I (HIGH SENSITIVITY)
Troponin I (High Sensitivity): 10 ng/L (ref <18)
Troponin I (High Sensitivity): 10 ng/L (ref <18)

## 2022-03-28 LAB — HEMOGLOBIN A1C
Hgb A1c MFr Bld: 7.3 % — ABNORMAL HIGH (ref 4.8–5.6)
Mean Plasma Glucose: 162.81 mg/dL

## 2022-03-28 LAB — GLUCOSE, CAPILLARY
Glucose-Capillary: 221 mg/dL — ABNORMAL HIGH (ref 70–99)
Glucose-Capillary: 299 mg/dL — ABNORMAL HIGH (ref 70–99)

## 2022-03-28 LAB — MAGNESIUM: Magnesium: 2.4 mg/dL (ref 1.7–2.4)

## 2022-03-28 MED ORDER — UMECLIDINIUM BROMIDE 62.5 MCG/ACT IN AEPB
1.0000 | INHALATION_SPRAY | Freq: Every day | RESPIRATORY_TRACT | Status: DC
  Administered 2022-03-29 – 2022-04-03 (×6): 1 via RESPIRATORY_TRACT
  Filled 2022-03-28 (×2): qty 7

## 2022-03-28 MED ORDER — INSULIN ASPART 100 UNIT/ML IJ SOLN
0.0000 [IU] | Freq: Every day | INTRAMUSCULAR | Status: DC
  Administered 2022-03-28 – 2022-03-30 (×2): 3 [IU] via SUBCUTANEOUS

## 2022-03-28 MED ORDER — BUSPIRONE HCL 5 MG PO TABS
10.0000 mg | ORAL_TABLET | Freq: Two times a day (BID) | ORAL | Status: DC
  Administered 2022-03-28 – 2022-04-03 (×13): 10 mg via ORAL
  Filled 2022-03-28 (×6): qty 2
  Filled 2022-03-28: qty 1
  Filled 2022-03-28 (×6): qty 2

## 2022-03-28 MED ORDER — HYDROXYZINE HCL 10 MG PO TABS
10.0000 mg | ORAL_TABLET | Freq: Two times a day (BID) | ORAL | Status: DC | PRN
  Administered 2022-03-29 – 2022-04-02 (×6): 10 mg via ORAL
  Filled 2022-03-28 (×6): qty 1

## 2022-03-28 MED ORDER — ACETAMINOPHEN 650 MG RE SUPP: 650.0000 mg | Freq: Four times a day (QID) | RECTAL | Status: DC | PRN

## 2022-03-28 MED ORDER — AMLODIPINE BESYLATE 5 MG PO TABS: 7.5000 mg | ORAL_TABLET | Freq: Every day | ORAL | Status: DC

## 2022-03-28 MED ORDER — IPRATROPIUM-ALBUTEROL 0.5-2.5 (3) MG/3ML IN SOLN
3.0000 mL | Freq: Once | RESPIRATORY_TRACT | Status: AC
  Administered 2022-03-28: 3 mL via RESPIRATORY_TRACT
  Filled 2022-03-28: qty 3

## 2022-03-28 MED ORDER — LINAGLIPTIN 5 MG PO TABS
5.0000 mg | ORAL_TABLET | Freq: Every day | ORAL | Status: DC
  Administered 2022-03-28 – 2022-03-29 (×2): 5 mg via ORAL
  Filled 2022-03-28 (×2): qty 1

## 2022-03-28 MED ORDER — METHYLPREDNISOLONE SODIUM SUCC 125 MG IJ SOLR
125.0000 mg | Freq: Once | INTRAMUSCULAR | Status: AC
  Administered 2022-03-28: 125 mg via INTRAVENOUS
  Filled 2022-03-28: qty 2

## 2022-03-28 MED ORDER — HYDROCHLOROTHIAZIDE 25 MG PO TABS: 25.0000 mg | ORAL_TABLET | Freq: Every day | ORAL | Status: DC

## 2022-03-28 MED ORDER — APIXABAN 5 MG PO TABS
5.0000 mg | ORAL_TABLET | Freq: Two times a day (BID) | ORAL | Status: DC
  Administered 2022-03-28 – 2022-04-03 (×13): 5 mg via ORAL
  Filled 2022-03-28 (×13): qty 1

## 2022-03-28 MED ORDER — ACETAMINOPHEN 325 MG PO TABS
650.0000 mg | ORAL_TABLET | Freq: Four times a day (QID) | ORAL | Status: DC | PRN
  Administered 2022-03-29 – 2022-03-30 (×3): 650 mg via ORAL
  Filled 2022-03-28 (×3): qty 2

## 2022-03-28 MED ORDER — FUROSEMIDE 10 MG/ML IJ SOLN
40.0000 mg | Freq: Once | INTRAMUSCULAR | Status: AC
  Administered 2022-03-28: 40 mg via INTRAVENOUS
  Filled 2022-03-28: qty 4

## 2022-03-28 MED ORDER — PREDNISONE 20 MG PO TABS
40.0000 mg | ORAL_TABLET | Freq: Every day | ORAL | Status: DC
  Administered 2022-03-29 – 2022-04-03 (×6): 40 mg via ORAL
  Filled 2022-03-28 (×6): qty 2

## 2022-03-28 MED ORDER — MOMETASONE FURO-FORMOTEROL FUM 200-5 MCG/ACT IN AERO
2.0000 | INHALATION_SPRAY | Freq: Two times a day (BID) | RESPIRATORY_TRACT | Status: DC
  Administered 2022-03-29 – 2022-04-03 (×11): 2 via RESPIRATORY_TRACT
  Filled 2022-03-28 (×2): qty 8.8

## 2022-03-28 MED ORDER — LEVOTHYROXINE SODIUM 75 MCG PO TABS
175.0000 ug | ORAL_TABLET | Freq: Every day | ORAL | Status: DC
  Administered 2022-03-29 – 2022-04-03 (×6): 175 ug via ORAL
  Filled 2022-03-28 (×6): qty 1

## 2022-03-28 MED ORDER — IPRATROPIUM-ALBUTEROL 0.5-2.5 (3) MG/3ML IN SOLN
3.0000 mL | RESPIRATORY_TRACT | Status: DC
  Administered 2022-03-28 – 2022-03-29 (×5): 3 mL via RESPIRATORY_TRACT
  Filled 2022-03-28 (×5): qty 3

## 2022-03-28 MED ORDER — SIMVASTATIN 20 MG PO TABS
40.0000 mg | ORAL_TABLET | Freq: Every day | ORAL | Status: DC
  Administered 2022-03-28 – 2022-04-02 (×6): 40 mg via ORAL
  Filled 2022-03-28 (×6): qty 2

## 2022-03-28 MED ORDER — INSULIN ASPART 100 UNIT/ML IJ SOLN
0.0000 [IU] | Freq: Three times a day (TID) | INTRAMUSCULAR | Status: DC
  Administered 2022-03-29 (×2): 2 [IU] via SUBCUTANEOUS
  Administered 2022-03-29: 3 [IU] via SUBCUTANEOUS
  Administered 2022-03-30: 2 [IU] via SUBCUTANEOUS
  Administered 2022-03-30: 3 [IU] via SUBCUTANEOUS
  Administered 2022-03-31: 5 [IU] via SUBCUTANEOUS
  Administered 2022-04-01: 3 [IU] via SUBCUTANEOUS
  Administered 2022-04-01: 1 [IU] via SUBCUTANEOUS
  Administered 2022-04-02: 5 [IU] via SUBCUTANEOUS
  Administered 2022-04-02 – 2022-04-03 (×2): 1 [IU] via SUBCUTANEOUS

## 2022-03-28 MED ORDER — LINAGLIPTIN 5 MG PO TABS
5.0000 mg | ORAL_TABLET | Freq: Every day | ORAL | Status: DC
  Filled 2022-03-28: qty 1

## 2022-03-28 MED ORDER — NICOTINE 21 MG/24HR TD PT24
21.0000 mg | MEDICATED_PATCH | Freq: Every day | TRANSDERMAL | Status: DC
  Administered 2022-03-28 – 2022-04-03 (×7): 21 mg via TRANSDERMAL
  Filled 2022-03-28 (×7): qty 1

## 2022-03-28 NOTE — ED Triage Notes (Addendum)
Patient arrived with EMS from [place] home reports worsening SOB with productive cough , chest congestion and weight gain for 2 weeks , SOB worse when lying flat on bed.

## 2022-03-28 NOTE — Progress Notes (Signed)
Lower extremity venous duplex has been completed.   Preliminary results in CV Proc.   Glenda Boone 03/28/2022 10:24 AM

## 2022-03-28 NOTE — ED Provider Notes (Signed)
[facility] EMERGENCY DEPARTMENT Provider Note   CSN: [id number] Arrival date & time: 03/28/22  0118     History  Chief Complaint  Patient presents with   Shortness of Breath    Glenda Boone is a 71 y.o. female.  71 year old female with prior medical history as detailed below presents for evaluation from [place] home.  Patient reports increasing shortness of breath with mildly productive cough.  This is been ongoing for the last 1 to 2 weeks.  Patient reports that she had a flu shot 2 weeks ago and she feels that this could be the cause of her symptoms.  Patient reports increased shortness of breath with lying flat.  She reports approximately 30 pounds of weight gain.  She reports significant edema in both lower extremities.  She reports that over the last several days she has started using 2 L nasal cannula supplemental O2 with improvement in her dyspnea.  She denies fever.  She denies significant productive sputum.  She denies chest pain.  She reports full compliance with medications including Eliquis.  With supplemental O2 at 2 L nasal cannula she feels improved.  The history is provided by the patient and medical records.       Home Medications Prior to Admission medications   Medication Sig Start Date End Date Taking? Authorizing Provider  albuterol (VENTOLIN HFA) 108 (90 Base) MCG/ACT inhaler Inhale 2 puffs into the lungs every 6 (six) hours as needed for wheezing or shortness of breath. Patient not taking: Reported on 01/31/2020 01/01/20   Mercy Riding, MD  amiodarone (PACERONE) 200 MG tablet Take 1 tablet (200 mg total) by mouth 2 (two) times daily. 02/16/20   Enzo Bi, MD  apixaban (ELIQUIS) 5 MG TABS tablet Take 1 tablet (5 mg total) by mouth 2 (two) times daily. 02/16/20   Enzo Bi, MD  cyclobenzaprine (FLEXERIL) 10 MG tablet Take 1 tablet (10 mg total) by mouth 3 (three) times daily as needed for muscle spasms. 01/28/21   Montine Circle,  PA-C  hydrochlorothiazide (HYDRODIURIL) 25 MG tablet Held until outpatient followup due to intermittent low blood pressure. 02/16/20   Enzo Bi, MD  levothyroxine (SYNTHROID, LEVOTHROID) 200 MCG tablet Take 200 mcg by mouth daily before breakfast. (take with 69mg tablet to equal total of 2738m daily)    [provider]  losartan (COZAAR) 100 MG tablet Held until outpatient followup due to intermittent low blood pressure. 02/16/20   LaEnzo BiMD  metFORMIN (GLUCOPHAGE-XR) 500 MG 24 hr tablet TAKE 2 TABLETS BY MOUTH TWICE DAILY FOR DIABETES    [provider]  mometasone-formoterol (DULERA) 200-5 MCG/ACT AERO Inhale 2 puffs into the lungs 2 (two) times daily. 01/01/20   GoMercy RidingMD  nicotine (NICODERM CQ - DOSED IN MG/24 HOURS) 14 mg/24hr patch Place 1 patch (14 mg total) onto the skin daily. 02/17/20   LaEnzo BiMD  PARoxetine (PAXIL) 20 MG tablet Take 3 tablets (60 mg total) by mouth daily. 04/25/19   [place] CraneMD  simvastatin (ZOCOR) 40 MG tablet Take 40 mg by mouth daily.    [provider]      Allergies    Codeine, Shellfish allergy, Demerol [meperidine hcl], and Gabapentin    Review of Systems   Review of Systems  Respiratory:  Positive for shortness of breath.   All other systems reviewed and are negative.   Physical Exam Updated Vital Signs BP (!) 181/89   Pulse 66  Temp (!) 97.5 F (36.4 C)   Resp 18   SpO2 98%  Physical Exam Vitals and nursing note reviewed.  Constitutional:      General: She is not in acute distress.    Appearance: Normal appearance. She is well-developed.  HENT:     Head: Normocephalic and atraumatic.  Eyes:     Conjunctiva/sclera: Conjunctivae normal.     Pupils: Pupils are equal, round, and reactive to light.  Cardiovascular:     Rate and Rhythm: Normal rate and regular rhythm.     Heart sounds: Normal heart sounds.  Pulmonary:     Effort: Pulmonary effort is normal. No respiratory distress.     Comments:  Mild diffuse expiratory wheezes in all lung fields Abdominal:     General: There is no distension.     Palpations: Abdomen is soft.     Tenderness: There is no abdominal tenderness.  Musculoskeletal:        General: No deformity. Normal range of motion.     Cervical back: Normal range of motion and neck supple.     Right lower leg: Edema present.     Left lower leg: Edema present.  Skin:    General: Skin is warm and dry.  Neurological:     General: No focal deficit present.     Mental Status: She is alert and oriented to person, place, and time.     ED Results / Procedures / Treatments   Labs (all labs ordered are listed, but only abnormal results are displayed) Labs Reviewed  BASIC METABOLIC PANEL - Abnormal; Notable for the following components:      Result Value   Glucose, Bld 155 (*)    BUN 36 (*)    Creatinine, Ser 1.84 (*)    Calcium 8.5 (*)    GFR, Estimated 29 (*)    All other components within normal limits  CBC - Abnormal; Notable for the following components:   RBC 3.35 (*)    Hemoglobin 10.2 (*)    HCT 34.1 (*)    MCV 101.8 (*)    MCHC 29.9 (*)    RDW 15.8 (*)    All other components within normal limits  RESP PANEL BY RT-PCR (FLU A&B, COVID) ARPGX2  BRAIN NATRIURETIC PEPTIDE  TROPONIN I (HIGH SENSITIVITY)  TROPONIN I (HIGH SENSITIVITY)    EKG EKG Interpretation  Date/Time:  Friday March 28 2022 01:31:31 EDT Ventricular Rate:  67 PR Interval:  166 QRS Duration: 84 QT Interval:  438 QTC Calculation: 462 R Axis:   31 Text Interpretation: Normal sinus rhythm Low voltage QRS Nonspecific ST and T wave abnormality Abnormal ECG When compared with ECG of 28-Jan-2021 09:51, PREVIOUS ECG IS PRESENT Confirmed by Dene Gentry [phone]) on 03/28/2022 8:01:31 AM  Radiology DG Chest 2 View  Result Date: 03/28/2022 CLINICAL DATA:  Shortness of breath EXAM: CHEST - 2 VIEW COMPARISON:  01/28/2021 FINDINGS: Mild elevation of the right hemidiaphragm. Heart is  borderline in size. No confluent airspace opacities, effusions or edema. No acute bony abnormality. Aortic atherosclerosis. IMPRESSION: Borderline heart size. Stable chronic elevation of the right hemidiaphragm. No active disease. Electronically Signed   By: Rolm Baptise M.D.   On: 03/28/2022 02:33    Procedures Procedures    Medications Ordered in ED Medications  methylPREDNISolone sodium succinate (SOLU-MEDROL) 125 mg/2 mL injection 125 mg (125 mg Intravenous Given 03/28/22 0908)  ipratropium-albuterol (DUONEB) 0.5-2.5 (3) MG/3ML nebulizer solution 3 mL (3 mLs Nebulization Given 03/28/22 0909)  ED Course/ Medical Decision Making/ A&P                           Medical Decision Making Risk Prescription drug management. Decision regarding hospitalization.    Medical Screen Complete  This patient presented to the ED with complaint of shortness of breath.  This complaint involves an extensive number of treatment options. The initial differential diagnosis includes, but is not limited to, COPD exacerbation, CHF exacerbation, pneumonia, viral process, metabolic abnormality, etc.  This presentation is: Acute, Chronic, Self-Limited, Previously Undiagnosed, Uncertain Prognosis, Complicated, Systemic Symptoms, and Threat to Life/Bodily Function  Patient with multiple comorbidities including COPD, history of A-fib on Eliquis, morbid obesity presents with worsening shortness of breath.  Patient is reporting gradual worsening of symptoms over the last 1-2 weeks.  She is reporting significant weight gain-approximately 30 pounds.  Patient appears to have mild O2 requirement with room air sats in the upper 80s.  This appears to be new per her report.  Patient's presentation is perhaps most consistent with likely COPD exacerbation.  Patient given steroids and breathing treatment.  Patient without clear indication of acute bacterial infection on exam.  Patient would benefit from admission and  further work-up by the inpatient medical team.  Medical service aware of case and will evaluate for admission.   Additional history obtained:  External records from outside sources obtained and reviewed including prior ED visits and prior Inpatient records.    Lab Tests:  I ordered and personally interpreted labs.  The pertinent results include: CBC, BMP, troponin, COVID, flu, BNP   Imaging Studies ordered:  I ordered imaging studies including CXR  I independently visualized and interpreted obtained imaging which showed NAD I agree with the radiologist interpretation.   Cardiac Monitoring:  The patient was maintained on a cardiac monitor.  I personally viewed and interpreted the cardiac monitor which showed an underlying rhythm of: NSR   Medicines ordered:  I ordered medication including solumedrol/duoneb  for suspected COPD exacerbation  Reevaluation of the patient after these medicines showed that the patient: stayed the same  Problem List / ED Course:  Dyspnea   Reevaluation:  After the interventions noted above, I reevaluated the patient and found that they have: improved   Disposition:  After consideration of the diagnostic results and the patients response to treatment, I feel that the patent would benefit from admission.          Final Clinical Impression(s) / ED Diagnoses Final diagnoses:  Dyspnea, unspecified type    Rx / DC Orders ED Discharge Orders     None         Valarie Merino, MD 03/28/22 [phone]

## 2022-03-28 NOTE — H&P (Signed)
Date: 03/28/2022               Patient Name:  Glenda Boone MRN: [id number]  DOB: February 26, 1951 Age / Sex: 71 y.o., female   PCP: [facility] [place] Service: Internal Medicine Teaching Service         Attending Physician: Dr. Evette Doffing, Mallie Mussel, *    First Contact: Dr. Linward Natal Pager: [phone]  Second Contact: Dr. Farrel Gordon Pager: [phone]       After Hours (After 5p/  First Contact Pager: [phone]  weekends / holidays): Second Contact Pager: [phone]   Chief Complaint: Dyspnea   History of Present Illness:  Glenda Boone is a 71 year old person living with a history of atrial fibrillation on eliquis, COPD, diastolic heart failure, CKD III, T2DM, HTN, HLD, tobacco use, class 3 obesity, depression and anxiety and remote history of ovarian cancer s/p chemo and TAH in 2005 who presents today for 2 week of dyspnea. Reports she had a influenza vaccine and subsequently started to feel unwell. Endorses orthopnea, LE swelling and abdominal bloating. Also notes productive cough and fatigue. Had similar symptoms after influenza vaccine in 2011. She is very sleepy and reports she has been unable to sleep well since symptoms started due to her orthopnea and general discomfort. She was placed on nighttime 2L [place] at her facility since symptoms started with some improvement. Also been using albuterol daily. She denies hospitalizations for COPD exacerbation or recent need for oral steroids recently.  She denies fever, chills, chest pain, palpitation, abdominal pain, nausea, vomiting, diarrhea, dizziness, or syncope.    Meds:  Albuterol ever 6 hours as needed  Amlodipine 7.5 mg daily Buspirone 10 mg twice daily Eliquis 5 mg twice daily Gemtesa 75 mg daily Hydrochlorothiazide 25 mg daily Hydroxyzine 10 mg twice daily as needed for anxiety Ibuprofen 600 mg as needed for pain Synthroid 175 mcg daily Tizanidine 1 mg as needed for pain Topiramate 25 mg 2  tablets twice daily for depression and weight loss Tradjenta 5 mg daily Trelegy 100-60 2.5-25 mcg 1 inhalation daily Simvastatin 40 mg daily  Allergies: Allergies as of 03/28/2022 - Review Complete 03/28/2022  Allergen Reaction Noted   Codeine  12/19/2014   Shellfish allergy Hives 01/30/2020   Demerol [meperidine hcl] Anxiety 07/23/2017   Gabapentin Other (See Comments) 12/31/2019   Past Medical History:  Diagnosis Date   Anxiety    Bronchitis    COPD (chronic obstructive pulmonary disease) ([place])    Depression    Diabetes mellitus, type II (HCC)    Dyspnea    GERD (gastroesophageal reflux disease)    Hyperlipidemia    Hypertension    Hypothyroidism    Neuropathy    Ovarian cancer ([place])    Shingles    Thyroid disease     Family History: Heart disease(mother), Colon cancer(father, maternal and paternal aunts)   Social History: Has been living at [facility] and rehab for the last 3 years. Reports daughter took her money and sent to to the nursing home. Has a sister who she is close to who lives in [place], [place], Also has 4 grandchildren in [place]. 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 Normally ambulates with cane but uses a walker for anything further suspect due to deconditioning.  Currently smoking 1/2 ppd. Started smoking at age 68 around 2 packs per day. Denies alcohol or other drug use.  Review of Systems: A complete ROS was negative except as per HPI.   Physical Exam: Blood pressure (!) 160/86, pulse (!) 59, temperature (!) 97.5 F (36.4 C), resp. rate 17, SpO2 98 %. Constitutional:  HENT: Normocephalic and atraumatic, EOMI, conjunctiva normal, moist mucous membranes Cardiovascular: Normal rate and regular rhythm.  No murmurs.  No JVD.  Significant bilateral lower extremity edema and abdominal wall edema Respiratory: Bilateral inspiratory and expiratory wheezes.  Difficult to appreciate crackles.  Increased work of breathing, speaking and mumbling and  quick sentences GI: Distended and obese, soft, nontender to palpation, normal active bowel sounds Musculoskeletal: Morbidly obese Neurological: Is alert and oriented x4, very somnolent falling asleep between questions Skin: Chronic venous stasis changes with erythema of the bilateral lower extremities, no drainage or open wounds Psychiatric: Normal mood and affec  EKG: personally reviewed my interpretation is  NSR HR 67, normal axis, low voltage, no acute ischemic changes  CXR: personally reviewed my interpretation is no focal opacities, elevated right hemidiaphragm, no pulmonary congestion  Assessment & Plan by Problem: Principal Problem:   COPD with acute exacerbation (HCC)  COPD exacerbation [place]. Increased cough with sputum, wheezing and dyspnea.  On Trelegy and albuterol.  Did not see prior admissions for COPD exacerbations in the last year.  Do not see documented PFTs. Requiring 3L [place] normally on RA, very sleepy, getting VGB. No leukocytosis, fever, or signs of infection hold off on antibiotix. Had influenza vaccine 2 weeks ago. Feeling better with nebulizer and solumedrol in ED. Suspect she may also have OSA/obesity hypoventilation that is contributing to this will trial CPAP at night. -Duonebs every 4 hours, prednisone 40 mg daily -On trelegy, continue LAMA/LAMA/ICS  - check VBG -O2 as needed  Acute on chronic heart failure exacerbation  Last echo 02/10/2020 with EF of 55 to 60%.  Grade 1 diastolic dysfunction.  Mildly dilated left and right atria.  Now presenting with orthopnea, lower extremity swelling, and new oxygen requirement.  No chest pain new murmur.  BMP within normal limits although this may be low in the setting of renal dysfunction and obesity.  EKG in normal sinus rhythm and troponins are negative x2.  May be caused by COPD exacerbation.  Does not appear she is on GDMT at at baseline. -IV furosemide 40 mg -Echocardiogram ordered -Hold HCTZ in setting of AKI, hold  amlodipine due to lower extremity swelling -Monitor kidney function, electrolytes and replete as needed -Strict I's and O's, daily weights -GDMT when dyspnea and renal function improve  AKI on CKD 3 Prior labs from 1 year ago appear consistent with CKD 3a.  BUN 36 and creatinine of 1.84 on admission today.  Likely in setting vascular congestion from heart failure exacerbation -Diuresing as above -Monitor I/Os and BMP  Atrial fibrillation CHA2DS2VASc-5 for age, CHF, HTN, sex, and diabetes. In NSR with rate in 60-70s - Continue eliquis 5 mg twice daily  Macrocytic anemia Hemoglobin of 10.2.Marland Kitchen  Previously 11.9 1-year ago.  MCV elevated at 101.8.  Is on chronic anticoagulation.  No obvious bleeding. -Monitor hemoglobin -Ferritin, iron studies   OSA Denies history of this. Suspect OSA given neck circumference, daytime somnolence, HTN, age, and weight -CPAP at night  Hypertension BP elevated to 160/86.  Home medications amlodipine 7.5 mg daily and hydrochlorothiazide 25 mg daily.  Holding antihypertensives in setting of leg swelling and AKI.  May benefit from switching to ACE/ARB once renal function improves.  T2DM On linaglitin 5 mg daily -A1c -CBG monitoring -  Continue linagliptin  Hypothyroidism -Continue home levothyroxine 175 mcg daily  Hyperlipidemia Continue simvastatin  Code: DNR Fluids: None DVT ppx: on Eliqus for Afib  Dispo: Admit patient to Inpatient with expected length of stay greater than 2 midnights.  Signed: Iona Beard, MD 03/28/2022, 10:45 AM  Pager: [phone] After 5pm on weekdays and 1pm on weekends: On Call pager: [phone]

## 2022-03-28 NOTE — ED Provider Triage Note (Signed)
Emergency Medicine Provider Triage Evaluation Note  [name] , a 71 y.o. female  was evaluated in triage.  Pt complains of SOB with 30 pound weight gain in the past week. She reports that this started since her flu shot. EMS reports that she was at 90 on RA and put her on 4L at 94% reports that she is 86%when laying flat on RA. Denies any chest pain. She reports that she has had fluid on her legs that is new. Patient compliant with all meds.   Review of Systems  Positive:  Negative:   Physical Exam  There were no vitals taken for this visit. Gen:   Awake, no distress   Resp:  Normal effort, CTAB MSK:   Moves extremities without difficulty  Other:  Large edematous legs bilaterally  Medical Decision Making  Medically screening exam initiated at 1:40 AM.  Appropriate orders placed.  Lorretta Kerce Britten was informed that the remainder of the evaluation will be completed by another provider, this initial triage assessment does not replace that evaluation, and the importance of remaining in the ED until their evaluation is complete.  Labs and imaging ordered.    Sherrell Puller, [place] 03/28/22 [phone]

## 2022-03-28 NOTE — Plan of Care (Signed)

## 2022-03-28 NOTE — Progress Notes (Signed)
Patient refused use of CPAP for the evening 

## 2022-03-28 NOTE — Progress Notes (Signed)
CBG is 299, RN messaged MD on call to see if they want to give anything. Awaiting response.   Glenda Boone

## 2022-03-28 NOTE — Progress Notes (Signed)
  Echocardiogram 2D Echocardiogram has been performed.  Glenda Boone 03/28/2022, 3:22 PM

## 2022-03-28 NOTE — ED Notes (Signed)
Help get patient pulled up in the bed patient is resting with call bell in each

## 2022-03-29 LAB — COMPREHENSIVE METABOLIC PANEL
ALT: 23 U/L (ref 0–44)
AST: 24 U/L (ref 15–41)
Albumin: 3 g/dL — ABNORMAL LOW (ref 3.5–5.0)
Alkaline Phosphatase: 45 U/L (ref 38–126)
Anion gap: 11 (ref 5–15)
BUN: 37 mg/dL — ABNORMAL HIGH (ref 8–23)
CO2: 23 mmol/L (ref 22–32)
Calcium: 8.5 mg/dL — ABNORMAL LOW (ref 8.9–10.3)
Chloride: 105 mmol/L (ref 98–111)
Creatinine, Ser: 1.67 mg/dL — ABNORMAL HIGH (ref 0.44–1.00)
GFR, Estimated: 32 mL/min — ABNORMAL LOW (ref >60)
Glucose, Bld: 227 mg/dL — ABNORMAL HIGH (ref 70–99)
Potassium: 4 mmol/L (ref 3.5–5.1)
Sodium: 139 mmol/L (ref 135–145)
Total Bilirubin: 0.3 mg/dL (ref 0.3–1.2)
Total Protein: 6 g/dL — ABNORMAL LOW (ref 6.5–8.1)

## 2022-03-29 LAB — URINALYSIS, ROUTINE W REFLEX MICROSCOPIC
Bilirubin Urine: NEGATIVE
Glucose, UA: NEGATIVE mg/dL
Hgb urine dipstick: NEGATIVE
Ketones, ur: NEGATIVE mg/dL
Nitrite: POSITIVE — AB
Protein, ur: NEGATIVE mg/dL
Specific Gravity, Urine: 1.017 (ref 1.005–1.030)
pH: 5 (ref 5.0–8.0)

## 2022-03-29 LAB — CBC
HCT: 29.8 % — ABNORMAL LOW (ref 36.0–46.0)
Hemoglobin: 9.3 g/dL — ABNORMAL LOW (ref 12.0–15.0)
MCH: 31 pg (ref 26.0–34.0)
MCHC: 31.2 g/dL (ref 30.0–36.0)
MCV: 99.3 fL (ref 80.0–100.0)
Platelets: 253 10*3/uL (ref 150–400)
RBC: 3 MIL/uL — ABNORMAL LOW (ref 3.87–5.11)
RDW: 15.7 % — ABNORMAL HIGH (ref 11.5–15.5)
WBC: 9.6 10*3/uL (ref 4.0–10.5)
nRBC: 0 % (ref 0.0–0.2)

## 2022-03-29 LAB — GLUCOSE, CAPILLARY
Glucose-Capillary: 151 mg/dL — ABNORMAL HIGH (ref 70–99)
Glucose-Capillary: 187 mg/dL — ABNORMAL HIGH (ref 70–99)
Glucose-Capillary: 233 mg/dL — ABNORMAL HIGH (ref 70–99)
Glucose-Capillary: 192 mg/dL — ABNORMAL HIGH (ref 70–99)

## 2022-03-29 MED ORDER — ALBUTEROL SULFATE (2.5 MG/3ML) 0.083% IN NEBU: 2.5000 mg | INHALATION_SOLUTION | RESPIRATORY_TRACT | Status: DC | PRN

## 2022-03-29 MED ORDER — IPRATROPIUM-ALBUTEROL 0.5-2.5 (3) MG/3ML IN SOLN
3.0000 mL | Freq: Two times a day (BID) | RESPIRATORY_TRACT | Status: DC
  Administered 2022-03-29 – 2022-03-31 (×4): 3 mL via RESPIRATORY_TRACT
  Filled 2022-03-29 (×3): qty 3

## 2022-03-29 MED ORDER — SULFAMETHOXAZOLE-TRIMETHOPRIM 800-160 MG PO TABS
1.0000 | ORAL_TABLET | Freq: Two times a day (BID) | ORAL | Status: AC
  Administered 2022-03-29 – 2022-03-31 (×6): 1 via ORAL
  Filled 2022-03-29 (×6): qty 1

## 2022-03-29 MED ORDER — FUROSEMIDE 10 MG/ML IJ SOLN
40.0000 mg | Freq: Once | INTRAMUSCULAR | Status: AC
  Administered 2022-03-29: 40 mg via INTRAVENOUS
  Filled 2022-03-29: qty 4

## 2022-03-29 NOTE — Plan of Care (Signed)

## 2022-03-29 NOTE — Hospital Course (Signed)
Glenda Boone is a 71 year old female with a history of COPD who presented with increasing dyspnea, orthopnea, and lower extremity edema that had worsened over the past 2 weeks. On arrival she required 4-5L of supplemental oxygen which was elevated as she is typically not on oxygen at baseline.   COPD with Acute Exacerbation CXR demonstrated no active disease without effusions or edema. Bilateral lower extremity ultrasound was also obtained and demonstrated no evidence of DVT. Patient was placed on supplemental oxygen and a 5 day course of oral prednisone '40mg'$  QD along with Duoneb (q4H), Dulera (2 puffs BID), and Incruse Ellipta (1 puff daily). Oxygen supplementation was slowly tapered as tolerated to a baseline of 1.5 - 2L with an oxygenation goal of 88%-92%. Patient experienced mild desaturation to 88% with activity as noted per nursing staff, but quickly came back to 93% with rest.    Acute on Chronic Heart Failure with Preserved Ejection Fraction Echocardiogram demonstrated an EF >75% with grade 1 diastolic dysfunction. Patient was diuresed initially with IV furosemide '40mg'$  daily then switched to oral lasix. Throughout the course of her stay, she had a net loss of 13kg and 12.4L. Lasix was discontinued as lower extremity edema, dyspnea, and orthopnea resolved as patient no longer appeared to be fluid overloaded.    Obstructive Sleep Apnea Patient was tolerating 1.5-2L [place] well while awake, but desaturated to 75% while asleep according to nursing staff. She required 3L while asleep to maintain adequate oxygen saturation. This inability to maintain oxygenation may contribute to patient's nighttime anxiety. She stated she is unable to use a CPAP at her facility, but it does not appear necessary at this time as she was saturating well with [place]. She expressed understanding of the importance to increase her oxygen use while she was resting.  Acute Uncomplicated Urinary Tract Infection Patient complained  of dysuria during stay. Urinalysis demonstrated UTI. Patient was provided with TMP/SMX 1 tab BID for 3 days with resolution of symptoms.   Chronic Kidney Disease 3b On arrival, patient's creatinine was elevated. Patient was provided with lasix as described above resulting in a decrease in her creatinine level. During the course of her stay, her creatinine became elevated once again when she was started on dapagliflozin, but the medication was continued due to the long-term benefits of this medication for both her diabetes, heart failure, and chronic kidney disease.   Atrial Fibrillation Patient experienced atrial fibrillation intermittently throughout her stay. Metoprolol  succinate was provided, but had to be discontinued due to bradycardia. Rate remained controlled without medication throughout the remainder of her stay.   Essential Hypertension Patient experienced mild hypertension throughout stay with a high of 152/69.   Type 2 Diabetes Mellitus HgbA1c of 7.3% on 11/3. Patient was provided with sliding scale insulin and regular glucose monitoring. She was also started on dapagliflozin during her stay as stated above.   Anemia Hemoglobin and hematocrit remained stable throughout duration of stay. Patient experienced no overt signs or symptoms of anemia or bleeding. MCV 101.8 on admission, but <100 throughout remainder of stay. This was thought to be due to CKD.   Hypothyroidism Pharmacy could not reconcile medication list with facility and patient records. Performing TSH and free T4 today and will report results to nursing facility to determine if patient needs to continue levothyroxine 194mg QD.   Hyperlipidemia Pharmacy could not reconcile medication list with facility and patient records. Performing lipid panel today and will report results to nursing facility to determine if patient  needs to continue simvastatin '40mg'$  QD.   Depression Anxiety Patient continued to express some feelings  of anxiety that tended to worsen in the evening. These feelings were often associated with shortness of breath and resolved with medication and supplemental oxygen.

## 2022-03-29 NOTE — Evaluation (Signed)
Physical Therapy Evaluation Patient Details Name: Glenda Boone MRN: [id number] DOB: 1950/08/29 Today's Date: 03/29/2022  History of Present Illness  71 year old person admitted with COPD exacerbation. She reports several upper respiratory viral infections over the last 2 weeks, says she had a diagnosis of acute bronchitis. Patient lives in an assisted living facility for last 3 years.living with severe obesity, atrial fibrillation, CKD stage IIIa  Clinical Impression   Pt admitted with above diagnosis. Has been a [facility] at [facility] for a few years; Prior to admission, pt was able to transfer bed to wheelchair with assist, and propel wheelchair with LEs and UEs around her complex, enjoys going to the patio; Presents to PT with decr functional mobility, DOE, abdominal discomfort limiting activity tolerance; Required min assist and incr time to sit up on EOB, Max assist to lay back down and position in bed;  Recommend PT once back to SNF to help return to baseline functioning; Pt currently with functional limitations due to the deficits listed below (see PT Problem List). Pt will benefit from skilled PT to increase their independence and safety with mobility to allow discharge to the venue listed below.          Recommendations for follow up therapy are one component of a multi-disciplinary discharge planning process, led by the attending physician.  Recommendations may be updated based on patient status, additional functional criteria and insurance authorization.  Follow Up Recommendations Skilled nursing-short term rehab (<3 hours/day) Can patient physically be transported by private vehicle: No    Assistance Recommended at Discharge    Patient can return home with the following       Equipment Recommendations Other (comment) (TBD at SNF)  Recommendations for Other Services       Functional Status Assessment Patient has had a recent decline in their functional status and  demonstrates the ability to make significant improvements in function in a reasonable and predictable amount of time.     Precautions / Restrictions Precautions Precautions: Fall Restrictions Weight Bearing Restrictions: No      Mobility  Bed Mobility Overal bed mobility: Needs Assistance Bed Mobility: Supine to Sit, Sit to Supine     Supine to sit: Min assist Sit to supine: Max assist   General bed mobility comments: HOB elevated as pt moved LEs and turned trunk to sit; Able to push up to sit using bed rails; min assist and use of bed pad to scoot hip s forward to EOB; Max assist to help LEs into bed, bed put in [place] position to lay back dow and scoot up to [facility]    Transfers                        Ambulation/Gait                  Stairs            Wheelchair Mobility    Modified Rankin (Stroke Patients Only)       Balance                                             Pertinent Vitals/Pain Pain Assessment Pain Assessment: Faces Faces Pain Scale: Hurts little more Pain Location: Abdominal discomfort that worsened with sitting up EOB Pain Descriptors / Indicators: Discomfort Pain Intervention(s): Repositioned    Home  Living Family/patient expects to be discharged to:: Skilled nursing facility                   Additional Comments: Resident of [facility] for approx 2 years    Prior Function Prior Level of Function : Needs assist             Mobility Comments: Unclear historian; Reports needs assist for OOB to wheelchair transfers, but that she also sometimes gets up by herself and walks to her bathroom (assumed short distance) by herself; Reports she enjoys going in her wheelchair to the patio; propels wheelchair with UEs and LEs ADLs Comments: Assist from staff     Hand Dominance        Extremity/Trunk Assessment   Upper Extremity Assessment Upper Extremity Assessment: Generalized weakness     Lower Extremity Assessment Lower Extremity Assessment: Generalized weakness;RLE deficits/detail;LLE deficits/detail (Hip ROM grossly limited by body habitus) RLE Deficits / Details: Able to move hip in all planes and extend knee against gravity LLE Deficits / Details: Able to move hip in all planes and extend knee against gravity    Cervical / Trunk Assessment Cervical / Trunk Assessment: Other exceptions Cervical / Trunk Exceptions: Large body habitus  Communication   Communication: Expressive difficulties (At times difficult to understand)  Cognition Arousal/Alertness: Awake/alert Behavior During Therapy: WFL for tasks assessed/performed Overall Cognitive Status: No family/caregiver present to determine baseline cognitive functioning                                 General Comments: At times difficult to asses due to difficulty understanding pt's speech; some inconsistencies in pt's reported PLOF        General Comments General comments (skin integrity, edema, etc.): Session conducted on 4 L supplemental O2 and O2 sats remained at or above 91%    Exercises     Assessment/Plan    PT Assessment Patient needs continued PT services  PT Problem List Decreased strength;Decreased range of motion;Decreased activity tolerance;Decreased balance;Decreased mobility;Decreased coordination;Decreased cognition;Decreased knowledge of use of DME;Decreased safety awareness;Decreased knowledge of precautions;Cardiopulmonary status limiting activity;Obesity       PT Treatment Interventions DME instruction;Gait training;Functional mobility training;Therapeutic activities;Therapeutic exercise;Balance training;Neuromuscular re-education;Cognitive remediation;Patient/family education    PT Goals (Current goals can be found in the Care Plan section)  Acute Rehab PT Goals Patient Stated Goal: Be able to get OOB PT Goal Formulation: With patient Time For Goal Achievement:  04/12/22 Potential to Achieve Goals: Good    Frequency Min 2X/week     Co-evaluation               AM-PAC PT "6 Clicks" Mobility  Outcome Measure Help needed turning from your back to your side while in a flat bed without using bedrails?: A Lot Help needed moving from lying on your back to sitting on the side of a flat bed without using bedrails?: A Lot Help needed moving to and from a bed to a chair (including a wheelchair)?: Total Help needed standing up from a chair using your arms (e.g., wheelchair or bedside chair)?: Total Help needed to walk in hospital room?: Total Help needed climbing 3-5 steps with a railing? : Total 6 Click Score: 8    End of Session Equipment Utilized During Treatment: Oxygen (bed pads) Activity Tolerance: Other (comment) (limited by abdominal discomfort and dyspnea) Patient left: in bed;with call bell/phone within reach Nurse Communication: Mobility status;Other (  comment) (Pt requesting Pure wick adjustment and to be checked for BM) PT Visit Diagnosis: Other abnormalities of gait and mobility (R26.89);Muscle weakness (generalized) (M62.81)    Time: 1287-8676 PT Time Calculation (min) (ACUTE ONLY): 34 min   Charges:   PT Evaluation $PT Eval Moderate Complexity: 1 Mod PT Treatments $Therapeutic Activity: 8-22 mins        Roney Marion, PT  Acute Rehabilitation Services Office [phone]   [name] 03/29/2022, 11:48 AM

## 2022-03-29 NOTE — Progress Notes (Addendum)
Subjective:  Glenda Boone is a 71 year old female with a history of COPD, HTN, T2DM, and Afib who presented to the [facility] ED from [place] home complaining of shortness of breath, orthopnea, and a mildly productive cough. She states these symptoms have been worsening for the past 1-2 weeks since she received her flu shot. She believes she has also gained roughly 40lbs during this time as her legs have been swelling, which she states has never happened before. She denies requiring oxygen at the nursing facility, but is currently requiring 4L via nasal cannula.  Today she was resting comfortably in bed and states that her breathing has improved since yesterday, especially with the use of the nebulizer. She did mention she had difficulty breathing when lying back to sleep last night, but notes show she refused CPAP yesterday evening. She is still experiencing dyspnea and bilateral lower extremity edema at this time. Patient also complains of burning when using her pure-wick this morning.  Objective:  Vital signs in last 24 hours: Vitals:   03/29/22 0516 03/29/22 0523 03/29/22 0914 03/29/22 1011  BP:  126/61  (!) 121/57  Pulse: 76 76  69  Resp:  18  (!) 187  Temp:  98.3 F (36.8 C)  98.1 F (36.7 C)  TempSrc:  Oral  Oral  SpO2: 95% 96% 95% 97%   Weight change:   Intake/Output Summary (Last 24 hours) at 03/29/2022 1120 Last data filed at 03/29/2022 0200 Gross per 24 hour  Intake 1320 ml  Output 1310 ml  Net 10 ml   Physical Exam Constitutional:      General: She is not in acute distress.    Appearance: She is obese.  Cardiovascular:     Rate and Rhythm: Normal rate. Rhythm irregular.     Heart sounds: No murmur heard.    No friction rub. No gallop.  Pulmonary:     Effort: No respiratory distress.     Breath sounds: Examination of the left-upper field reveals wheezing. Examination of the left-lower field reveals rhonchi. Wheezing (expiratory) and rhonchi present.   Abdominal:     Palpations: Abdomen is soft.     Tenderness: There is no abdominal tenderness.  Musculoskeletal:     Right lower leg: Tenderness present. Edema present.     Left lower leg: Tenderness present. Edema (with beginnings of skin breakdown/pustule formation along the distal anterior tibial region) present.  Skin:    General: Skin is dry.  Neurological:     General: No focal deficit present.     Mental Status: She is alert and oriented to person, place, and time.  Psychiatric:        Mood and Affect: Mood normal.      Results for orders placed or performed during the hospital encounter of 03/28/22 (from the past 24 hour(s))  Glucose, capillary     Status: Abnormal   Collection Time: 03/28/22  4:43 PM  Result Value Ref Range   Glucose-Capillary 221 (H) 70 - 99 mg/dL  Blood gas, venous     Status: None   Collection Time: 03/28/22  5:09 PM  Result Value Ref Range   pH, Ven 7.29 7.25 - 7.43   pCO2, Ven 58 44 - 60 mmHg   pO2, Ven 39 32 - 45 mmHg   Bicarbonate 27.9 20.0 - 28.0 mmol/L   Acid-Base Excess 0.1 0.0 - 2.0 mmol/L   O2 Saturation 55.1 %   Patient temperature 37.0   Ferritin  Status: None   Collection Time: 03/28/22  5:09 PM  Result Value Ref Range   Ferritin 52 11 - 307 ng/mL  Iron and TIBC     Status: None   Collection Time: 03/28/22  5:09 PM  Result Value Ref Range   Iron 50 28 - 170 ug/dL   TIBC 316 250 - 450 ug/dL   Saturation Ratios 16 10.4 - 31.8 %   UIBC 266 ug/dL  Hemoglobin A1c     Status: Abnormal   Collection Time: 03/28/22  5:09 PM  Result Value Ref Range   Hgb A1c MFr Bld 7.3 (H) 4.8 - 5.6 %   Mean Plasma Glucose 162.81 mg/dL  Magnesium     Status: None   Collection Time: 03/28/22  5:09 PM  Result Value Ref Range   Magnesium 2.4 1.7 - 2.4 mg/dL  Glucose, capillary     Status: Abnormal   Collection Time: 03/28/22  8:13 PM  Result Value Ref Range   Glucose-Capillary 299 (H) 70 - 99 mg/dL  Comprehensive metabolic panel     Status:  Abnormal   Collection Time: 03/29/22  3:14 AM  Result Value Ref Range   Sodium 139 135 - 145 mmol/L   Potassium 4.0 3.5 - 5.1 mmol/L   Chloride 105 98 - 111 mmol/L   CO2 23 22 - 32 mmol/L   Glucose, Bld 227 (H) 70 - 99 mg/dL   BUN 37 (H) 8 - 23 mg/dL   Creatinine, Ser 1.67 (H) 0.44 - 1.00 mg/dL   Calcium 8.5 (L) 8.9 - 10.3 mg/dL   Total Protein 6.0 (L) 6.5 - 8.1 g/dL   Albumin 3.0 (L) 3.5 - 5.0 g/dL   AST 24 15 - 41 U/L   ALT 23 0 - 44 U/L   Alkaline Phosphatase 45 38 - 126 U/L   Total Bilirubin 0.3 0.3 - 1.2 mg/dL   GFR, Estimated 32 (L) >60 mL/min   Anion gap 11 5 - 15  CBC     Status: Abnormal   Collection Time: 03/29/22  3:14 AM  Result Value Ref Range   WBC 9.6 4.0 - 10.5 K/uL   RBC 3.00 (L) 3.87 - 5.11 MIL/uL   Hemoglobin 9.3 (L) 12.0 - 15.0 g/dL   HCT 29.8 (L) 36.0 - 46.0 %   MCV 99.3 80.0 - 100.0 fL   MCH 31.0 26.0 - 34.0 pg   MCHC 31.2 30.0 - 36.0 g/dL   RDW 15.7 (H) 11.5 - 15.5 %   Platelets 253 150 - 400 K/uL   nRBC 0.0 0.0 - 0.2 %  Glucose, capillary     Status: Abnormal   Collection Time: 03/29/22  7:42 AM  Result Value Ref Range   Glucose-Capillary 151 (H) 70 - 99 mg/dL  Urinalysis, Routine w reflex microscopic Urine, Clean Catch     Status: Abnormal   Collection Time: 03/29/22  8:50 AM  Result Value Ref Range   Color, Urine YELLOW YELLOW   APPearance HAZY (A) CLEAR   Specific Gravity, Urine 1.017 1.005 - 1.030   pH 5.0 5.0 - 8.0   Glucose, UA NEGATIVE NEGATIVE mg/dL   Hgb urine dipstick NEGATIVE NEGATIVE   Bilirubin Urine NEGATIVE NEGATIVE   Ketones, ur NEGATIVE NEGATIVE mg/dL   Protein, ur NEGATIVE NEGATIVE mg/dL   Nitrite POSITIVE (A) NEGATIVE   Leukocytes,Ua SMALL (A) NEGATIVE   RBC / HPF 0-5 0 - 5 RBC/hpf   WBC, UA 0-5 0 - 5 WBC/hpf   Bacteria,  UA RARE (A) NONE SEEN   Squamous Epithelial / LPF 0-5 0 - 5   Mucus PRESENT   Glucose, capillary     Status: Abnormal   Collection Time: 03/29/22 10:55 AM  Result Value Ref Range   Glucose-Capillary  187 (H) 70 - 99 mg/dL    EKG: personally reviewed. Normal sinus rhythm. No axis deviation. Borderline Q-T interval. Global low voltage. Delayed anterior R wave progression. Non-specific T wave changes. No signs of ST-elevation, depression ,or Q waves.   CXR: personally reviewed. No active disease. No effusions or edema.   Echocardiogram: Left ventricular ejection fraction, by estimation, is >75%. The left ventricle has hyperdynamic function. The left ventricle has no regional wall motion abnormalities. There is moderate concentric left ventricular hypertrophy. Left ventricular diastolic parameters are consistent with Grade I diastolic dysfunction (impaired relaxation).  Right ventricular systolic function is normal. The right ventricular size is normal.  The mitral valve is normal in structure. No evidence of mitral valve regurgitation. No evidence of mitral stenosis. The aortic valve has an indeterminant number of cusps. Aortic valve regurgitation is not visualized. No aortic stenosis is present.   Lower Extremity Venous Ultrasound: Right: there is no evidence of deep vein thrombosis in the lower extremity. However, portions of this examination were limited - see technologist comments.  Left: there is no evidence of deep vein thrombosis in the lower extremity. However, portions of this examination were limited - see technologist comments.   Assessment/Plan:  Principal Problem:   COPD with acute exacerbation (HCC) Active Problems:   Essential hypertension   Obesity, Class III, BMI 40-49.9 (morbid obesity) (HCC)   Type 2 diabetes mellitus with hyperlipidemia (HCC)   Atrial fibrillation with rapid ventricular response (HCC)   Acute on chronic heart failure with preserved ejection fraction (HFpEF) (HCC)  COPD with Acute Exacerbation, improving Oxygen requirements have improved from 5L [place] overnight to 4L this morning. Wheezing improved today. She is tolerating inhaler treatments well. VBG  results did not demonstrate an acid-base imbalance. Hypoventilation due to obesity and possible OSA may be contributing as well. Optimize patient's home inhaler use upon discharge. - Continue Duoneb (q4H), Dulera (2 puffs BID), and Incruse Ellipta (1 puff daily) - Continue prednisone '40mg'$  QD - Continue O2 PRN, goal SpO2 88-92%  Acute on Chronic Heart Failure with Preserved Ejection Fraction Patient denies prior heart failure symptoms, but is currently experiencing orthopnea, bilateral lower extremity edema, and is requiring oxygen. Troponin negative x4, BNP 57.9. Echo performed yesterday demonstrated EF >75% with moderate concentric left ventricular hypertrophy consistent with Grade I diastolic dysfunction without evidence of valvular pathology. Plan to assess patient's ability to tolerate GDMT prior to discharge due to current renal dysfunction. Is not on GDMT at baseline. Plan to start dapagliflozin tomorrow. - lasix 40 mg IV again today. K+ 4 on CMP today.   Acute Uncomplicated Urinary Tract Infection Patient complained of burning with urination. Urinalysis positive for leukocyte esterase and nitrites, rare bacteria. 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 GFR 32, creatinine improved from 1.84 to 1.67 since yesterday. - Diuresing as above - Continue to monitor renal function  Atrial Fibrillation CHA2DS2VASc score 5. Rate controlled. - Continue Eliquis   Obstructive Sleep Apnea Refused CPAP last night but complained of continued dyspnea overnight. Required 5L [place] overnight. - Continue to recommend CPAP use at night  Essential Hypertension Normotensive today. May benefit from [place] on  discharge given heart failure. - Holding home amlodipine due to lower extremity edema  Type 2 Diabetes Mellitus A1c 7.3% on 11/3. Fasting 151 today. Will consider addition of SGLT2i tomorrow.  - SSI, may require addition of WIC  or basal dosing given prednisone. CTM. - Discontinue linagliptin '5mg'$  QD  Macrocytic Anemia Possibly with anemia of chronic disease as well. Hemoglobin of 9.3 today (down from 10.2 yesterday). No signs of overt bleeding. Asymptomatic. HDS. MCV<100 this admission. She is not on folate or B12 supplement at home. - continue to monitor  Hypothyroidism - Continue levothyroxine 175 mcg QD  Hyperlipidemia - Continue simvastatin '40mg'$  QD   Best Practices: Diet: Carb Modified Fluids: None VTE Prophylaxis: Eliquis for Afib Antibiotics: None Code Status: DNR Attestation for Student Documentation:  I personally was present and performed or re-performed the history, physical exam and medical decision-making activities of this service and have verified that the service and findings are accurately documented in the student's note.  Linward Natal, MD 03/29/2022, 11:20 AM    LOS: 1 day   Linward Natal, MD 03/29/2022, 11:20 AM

## 2022-03-29 NOTE — Progress Notes (Signed)
O2 sat 95% on 4 L N/C, O2 decreased to 2 L via N/C, will cont. To monitor  Glenda Boone

## 2022-03-29 NOTE — Evaluation (Signed)
Occupational Therapy Evaluation Patient Details Name: Glenda Boone MRN: [id number] DOB: 02/10/1951 Today's Date: 03/29/2022   History of Present Illness 71 year old person admitted with COPD exacerbation. She reports several upper respiratory viral infections over the last 2 weeks, says she had a diagnosis of acute bronchitis. Patient lives in an assisted living facility for last 3 years.living with severe obesity, atrial fibrillation, CKD stage IIIa   Clinical Impression   Pt presents with decline in function and safety with ADLs/selfcare and ADL mobility with impaired strength, balance and endurance. PTA, pt lived at a facility and reports that she was Ind with ADLs/selfcare and that she was able to transfer bed to wheelchair with assist, and propel wheelchair with LEs and UEs around her complex, enjoys going to the patio; Pt vurrently requires extensive assist with be dmobility using rails to ling sit with DOE, abdominal discomfort limiting activity tolerance; mod A - total A with ADLs at bed level. Pt would benefit from acute OT services to address impairments to maximize level of function and safety. Required min assist and incr time to sit up on EOB, Recommend continued skilled OT services once back to SNF to help return to baseline functioning    Recommendations for follow up therapy are one component of a multi-disciplinary discharge planning process, led by the attending physician.  Recommendations may be updated based on patient status, additional functional criteria and insurance authorization.   Follow Up Recommendations  Skilled nursing-short term rehab (<3 hours/day)    Assistance Recommended at Discharge    Patient can return home with the following Two people to help with bathing/dressing/bathroom;A lot of help with bathing/dressing/bathroom    Functional Status Assessment  Patient has had a recent decline in their functional status and demonstrates the ability to make  significant improvements in function in a reasonable and predictable amount of time.  Equipment Recommendations  None recommended by OT    Recommendations for Other Services       Precautions / Restrictions Precautions Precautions: Fall Restrictions Weight Bearing Restrictions: No      Mobility Bed Mobility Overal bed mobility: Needs Assistance       Supine to sit: Mod assist     General bed mobility comments: mod A with trunk elevation and using B rails for ling sitting in bed    Transfers                          Balance                                           ADL either performed or assessed with clinical judgement   ADL Overall ADL's : Needs assistance/impaired Eating/Feeding: Independent;Sitting;Bed level   Grooming: Wash/dry hands;Wash/dry face;Set up;Supervision/safety;Bed level   Upper Body Bathing: Moderate assistance;Bed level   Lower Body Bathing: Total assistance;Bed level   Upper Body Dressing : Moderate assistance;Bed level   Lower Body Dressing: Total assistance       Toileting- Clothing Manipulation and Hygiene: Total assistance;Bed level         General ADL Comments: pt reports fatiguing easily     Vision Baseline Vision/History: 1 Wears glasses Ability to See in Adequate Light: 0 Adequate Patient Visual Report: No change from baseline       Perception     Praxis      Pertinent  Vitals/Pain Pain Assessment Pain Assessment: Faces Faces Pain Scale: Hurts little more Pain Location: Abdominal discomfort that worsened with long sitting in bed Pain Descriptors / Indicators: Discomfort Pain Intervention(s): Monitored during session, Repositioned, Limited activity within patient's tolerance     Hand Dominance Right   Extremity/Trunk Assessment Upper Extremity Assessment Upper Extremity Assessment: Generalized weakness   Lower Extremity Assessment Lower Extremity Assessment: Defer to PT  evaluation RLE Deficits / Details: Able to move hip in all planes and extend knee against gravity LLE Deficits / Details: Able to move hip in all planes and extend knee against gravity   Cervical / Trunk Assessment Cervical / Trunk Assessment: Other exceptions Cervical / Trunk Exceptions: Large body habitus   Communication Communication Communication: Expressive difficulties   Cognition Arousal/Alertness: Awake/alert Behavior During Therapy: WFL for tasks assessed/performed Overall Cognitive Status: No family/caregiver present to determine baseline cognitive functioning                                 General Comments: At times difficult to asses due to difficulty understanding pt's speech; some inconsistencies in pt's reported PLOF     General Comments  Session conducted on 4 L supplemental O2 and O2 sats remained at or above 91%    Exercises     Shoulder Instructions      Home Living Family/patient expects to be discharged to:: Skilled nursing facility                                 Additional Comments: Resident of [facility] for approx 2 years      Prior Functioning/Environment Prior Level of Function : Needs assist             Mobility Comments: Unclear historian; Reports needs assist for OOB to wheelchair transfers, but that she also sometimes gets up by herself and walks to her bathroom (assumed short distance) by herself; Reports she enjoys going in her wheelchair to the patio; propels wheelchair with UEs and LEs ADLs Comments: Pt states that she was Ind with ADLs/selfcare however she reported to PT earlier that staff assisted her        OT Problem List: Decreased strength;Decreased activity tolerance;Impaired balance (sitting and/or standing)      OT Treatment/Interventions: Self-care/ADL training;Therapeutic exercise;Therapeutic activities;Patient/family education    OT Goals(Current goals can be found in the care plan  section) Acute Rehab OT Goals Patient Stated Goal: "get well" OT Goal Formulation: With patient Time For Goal Achievement: 04/12/22 Potential to Achieve Goals: Good ADL Goals Pt Will Perform Grooming: with min assist;with min guard assist;sitting Pt Will Perform Upper Body Bathing: with min assist;with min guard assist;sitting Pt Will Perform Lower Body Bathing: with max assist;with mod assist;sitting/lateral leans Pt Will Perform Upper Body Dressing: with min assist;with min guard assist;sitting Pt Will Transfer to Toilet: with max assist;with mod assist;with +2 assist;stand pivot transfer  OT Frequency: Min 2X/week    Co-evaluation              AM-PAC OT "6 Clicks" Daily Activity     Outcome Measure Help from another person eating meals?: None Help from another person taking care of personal grooming?: A Little Help from another person toileting, which includes using toliet, bedpan, or urinal?: Total Help from another person bathing (including washing, rinsing, drying)?: A Lot Help from another person to put on  and taking off regular upper body clothing?: A Lot Help from another person to put on and taking off regular lower body clothing?: Total 6 Click Score: 13   End of Session    Activity Tolerance: Patient limited by fatigue Patient left: in bed  OT Visit Diagnosis: Other abnormalities of gait and mobility (R26.89);Muscle weakness (generalized) (M62.81)                Time: 3868-5488 OT Time Calculation (min): 18 min Charges:  OT General Charges $OT Visit: 1 Visit OT Evaluation $OT Eval Moderate Complexity: 1 Mod    Britt Bottom 03/29/2022, 12:08 PM

## 2022-03-29 NOTE — Progress Notes (Signed)
O2 sat 88% on O2 at 2 L via N/C, O2 increased to 3 L via N/C, O2 sat 92%, no distress noted,  Glenda Boone

## 2022-03-30 DIAGNOSIS — F17210 Nicotine dependence, cigarettes, uncomplicated: Secondary | ICD-10-CM | POA: Diagnosis not present

## 2022-03-30 DIAGNOSIS — J441 Chronic obstructive pulmonary disease with (acute) exacerbation: Secondary | ICD-10-CM | POA: Diagnosis not present

## 2022-03-30 LAB — CBC
HCT: 31.1 % — ABNORMAL LOW (ref 36.0–46.0)
Hemoglobin: 9.9 g/dL — ABNORMAL LOW (ref 12.0–15.0)
MCH: 31.3 pg (ref 26.0–34.0)
MCHC: 31.8 g/dL (ref 30.0–36.0)
MCV: 98.4 fL (ref 80.0–100.0)
Platelets: 235 10*3/uL (ref 150–400)
RBC: 3.16 MIL/uL — ABNORMAL LOW (ref 3.87–5.11)
RDW: 15.6 % — ABNORMAL HIGH (ref 11.5–15.5)
WBC: 13.6 10*3/uL — ABNORMAL HIGH (ref 4.0–10.5)
nRBC: 0 % (ref 0.0–0.2)

## 2022-03-30 LAB — GLUCOSE, CAPILLARY
Glucose-Capillary: 102 mg/dL — ABNORMAL HIGH (ref 70–99)
Glucose-Capillary: 169 mg/dL — ABNORMAL HIGH (ref 70–99)
Glucose-Capillary: 241 mg/dL — ABNORMAL HIGH (ref 70–99)
Glucose-Capillary: 257 mg/dL — ABNORMAL HIGH (ref 70–99)

## 2022-03-30 LAB — BASIC METABOLIC PANEL
Anion gap: 7 (ref 5–15)
BUN: 35 mg/dL — ABNORMAL HIGH (ref 8–23)
CO2: 29 mmol/L (ref 22–32)
Calcium: 8.5 mg/dL — ABNORMAL LOW (ref 8.9–10.3)
Chloride: 104 mmol/L (ref 98–111)
Creatinine, Ser: 1.44 mg/dL — ABNORMAL HIGH (ref 0.44–1.00)
GFR, Estimated: 39 mL/min — ABNORMAL LOW (ref >60)
Glucose, Bld: 125 mg/dL — ABNORMAL HIGH (ref 70–99)
Potassium: 4.2 mmol/L (ref 3.5–5.1)
Sodium: 140 mmol/L (ref 135–145)

## 2022-03-30 MED ORDER — FUROSEMIDE 10 MG/ML IJ SOLN
40.0000 mg | Freq: Once | INTRAMUSCULAR | Status: AC
  Administered 2022-03-30: 40 mg via INTRAVENOUS
  Filled 2022-03-30: qty 4

## 2022-03-30 MED ORDER — TOPIRAMATE 25 MG PO TABS
50.0000 mg | ORAL_TABLET | Freq: Two times a day (BID) | ORAL | Status: DC
  Administered 2022-03-30 – 2022-04-03 (×8): 50 mg via ORAL
  Filled 2022-03-30 (×9): qty 2

## 2022-03-30 MED ORDER — BUPROPION HCL ER (SR) 100 MG PO TB12
100.0000 mg | ORAL_TABLET | Freq: Every day | ORAL | Status: DC
  Administered 2022-03-30 – 2022-04-03 (×5): 100 mg via ORAL
  Filled 2022-03-30 (×5): qty 1

## 2022-03-30 MED ORDER — METOPROLOL SUCCINATE ER 25 MG PO TB24
12.5000 mg | ORAL_TABLET | Freq: Every day | ORAL | Status: DC
  Administered 2022-03-30 – 2022-04-02 (×4): 12.5 mg via ORAL
  Filled 2022-03-30 (×4): qty 1

## 2022-03-30 MED ORDER — HYDROXYZINE HCL 10 MG PO TABS
10.0000 mg | ORAL_TABLET | Freq: Once | ORAL | Status: AC
  Administered 2022-03-30: 10 mg via ORAL
  Filled 2022-03-30: qty 1

## 2022-03-30 NOTE — Progress Notes (Signed)
Pt stated she could not breathe with CPAP on. Pt wanted CPAP off, so this RN removed and put [place] back on pt. RN will continue to monitor.   Foster Simpson Amrit Cress

## 2022-03-30 NOTE — Progress Notes (Signed)
Pt is very anxious and tearful. RN messaged MD on call to see if pt can have something else for anxiety.   Foster Simpson Dalesha Stanback

## 2022-03-30 NOTE — TOC Initial Note (Signed)
Transition of Care [facility]) - Initial/Assessment Note    Patient Details  Name: Glenda Boone MRN: [id number] Date of Birth: 25-Jul-1950  Transition of Care [facility]) CM/SW Contact:    Milas Gain, [place] Phone Number: 03/30/2022, 10:09 AM  Clinical Narrative:                  CSW received consult for possible SNF placement at time of discharge. CSW spoke with patient regarding PT recommendation of SNF placement at time of discharge.Patient reports she comes from [place] longterm. Patient expressed understanding of PT recommendation and is agreeable to receive short term rehab at [facility] when medically ready for dc.CSW discussed insurance authorization process with patient.  No further questions reported at this time. CSW called Teacher, music at [place]. CSW LVM and awaiting callback. CSW to continue to follow and assist with discharge planning needs.    Expected Discharge Plan: Skilled Nursing Facility Barriers to Discharge: Continued Medical Work up   Patient Goals and CMS Choice Patient states their goals for this hospitalization and ongoing recovery are:: SNF CMS Medicare.gov Compare Post Acute Care list provided to:: Patient Choice offered to / list presented to : Patient  Expected Discharge Plan and Services Expected Discharge Plan: [place] In-house Referral: Clinical Social Work     Living arrangements for the past 2 months: [place]                                      Prior Living Arrangements/Services Living arrangements for the past 2 months: [place] Lives with:: Facility Resident (From [place] long term) Patient language and need for interpreter reviewed:: Yes Do you feel safe going back to the place where you live?: Yes      Need for Family Participation in Patient Care: Yes (Comment) Care giver support system in place?: Yes (comment)   Criminal Activity/Legal Involvement Pertinent to Current  Situation/Hospitalization: No - Comment as needed  Activities of Daily Living Home Assistive Devices/Equipment: None ADL Screening (condition at time of admission) Patient's cognitive ability adequate to safely complete daily activities?: Yes Is the patient deaf or have difficulty hearing?: No Does the patient have difficulty seeing, even when wearing glasses/contacts?: No Does the patient have difficulty concentrating, remembering, or making decisions?: No Patient able to express need for assistance with ADLs?: Yes Does the patient have difficulty dressing or bathing?: Yes Independently performs ADLs?: No Communication: Independent Dressing (OT): Needs assistance Grooming: Needs assistance Feeding: Independent Bathing: Needs assistance Toileting: Dependent In/Out Bed: Dependent Walks in Home: Needs assistance Does the patient have difficulty walking or climbing stairs?: Yes Weakness of Legs: Both Weakness of Arms/Hands: None  Permission Sought/Granted Permission sought to share information with : Case Manager, Family Supports, Chartered certified accountant granted to share information with : Yes, Verbal Permission Granted  Share Information with NAME: Anderson [place]  Permission granted to share info w AGENCY: SNF  Permission granted to share info w Relationship: sister  Permission granted to share info w Contact Information: Anderson Malta [phone]  Emotional Assessment   Attitude/Demeanor/Rapport: Gracious Affect (typically observed): Calm Orientation: : Oriented to Self, Oriented to Place, Oriented to  Time, Oriented to Situation Alcohol / Substance Use: Not Applicable Psych Involvement: No (comment)  Admission diagnosis:  COPD with acute exacerbation ([place]) [J44.1] Dyspnea, unspecified type [R06.00] Patient Active Problem List   Diagnosis Date Noted   COPD with acute exacerbation ([place])  03/28/2022   Acute on chronic heart failure with preserved ejection fraction  (HFpEF) ([place]) 03/28/2022   Acquired thrombophilia ([place]) 02/12/2020   Current use of long term anticoagulation 02/12/2020   Current every day smoker 02/12/2020   Atrial fibrillation with rapid ventricular response ([place]) 02/11/2020   Hyperthyroidism 02/11/2020   Pneumonia due to COVID-19 virus 01/31/2020   COPD (chronic obstructive pulmonary disease) ([place]) 01/31/2020   Essential hypertension 01/31/2020   Hypoxia 01/31/2020   Obesity, Class III, BMI 40-49.9 (morbid obesity) ([place]) 01/31/2020   Acute respiratory failure with hypoxia (HCC)    Type 2 diabetes mellitus with hyperlipidemia ([place])    Hypothyroidism    Acute metabolic encephalopathy 99/35/7017   TIA (transient ischemic attack) 09/04/2019   MDD (major depressive disorder), recurrent, in full remission ([place]) 04/25/2019   Aphagia 03/09/2019   Shingles outbreak 06/21/2015   Chronic bronchitis ([place]) 06/21/2015   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[place]) 11/28/2014   H/O diabetes mellitus 11/28/2014   Anxiety, generalized 11/28/2014   H/O hypercholesterolemia 11/28/2014   H/O: HTN (hypertension) 11/28/2014   H/O: hypothyroidism 11/28/2014   Depression, major, recurrent, moderate ([place]) 11/28/2014   H/O: obesity 11/28/2014   H/O disease 11/28/2014   Neuropathy 11/28/2014   PCP:  [facility] [place]:   [place], [place] - [address] [place] [address] Phone: [phone] Fax: [phone]     Social Determinants of Health (SDOH) Interventions    Readmission Risk Interventions    02/02/2020    5:30 PM  Readmission Risk Prevention Plan  Transportation Screening Complete  PCP or Specialist Appt within 3-5 Days Complete  HRI or [place] Complete  Social Work Consult for [place] Planning/Counseling Complete  Palliative Care Screening Not Applicable  Medication Review Press photographer) Complete

## 2022-03-30 NOTE — NC FL2 (Signed)
[place] LEVEL OF CARE SCREENING TOOL     IDENTIFICATION  Patient Name: Glenda Boone Birthdate: 07-Mar-1951 Sex: female Admission Date (Current Location): 03/28/2022  [facility] [place] Number:  Herbalist and Address:  [facility], [place] [address], [place], [place] [address]      Provider Number: [id number]  Attending Physician Name and Address:  Aldine Contes, MD  Relative Name and Phone Number:       Current Level of Care: Hospital Recommended Level of Care: [place] Prior Approval Number:    Date Approved/Denied:   PASRR Number: [id number]  Discharge Plan: SNF    Current Diagnoses: Patient Active Problem List   Diagnosis Date Noted   COPD with acute exacerbation ([place]) 03/28/2022   Acute on chronic heart failure with preserved ejection fraction (HFpEF) ([place]) 03/28/2022   Acquired thrombophilia ([place]) 02/12/2020   Current use of long term anticoagulation 02/12/2020   Current every day smoker 02/12/2020   Atrial fibrillation with rapid ventricular response ([place]) 02/11/2020   Hyperthyroidism 02/11/2020   Pneumonia due to COVID-19 virus 01/31/2020   COPD (chronic obstructive pulmonary disease) ([place]) 01/31/2020   Essential hypertension 01/31/2020   Hypoxia 01/31/2020   Obesity, Class III, BMI 40-49.9 (morbid obesity) ([place]) 01/31/2020   Acute respiratory failure with hypoxia (HCC)    Type 2 diabetes mellitus with hyperlipidemia ([place])    Hypothyroidism    Acute metabolic encephalopathy 38/88/2800   TIA (transient ischemic attack) 09/04/2019   MDD (major depressive disorder), recurrent, in full remission ([place]) 04/25/2019   Aphagia 03/09/2019   Shingles outbreak 06/21/2015   Chronic bronchitis ([place]) 06/21/2015   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[place]) 11/28/2014   H/O diabetes mellitus 11/28/2014   Anxiety, generalized 11/28/2014   H/O hypercholesterolemia 11/28/2014   H/O: HTN (hypertension)  11/28/2014   H/O: hypothyroidism 11/28/2014   Depression, major, recurrent, moderate ([place]) 11/28/2014   H/O: obesity 11/28/2014   H/O disease 11/28/2014   Neuropathy 11/28/2014    Orientation RESPIRATION BLADDER Height & Weight     Self, Time, Situation, Place  O2 (4 liters Nasal Cannula) Incontinent Weight:   Height:     BEHAVIORAL SYMPTOMS/MOOD NEUROLOGICAL BOWEL NUTRITION STATUS      Incontinent Diet (Please see discharge summary)  AMBULATORY STATUS COMMUNICATION OF NEEDS Skin   Extensive Assist Verbally Other (Comment) (Appropriate for ethnicity,dry,flaky,erythema,buttocks,Bilateral,non-tenting)                       Personal Care Assistance Level of Assistance  Bathing, Feeding, Dressing Bathing Assistance: Maximum assistance Feeding assistance: Independent Dressing Assistance: Maximum assistance     Functional Limitations Info  Sight, Hearing, Speech Sight Info: Adequate (WDL) Hearing Info: Adequate Speech Info: Adequate    SPECIAL CARE FACTORS FREQUENCY  PT (By licensed PT), OT (By licensed OT)     PT Frequency: 5x min weekly OT Frequency: 5x min weekly            Contractures Contractures Info: Not present    Additional Factors Info  Code Status, Allergies, Psychotropic, Insulin Sliding Scale Code Status Info: DNR Allergies Info: Codeine,Shellfish Allergy,Demerol (meperidine Hcl),Gabapentin Psychotropic Info: busPIRone (BUSPAR) tablet 10 mg 2 times daily Insulin Sliding Scale Info: insulin aspart (novoLOG) injection 0-5 Units daily at bedtime,insulin aspart (novoLOG) injection 0-9 Units 3 times daily with meals       Current Medications (03/30/2022):  This is the current hospital active medication list Current Facility-Administered Medications  Medication  Dose Route Frequency Provider Last Rate Last Admin   acetaminophen (TYLENOL) tablet 650 mg  650 mg Oral Q6H PRN Iona Beard, MD   650 mg at 03/30/22 6213   Or   acetaminophen (TYLENOL)  suppository 650 mg  650 mg Rectal Q6H PRN Iona Beard, MD       albuterol (PROVENTIL) (2.5 MG/3ML) 0.083% nebulizer solution 2.5 mg  2.5 mg Nebulization Q4H PRN Axel Filler, MD       apixaban Arne Cleveland) tablet 5 mg  5 mg Oral BID Iona Beard, MD   5 mg at 03/30/22 0813   busPIRone (BUSPAR) tablet 10 mg  10 mg Oral BID Iona Beard, MD   10 mg at 03/30/22 0813   furosemide (LASIX) injection 40 mg  40 mg Intravenous Once Farrel Gordon, DO       hydrOXYzine (ATARAX) tablet 10 mg  10 mg Oral BID PRN Iona Beard, MD   10 mg at 03/29/22 2205   insulin aspart (novoLOG) injection 0-5 Units  0-5 Units Subcutaneous QHS Nooruddin, Marlene Lard, MD   3 Units at 03/28/22 2232   insulin aspart (novoLOG) injection 0-9 Units  0-9 Units Subcutaneous TID WC Nooruddin, Saad, MD   3 Units at 03/29/22 1638   ipratropium-albuterol (DUONEB) 0.5-2.5 (3) MG/3ML nebulizer solution 3 mL  3 mL Nebulization BID Axel Filler, MD   3 mL at 03/30/22 0856   levothyroxine (SYNTHROID) tablet 175 mcg  175 mcg Oral Q0600 Iona Beard, MD   175 mcg at 03/30/22 0865   mometasone-formoterol (DULERA) 200-5 MCG/ACT inhaler 2 puff  2 puff Inhalation BID Iona Beard, MD   2 puff at 03/30/22 0817   nicotine (NICODERM CQ - dosed in mg/24 hours) patch 21 mg  21 mg Transdermal Daily Iona Beard, MD   21 mg at 03/30/22 0815   predniSONE (DELTASONE) tablet 40 mg  40 mg Oral Q breakfast Iona Beard, MD   40 mg at 03/30/22 0813   simvastatin (ZOCOR) tablet 40 mg  40 mg Oral q1800 Iona Beard, MD   40 mg at 03/29/22 1640   sulfamethoxazole-trimethoprim (BACTRIM DS) 800-160 MG per tablet 1 tablet  1 tablet Oral Q12H Linward Natal, MD   1 tablet at 03/30/22 0813   umeclidinium bromide (INCRUSE ELLIPTA) 62.5 MCG/ACT 1 puff  1 puff Inhalation Daily Iona Beard, MD   1 puff at 03/30/22 7846     Discharge Medications: Please see discharge summary for a list of discharge medications.  Relevant Imaging  Results:  Relevant Lab Results:   Additional Information [id number]  Milas Gain, LCSWA

## 2022-03-30 NOTE — Progress Notes (Signed)
Patient refused CPAP. Unit still at bedside.

## 2022-03-30 NOTE — Progress Notes (Addendum)
Subjective:  Glenda Boone is a 71 year old female with a history of COPD, HTN, T2DM, and Afib who presented to the [facility] ED from [place] home complaining of shortness of breath, orthopnea, and a mildly productive cough.   Overnight: Patient with some anxiety. Received 2 doses Atarax 10 mg.  Today on rounds, patient endorses dry throat from her oxygen and asks for something to help with this as she's lost her voice. She states her shortness of breath is much improved. Also endorses resolution of burning with urination. She inquires about how much longer she might be here.   Objective:  Vital signs in last 24 hours: Vitals:   03/29/22 2352 03/30/22 0515 03/30/22 0825 03/30/22 0945  BP:  (!) 152/69  (!) 144/86  Pulse: 64 (!) 52  (!) 122  Resp: 20 20    Temp:  97.6 F (36.4 C)  (!) 97.5 F (36.4 C)  TempSrc:  Oral  Oral  SpO2: 96% (!) 89% 94% 93%   Weight change:   Intake/Output Summary (Last 24 hours) at 03/30/2022 1028 Last data filed at 03/30/2022 0200 Gross per 24 hour  Intake 480 ml  Output 2451 ml  Net -1971 ml   Physical Exam Constitutional:      General: She is not in acute distress.    Appearance: She is obese.  HENT:     Head: Normocephalic and atraumatic.  Eyes:     Extraocular Movements: Extraocular movements intact.  Cardiovascular:     Rate and Rhythm: Normal rate and regular rhythm.     Heart sounds: No murmur heard.    No friction rub. No gallop.  Pulmonary:     Effort: No respiratory distress.     Breath sounds: No wheezing.  Abdominal:     Palpations: Abdomen is soft.     Tenderness: There is no abdominal tenderness.  Musculoskeletal:     Comments: 2+ pitting edema to bilateral lower extremities. Tenderness to moderate palpation. Skin findings consistent with stasis dermatitis.  Skin:    General: Skin is warm and dry.  Neurological:     General: No focal deficit present.     Mental Status: She is alert and oriented to person,  place, and time.  Psychiatric:        Mood and Affect: Mood normal.     Assessment/Plan:  Principal Problem:   COPD with acute exacerbation (HCC) Active Problems:   Essential hypertension   Obesity, Class III, BMI 40-49.9 (morbid obesity) ([place])   Type 2 diabetes mellitus with hyperlipidemia (HCC)   Atrial fibrillation with rapid ventricular response (HCC)   Acute on chronic heart failure with preserved ejection fraction (HFpEF) (HCC)  COPD with Acute Exacerbation, improving Not on O2 at baseline. Patient remains on 4L, sating low-mid 90s. Wheezing absent on exam today. She is tolerating inhaler treatments well. Will optimize patient's home inhaler use upon discharge. - Continue Duoneb (q4H), Dulera (2 puffs BID), and Incruse Ellipta (1 puff daily) - Continue prednisone '40mg'$  QD for 5 days - Continue O2 PRN, goal SpO2 88-92%, will order humidified o2  Acute on Chronic Heart Failure with Preserved Ejection Fraction Do not feel respiratory symptoms are from volume overload, however patient is responding well to diuresis still with lower extremity edema. She feels her breathing and swelling are improving. She is -2.5 L after Lasix 40 mg IV yesterday. Cr improving. - lasix 40 mg IV again today.  - consider SGLT2i if GFR recovers  Acute Uncomplicated  Urinary Tract Infection Urinalysis positive for leukocyte esterase and nitrites, rare bacteria. Urinary symptoms resolved. 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 Creatinine improving with diuresis. - Diuresing as above - Continue to monitor renal function  Atrial Fibrillation CHA2DS2VASc score 5. Rate in 120s today. Asymptomatic. - Continue Eliquis  - metoprolol succinate 12.5 mg daily - rpt ecg  Obstructive Sleep Apnea - Continue to recommend CPAP use at night  Essential Hypertension Mildly hypertensive today. May benefit from [facility] at discharge given  heart failure. - Holding home amlodipine due to lower extremity edema - Consider Entresto at discharge  Type 2 Diabetes Mellitus A1c 7.3% on 11/3. Fasting glucose 102 today. 7 u short acting yesterday. - SSI - consider SGLT2i if GFR recovers - Discontinued home linagliptin '5mg'$  QD  Macrocytic Anemia MCV<100 this admission. She is not on folate or B12 supplementation at home. Possibly with anemia of chronic disease as well. Hgb stable ~10, asymptomatic. No signs of overt bleeding. HDS.  - continue to monitor  Hypothyroidism - Continue levothyroxine 175 mcg QD  Hyperlipidemia - Continue simvastatin '40mg'$  QD   Best Practices: Diet: Carb Modified Fluids: None VTE Prophylaxis: Eliquis for Afib Antibiotics: None Code Status: DNR Attestation for Student Documentation:  I personally was present and performed or re-performed the history, physical exam and medical decision-making activities of this service and have verified that the service and findings are accurately documented in the student's note.  Linward Natal, MD 03/30/2022, 10:28 AM    LOS: 2 days   Linward Natal, MD 03/30/2022, 10:28 AM

## 2022-03-31 DIAGNOSIS — J441 Chronic obstructive pulmonary disease with (acute) exacerbation: Secondary | ICD-10-CM | POA: Diagnosis not present

## 2022-03-31 DIAGNOSIS — F17210 Nicotine dependence, cigarettes, uncomplicated: Secondary | ICD-10-CM | POA: Diagnosis not present

## 2022-03-31 LAB — GLUCOSE, CAPILLARY
Glucose-Capillary: 96 mg/dL (ref 70–99)
Glucose-Capillary: 115 mg/dL — ABNORMAL HIGH (ref 70–99)
Glucose-Capillary: 252 mg/dL — ABNORMAL HIGH (ref 70–99)
Glucose-Capillary: 185 mg/dL — ABNORMAL HIGH (ref 70–99)

## 2022-03-31 LAB — CBC
HCT: 29.9 % — ABNORMAL LOW (ref 36.0–46.0)
Hemoglobin: 9.4 g/dL — ABNORMAL LOW (ref 12.0–15.0)
MCH: 31.1 pg (ref 26.0–34.0)
MCHC: 31.4 g/dL (ref 30.0–36.0)
MCV: 99 fL (ref 80.0–100.0)
Platelets: 231 10*3/uL (ref 150–400)
RBC: 3.02 MIL/uL — ABNORMAL LOW (ref 3.87–5.11)
RDW: 15.8 % — ABNORMAL HIGH (ref 11.5–15.5)
WBC: 11.3 10*3/uL — ABNORMAL HIGH (ref 4.0–10.5)
nRBC: 0 % (ref 0.0–0.2)

## 2022-03-31 LAB — BASIC METABOLIC PANEL
Anion gap: 6 (ref 5–15)
BUN: 40 mg/dL — ABNORMAL HIGH (ref 8–23)
CO2: 30 mmol/L (ref 22–32)
Calcium: 8.3 mg/dL — ABNORMAL LOW (ref 8.9–10.3)
Chloride: 102 mmol/L (ref 98–111)
Creatinine, Ser: 1.61 mg/dL — ABNORMAL HIGH (ref 0.44–1.00)
GFR, Estimated: 34 mL/min — ABNORMAL LOW (ref >60)
Glucose, Bld: 108 mg/dL — ABNORMAL HIGH (ref 70–99)
Potassium: 4.2 mmol/L (ref 3.5–5.1)
Sodium: 138 mmol/L (ref 135–145)

## 2022-03-31 MED ORDER — FUROSEMIDE 40 MG PO TABS
40.0000 mg | ORAL_TABLET | Freq: Every day | ORAL | Status: DC
  Administered 2022-03-31 – 2022-04-02 (×3): 40 mg via ORAL
  Filled 2022-03-31 (×3): qty 1

## 2022-03-31 NOTE — Progress Notes (Signed)
Patient refused CPAP again. Unit pulled out of room.

## 2022-03-31 NOTE — Progress Notes (Addendum)
Subjective:  Ms. Calbert is a 71 year old female with a history of COPD, HTN, T2DM, and Afib who presented from [facility] facility with worsening shortness of breath, orthopnea, and lower extremity edema. She is not on oxygen at baseline. She states she gained approximately 30-40lbs within a two week time span prior to her arrival.  She experienced some anxiety overnight, but otherwise states her symptoms have improved. She denies any tightness in her chest, wheezing, or productive cough. She states she is breathing better with receipt of her breathing treatments.  Objective:  Vital signs in last 24 hours: Vitals:   03/31/22 0540 03/31/22 0757 03/31/22 0830 03/31/22 1031  BP: 137/68 (!) 136/55  120/66  Pulse: 65 65 65 64  Resp: (!) '25 19 20   '$ Temp: 97.8 F (36.6 C) (!) 97.4 F (36.3 C)    TempSrc: Oral Oral    SpO2: 94% 92% 93%   Weight:      Height:       Weight change:   Intake/Output Summary (Last 24 hours) at 03/31/2022 1050 Last data filed at 03/31/2022 0810 Gross per 24 hour  Intake 600 ml  Output 2900 ml  Net -2300 ml   Physical Exam Constitutional:      General: She is not in acute distress.    Appearance: She is obese.  Cardiovascular:     Rate and Rhythm: Normal rate. Rhythm irregular.     Heart sounds: No murmur heard. Pulmonary:     Effort: Pulmonary effort is normal.     Breath sounds: No wheezing or rhonchi.  Musculoskeletal:     Right lower leg: Edema (non-pitting, improved form prior exam) present.     Left lower leg: Edema (non-pitting, improved form prior exam) present.  Skin:    Comments: Bilateral lower extremity skin changes consistent with stasis dermatitis   Neurological:     Mental Status: She is alert and oriented to person, place, and time.  Psychiatric:        Mood and Affect: Mood is anxious.    Results for orders placed or performed during the hospital encounter of 03/28/22 (from the past 24 hour(s))  Glucose, capillary      Status: Abnormal   Collection Time: 03/30/22  4:29 PM  Result Value Ref Range   Glucose-Capillary 241 (H) 70 - 99 mg/dL  Glucose, capillary     Status: Abnormal   Collection Time: 03/30/22  8:45 PM  Result Value Ref Range   Glucose-Capillary 257 (H) 70 - 99 mg/dL  CBC     Status: Abnormal   Collection Time: 03/31/22  4:23 AM  Result Value Ref Range   WBC 11.3 (H) 4.0 - 10.5 K/uL   RBC 3.02 (L) 3.87 - 5.11 MIL/uL   Hemoglobin 9.4 (L) 12.0 - 15.0 g/dL   HCT 29.9 (L) 36.0 - 46.0 %   MCV 99.0 80.0 - 100.0 fL   MCH 31.1 26.0 - 34.0 pg   MCHC 31.4 30.0 - 36.0 g/dL   RDW 15.8 (H) 11.5 - 15.5 %   Platelets 231 150 - 400 K/uL   nRBC 0.0 0.0 - 0.2 %  Basic metabolic panel     Status: Abnormal   Collection Time: 03/31/22  4:23 AM  Result Value Ref Range   Sodium 138 135 - 145 mmol/L   Potassium 4.2 3.5 - 5.1 mmol/L   Chloride 102 98 - 111 mmol/L   CO2 30 22 - 32 mmol/L   Glucose, Bld  108 (H) 70 - 99 mg/dL   BUN 40 (H) 8 - 23 mg/dL   Creatinine, Ser 1.61 (H) 0.44 - 1.00 mg/dL   Calcium 8.3 (L) 8.9 - 10.3 mg/dL   GFR, Estimated 34 (L) >60 mL/min   Anion gap 6 5 - 15  Glucose, capillary     Status: None   Collection Time: 03/31/22  7:17 AM  Result Value Ref Range   Glucose-Capillary 96 70 - 99 mg/dL    Echocardiogram 03/28/22: Left ventricular ejection fraction, by estimation, is >75%. The left ventricle has hyperdynamic function. The left ventricle has no regional wall motion abnormalities. There is moderate concentric left ventricular hypertrophy. Left ventricular diastolic parameters are consistent with Grade I diastolic dysfunction (impaired relaxation).  Right ventricular systolic function is normal. The right ventricular size is normal.  The mitral valve is normal in structure. No evidence of mitral valve regurgitation. No evidence of mitral stenosis. The aortic valve has an indeterminant number of cusps. Aortic valve regurgitation is not visualized. No aortic stenosis is present.    Assessment/Plan:  Principal Problem:   COPD with acute exacerbation (HCC) Active Problems:   Essential hypertension   Obesity, Class III, BMI 40-49.9 (morbid obesity) (HCC)   Type 2 diabetes mellitus with hyperlipidemia (HCC)   Atrial fibrillation with rapid ventricular response (HCC)   Acute on chronic heart failure with preserved ejection fraction (HFpEF) (HCC)  COPD with Acute Exacerbation Not on oxygen at baseline. Dyspnea is improving, but patient remains on 3L [place]. Wheezing absent on exam. She is demonstrating benefit from inhaler therapies. Will optimize patient's home inhaler use on discharge. Expect further improvement with management of newly diagnosed heart failure. - Continue Duoneb (q4H), Dulera (2 puffs BID), and Incruse Ellipta (1 puff daily) - Continue prednisone '40mg'$  QD; currently on day 3 of 5 - Wean oxygen as tolerated, goal SpO2 88-92%  Acute on Chronic Heart Failure with Preserved Ejection Fraction Lower extremity edema has significantly improved and she is currently without pitting. Responding well to diuresis. 5.9L net loss since admission.  - Swap IV lasix to PO - Work with pharmacy to clarify medication list to ensure patient is being treated appropriately for contributing factors (HTN, Afib, DM) prior to discharge.  Acute Uncomplicated Urinary Tract Infection Urinalysis positive for leukocyte esterase and nitrites, rare bacteria. Dysuria has resolved.  - Finish TMP-SMX 1 tab BID; currently on day 3 of 3  Chronic Kidney Disease Cr 1.61 - higher compared to yesterday. - Diuresing as above - Continue to monitor renal function  Atrial Fibrillation CHA2DS2VASc score 5. EKG demonstrated Afib with RVR on 11/5. Patient was started on metoprolol succinate - Continue metoprolol succinate 12.'5mg'$  daily - Continue Eliquis 5 mg bid - May switch to amiodarone pending clarification of home med list  Obstructive Sleep Apnea - Continue to recommend CPAP at  night  Essential Hypertension Mild hypertension throughout stay. Last BP 120/66 - may coincide with metoprolol use. - May restart losartan for prevention of worsening kidney function once med list has been clarified  Type 2 Diabetes Mellitus A1c 7.3% on 11/3.  - Continue sliding scale insulin nad blood glucose monitoring - Consider SGLT2i in combination with linagliptin once med list has been clarified. - Optimize home medications prior to discharge  Anemia MCV 101.8 on admission, but <99 since. May not be macrocytic in nature as previously thought. May be due to chronic disease. Hct 9.4 and stable today. No overt signs of bleeding.  - Continue to monitor  Hypothyroidism - Continue levothyroxine 139mg QD  Hyperlipidemia - Continue simvastatin '40mg'$  QD  Best Practices: Diet: Carb modified Fluids: None VTE Prophylaxis: Eliquis for Afib Antibiotics: TMP-SMX (day 3 of 3) Code Status: DNR   LOS: 3 days   ADolores Frame Medical Student 03/31/2022, 10:50 AM   --  SSherlyn Lickis a 71y.o. female with pertinent past medical history of COPD, HTN, T2DM, and atrial fibrillation currently admitted for new diagnosis of HFpEF with acute exacerbation in addition to COPD exacerbation.   She is net -5L this hospital stay with IV diuresis thus far; unable to trend weights as only one has been measured thus far. Her respiratory status is improved compared to previous days and she is tolerating slow titration down of supplemental oxygen (not on any at home). She has trace pitting edema at most to her distal lower extremities. Currently managing COPD exacerbation with Duonebs, Dulera, Incruse Ellipta, prednisone; no indication for antibiotic therapy at this time. For new diagnosis of HFpEF with exacerbation we are continuing diuresis, now on PO lasix from previous therapy with IV.   She is completing therapy for UTI today. There was concern for AKI on CKD 3a versus progression of CKD stage 3; with  diuresis she has had mild decrease in renal function. Likely due to the diuresis, but will need to trend further to adequately assess AKI versus progression of known disease.  She has a history of atrial fibrillation and had RVR 11/05 which was responsive to metoprolol. It is unclear what her home medication regimen is, and we are working to clarify this before making further changes, specifically regarding amiodarone. She will remain on metoprolol for rate control while our team works to clarify her medications.  We will continue to manage blood glucose with SSI while inpatient. Can consider changing this to an SGLT2 inhibitor on discharge in light of new HFpEF diagnosis.   Remainder of chronic conditions including macrocytic anemia, hypothyroidism, and hyperlipidemia being managed as indicated.  As of 13:39: Our pharmacy team has assisted in confirming her home medications, listed below: Eliquis 5 mg BID Amlodipine 2.5 mg BID HCTZ 25 mg daily Bupropion SR 100 mg daily Buspar 10 mg BID Buspar 5 mg BID PRN Hydroxyzine 10 mg BID PRN for 10 days, end date 04/06/2022  She is not on amiodarone or lasix at home.  Attestation for Student Documentation: I personally was present and performed or re-performed the history, physical exam and medical decision-making activities of this service and have verified that the service and findings are accurately documented in the student's note.  DFarrel Gordon DO 03/31/2022, 12:11 PM

## 2022-04-01 LAB — BASIC METABOLIC PANEL
Anion gap: 9 (ref 5–15)
BUN: 39 mg/dL — ABNORMAL HIGH (ref 8–23)
CO2: 29 mmol/L (ref 22–32)
Calcium: 8.8 mg/dL — ABNORMAL LOW (ref 8.9–10.3)
Chloride: 102 mmol/L (ref 98–111)
Creatinine, Ser: 1.47 mg/dL — ABNORMAL HIGH (ref 0.44–1.00)
GFR, Estimated: 38 mL/min — ABNORMAL LOW (ref >60)
Glucose, Bld: 200 mg/dL — ABNORMAL HIGH (ref 70–99)
Potassium: 4.1 mmol/L (ref 3.5–5.1)
Sodium: 140 mmol/L (ref 135–145)

## 2022-04-01 LAB — CBC
HCT: 31.5 % — ABNORMAL LOW (ref 36.0–46.0)
Hemoglobin: 9.9 g/dL — ABNORMAL LOW (ref 12.0–15.0)
MCH: 30.9 pg (ref 26.0–34.0)
MCHC: 31.4 g/dL (ref 30.0–36.0)
MCV: 98.4 fL (ref 80.0–100.0)
Platelets: 238 10*3/uL (ref 150–400)
RBC: 3.2 MIL/uL — ABNORMAL LOW (ref 3.87–5.11)
RDW: 15.7 % — ABNORMAL HIGH (ref 11.5–15.5)
WBC: 12.4 10*3/uL — ABNORMAL HIGH (ref 4.0–10.5)
nRBC: 0 % (ref 0.0–0.2)

## 2022-04-01 LAB — GLUCOSE, CAPILLARY
Glucose-Capillary: 103 mg/dL — ABNORMAL HIGH (ref 70–99)
Glucose-Capillary: 126 mg/dL — ABNORMAL HIGH (ref 70–99)
Glucose-Capillary: 223 mg/dL — ABNORMAL HIGH (ref 70–99)
Glucose-Capillary: 193 mg/dL — ABNORMAL HIGH (ref 70–99)

## 2022-04-01 MED ORDER — DAPAGLIFLOZIN PROPANEDIOL 5 MG PO TABS
5.0000 mg | ORAL_TABLET | Freq: Every day | ORAL | Status: DC
  Administered 2022-04-01 – 2022-04-03 (×3): 5 mg via ORAL
  Filled 2022-04-01 (×3): qty 1

## 2022-04-01 NOTE — Progress Notes (Signed)
O2 sat 90-91 % on R/A, no distress noted, will cont. To monitor, MD aware  Anastasio Auerbach

## 2022-04-01 NOTE — Progress Notes (Signed)
Physical Therapy Treatment Patient Details Name: Glenda Boone MRN: [id number] DOB: [birth year] Today's Date: 04/01/2022   History of Present Illness 71 year old person admitted with COPD exacerbation. She reports several upper respiratory viral infections over the last 2 weeks, says she had a diagnosis of acute bronchitis. Patient lives in an assisted living facility for last 3 years.living with severe obesity, atrial fibrillation, CKD stage IIIa    PT Comments    Continuing work on functional mobility and activity tolerance;  Session focused on functional transfers; Pt very much wanting OOB to recliner, and we successfully help her up with 2 person up to mod assist (3rd person present for pt's confidence)   Recommendations for follow up therapy are one component of a multi-disciplinary discharge planning process, led by the attending physician.  Recommendations may be updated based on patient status, additional functional criteria and insurance authorization.  Follow Up Recommendations  Skilled nursing-short term rehab (<3 hours/day) (REcommend PT services at her SNF) Can patient physically be transported by private vehicle: No   Assistance Recommended at Discharge Intermittent Supervision/Assistance  Patient can return home with the following     Equipment Recommendations  Other (comment) (TBD at SNF)    Recommendations for Other Services       Precautions / Restrictions Precautions Precautions: Fall Restrictions Weight Bearing Restrictions: No     Mobility  Bed Mobility Overal bed mobility: Needs Assistance Bed Mobility: Supine to Sit     Supine to sit: Mod assist     General bed mobility comments: mod handheld A with trunk elevation and heavy use of bedrails; noted pt tends to hold breath    Transfers Overall transfer level: Needs assistance Equipment used: 2 person hand held assist Transfers: Bed to chair/wheelchair/BSC     Step pivot transfers: Mod assist,  +2 safety/equipment, +2 physical assistance       General transfer comment: Up to mod assist for intial lift off into standing at EOB; bil handheld support as pt took small, shuffle-like steps bed to recliner    Ambulation/Gait                   Stairs             Wheelchair Mobility    Modified Rankin (Stroke Patients Only)       Balance                                            Cognition Arousal/Alertness: Awake/alert Behavior During Therapy: WFL for tasks assessed/performed Overall Cognitive Status: No family/caregiver present to determine baseline cognitive functioning                                 General Comments: At times difficult to asses due to difficulty understanding pt's speech; some inconsistencies in pt's reported PLOF        Exercises      General Comments General comments (skin integrity, edema, etc.): Session conducted on 2 L supplemental O2; noted 3/4 DOE briefly; eased back down to relatively normal breathing once realxed in chair      Pertinent Vitals/Pain Pain Assessment Pain Assessment: Faces Faces Pain Scale: Hurts a little bit Pain Location: Abdominal discomfort that worsened with long sitting in bed Pain Descriptors / Indicators: Discomfort Pain Intervention(s): Monitored during session  Home Living                          Prior Function            PT Goals (current goals can now be found in the care plan section) Acute Rehab PT Goals Patient Stated Goal: Be able to get OOB PT Goal Formulation: With patient Time For Goal Achievement: 04/12/22 Potential to Achieve Goals: Good Progress towards PT goals: Progressing toward goals    Frequency    Min 2X/week      PT Plan Current plan remains appropriate    Co-evaluation              AM-PAC PT "6 Clicks" Mobility   Outcome Measure  Help needed turning from your back to your side while in a flat bed  without using bedrails?: A Lot Help needed moving from lying on your back to sitting on the side of a flat bed without using bedrails?: A Little Help needed moving to and from a bed to a chair (including a wheelchair)?: Total Help needed standing up from a chair using your arms (e.g., wheelchair or bedside chair)?: A Lot Help needed to walk in hospital room?: Total Help needed climbing 3-5 steps with a railing? : Total 6 Click Score: 10    End of Session Equipment Utilized During Treatment: Oxygen;Gait belt (bed pads; rolled flat sheet for) Activity Tolerance: Patient tolerated treatment well Patient left: in chair;with call bell/phone within reach Nurse Communication: Mobility status PT Visit Diagnosis: Other abnormalities of gait and mobility (R26.89);Muscle weakness (generalized) (M62.81)     Time: 9150-5697 PT Time Calculation (min) (ACUTE ONLY): 25 min  Charges:  $Therapeutic Activity: 23-37 mins                     Roney Marion, PT  Acute Rehabilitation Services Office [phone]    [name] 04/01/2022, 12:40 PM

## 2022-04-01 NOTE — TOC Progression Note (Signed)
Transition of Care [facility]) - Initial/Assessment Note    Patient Details  Name: Glenda Boone MRN: [id number] Date of Birth: 09/12/1950  [facility]) CM/SW Contact:    Milinda Antis, [place] Phone Number: 04/01/2022, 4:32 PM  Clinical Narrative:                 LCSW contacted [facility] (SNF) and was informed that the patient can return when medically ready.  Facility informed that MD reports plan to d/c within the "next day or 2".  Facility will begin insurance authorization.    TOC will continue to follow.    Expected Discharge Plan: Skilled Nursing Facility Barriers to Discharge: Continued Medical Work up   Patient Goals and CMS Choice Patient states their goals for this hospitalization and ongoing recovery are:: SNF CMS Medicare.gov Compare Post Acute Care list provided to:: Patient Choice offered to / list presented to : Patient  Expected Discharge Plan and Services Expected Discharge Plan: [facility] In-house Referral: Clinical Social Work     Living arrangements for the past 2 months: [place]                                      Prior Living Arrangements/Services Living arrangements for the past 2 months: [place] Lives with:: Facility Resident (From [facility] long term) Patient language and need for interpreter reviewed:: Yes Do you feel safe going back to the place where you live?: Yes      Need for Family Participation in Patient Care: Yes (Comment) Care giver support system in place?: Yes (comment)   Criminal Activity/Legal Involvement Pertinent to Current Situation/Hospitalization: No - Comment as needed  Activities of Daily Living Home Assistive Devices/Equipment: None ADL Screening (condition at time of admission) Patient's cognitive ability adequate to safely complete daily activities?: Yes Is the patient deaf or have difficulty hearing?: No Does the patient have difficulty seeing, even  when wearing glasses/contacts?: No Does the patient have difficulty concentrating, remembering, or making decisions?: No Patient able to express need for assistance with ADLs?: Yes Does the patient have difficulty dressing or bathing?: Yes Independently performs ADLs?: No Communication: Independent Dressing (OT): Needs assistance Grooming: Needs assistance Feeding: Independent Bathing: Needs assistance Toileting: Dependent In/Out Bed: Dependent Walks in Home: Needs assistance Does the patient have difficulty walking or climbing stairs?: Yes Weakness of Legs: Both Weakness of Arms/Hands: None  Permission Sought/Granted Permission sought to share information with : Case Manager, Family Supports, Chartered certified accountant granted to share information with : Yes, Verbal Permission Granted  Share Information with NAME: Anderson [place]  Permission granted to share info w AGENCY: SNF  Permission granted to share info w Relationship: sister  Permission granted to share info w Contact Information: Anderson Malta [phone]  Emotional Assessment   Attitude/Demeanor/Rapport: Gracious Affect (typically observed): Calm Orientation: : Oriented to Self, Oriented to Place, Oriented to  Time, Oriented to Situation Alcohol / Substance Use: Not Applicable Psych Involvement: No (comment)  Admission diagnosis:  COPD with acute exacerbation ([place]) [J44.1] Dyspnea, unspecified type [R06.00] Patient Active Problem List   Diagnosis Date Noted   COPD with acute exacerbation ([place]) 03/28/2022   Acute on chronic heart failure with preserved ejection fraction (HFpEF) ([place]) 03/28/2022   Acquired thrombophilia ([place]) 02/12/2020   Current use of long term anticoagulation 02/12/2020   Current every day smoker 02/12/2020   Atrial fibrillation with rapid  ventricular response ([place]) 02/11/2020   Hyperthyroidism 02/11/2020   Pneumonia due to COVID-19 virus 01/31/2020   COPD (chronic obstructive pulmonary  disease) ([place]) 01/31/2020   Essential hypertension 01/31/2020   Hypoxia 01/31/2020   Obesity, Class III, BMI 40-49.9 (morbid obesity) ([place]) 01/31/2020   Acute respiratory failure with hypoxia (HCC)    Type 2 diabetes mellitus with hyperlipidemia (HCC)    Hypothyroidism    Acute metabolic encephalopathy 74/94/4967   TIA (transient ischemic attack) 09/04/2019   MDD (major depressive disorder), recurrent, in full remission ([place]) 04/25/2019   Aphagia 03/09/2019   Shingles outbreak 06/21/2015   Chronic bronchitis ([place]) 06/21/2015   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[place]) 11/28/2014   H/O diabetes mellitus 11/28/2014   Anxiety, generalized 11/28/2014   H/O hypercholesterolemia 11/28/2014   H/O: HTN (hypertension) 11/28/2014   H/O: hypothyroidism 11/28/2014   Depression, major, recurrent, moderate ([place]) 11/28/2014   H/O: obesity 11/28/2014   H/O disease 11/28/2014   Neuropathy 11/28/2014   PCP:  [facility] [place]:   [place], [place] - [address] [place] [address] Phone: [phone] Fax: [phone]     Social Determinants of Health (SDOH) Interventions    Readmission Risk Interventions    02/02/2020    5:30 PM  Readmission Risk Prevention Plan  Transportation Screening Complete  PCP or Specialist Appt within 3-5 Days Complete  HRI or [place] Complete  Social Work Consult for [place] Planning/Counseling Complete  Palliative Care Screening Not Applicable  Medication Review Press photographer) Complete

## 2022-04-01 NOTE — Plan of Care (Signed)

## 2022-04-01 NOTE — Progress Notes (Addendum)
Subjective:  Glenda Boone is a 71 year old female with a history of COPD, HTN, T2DM, and Afib who presented from [facility] facility with worsening shortness of breath, orthopnea, and lower extremity edema. She is not on oxygen at baseline. She states she gained approximately 30-40lbs within a two week time span prior to her arrival.  Overnight events: NAEO.  Patient feels much better today regarding her shortness of breath. She endorses an issue with getting changed last night. She endorses a good bit of urination with the lasix. She states she typically sits up in bed or in wheelchair throughout the day and wishes to get out of bed today, which we encouraged. Reports she got some anxiety medicine last night but is not anxious currently.   Objective:  Vital signs in last 24 hours: Vitals:   03/31/22 2117 03/31/22 2254 04/01/22 0451 04/01/22 0837  BP:  (!) 150/73 (!) 153/69 129/67  Pulse:  (!) 55 (!) 58 65  Resp:  '18 18 16  '$ Temp:  97.9 F (36.6 C) 98 F (36.7 C) 98 F (36.7 C)  TempSrc:   Oral Oral  SpO2: 92% 91% 94% 94%  Weight:      Height:       Weight change:   Intake/Output Summary (Last 24 hours) at 04/01/2022 1216 Last data filed at 04/01/2022 1100 Gross per 24 hour  Intake 575 ml  Output 4175 ml  Net -3600 ml   Physical Exam Constitutional:      General: She is not in acute distress.    Appearance: She is obese.  Cardiovascular:     Rate and Rhythm: Normal rate. Rhythm irregular.     Heart sounds: No murmur heard. Pulmonary:     Effort: Pulmonary effort is normal.     Breath sounds: No wheezing or rhonchi.  Musculoskeletal:     Right lower leg: Edema (non-pitting) present.     Left lower leg: Edema (non-pitting) present.  Skin:    Comments: Bilateral lower extremity skin changes consistent with stasis dermatitis   Neurological:     Mental Status: She is alert and oriented to person, place, and time.  Psychiatric:        Mood and Affect: Mood is  anxious.       Latest Ref Rng & Units 04/01/2022    3:00 AM 03/31/2022    4:23 AM 03/30/2022    3:32 AM  CMP  Glucose 70 - 99 mg/dL 200  108  125   BUN 8 - 23 mg/dL 39  40  35   Creatinine 0.44 - 1.00 mg/dL 1.47  1.61  1.44   Sodium 135 - 145 mmol/L 140  138  140   Potassium 3.5 - 5.1 mmol/L 4.1  4.2  4.2   Chloride 98 - 111 mmol/L 102  102  104   CO2 22 - 32 mmol/L '29  30  29   '$ Calcium 8.9 - 10.3 mg/dL 8.8  8.3  8.5       Latest Ref Rng & Units 04/01/2022    3:00 AM 03/31/2022    4:23 AM 03/30/2022    3:32 AM  CBC  WBC 4.0 - 10.5 K/uL 12.4  11.3  13.6   Hemoglobin 12.0 - 15.0 g/dL 9.9  9.4  9.9   Hematocrit 36.0 - 46.0 % 31.5  29.9  31.1   Platelets 150 - 400 K/uL 238  231  235    Assessment/Plan:  Principal Problem:   COPD  with acute exacerbation ([place]) Active Problems:   Essential hypertension   Obesity, Class III, BMI 40-49.9 (morbid obesity) (HCC)   Type 2 diabetes mellitus with hyperlipidemia (HCC)   Atrial fibrillation with rapid ventricular response (HCC)   Acute on chronic heart failure with preserved ejection fraction (HFpEF) ([place])  COPD with Acute Exacerbation O2 requirement improving, now 94% on 2L. Patient feels much better today. Will continue to attempt wean to baseline of no O2 requirement. Lungs without significant wheezing on exam. Per pharmacy, patient takes albuterol and trelegy at home.  - Continue Duoneb (q4H), Dulera (2 puffs BID), and Incruse Ellipta (1 puff daily) - Continue prednisone '40mg'$  QD; currently on day 4 of 5 - Wean oxygen as tolerated, goal SpO2 88-92%  Acute on Chronic Heart Failure with Preserved Ejection Fraction Lower extremity edema stable, non pitting. Responding well to diuresis. -7.8L net loss since admission. Cr improving with diuresis. For new diagnosis of HFpEF with exacerbation we are continuing diuresis, now on PO lasix. - Lasix 40 mg po daily - will add [id number] today  Acute Uncomplicated Urinary Tract Infection - TMP-SMX 1  tab BID completed  Chronic Kidney Disease 3b Cr 1.61 -to 1.47 today. Currently receiving Lasix 40 po daily. - Continue to monitor renal function  Atrial Fibrillation Not on amiodarone at previous facility. Rate currently controlled with metoprolol 12.5 mg. Will discontinue with develop concern for bradycardia, HR 40-50s overnight. - Continue Eliquis 5 mg bid  Obstructive Sleep Apnea - Continue to recommend CPAP at night  Essential Hypertension Mild hypertension throughout stay. Last BP however 129/67. - will hold off on beginning anti-hypertensive for now  Type 2 Diabetes Mellitus A1c 7.3% on 11/3. We will continue to manage blood glucose with SSI while inpatient. Glucose labile though not severely elevated. Fasting 103 today.  - Continue sliding scale insulin and blood glucose monitoring - dapagliflozin 5 mg daily  Anemia MCV 101.8 on admission, but <99 since. May not be macrocytic in nature as previously thought. May be due to chronic disease and CKD. Hct 9.4 and stable today. No overt signs of bleeding.  - Continue to monitor  Hypothyroidism - Continue levothyroxine 112mg QD  Hyperlipidemia - Continue simvastatin '40mg'$  QD  Depression Anxiety -Buproprion ER 100 mg daily -Buspar 10 mg bid -Atarax 10 mg bid prn   Best Practices: Diet: Carb modified Fluids: None VTE Prophylaxis: Eliquis for Afib Antibiotics: none Code Status: DNR   LOS: 4 days   WLinward Natal MD 04/01/2022, 12:16 PM

## 2022-04-01 NOTE — Progress Notes (Signed)
Pt. Placed on cont. Pulse ox, O2 sat 85% on R/A with correlating wave form, pt. Placed on 0.5 L via N/C and O2 sat increasing to 87, pt. Placed on 1 L via N/C with O2 sat increasing to 90%  [employer]

## 2022-04-01 NOTE — Care Management Important Message (Signed)
Important Message  Patient Details  Name: Glenda Boone MRN: [id number] Date of Birth: 05/20/1951   Medicare Important Message Given:  Yes     [name] 04/01/2022, 2:51 PM

## 2022-04-02 LAB — CBC
HCT: 32.5 % — ABNORMAL LOW (ref 36.0–46.0)
Hemoglobin: 10.2 g/dL — ABNORMAL LOW (ref 12.0–15.0)
MCH: 31.2 pg (ref 26.0–34.0)
MCHC: 31.4 g/dL (ref 30.0–36.0)
MCV: 99.4 fL (ref 80.0–100.0)
Platelets: 229 10*3/uL (ref 150–400)
RBC: 3.27 MIL/uL — ABNORMAL LOW (ref 3.87–5.11)
RDW: 15.6 % — ABNORMAL HIGH (ref 11.5–15.5)
WBC: 11.9 10*3/uL — ABNORMAL HIGH (ref 4.0–10.5)
nRBC: 0 % (ref 0.0–0.2)

## 2022-04-02 LAB — BASIC METABOLIC PANEL
Anion gap: 9 (ref 5–15)
BUN: 40 mg/dL — ABNORMAL HIGH (ref 8–23)
CO2: 29 mmol/L (ref 22–32)
Calcium: 8.8 mg/dL — ABNORMAL LOW (ref 8.9–10.3)
Chloride: 101 mmol/L (ref 98–111)
Creatinine, Ser: 1.71 mg/dL — ABNORMAL HIGH (ref 0.44–1.00)
GFR, Estimated: 31 mL/min — ABNORMAL LOW (ref >60)
Glucose, Bld: 108 mg/dL — ABNORMAL HIGH (ref 70–99)
Potassium: 4.2 mmol/L (ref 3.5–5.1)
Sodium: 139 mmol/L (ref 135–145)

## 2022-04-02 LAB — GLUCOSE, CAPILLARY
Glucose-Capillary: 107 mg/dL — ABNORMAL HIGH (ref 70–99)
Glucose-Capillary: 150 mg/dL — ABNORMAL HIGH (ref 70–99)
Glucose-Capillary: 284 mg/dL — ABNORMAL HIGH (ref 70–99)
Glucose-Capillary: 157 mg/dL — ABNORMAL HIGH (ref 70–99)

## 2022-04-02 NOTE — Inpatient Diabetes Management (Signed)
Inpatient Diabetes Program Recommendations  AACE/ADA: New Consensus Statement on Inpatient Glycemic Control (2015)  Target Ranges:  Prepandial:   less than 140 mg/dL      Peak postprandial:   less than 180 mg/dL (1-2 hours)      Critically ill patients:  140 - 180 mg/dL   Lab Results  Component Value Date   GLUCAP 107 (H) 04/02/2022   HGBA1C 7.3 (H) 03/28/2022    Review of Glycemic Control  Latest Reference Range & Units 04/01/22 08:29 04/01/22 11:36 04/01/22 16:42 04/01/22 21:10 04/02/22 07:30  Glucose-Capillary 70 - 99 mg/dL 103 (H) 126 (H) 223 (H) 193 (H) 107 (H)   Diabetes history: DM 2 Outpatient Diabetes medications: Tradjenta 2.5 mg Daily Current orders for Inpatient glycemic control:  Farxiga 5 mg Daily Novolog 0-9 units tid + hs  PO Prednisone 40 mg Daily  Inpatient Diabetes Program Recommendations:    Glucose trends increase after meal intake and PO prednisone dose. If steroid dose remains the same...  -  Consider adding Novolog 2 units tid meal coverage if eating at least 50% of meals  Thanks,  Tama Headings RN, MSN, BC-ADM Inpatient Diabetes Coordinator Team Pager [phone] (8a-5p)

## 2022-04-02 NOTE — Plan of Care (Signed)

## 2022-04-02 NOTE — Progress Notes (Signed)
Mobility Specialist Progress Note:   04/02/22 1500  Mobility  Activity  (maximove weight)  Level of Assistance +2 (takes two people)  Assistive Device MaxiMove  Activity Response Tolerated well  $Mobility charge 1 Mobility   RN requesting assistance with obtaining pt weight via maximove. Required maxA to turn R/L for pad placement. Pt tolerated well. Left with all needs met.   Nelta Numbers Acute Rehab Secure Chat or Office Phone: [phone]

## 2022-04-02 NOTE — Progress Notes (Addendum)
Subjective:  Patient was resting comfortably in bed on 2L O2. Attempted trial of room air during visitation and O2 sats dropped to 85%. Patient was left on 1L [place] at departure. Patient stated she is continuing to experience some anxiety despite re-starting home medications. She stated she is breathing better and is now able to lie down without experiencing dyspnea. She has not been experiencing any wheezing, but continues to report occasional coughing.  Objective:  Vital signs in last 24 hours: Vitals:   04/02/22 0523 04/02/22 0837 04/02/22 0838 04/02/22 1201  BP: (!) 144/72 (!) 142/66    Pulse: 67 60    Resp: 16 17    Temp: (!) 97.3 F (36.3 C) 98.4 F (36.9 C)    TempSrc: Oral Oral    SpO2: 96% 96% 94%   Weight:    (!) 160.1 kg  Height:       Weight change:   Intake/Output Summary (Last 24 hours) at 04/02/2022 1508 Last data filed at 04/02/2022 1100 Gross per 24 hour  Intake 1440 ml  Output 3650 ml  Net -2210 ml   Physical Exam Constitutional:      General: She is not in acute distress.    Appearance: She is obese.  Cardiovascular:     Rate and Rhythm: Normal rate. Rhythm irregular.     Heart sounds: No murmur heard.    No friction rub.  Pulmonary:     Effort: Pulmonary effort is normal.     Breath sounds: Examination of the right-lower field reveals rhonchi. Examination of the left-lower field reveals rhonchi. Rhonchi present. No wheezing.  Musculoskeletal:     Right lower leg: Tenderness present. No edema (improved).     Left lower leg: Tenderness present. No edema (improved).  Neurological:     Mental Status: She is alert and oriented to person, place, and time.  Psychiatric:        Mood and Affect: Mood normal.    Results for orders placed or performed during the hospital encounter of 03/28/22 (from the past 24 hour(s))  Glucose, capillary     Status: Abnormal   Collection Time: 04/01/22  4:42 PM  Result Value Ref Range   Glucose-Capillary 223 (H) 70 - 99  mg/dL  Glucose, capillary     Status: Abnormal   Collection Time: 04/01/22  9:10 PM  Result Value Ref Range   Glucose-Capillary 193 (H) 70 - 99 mg/dL  CBC     Status: Abnormal   Collection Time: 04/02/22  6:45 AM  Result Value Ref Range   WBC 11.9 (H) 4.0 - 10.5 K/uL   RBC 3.27 (L) 3.87 - 5.11 MIL/uL   Hemoglobin 10.2 (L) 12.0 - 15.0 g/dL   HCT 32.5 (L) 36.0 - 46.0 %   MCV 99.4 80.0 - 100.0 fL   MCH 31.2 26.0 - 34.0 pg   MCHC 31.4 30.0 - 36.0 g/dL   RDW 15.6 (H) 11.5 - 15.5 %   Platelets 229 150 - 400 K/uL   nRBC 0.0 0.0 - 0.2 %  Basic metabolic panel     Status: Abnormal   Collection Time: 04/02/22  6:45 AM  Result Value Ref Range   Sodium 139 135 - 145 mmol/L   Potassium 4.2 3.5 - 5.1 mmol/L   Chloride 101 98 - 111 mmol/L   CO2 29 22 - 32 mmol/L   Glucose, Bld 108 (H) 70 - 99 mg/dL   BUN 40 (H) 8 - 23 mg/dL  Creatinine, Ser 1.71 (H) 0.44 - 1.00 mg/dL   Calcium 8.8 (L) 8.9 - 10.3 mg/dL   GFR, Estimated 31 (L) >60 mL/min   Anion gap 9 5 - 15  Glucose, capillary     Status: Abnormal   Collection Time: 04/02/22  7:30 AM  Result Value Ref Range   Glucose-Capillary 107 (H) 70 - 99 mg/dL  Glucose, capillary     Status: Abnormal   Collection Time: 04/02/22 11:18 AM  Result Value Ref Range   Glucose-Capillary 150 (H) 70 - 99 mg/dL    Echocardiogram: Left ventricular ejection fraction, by estimation, is >75%. The left ventricle has hyperdynamic function. The left ventricle has no regional wall motion abnormalities. There is moderate concentric left ventricular hypertrophy. Left ventricular diastolic parameters are consistent with Grade I diastolic dysfunction (impaired relaxation).  Right ventricular systolic function is normal. The right ventricular size is normal.  The mitral valve is normal in structure. No evidence of mitral valve regurgitation. No evidence of mitral stenosis. The aortic valve has an indeterminant number of cusps. Aortic valve regurgitation is not  visualized. No aortic stenosis is present.    Assessment/Plan:  Principal Problem:   COPD with acute exacerbation (HCC) Active Problems:   Essential hypertension   Obesity, Class III, BMI 40-49.9 (morbid obesity) (HCC)   Type 2 diabetes mellitus with hyperlipidemia (HCC)   Atrial fibrillation with rapid ventricular response (HCC)   Acute on chronic heart failure with preserved ejection fraction (HFpEF) ([place])  COPD with Acute Exacerbation O2 requirement improving. Able to tolerate 1.5L while in room with patient. Trial of room air caused desaturation. Patient also desaturated while asleep per nursing team. See OSA recommendations below. Will continue to attempt to wean oxygen back to baseline of room air while patient remains in our care. Per pharmacy, patient takes albuterol, trelegy, and tradjenta at home.  - Continue Duoneb (q4H), Dulera (2 puffs BID), and Incruse Ellipta (1 puff daily).  - Plan to discharge to SNF w/ oxygen now that the patient is stable - Wean oxygen as tolerated, goal SpO2 88-92%  Acute on Chronic Heart Failure with Preserved Ejection Fraction Lower extremity edema resolved. Responded well to diuresis with a net loss of 10L since admission.  - Discontinued PO lasix as patient appears to be sufficiently diuresed. Can consider on PRN basis at discharge for changes in weight and swelling.   Obstructive Sleep Apnea Patient was tolerating 1L [place] well while awake, but desaturated to 75% while asleep according to nursing staff. She required 3L while asleep to maintain adequate oxygen saturation. This inability to maintain oxygenation may contribute to patient's nighttime anxiety. - Continue to recommend CPAP at night and inform patient of requirement for supplemental oxygen during sleep in order to prevent apneic events upon discharge.  Acute Uncomplicated Urinary Tract Infection - TMP/SMX 1 tab BID completed  Chronic Kidney Disease 3b Cr increased from 1.47 to 1.7  today. May be due to starting Farxiga.  - Will continue to monitor renal function  Atrial Fibrillation Not on amiodarone at previous facility. Rate currently controlled without medication. - Metoprolol succinate discontinued due to bradycardia - Will continue to monitor rate - Continue Eliquis '5mg'$  BID  Essential Hypertension Mild hypertension throughout stay. 142/66 this morning. - Recommend outpatient follow-up and would recommend losartan if BP remains elevated in the outpatient setting  Type 2 Diabetes Mellitus A1c 7.3% on 11/3. We will continue to manage blood glucose with SSI while inpatient. Glucose labile though not severely elevated.  Fasting 107 today.  - Continue sliding scale insulin and blood glucose monitoring - dapagliflozin 5 mg daily  Anemia MCV 101.8 on admission, but <99 since. May not be macrocytic in nature as previously thought. May be due to chronic disease and CKD. Hgb 10.2 and stable today. No overt signs of bleeding.  - Continue to monitor  Hypothyroidism - Continue levothyroxine 115mg QD  Hyperlipidemia - Continue simvastatin '40mg'$  QD  Depression Anxiety - Bupropion ER '100mg'$  QD - Buspar '10mg'$  BID - Atarax '10mg'$  BID PRN  Best Practices: Diet: Carb-Modified Fluids: None VTE Prophylaxis: Eliquis Antibiotics: None Code Status: DNR    LOS: 5 days   ADolores Frame Medical Student 04/02/2022, 3:08 PM  --  Glenda Boone is a 71y.o. female with pertinent PMH of COPD, HTN, T2DM, and atrial fibrillation currently admitted for new diagnosis of HFpEF with acute exacerbation in addition to COPD exacerbation.   She is net -11 L thus far this hospital stay and 12.7 kg. She reports improvement in her breathing and peripheral edema. Objectively, both of these exam aspects are improved. She is intermittently on supplemental oxygen to maintain O2 saturations 88-92%, though when sleeping her O2 saturations do decrease to lower levels. She has refused CPAP  multiple nights. Will continue her on Duonebs, Dulera, and Incruse Ellipta for now. She may require overnight oxygen therapy as an outpatient, but we will encourage her to use her CPAP. She did have a mild increase in serum creatinine 1.47>1.7 today which could be secondary to initiating Farxiga versus large volume diuresis. Lasix discontinued 11/08 and we will plan to treat with Lasix on a PRN basis.  She has been rate controlled with some bradycardia resulting in discontinuation of metoprolol 11/08.  Other conditions being addressed are chronic in nature including anemia, hypothyroidism, hyperlipidemia, depression, and anxiety. FWilder [place] started as part of diabetes and HFpEF regimen 11/07.   Attestation for Student Documentation: I personally was present and performed or re-performed the history, physical exam and medical decision-making activities of this service and have verified that the service and findings are accurately documented in the student's note.  DFarrel Gordon DO 04/02/2022, 4:46 PM

## 2022-04-02 NOTE — Progress Notes (Signed)
Mobility Specialist Progress Note:   04/02/22 1115  Mobility  Activity  (roll in bed + reposition)  Level of Assistance +2 (takes two people)  Activity Response Tolerated well  Mobility Referral Yes  $Mobility charge 1 Mobility   Pt requesting to bed repositioned in bed. Required +2 assist to turn R/L for pad placement. Pt repositioned to satisfaction. Left with all needs met.   Nelta Numbers Acute Rehab Secure Chat or Office Phone: [phone]

## 2022-04-02 NOTE — TOC Progression Note (Signed)
Transition of Care [facility]) - Initial/Assessment Note    Patient Details  Name: Glenda Boone MRN: [id number] Date of Birth: 1950-11-14  [facility] of [facility]) CM/SW Contact:    Milinda Antis, LCSWA Phone Number: 04/02/2022, 5:52 PM  Clinical Narrative:                 LCSW notified by [facility] (SNF) that insurance authorization is still pending.    LCSW will continue to follow.  Expected Discharge Plan: Skilled Nursing Facility Barriers to Discharge: Continued Medical Work up   Patient Goals and CMS Choice Patient states their goals for this hospitalization and ongoing recovery are:: SNF CMS Medicare.gov Compare Post Acute Care list provided to:: Patient Choice offered to / list presented to : Patient  Expected Discharge Plan and Services Expected Discharge Plan: [place] In-house Referral: Clinical Social Work     Living arrangements for the past 2 months: [place]                                      Prior Living Arrangements/Services Living arrangements for the past 2 months: [place] Lives with:: Facility Resident (From [place] long term) Patient language and need for interpreter reviewed:: Yes Do you feel safe going back to the place where you live?: Yes      Need for Family Participation in Patient Care: Yes (Comment) Care giver support system in place?: Yes (comment)   Criminal Activity/Legal Involvement Pertinent to Current Situation/Hospitalization: No - Comment as needed  Activities of Daily Living Home Assistive Devices/Equipment: None ADL Screening (condition at time of admission) Patient's cognitive ability adequate to safely complete daily activities?: Yes Is the patient deaf or have difficulty hearing?: No Does the patient have difficulty seeing, even when wearing glasses/contacts?: No Does the patient have difficulty concentrating, remembering, or making decisions?: No Patient able to  express need for assistance with ADLs?: Yes Does the patient have difficulty dressing or bathing?: Yes Independently performs ADLs?: No Communication: Independent Dressing (OT): Needs assistance Grooming: Needs assistance Feeding: Independent Bathing: Needs assistance Toileting: Dependent In/Out Bed: Dependent Walks in Home: Needs assistance Does the patient have difficulty walking or climbing stairs?: Yes Weakness of Legs: Both Weakness of Arms/Hands: None  Permission Sought/Granted Permission sought to share information with : Case Manager, Family Supports, Customer service manager Permission granted to share information with : Yes, Verbal Permission Granted  Share Information with NAME: Anderson [place]  Permission granted to share info w AGENCY: SNF  Permission granted to share info w Relationship: sister  Permission granted to share info w Contact Information: Anderson Malta [phone]  Emotional Assessment   Attitude/Demeanor/Rapport: Gracious Affect (typically observed): Calm Orientation: : Oriented to Self, Oriented to Place, Oriented to  Time, Oriented to Situation Alcohol / Substance Use: Not Applicable Psych Involvement: No (comment)  Admission diagnosis:  COPD with acute exacerbation ([place]) [J44.1] Dyspnea, unspecified type [R06.00] Patient Active Problem List   Diagnosis Date Noted   COPD with acute exacerbation ([place]) 03/28/2022   Acute on chronic heart failure with preserved ejection fraction (HFpEF) ([place]) 03/28/2022   Acquired thrombophilia ([place]) 02/12/2020   Current use of long term anticoagulation 02/12/2020   Current every day smoker 02/12/2020   Atrial fibrillation with rapid ventricular response ([place]) 02/11/2020   Hyperthyroidism 02/11/2020   Pneumonia due to COVID-19 virus 01/31/2020   COPD (chronic obstructive pulmonary disease) ([place]) 01/31/2020  Essential hypertension 01/31/2020   Hypoxia 01/31/2020   Obesity, Class III, BMI 40-49.9 (morbid obesity)  ([place]) 01/31/2020   Acute respiratory failure with hypoxia (HCC)    Type 2 diabetes mellitus with hyperlipidemia ([place])    Hypothyroidism    Acute metabolic encephalopathy 62/83/1517   TIA (transient ischemic attack) 09/04/2019   MDD (major depressive disorder), recurrent, in full remission ([place]) 04/25/2019   Aphagia 03/09/2019   Shingles outbreak 06/21/2015   Chronic bronchitis ([place]) 06/21/2015   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[place]) 11/28/2014   H/O diabetes mellitus 11/28/2014   Anxiety, generalized 11/28/2014   H/O hypercholesterolemia 11/28/2014   H/O: HTN (hypertension) 11/28/2014   H/O: hypothyroidism 11/28/2014   Depression, major, recurrent, moderate ([place]) 11/28/2014   H/O: obesity 11/28/2014   H/O disease 11/28/2014   Neuropathy 11/28/2014   PCP:  [facility], [place]:   [place], [place] - [address] [place] [address] Phone: [phone] Fax: [phone]     Social Determinants of Health (SDOH) Interventions    Readmission Risk Interventions    02/02/2020    5:30 PM  Readmission Risk Prevention Plan  Transportation Screening Complete  PCP or Specialist Appt within 3-5 Days Complete  HRI or [place] Complete  Social Work Consult for [place] Planning/Counseling Complete  Palliative Care Screening Not Applicable  Medication Review Press photographer) Complete

## 2022-04-02 NOTE — Progress Notes (Signed)
Occupational Therapy Treatment Patient Details Name: Glenda Boone MRN: [id number] DOB: May 04, 1951 Today's Date: 04/02/2022   History of present illness 71 year old person admitted with COPD exacerbation. She reports several upper respiratory viral infections over the last 2 weeks, says she had a diagnosis of acute bronchitis. Patient lives in an assisted living facility for last 3 years.living with severe obesity, atrial fibrillation, CKD stage IIIa   OT comments  Pt progressing towards established OT goals. Pt requiring significantly increased time for all mobility and observed with decreased activity tolerance. Pt performing step-pivot transfer with min A with second person for safety. Providing education on safe transfers; pt not following during transfer, but after moving to recliner with greater understanding of meaningful hand placement with continued education. Pt desatting as low as 88%, but quickly returning to >90 with rest breaks. Recommending SNF for continued OT services.    Recommendations for follow up therapy are one component of a multi-disciplinary discharge planning process, led by the attending physician.  Recommendations may be updated based on patient status, additional functional criteria and insurance authorization.    Follow Up Recommendations  Skilled nursing-short term rehab (<3 hours/day)    Assistance Recommended at Discharge    Patient can return home with the following  Two people to help with bathing/dressing/bathroom;A lot of help with bathing/dressing/bathroom   Equipment Recommendations  None recommended by OT    Recommendations for Other Services      Precautions / Restrictions Precautions Precautions: Fall Restrictions Weight Bearing Restrictions: No       Mobility Bed Mobility Overal bed mobility: Needs Assistance Bed Mobility: Supine to Sit     Supine to sit: Mod assist     General bed mobility comments: mod handheld A with trunk  elevation and heavy use of bedrails; noted pt tends to hold breath    Transfers Overall transfer level: Needs assistance Equipment used: Rolling walker (2 wheels) Transfers: Bed to chair/wheelchair/BSC       Step pivot transfers: Min assist, +2 safety/equipment     General transfer comment: Min A for initial power up; min A for steadying during steps     Balance Overall balance assessment: Needs assistance Sitting-balance support: No upper extremity supported, Feet supported Sitting balance-Leahy Scale: Fair Sitting balance - Comments: static sitting EOB ~5 mins   Standing balance support: Bilateral upper extremity supported, During functional activity Standing balance-Leahy Scale: Poor                             ADL either performed or assessed with clinical judgement   ADL Overall ADL's : Needs assistance/impaired                         Toilet Transfer: Minimal assistance;+2 for safety/equipment;Stand-pivot;Rolling walker (2 wheels) Toilet Transfer Details (indicate cue type and reason): Second person present for safety. Pt not following commands to push up from bed. Therapy tech holding RW for safety. OT providing min A to boost up from [place] and Hygiene: Total assistance;Sit to/from stand Toileting - Clothing Manipulation Details (indicate cue type and reason): Total A for pericare at this time. Reporting she usually performs at home, but not performing during session.       General ADL Comments: pt reports fatiguing easily; Pt with intermittent garbled speech with dyspnea, but reports this is normal. Asking RN, and RN reporting she has been doing it as well.  Extremity/Trunk Assessment Upper Extremity Assessment Upper Extremity Assessment: Generalized weakness   Lower Extremity Assessment Lower Extremity Assessment: Defer to PT evaluation        Vision       Perception     Praxis      Cognition  Arousal/Alertness: Awake/alert Behavior During Therapy: WFL for tasks assessed/performed Overall Cognitive Status: No family/caregiver present to determine baseline cognitive functioning                                 General Comments: At times difficult to asses due to difficulty understanding pt's speech        Exercises      Shoulder Instructions       General Comments Pt desatting as low as 88% during session    Pertinent Vitals/ Pain       Pain Assessment Pain Assessment: Faces Faces Pain Scale: Hurts a little bit Pain Location: generalized Pain Descriptors / Indicators: Discomfort Pain Intervention(s): Monitored during session, Limited activity within patient's tolerance, Repositioned  Home Living                                          Prior Functioning/Environment              Frequency  Min 2X/week        Progress Toward Goals  OT Goals(current goals can now be found in the care plan section)  Progress towards OT goals: Progressing toward goals  Acute Rehab OT Goals Patient Stated Goal: get to the chair and get cleaned up OT Goal Formulation: With patient Time For Goal Achievement: 04/12/22 Potential to Achieve Goals: Good ADL Goals Pt Will Perform Grooming: with min assist;with min guard assist;sitting Pt Will Perform Upper Body Bathing: with min assist;with min guard assist;sitting Pt Will Perform Lower Body Bathing: with max assist;with mod assist;sitting/lateral leans Pt Will Perform Upper Body Dressing: with min assist;with min guard assist;sitting Pt Will Transfer to Toilet: with max assist;with mod assist;with +2 assist;stand pivot transfer  Plan Discharge plan remains appropriate    Co-evaluation                 AM-PAC OT "6 Clicks" Daily Activity     Outcome Measure   Help from another person eating meals?: None Help from another person taking care of personal grooming?: A Little Help from  another person toileting, which includes using toliet, bedpan, or urinal?: Total Help from another person bathing (including washing, rinsing, drying)?: A Lot Help from another person to put on and taking off regular upper body clothing?: A Lot Help from another person to put on and taking off regular lower body clothing?: Total 6 Click Score: 13    End of Session Equipment Utilized During Treatment: Gait belt;Rolling walker (2 wheels)  OT Visit Diagnosis: Other abnormalities of gait and mobility (R26.89);Muscle weakness (generalized) (M62.81)   Activity Tolerance Patient tolerated treatment well   Patient Left in chair;with call bell/phone within reach;with chair alarm set   Nurse Communication Mobility status;Other (comment) (speech with dyspnea)        Time: 9563-8756 OT Time Calculation (min): 28 min  Charges: OT General Charges $OT Visit: 1 Visit OT Treatments $Self Care/Home Management : 23-37 mins  Shanda Howells, OTR/L [facility] Acute Rehabilitation Office: [phone]   Lula Olszewski 04/02/2022,  5:45 PM

## 2022-04-02 NOTE — Progress Notes (Signed)
O2 sat 75 % on 1 L via N/C while pt. Sleeping, no distress noted, O2 increased to 3 L via N/C with good effect, O2 sat increasing to 91 %, MD aware  Anastasio Auerbach

## 2022-04-03 LAB — CBC
HCT: 31.8 % — ABNORMAL LOW (ref 36.0–46.0)
Hemoglobin: 10.1 g/dL — ABNORMAL LOW (ref 12.0–15.0)
MCH: 31.3 pg (ref 26.0–34.0)
MCHC: 31.8 g/dL (ref 30.0–36.0)
MCV: 98.5 fL (ref 80.0–100.0)
Platelets: 233 10*3/uL (ref 150–400)
RBC: 3.23 MIL/uL — ABNORMAL LOW (ref 3.87–5.11)
RDW: 15.5 % (ref 11.5–15.5)
WBC: 10.2 10*3/uL (ref 4.0–10.5)
nRBC: 0 % (ref 0.0–0.2)

## 2022-04-03 LAB — BASIC METABOLIC PANEL
Anion gap: 10 (ref 5–15)
BUN: 47 mg/dL — ABNORMAL HIGH (ref 8–23)
CO2: 29 mmol/L (ref 22–32)
Calcium: 8.6 mg/dL — ABNORMAL LOW (ref 8.9–10.3)
Chloride: 97 mmol/L — ABNORMAL LOW (ref 98–111)
Creatinine, Ser: 1.7 mg/dL — ABNORMAL HIGH (ref 0.44–1.00)
GFR, Estimated: 32 mL/min — ABNORMAL LOW (ref >60)
Glucose, Bld: 117 mg/dL — ABNORMAL HIGH (ref 70–99)
Potassium: 4.1 mmol/L (ref 3.5–5.1)
Sodium: 136 mmol/L (ref 135–145)

## 2022-04-03 LAB — LIPID PANEL
Cholesterol: 132 mg/dL (ref 0–200)
HDL: 41 mg/dL (ref >40)
LDL Cholesterol: 55 mg/dL (ref 0–99)
Total CHOL/HDL Ratio: 3.2 RATIO
Triglycerides: 178 mg/dL — ABNORMAL HIGH (ref <150)
VLDL: 36 mg/dL (ref 0–40)

## 2022-04-03 LAB — GLUCOSE, CAPILLARY
Glucose-Capillary: 91 mg/dL (ref 70–99)
Glucose-Capillary: 145 mg/dL — ABNORMAL HIGH (ref 70–99)
Glucose-Capillary: 271 mg/dL — ABNORMAL HIGH (ref 70–99)

## 2022-04-03 LAB — T4, FREE: Free T4: 0.86 ng/dL (ref 0.61–1.12)

## 2022-04-03 LAB — TSH: TSH: 10.255 u[IU]/mL — ABNORMAL HIGH (ref 0.350–4.500)

## 2022-04-03 MED ORDER — AMLODIPINE BESYLATE 2.5 MG PO TABS: 2.5000 mg | ORAL_TABLET | Freq: Two times a day (BID) | ORAL | Status: DC

## 2022-04-03 MED ORDER — ACETAMINOPHEN 325 MG PO TABS: 650.0000 mg | ORAL_TABLET | Freq: Four times a day (QID) | ORAL | Status: DC | PRN

## 2022-04-03 MED ORDER — DAPAGLIFLOZIN PROPANEDIOL 5 MG PO TABS: 5.0000 mg | ORAL_TABLET | Freq: Every day | ORAL | Status: DC

## 2022-04-03 NOTE — Discharge Summary (Addendum)
Name: Glenda Boone MRN: [id number] DOB: [birth year] 71 y.o. PCP: [facility], [facility]  Date of Admission: 03/28/2022  1:28 AM Date of Discharge:  04/03/2022 Attending Physician: Dr.  Charise Killian  DISCHARGE DIAGNOSIS:  Primary Problem: COPD with acute exacerbation [facility])   Hospital Problems: Principal Problem:   COPD with acute exacerbation ([place]) Active Problems:   Essential hypertension   Obesity, Class III, BMI 40-49.9 (morbid obesity) ([place])   Type 2 diabetes mellitus with hyperlipidemia (HCC)   Atrial fibrillation with rapid ventricular response (HCC)   Acute on chronic heart failure with preserved ejection fraction (HFpEF) (HCC)   COPD with Acute Exacerbation Recommend continuing home doses of albuterol and Trelegy. Recommend continued oxygen supplementation PRN, goal O2 88-92% Consider referral to pulmonary rehab pending patient's functional status.   Acute on Chronic Heart Failure with Preserved Ejection Fraction Recommend torsemide '20mg'$  PO PRN on outpatient basis. Recommend taking a dose for any of the following: 1) 3lb weight gain within one day, 2) 5lb weight gain within 3 days, or 3) experiencing symptoms of dyspnea or swelling   Obstructive Sleep Apnea Recommend supplemental oxygen at night to avoid desaturation. Patient has declined use of CPAP.   Acute Uncomplicated Urinary Tract Infection Resolved during stay.   Chronic Kidney Disease 3b Recommend continuation of dapagliflozin '5mg'$  PO QD  Atrial Fibrillation Recommend Eliquis '5mg'$  BID for stroke prevention due to elevated CHA2DS2-VASc score of 5.   Essential Hypertension Recommend outpatient follow-up and would recommend losartan if BP remains elevated in the outpatient setting.  Type 2 Diabetes Mellitus Recommend continuing linagliptin '5mg'$  PO QD and dapagliflozin '5mg'$  PO QD.   Anemia Recommend follow-up outpatient. Stable throughout duration of stay.   Hypothyroidism Ensure follow-up on  inpatient TSH and free T4 results prior to administering levothyroxine.   Hyperlipidemia Ensure follow-up on inpatient lipid panel results prior to administering simvastatin.   Depression Anxiety Recommend continuation of home Bupropion ER '100mg'$  QD, Buspar '10mg'$  BID, and Atarax '10mg'$  BID PRN.   DISCHARGE MEDICATIONS:   Allergies as of 04/03/2022       Reactions   Codeine Nausea Only, Other (See Comments)   "Allergic," per verbal MAR   Shellfish-derived Products Hives, Other (See Comments)   "Allergic," per verbal MAR   Gabapentin Other (See Comments)   Gives nightmares- "Allergic," per verbal MAR   Meperidine Hcl Anxiety, Other (See Comments)   "Allergic," per verbal MAR        Medication List     STOP taking these medications    amiodarone 200 MG tablet Commonly known as: PACERONE   Benadryl Allergy 25 MG tablet Generic drug: diphenhydrAMINE   cyclobenzaprine 10 MG tablet Commonly known as: FLEXERIL   docusate sodium 100 MG capsule Commonly known as: COLACE   ibuprofen 400 MG tablet Commonly known as: ADVIL   ibuprofen 600 MG tablet Commonly known as: ADVIL   losartan 100 MG tablet Commonly known as: COZAAR   mometasone-formoterol 200-5 MCG/ACT Aero Commonly known as: DULERA   PARoxetine 20 MG tablet Commonly known as: PAXIL   Tylenol 8 Hour Arthritis Pain 650 MG CR tablet Generic drug: acetaminophen Replaced by: acetaminophen 325 MG tablet       TAKE these medications    acetaminophen 325 MG tablet Commonly known as: TYLENOL Take 2 tablets (650 mg total) by mouth every 6 (six) hours as needed for mild pain (or Fever >/= 101). Replaces: Tylenol 8 Hour Arthritis Pain 650 MG CR tablet   albuterol 108 (  90 Base) MCG/ACT inhaler Commonly known as: VENTOLIN HFA Inhale 2 puffs into the lungs every 6 (six) hours as needed for wheezing or shortness of breath.   amLODipine 2.5 MG tablet Commonly known as: NORVASC Take 1 tablet (2.5 mg total) by mouth  2 (two) times daily. What changed:  how much to take when to take this   apixaban 5 MG Tabs tablet Commonly known as: ELIQUIS Take 1 tablet (5 mg total) by mouth 2 (two) times daily.   Biofreeze 4 % Gel Generic drug: Menthol (Topical Analgesic) Apply 1 application  topically See admin instructions. Apply to affected area of the groin daily   buPROPion ER 100 MG 12 hr tablet Commonly known as: WELLBUTRIN SR Take 100 mg by mouth daily.   busPIRone 10 MG tablet Commonly known as: BUSPAR Take 10 mg by mouth 2 (two) times daily.   busPIRone 5 MG tablet Commonly known as: BUSPAR Take 5 mg by mouth 2 (two) times daily as needed (for anxiety).   dapagliflozin propanediol 5 MG Tabs tablet Commonly known as: FARXIGA Take 1 tablet (5 mg total) by mouth daily. Start taking on: April 04, 2022   hydrochlorothiazide 25 MG tablet Commonly known as: HYDRODIURIL Held until outpatient followup due to intermittent low blood pressure.   hydrOXYzine 10 MG tablet Commonly known as: ATARAX Take 10 mg by mouth daily.   levothyroxine 175 MCG tablet Commonly known as: SYNTHROID Take 175 mcg by mouth daily before breakfast.   MiraLax 17 GM/SCOOP powder Generic drug: polyethylene glycol powder Take 17 g by mouth daily.   nicotine 14 mg/24hr patch Commonly known as: NICODERM CQ - dosed in mg/24 hours Place 1 patch (14 mg total) onto the skin daily.   simvastatin 40 MG tablet Commonly known as: ZOCOR Take 40 mg by mouth daily.   tiZANidine 2 MG tablet Commonly known as: ZANAFLEX Take 2 mg by mouth daily as needed for muscle spasms.   topiramate 25 MG tablet Commonly known as: TOPAMAX Take 50 mg by mouth 2 (two) times daily.   Tradjenta 5 MG Tabs tablet Generic drug: linagliptin Take 2.5 mg by mouth daily.   Trelegy Ellipta 100-62.5-25 MCG/ACT Aepb Generic drug: Fluticasone-Umeclidin-Vilant Inhale 1 puff into the lungs daily.        DISPOSITION AND FOLLOW-UP:  Glenda  L Boone was discharged from [facility] in Stable condition. At the hospital follow up visit please address:  Follow-up Recommendations: Consults: None Labs:  lipid panel, T4, and TSH Studies: None Medications: Recommend performing medication reconciliation on patient following discharge.   Follow-up Appointments:   HOSPITAL COURSE:  Patient Summary: Glenda Boone is a 71 year old female with a history of COPD who presented with increasing dyspnea, orthopnea, weight gain, and lower extremity edema that had worsened over the past 2 weeks. On arrival she required 4-5L of supplemental oxygen which was elevated as she is typically not on oxygen at baseline.   COPD with Acute Exacerbation CXR demonstrated no active disease without effusions or edema. Bilateral lower extremity ultrasound was also obtained and demonstrated no evidence of DVT. Patient was placed on supplemental oxygen and a 5 day course of oral prednisone '40mg'$  QD along with Duoneb (q4H), Dulera (2 puffs BID), and Incruse Ellipta (1 puff daily). Oxygen supplementation was slowly tapered as tolerated to a baseline of 1.5 - 2L with an oxygenation goal of 88%-92%. Patient experienced mild desaturation to 88% with activity as noted per nursing staff, but quickly came back  to 93% with rest.    Acute on Chronic Heart Failure with Preserved Ejection Fraction Echocardiogram demonstrated an EF >75% with grade 1 diastolic dysfunction. Clinical findings were concerning for HFpEF exacerbation. Patient was diuresed initially with IV furosemide '40mg'$  daily then switched to oral lasix. Throughout the course of her stay, she had a net loss of 13kg and 12.4L. Lasix was discontinued as lower extremity edema, dyspnea, and orthopnea resolved as patient no longer appeared to be fluid overloaded.    Obstructive Sleep Apnea Patient was tolerating room air to 1.5-2L [place] well while awake, but desaturated to 75% while asleep according to  nursing staff. She required 3L while asleep to maintain adequate oxygen saturation. This inability to maintain oxygenation may contribute to patient's nighttime anxiety. She did not want to use CPAP while admitted and does not use this at her facility. She expressed understanding of the importance to increase her oxygen use while she was resting.  Acute Uncomplicated Urinary Tract Infection Patient complained of dysuria during stay. Urinalysis demonstrated UTI. Patient was provided with TMP/SMX 1 tab BID for 3 days with resolution of symptoms.   Chronic Kidney Disease 3b On arrival, patient's creatinine was elevated. Patient was provided with lasix as described above resulting in a decrease in her creatinine level. During the course of her stay, her creatinine had an expected increase following initiation of dapagliflozin, but the medication was continued due to the long-term benefits of this medication for diabetes, heart failure, and chronic kidney disease.   Atrial Fibrillation Patient experienced atrial fibrillation intermittently throughout her stay. Metoprolol succinate was provided, but had to be discontinued due to bradycardia. Rate remained controlled without medication throughout the remainder of her stay. She remains on anticoagulation with apixaban.   Essential Hypertension Patient experienced intermittent hypertension throughout admission.   Type 2 Diabetes Mellitus HgbA1c of 7.3% on 11/3. Patient was provided with sliding scale insulin and regular glucose monitoring. She was also started on dapagliflozin during her stay as stated above.   Anemia Hemoglobin and hematocrit remained stable throughout duration of stay. Patient experienced no overt signs or symptoms of anemia or bleeding. Iron studies not consistent with deficiency. MCV 101.8 on admission, but <100 throughout remainder of stay. We suspect this is most likely 2/2 CKD.   Hypothyroidism Pharmacy could not reconcile  medication list with facility and patient records. Performing TSH and free T4 today and will report results to nursing facility to determine if patient needs to continue levothyroxine 121mg QD.   Hyperlipidemia Pharmacy could not reconcile medication list with facility and patient records. Performing lipid panel today and will report results to nursing facility to determine if patient needs to continue simvastatin '40mg'$  QD.   Depression Anxiety Patient continued to express some feelings of anxiety that tended to worsen in the evening. These feelings were often associated with shortness of breath and resolved with medication and supplemental oxygen.     DISCHARGE INSTRUCTIONS:    SUBJECTIVE:  Patient was resting comfortably in a reclined position in bed. She stated she slept well during the night and did not experience any continued anxiety. Room air trial caused patient to desaturate to 88% while talking with her. Patient was left on 1.5L [place]. Swelling, wheezing, orthopnea, and dyspnea and had dramatically improved.   Discharge Vitals:   BP 135/60   Pulse (!) 52   Temp 98.9 F (37.2 C) (Oral)   Resp 17   Ht '5\' 4"'$  (1.626 m)   Wt (!) 160.1 kg  SpO2 94%   BMI 60.59 kg/m   OBJECTIVE:  Physical Exam Constitutional:      General: She is not in acute distress.    Appearance: She is obese.  Cardiovascular:     Rate and Rhythm: Normal rate and regular rhythm.  Pulmonary:     Breath sounds: Examination of the left-upper field reveals rhonchi. Decreased breath sounds and rhonchi (improved with cough) present. No wheezing.  Musculoskeletal:     Right lower leg: Tenderness (chronic due to neuropathy) present. No edema.     Left lower leg: Tenderness (chronic due to neuropathy) present. No edema.  Skin:    General: Skin is warm and dry.     Comments: Chronic changes of stasis dermatitis noted along bilateral lower extremities  Neurological:     Mental Status: She is alert and oriented  to person, place, and time.  Psychiatric:        Mood and Affect: Mood is not anxious.      Pertinent Labs, Studies, and Procedures:     Latest Ref Rng & Units 04/03/2022    5:52 AM 04/02/2022    6:45 AM 04/01/2022    3:00 AM  CBC  WBC 4.0 - 10.5 K/uL 10.2  11.9  12.4   Hemoglobin 12.0 - 15.0 g/dL 10.1  10.2  9.9   Hematocrit 36.0 - 46.0 % 31.8  32.5  31.5   Platelets 150 - 400 K/uL 233  229  238        Latest Ref Rng & Units 04/03/2022    5:52 AM 04/02/2022    6:45 AM 04/01/2022    3:00 AM  CMP  Glucose 70 - 99 mg/dL 117  108  200   BUN 8 - 23 mg/dL 47  40  39   Creatinine 0.44 - 1.00 mg/dL 1.70  1.71  1.47   Sodium 135 - 145 mmol/L 136  139  140   Potassium 3.5 - 5.1 mmol/L 4.1  4.2  4.1   Chloride 98 - 111 mmol/L 97  101  102   CO2 22 - 32 mmol/L '29  29  29   '$ Calcium 8.9 - 10.3 mg/dL 8.6  8.8  8.8     VAS [place] LOWER EXTREMITY VENOUS (DVT) (7a-7p)  Result Date: 03/29/2022  Lower Venous DVT Study Patient Name:  Glenda Boone  Date of Exam:   03/28/2022 Medical Rec #: [id number]        Accession #:    [id number] Date of Birth: 11-Jan-1951        Patient Gender: F Patient Age:   47 years Exam Location:  [facility] Procedure:      VAS [place] LOWER EXTREMITY VENOUS (DVT) Referring Phys: Collier Salina MESSICK --------------------------------------------------------------------------------  Indications: Edema.  Limitations: Body habitus, poor ultrasound/tissue interface and pain tolerance. Comparison Study: no prior Performing Technologist: Archie Patten RVS  Examination Guidelines: A complete evaluation includes B-mode imaging, spectral Doppler, color Doppler, and power Doppler as needed of all accessible portions of each vessel. Bilateral testing is considered an integral part of a complete examination. Limited examinations for reoccurring indications may be performed as noted. The reflux portion of the exam is performed with the patient in reverse Trendelenburg.   +--------+---------------+---------+-----------+----------+--------------------+ RIGHT   CompressibilityPhasicitySpontaneityPropertiesThrombus Aging       +--------+---------------+---------+-----------+----------+--------------------+ CFV     Full           Yes      Yes                                       +--------+---------------+---------+-----------+----------+--------------------+  SFJ     Full                                                              +--------+---------------+---------+-----------+----------+--------------------+ FV Prox Full                                                              +--------+---------------+---------+-----------+----------+--------------------+ FV Mid                 Yes      Yes                  patent by color                                                           doppler              +--------+---------------+---------+-----------+----------+--------------------+ FV                     Yes      Yes                  patent by color      Distal                                               doppler              +--------+---------------+---------+-----------+----------+--------------------+ PFV     Full                                                              +--------+---------------+---------+-----------+----------+--------------------+ POP                    Yes      Yes                  patent by color                                                           doppler              +--------+---------------+---------+-----------+----------+--------------------+ PTV                    Yes      Yes                  patent by  color                                                           doppler              +--------+---------------+---------+-----------+----------+--------------------+ PERO                                                 Not well visualized   +--------+---------------+---------+-----------+----------+--------------------+   +--------+---------------+---------+-----------+----------+--------------------+ LEFT    CompressibilityPhasicitySpontaneityPropertiesThrombus Aging       +--------+---------------+---------+-----------+----------+--------------------+ CFV     Full           Yes      Yes                                       +--------+---------------+---------+-----------+----------+--------------------+ SFJ     Full                                                              +--------+---------------+---------+-----------+----------+--------------------+ FV Prox Full                                                              +--------+---------------+---------+-----------+----------+--------------------+ FV Mid                 Yes      Yes                  patent by color                                                           doppler              +--------+---------------+---------+-----------+----------+--------------------+ FV                     Yes      Yes                  patent by color      Distal                                               doppler              +--------+---------------+---------+-----------+----------+--------------------+ PFV     Full                                                              +--------+---------------+---------+-----------+----------+--------------------+  POP                    Yes      Yes                  patent by color                                                           doppler              +--------+---------------+---------+-----------+----------+--------------------+ PTV                    Yes      Yes                  patent by color                                                           doppler              +--------+---------------+---------+-----------+----------+--------------------+  PERO                                                 Not well visualized  +--------+---------------+---------+-----------+----------+--------------------+     Summary: RIGHT: - There is no evidence of deep vein thrombosis in the lower extremity. However, portions of this examination were limited- see technologist comments above.  LEFT: - There is no evidence of deep vein thrombosis in the lower extremity. However, portions of this examination were limited- see technologist comments above.  *See table(s) above for measurements and observations. Electronically signed by Orlie Pollen on 03/29/2022 at 1:26:48 PM.    Final    ECHOCARDIOGRAM COMPLETE  Result Date: 03/28/2022    ECHOCARDIOGRAM REPORT   Patient Name:   Glenda Boone Date of Exam: 03/28/2022 Medical Rec #:  [id number]       Height:       63.0 in Accession #:    [id number]      Weight:       285.0 lb Date of Birth:  1950-06-23       BSA:          2.248 m Patient Age:    51 years        BP:           164/81 mmHg Patient Gender: F               HR:           75 bpm. Exam Location:  Inpatient Procedure: 2D Echo Indications:    Dyspnea  History:        Patient has prior history of Echocardiogram examinations, most                 recent 02/10/2020. COPD, Arrythmias:Atrial Fibrillation; Risk                 Factors:Diabetes and Hypertension.  Sonographer:    Harvie Junior Referring Phys: [phone]  [facility]  Sonographer Comments: Technically difficult study due to poor echo windows and patient is obese. Image acquisition challenging due to COPD, Image acquisition challenging due to patient body habitus and supine and moving throughout exam. IMPRESSIONS  1. Left ventricular ejection fraction, by estimation, is >75%. The left ventricle has hyperdynamic function. The left ventricle has no regional wall motion abnormalities. There is moderate concentric left ventricular hypertrophy. Left ventricular diastolic parameters are consistent with Grade I  diastolic dysfunction (impaired relaxation).  2. Right ventricular systolic function is normal. The right ventricular size is normal.  3. The mitral valve is normal in structure. No evidence of mitral valve regurgitation. No evidence of mitral stenosis.  4. The aortic valve has an indeterminant number of cusps. Aortic valve regurgitation is not visualized. No aortic stenosis is present. FINDINGS  Left Ventricle: Left ventricular ejection fraction, by estimation, is >75%. The left ventricle has hyperdynamic function. The left ventricle has no regional wall motion abnormalities. The left ventricular internal cavity size was normal in size. There is moderate concentric left ventricular hypertrophy. Left ventricular diastolic parameters are consistent with Grade I diastolic dysfunction (impaired relaxation). Right Ventricle: The right ventricular size is normal. Right ventricular systolic function is normal. Left Atrium: Left atrial size was normal in size. Right Atrium: Right atrial size was normal in size. Pericardium: There is no evidence of pericardial effusion. Mitral Valve: The mitral valve is normal in structure. Mild mitral annular calcification. No evidence of mitral valve regurgitation. No evidence of mitral valve stenosis. Tricuspid Valve: The tricuspid valve is normal in structure. Tricuspid valve regurgitation is trivial. No evidence of tricuspid stenosis. Aortic Valve: The aortic valve has an indeterminant number of cusps. Aortic valve regurgitation is not visualized. No aortic stenosis is present. Pulmonic Valve: The pulmonic valve was normal in structure. Pulmonic valve regurgitation is not visualized. No evidence of pulmonic stenosis. Aorta: The aortic root is normal in size and structure. Venous: The inferior vena cava was not well visualized. IAS/Shunts: The interatrial septum was not well visualized.  LEFT VENTRICLE PLAX 2D LVIDd:         5.00 cm      Diastology LVIDs:         3.30 cm      LV e'  medial:    6.37 cm/s LV PW:         1.40 cm      LV E/e' medial:  23.4 LV IVS:        1.40 cm      LV e' lateral:   7.62 cm/s LVOT diam:     2.50 cm      LV E/e' lateral: 19.6 LV SV:         164 LV SV Index:   73 LVOT Area:     4.91 cm  LV Volumes (MOD) LV vol d, MOD A2C: 93.9 ml LV vol d, MOD A4C: 114.0 ml LV vol s, MOD A2C: 40.8 ml LV vol s, MOD A4C: 35.0 ml LV SV MOD A2C:     53.1 ml LV SV MOD A4C:     114.0 ml LV SV MOD BP:      66.5 ml RIGHT VENTRICLE RV S prime:     23.20 cm/s LEFT ATRIUM           Index LA diam:      3.60 cm 1.60 cm/m LA Vol (A4C): 44.4 ml 19.75 ml/m  AORTIC VALVE  PULMONIC VALVE AV Area (Vmax):    4.59 cm      PV Vmax:       1.49 m/s AV Area (Vmean):   4.27 cm      PV Peak grad:  8.9 mmHg AV Area (VTI):     4.43 cm AV Vmax:           153.00 cm/s AV Vmean:          116.000 cm/s AV VTI:            0.371 m AV Peak Grad:      9.4 mmHg AV Mean Grad:      6.0 mmHg LVOT Vmax:         143.00 cm/s LVOT Vmean:        101.000 cm/s LVOT VTI:          0.335 m LVOT/AV VTI ratio: 0.90  AORTA Ao Root diam: 3.70 cm Ao Asc diam:  3.50 cm MITRAL VALVE                TRICUSPID VALVE MV Area (PHT): 3.08 cm     TR Peak grad:   20.6 mmHg MV Area VTI:   3.34 cm     TR Vmax:        227.00 cm/s MV Peak grad:  11.0 mmHg MV Mean grad:  5.5 mmHg     SHUNTS MV Vmax:       1.66 m/s     Systemic VTI:  0.34 m MV Vmean:      107.5 cm/s   Systemic Diam: 2.50 cm MV Decel Time: 246 msec MV E velocity: 149.00 cm/s MV A velocity: 159.00 cm/s MV E/A ratio:  0.94 Kirk Ruths MD Electronically signed by Kirk Ruths MD Signature Date/Time: 03/28/2022/3:24:14 PM    Final    DG Chest 2 View  Result Date: 03/28/2022 CLINICAL DATA:  Shortness of breath EXAM: CHEST - 2 VIEW COMPARISON:  01/28/2021 FINDINGS: Mild elevation of the right hemidiaphragm. Heart is borderline in size. No confluent airspace opacities, effusions or edema. No acute bony abnormality. Aortic atherosclerosis. IMPRESSION: Borderline  heart size. Stable chronic elevation of the right hemidiaphragm. No active disease. Electronically Signed   By: Rolm Baptise M.D.   On: 03/28/2022 02:33     Signed: Farrel Gordon, D.O.  Internal Medicine Resident, PGY-2 [facility]  Pager: [phone]

## 2022-04-03 NOTE — TOC Transition Note (Signed)
Transition of Care [facility]) - CM/SW Discharge Note   Patient Details  Name: Glenda Boone MRN: [id number] Date of Birth: 03/11/1951  Transition of Care [facility]) CM/SW Contact:  Milinda Antis, [place] Phone Number: 04/03/2022, 4:20 PM   Clinical Narrative:    Patient will DC to:  [facility] SN[facility] Anticipated DC date:  04/03/2022 Transport by: Corey Harold   Per MD patient ready for DC to SNF. RN to call report prior to discharge [phone] room 407B). RN, patient, patient's family, and facility notified of DC. Discharge Summary sent to facility. DC packet on chart. Ambulance transport will be requested for patient.   CSW will sign off for now as social work intervention is no longer needed. Please consult [place] again if new needs arise.    Final next level of care: Skilled Nursing Facility Barriers to Discharge: Barriers Resolved   Patient Goals and CMS Choice Patient states their goals for this hospitalization and ongoing recovery are:: SNF CMS Medicare.gov Compare Post Acute Care list provided to:: Patient Choice offered to / list presented to : Patient  Discharge Placement              Patient [facility] bed at:  [facility]) Patient to be transferred to facility by: [place] Name of family member notified: patient alert and oriented Patient and family notified of of transfer: 04/03/22  Discharge Plan and Services In-house Referral: Clinical Social Work                                   Social Determinants of Health ([place]) Interventions     Readmission Risk Interventions    02/02/2020    5:30 PM  Readmission Risk Prevention Plan  Transportation Screening Complete  PCP or Specialist Appt within 3-5 Days Complete  HRI or [place] Complete  Social Work Consult for [place] Planning/Counseling Complete  Palliative Care Screening Not Applicable  Medication Review Press photographer) Complete

## 2022-04-03 NOTE — Progress Notes (Signed)
Physical Therapy Treatment Patient Details Name: Glenda Boone MRN: [id number] DOB: Sep 19, 1950 Today's Date: 04/03/2022   History of Present Illness 71 year old person admitted with COPD exacerbation. She reports several upper respiratory viral infections over the last 2 weeks, says she had a diagnosis of acute bronchitis. Patient lives in an assisted living facility for last 3 years.living with severe obesity, atrial fibrillation, CKD stage IIIa    PT Comments    Patient progressing towards physical therapy goals. Patient able to come to EOB with min guard and use of bed rails. Able to stand from EOB with minA and take steps towards recliner on R side. Encouraged OOB for meals while admitted. Educated on pressure relief with lateral leans while in chair every hour, patient verbalized understanding. Continue to recommend SNF for ongoing Physical Therapy.       Recommendations for follow up therapy are one component of a multi-disciplinary discharge planning process, led by the attending physician.  Recommendations may be updated based on patient status, additional functional criteria and insurance authorization.  Follow Up Recommendations  Skilled nursing-short term rehab (<3 hours/day) Can patient physically be transported by private vehicle: No   Assistance Recommended at Discharge Intermittent Supervision/Assistance  Patient can return home with the following     Equipment Recommendations  None recommended by PT    Recommendations for Other Services       Precautions / Restrictions Precautions Precautions: Fall Restrictions Weight Bearing Restrictions: No     Mobility  Bed Mobility Overal bed mobility: Needs Assistance Bed Mobility: Supine to Sit     Supine to sit: Min guard     General bed mobility comments: able to utilize bedrails to assist self to EOB without assist. Bed max inflated for ease    Transfers Overall transfer level: Needs assistance Equipment  used: Rolling Keiana Tavella (2 wheels) Transfers: Sit to/from Stand, Bed to chair/wheelchair/BSC Sit to Stand: Min assist   Step pivot transfers: Min assist, +2 safety/equipment       General transfer comment: assist to boost into standing and able to take steps towards recliner on R side with minA for balance and RW management    Ambulation/Gait                   Stairs             Wheelchair Mobility    Modified Rankin (Stroke Patients Only)       Balance Overall balance assessment: Needs assistance Sitting-balance support: No upper extremity supported, Feet supported Sitting balance-Leahy Scale: Fair     Standing balance support: Bilateral upper extremity supported, During functional activity Standing balance-Leahy Scale: Poor                              Cognition Arousal/Alertness: Awake/alert Behavior During Therapy: WFL for tasks assessed/performed Overall Cognitive Status: No family/caregiver present to determine baseline cognitive functioning                                          Exercises      General Comments        Pertinent Vitals/Pain Pain Assessment Pain Assessment: Faces Faces Pain Scale: Hurts a little bit Pain Location: generalized Pain Descriptors / Indicators: Discomfort Pain Intervention(s): Monitored during session    Home Living  Prior Function            PT Goals (current goals can now be found in the care plan section) Acute Rehab PT Goals Patient Stated Goal: Be able to get OOB PT Goal Formulation: With patient Time For Goal Achievement: 04/12/22 Potential to Achieve Goals: Good Progress towards PT goals: Progressing toward goals    Frequency    Min 2X/week      PT Plan Current plan remains appropriate    Co-evaluation              AM-PAC PT "6 Clicks" Mobility   Outcome Measure  Help needed turning from your back to your side  while in a flat bed without using bedrails?: A Little Help needed moving from lying on your back to sitting on the side of a flat bed without using bedrails?: A Little Help needed moving to and from a bed to a chair (including a wheelchair)?: A Little Help needed standing up from a chair using your arms (e.g., wheelchair or bedside chair)?: A Lot Help needed to walk in hospital room?: Total Help needed climbing 3-5 steps with a railing? : Total 6 Click Score: 13    End of Session Equipment Utilized During Treatment: Oxygen Activity Tolerance: Patient tolerated treatment well Patient left: in chair;with call bell/phone within reach Nurse Communication: Mobility status PT Visit Diagnosis: Other abnormalities of gait and mobility (R26.89);Muscle weakness (generalized) (M62.81)     Time: 1657-9038 PT Time Calculation (min) (ACUTE ONLY): 17 min  Charges:  $Therapeutic Activity: 8-22 mins                     Danzel Marszalek A. Gilford Rile PT, DPT Acute Rehabilitation Services Office [phone]    Linna Hoff 04/03/2022, 5:16 PM

## 2022-04-03 NOTE — TOC Progression Note (Signed)
Transition of Care [facility]) - Initial/Assessment Note    Patient Details  Name: Glenda Boone MRN: [id number] Date of Birth: [birth year]  Transition of Care [facility]) CM/SW Contact:    Milinda Antis, LCSWA Phone Number: 04/03/2022, 10:25 AM  Clinical Narrative:                 LCSW informed by Glenda Boone that insurance Glenda Boone has been received.  Attending notified that patient can d/c to facility if still medically ready.  Expected Discharge Plan: Skilled Nursing Facility Barriers to Discharge: Continued Medical Work up   Patient Goals and CMS Choice Patient states their goals for this hospitalization and ongoing recovery are:: SNF CMS Medicare.gov Compare Post Acute Care list provided to:: Patient Choice offered to / list presented to : Patient  Expected Discharge Plan and Services Expected Discharge Plan: [place] In-house Referral: Clinical Social Work     Living arrangements for the past 2 months: Glenda Boone                                      Prior Living Arrangements/Services Living arrangements for the past 2 months: Glenda Boone Lives with:: Facility Resident (From [facility] long term) Patient language and need for interpreter reviewed:: Yes Do you feel safe going back to the place where you live?: Yes      Need for Family Participation in Patient Care: Yes (Comment) Care giver support system in place?: Yes (comment)   Criminal Activity/Legal Involvement Pertinent to Current Situation/Hospitalization: No - Comment as needed  Activities of Daily Living Home Assistive Devices/Equipment: None ADL Screening (condition at time of admission) Patient's cognitive ability adequate to safely complete daily activities?: Yes Is the patient deaf or have difficulty hearing?: No Does the patient have difficulty seeing, even when wearing glasses/contacts?: No Does the patient have difficulty concentrating, remembering, or making  decisions?: No Patient able to express need for assistance with ADLs?: Yes Does the patient have difficulty dressing or bathing?: Yes Independently performs ADLs?: No Communication: Independent Dressing (OT): Needs assistance Grooming: Needs assistance Feeding: Independent Bathing: Needs assistance Toileting: Dependent In/Out Bed: Dependent Walks in Home: Needs assistance Does the patient have difficulty walking or climbing stairs?: Yes Weakness of Legs: Both Weakness of Arms/Hands: None  Permission Sought/Granted Permission sought to share information with : Case Manager, Family Supports, Customer service manager Permission granted to share information with : Yes, Verbal Permission Granted  Share Information with NAME: Glenda Boone  Permission granted to share info w AGENCY: SNF  Permission granted to share info w Relationship: sister  Permission granted to share info w Contact Information: Glenda Boone [phone]  Emotional Assessment   Attitude/Demeanor/Rapport: Gracious Affect (typically observed): Calm Orientation: : Oriented to Self, Oriented to Place, Oriented to  Time, Oriented to Situation Alcohol / Substance Use: Not Applicable Psych Involvement: No (comment)  Admission diagnosis:  COPD with acute exacerbation ([place]) [J44.1] Dyspnea, unspecified type [R06.00] Patient Active Problem List   Diagnosis Date Noted   COPD with acute exacerbation ([place]) 03/28/2022   Acute on chronic heart failure with preserved ejection fraction (HFpEF) ([place]) 03/28/2022   Acquired thrombophilia ([place]) 02/12/2020   Current use of long term anticoagulation 02/12/2020   Current every day smoker 02/12/2020   Atrial fibrillation with rapid ventricular response ([place]) 02/11/2020   Hyperthyroidism 02/11/2020   Pneumonia due to COVID-19 virus 01/31/2020   COPD (chronic obstructive pulmonary disease) (  [place]) 01/31/2020   Essential hypertension 01/31/2020   Hypoxia 01/31/2020   Obesity, Class III,  BMI 40-49.9 (morbid obesity) ([place]) 01/31/2020   Acute respiratory failure with hypoxia (HCC)    Type 2 diabetes mellitus with hyperlipidemia ([place])    Hypothyroidism    Acute metabolic encephalopathy 77/03/6578   TIA (transient ischemic attack) 09/04/2019   MDD (major depressive disorder), recurrent, in full remission ([place]) 04/25/2019   Aphagia 03/09/2019   Shingles outbreak 06/21/2015   Chronic bronchitis ([place]) 06/21/2015   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[place]) 11/28/2014   H/O diabetes mellitus 11/28/2014   Anxiety, generalized 11/28/2014   H/O hypercholesterolemia 11/28/2014   H/O: HTN (hypertension) 11/28/2014   H/O: hypothyroidism 11/28/2014   Depression, major, recurrent, moderate ([place]) 11/28/2014   H/O: obesity 11/28/2014   H/O disease 11/28/2014   Neuropathy 11/28/2014   PCP:  [facility] [place]:   Glenda Boone, [place] - [address] [place] [address] Phone: [phone] Fax: [phone]     Social Determinants of Health ([place]) Interventions    Readmission Risk Interventions    02/02/2020    5:30 PM  Readmission Risk Prevention Plan  Transportation Screening Complete  PCP or Specialist Appt within 3-5 Days Complete  HRI or [place] Complete  Social Work Consult for [place] Planning/Counseling Complete  Palliative Care Screening Not Applicable  Medication Review Press photographer) Complete

## 2022-05-23 ENCOUNTER — Emergency Department (HOSPITAL_COMMUNITY): Payer: Medicare HMO

## 2022-05-23 ENCOUNTER — Other Ambulatory Visit: Payer: Self-pay

## 2022-05-23 ENCOUNTER — Inpatient Hospital Stay (HOSPITAL_COMMUNITY)
Admission: EM | Admit: 2022-05-23 | Discharge: 2022-05-30 | DRG: 189 | Disposition: A | Discharge status: Skilled Nursing Facility | Payer: Medicare HMO | Attending: Internal Medicine | Admitting: Internal Medicine

## 2022-05-23 ENCOUNTER — Encounter (HOSPITAL_COMMUNITY): Payer: Self-pay

## 2022-05-23 DIAGNOSIS — J1000 Influenza due to other identified influenza virus with unspecified type of pneumonia: Secondary | ICD-10-CM | POA: Diagnosis present

## 2022-05-23 DIAGNOSIS — J101 Influenza due to other identified influenza virus with other respiratory manifestations: Principal | ICD-10-CM

## 2022-05-23 DIAGNOSIS — F17210 Nicotine dependence, cigarettes, uncomplicated: Secondary | ICD-10-CM | POA: Diagnosis present

## 2022-05-23 DIAGNOSIS — F419 Anxiety disorder, unspecified: Secondary | ICD-10-CM | POA: Diagnosis present

## 2022-05-23 DIAGNOSIS — K5900 Constipation, unspecified: Secondary | ICD-10-CM | POA: Diagnosis present

## 2022-05-23 DIAGNOSIS — E785 Hyperlipidemia, unspecified: Secondary | ICD-10-CM | POA: Diagnosis present

## 2022-05-23 DIAGNOSIS — Z1152 Encounter for screening for COVID-19: Secondary | ICD-10-CM

## 2022-05-23 DIAGNOSIS — F32A Depression, unspecified: Secondary | ICD-10-CM | POA: Diagnosis present

## 2022-05-23 DIAGNOSIS — S82892A Other fracture of left lower leg, initial encounter for closed fracture: Secondary | ICD-10-CM

## 2022-05-23 DIAGNOSIS — I5033 Acute on chronic diastolic (congestive) heart failure: Secondary | ICD-10-CM | POA: Diagnosis present

## 2022-05-23 DIAGNOSIS — J440 Chronic obstructive pulmonary disease with acute lower respiratory infection: Secondary | ICD-10-CM | POA: Diagnosis present

## 2022-05-23 DIAGNOSIS — N1832 Chronic kidney disease, stage 3b: Secondary | ICD-10-CM | POA: Diagnosis present

## 2022-05-23 DIAGNOSIS — J9621 Acute and chronic respiratory failure with hypoxia: Secondary | ICD-10-CM

## 2022-05-23 DIAGNOSIS — Z8543 Personal history of malignant neoplasm of ovary: Secondary | ICD-10-CM

## 2022-05-23 DIAGNOSIS — W1830XA Fall on same level, unspecified, initial encounter: Secondary | ICD-10-CM | POA: Diagnosis present

## 2022-05-23 DIAGNOSIS — J9601 Acute respiratory failure with hypoxia: Secondary | ICD-10-CM

## 2022-05-23 DIAGNOSIS — E039 Hypothyroidism, unspecified: Secondary | ICD-10-CM | POA: Diagnosis present

## 2022-05-23 DIAGNOSIS — Z800 Family history of malignant neoplasm of digestive organs: Secondary | ICD-10-CM

## 2022-05-23 DIAGNOSIS — R0602 Shortness of breath: Secondary | ICD-10-CM | POA: Diagnosis not present

## 2022-05-23 DIAGNOSIS — Z6841 Body Mass Index (BMI) 40.0 and over, adult: Secondary | ICD-10-CM

## 2022-05-23 DIAGNOSIS — Z79890 Hormone replacement therapy (postmenopausal): Secondary | ICD-10-CM

## 2022-05-23 DIAGNOSIS — Z79899 Other long term (current) drug therapy: Secondary | ICD-10-CM

## 2022-05-23 DIAGNOSIS — S82832A Other fracture of upper and lower end of left fibula, initial encounter for closed fracture: Secondary | ICD-10-CM | POA: Diagnosis present

## 2022-05-23 DIAGNOSIS — I482 Chronic atrial fibrillation, unspecified: Secondary | ICD-10-CM

## 2022-05-23 DIAGNOSIS — I4891 Unspecified atrial fibrillation: Secondary | ICD-10-CM | POA: Diagnosis present

## 2022-05-23 DIAGNOSIS — J441 Chronic obstructive pulmonary disease with (acute) exacerbation: Secondary | ICD-10-CM | POA: Diagnosis present

## 2022-05-23 DIAGNOSIS — I130 Hypertensive heart and chronic kidney disease with heart failure and stage 1 through stage 4 chronic kidney disease, or unspecified chronic kidney disease: Secondary | ICD-10-CM | POA: Diagnosis present

## 2022-05-23 DIAGNOSIS — G4733 Obstructive sleep apnea (adult) (pediatric): Secondary | ICD-10-CM | POA: Diagnosis present

## 2022-05-23 DIAGNOSIS — Z9981 Dependence on supplemental oxygen: Secondary | ICD-10-CM

## 2022-05-23 DIAGNOSIS — Z90710 Acquired absence of both cervix and uterus: Secondary | ICD-10-CM

## 2022-05-23 DIAGNOSIS — Z66 Do not resuscitate: Secondary | ICD-10-CM | POA: Diagnosis present

## 2022-05-23 DIAGNOSIS — Z7901 Long term (current) use of anticoagulants: Secondary | ICD-10-CM

## 2022-05-23 DIAGNOSIS — E1122 Type 2 diabetes mellitus with diabetic chronic kidney disease: Secondary | ICD-10-CM | POA: Diagnosis present

## 2022-05-23 DIAGNOSIS — I878 Other specified disorders of veins: Secondary | ICD-10-CM | POA: Diagnosis present

## 2022-05-23 DIAGNOSIS — Z8249 Family history of ischemic heart disease and other diseases of the circulatory system: Secondary | ICD-10-CM

## 2022-05-23 HISTORY — DX: Acute and chronic respiratory failure with hypoxia: J96.21

## 2022-05-23 LAB — COMPREHENSIVE METABOLIC PANEL
ALT: 20 U/L (ref 0–44)
AST: 35 U/L (ref 15–41)
Albumin: 3.3 g/dL — ABNORMAL LOW (ref 3.5–5.0)
Alkaline Phosphatase: 52 U/L (ref 38–126)
Anion gap: 12 (ref 5–15)
BUN: 20 mg/dL (ref 8–23)
CO2: 23 mmol/L (ref 22–32)
Calcium: 8.2 mg/dL — ABNORMAL LOW (ref 8.9–10.3)
Chloride: 102 mmol/L (ref 98–111)
Creatinine, Ser: 1.3 mg/dL — ABNORMAL HIGH (ref 0.44–1.00)
GFR, Estimated: 44 mL/min — ABNORMAL LOW (ref >60)
Glucose, Bld: 125 mg/dL — ABNORMAL HIGH (ref 70–99)
Potassium: 4.6 mmol/L (ref 3.5–5.1)
Sodium: 137 mmol/L (ref 135–145)
Total Bilirubin: 0.3 mg/dL (ref 0.3–1.2)
Total Protein: 6.8 g/dL (ref 6.5–8.1)

## 2022-05-23 LAB — CBC WITH DIFFERENTIAL/PLATELET
Abs Immature Granulocytes: 0.06 10*3/uL (ref 0.00–0.07)
Basophils Absolute: 0 10*3/uL (ref 0.0–0.1)
Basophils Relative: 0 %
Eosinophils Absolute: 0.1 10*3/uL (ref 0.0–0.5)
Eosinophils Relative: 1 %
HCT: 34.1 % — ABNORMAL LOW (ref 36.0–46.0)
Hemoglobin: 10.5 g/dL — ABNORMAL LOW (ref 12.0–15.0)
Immature Granulocytes: 1 %
Lymphocytes Relative: 15 %
Lymphs Abs: 1.1 10*3/uL (ref 0.7–4.0)
MCH: 30.6 pg (ref 26.0–34.0)
MCHC: 30.8 g/dL (ref 30.0–36.0)
MCV: 99.4 fL (ref 80.0–100.0)
Monocytes Absolute: 0.6 10*3/uL (ref 0.1–1.0)
Monocytes Relative: 8 %
Neutro Abs: 5.4 10*3/uL (ref 1.7–7.7)
Neutrophils Relative %: 75 %
Platelets: 228 10*3/uL (ref 150–400)
RBC: 3.43 MIL/uL — ABNORMAL LOW (ref 3.87–5.11)
RDW: 15.4 % (ref 11.5–15.5)
WBC: 7.3 10*3/uL (ref 4.0–10.5)
nRBC: 0 % (ref 0.0–0.2)

## 2022-05-23 LAB — RESP PANEL BY RT-PCR (RSV, FLU A&B, COVID)  RVPGX2
Influenza A by PCR: POSITIVE — AB
Influenza B by PCR: NEGATIVE
Resp Syncytial Virus by PCR: NEGATIVE
SARS Coronavirus 2 by RT PCR: NEGATIVE

## 2022-05-23 LAB — LACTIC ACID, PLASMA
Lactic Acid, Venous: 0.7 mmol/L (ref 0.5–1.9)
Lactic Acid, Venous: 0.7 mmol/L (ref 0.5–1.9)

## 2022-05-23 LAB — BRAIN NATRIURETIC PEPTIDE: B Natriuretic Peptide: 42.6 pg/mL (ref 0.0–100.0)

## 2022-05-23 LAB — TROPONIN I (HIGH SENSITIVITY)
Troponin I (High Sensitivity): 12 ng/L (ref <18)
Troponin I (High Sensitivity): 14 ng/L (ref <18)

## 2022-05-23 MED ORDER — METHYLPREDNISOLONE SODIUM SUCC 125 MG IJ SOLR
125.0000 mg | Freq: Once | INTRAMUSCULAR | Status: AC
  Administered 2022-05-23: 125 mg via INTRAVENOUS
  Filled 2022-05-23: qty 2

## 2022-05-23 MED ORDER — AZITHROMYCIN 250 MG PO TABS
500.0000 mg | ORAL_TABLET | Freq: Every day | ORAL | Status: AC
  Administered 2022-05-23 – 2022-05-25 (×3): 500 mg via ORAL
  Filled 2022-05-23: qty 1
  Filled 2022-05-23 (×2): qty 2
  Filled 2022-05-23: qty 1

## 2022-05-23 MED ORDER — MAGNESIUM SULFATE 2 GM/50ML IV SOLN
2.0000 g | Freq: Once | INTRAVENOUS | Status: AC
  Administered 2022-05-23: 2 g via INTRAVENOUS
  Filled 2022-05-23: qty 50

## 2022-05-23 MED ORDER — RIVAROXABAN 10 MG PO TABS
10.0000 mg | ORAL_TABLET | Freq: Every day | ORAL | Status: DC
  Administered 2022-05-23: 10 mg via ORAL
  Filled 2022-05-23: qty 1

## 2022-05-23 MED ORDER — BUPROPION HCL ER (SR) 100 MG PO TB12
100.0000 mg | ORAL_TABLET | Freq: Every day | ORAL | Status: DC
  Administered 2022-05-24 – 2022-05-30 (×7): 100 mg via ORAL
  Filled 2022-05-23 (×8): qty 1

## 2022-05-23 MED ORDER — LEVOTHYROXINE SODIUM 75 MCG PO TABS
175.0000 ug | ORAL_TABLET | Freq: Every day | ORAL | Status: DC
  Administered 2022-05-24 – 2022-05-26 (×3): 175 ug via ORAL
  Filled 2022-05-23 (×3): qty 1

## 2022-05-23 MED ORDER — OSELTAMIVIR PHOSPHATE 75 MG PO CAPS
75.0000 mg | ORAL_CAPSULE | Freq: Two times a day (BID) | ORAL | Status: DC
  Administered 2022-05-24 – 2022-05-26 (×5): 75 mg via ORAL
  Filled 2022-05-23 (×8): qty 1

## 2022-05-23 MED ORDER — IPRATROPIUM-ALBUTEROL 0.5-2.5 (3) MG/3ML IN SOLN
3.0000 mL | Freq: Four times a day (QID) | RESPIRATORY_TRACT | Status: DC
  Administered 2022-05-23 – 2022-05-24 (×3): 3 mL via RESPIRATORY_TRACT
  Filled 2022-05-23 (×3): qty 3

## 2022-05-23 MED ORDER — DAPAGLIFLOZIN PROPANEDIOL 5 MG PO TABS
5.0000 mg | ORAL_TABLET | Freq: Every day | ORAL | Status: DC
  Administered 2022-05-24 – 2022-05-30 (×7): 5 mg via ORAL
  Filled 2022-05-23 (×8): qty 1

## 2022-05-23 MED ORDER — LINAGLIPTIN 5 MG PO TABS: 2.5000 mg | ORAL_TABLET | Freq: Every day | ORAL | Status: DC

## 2022-05-23 MED ORDER — SIMVASTATIN 20 MG PO TABS
40.0000 mg | ORAL_TABLET | Freq: Every day | ORAL | Status: DC
  Administered 2022-05-24: 40 mg via ORAL
  Filled 2022-05-23: qty 2

## 2022-05-23 MED ORDER — PREDNISONE 20 MG PO TABS
50.0000 mg | ORAL_TABLET | Freq: Every day | ORAL | Status: AC
  Administered 2022-05-24 – 2022-05-27 (×4): 50 mg via ORAL
  Filled 2022-05-23 (×4): qty 1

## 2022-05-23 MED ORDER — APIXABAN 5 MG PO TABS
5.0000 mg | ORAL_TABLET | Freq: Two times a day (BID) | ORAL | Status: DC
  Administered 2022-05-24 – 2022-05-30 (×13): 5 mg via ORAL
  Filled 2022-05-23 (×13): qty 1

## 2022-05-23 NOTE — ED Triage Notes (Signed)
SOB started at breakfast today thats gotten worse.  78% RA cpap at 96% 5 albuterol given.  Alert and oriented at baseline in a rehab from chf and copd

## 2022-05-23 NOTE — Consult Note (Signed)
Reason for Consult:Left ankle fx Referring Physician: Gerald Stabs Tegeler Time called: [phone] Time at bedside: Glenda Boone is an 71 y.o. female.  HPI: Letishia suffered a fall last night and hurt her ankle. This morning she began to have respiratory difficulties and actually came to the ED for that reason. She c/o ankle pain while here and x-rays showed a distal fibula fx and orthopedic surgery was consulted. She lives at home and uses a cane to ambulate at baseline. History gleaned from chart as pt requiring BiPap for ventilatory support.  Past Medical History:  Diagnosis Date   Anxiety    Bronchitis    COPD (chronic obstructive pulmonary disease) ([place])    Depression    Diabetes mellitus, type II (HCC)    Dyspnea    GERD (gastroesophageal reflux disease)    Hyperlipidemia    Hypertension    Hypothyroidism    Neuropathy    Ovarian cancer ([place])    Shingles    Thyroid disease     Past Surgical History:  Procedure Laterality Date   ABDOMINAL HYSTERECTOMY     ANKLE SURGERY Left    CHOLECYSTECTOMY     OVARY SURGERY     SMALL INTESTINE SURGERY     TONSILLECTOMY      Family History  Problem Relation Age of Onset   Heart Problems Mother    Hypertension Mother    Colon cancer Father    Arthritis-Osteo Sister     Social History:  reports that she has been smoking cigarettes. She started smoking about 42 years ago. She has been smoking an average of 1 pack per day. She has never used smokeless tobacco. She reports that she does not drink alcohol and does not use drugs.  Allergies:  Allergies  Allergen Reactions   Codeine Nausea Only and Other (See Comments)    "Allergic," per verbal MAR   Shellfish-Derived Products Hives and Other (See Comments)    "Allergic," per verbal MAR   Gabapentin Other (See Comments)    Gives nightmares- "Allergic," per verbal MAR   Meperidine Hcl Anxiety and Other (See Comments)    "Allergic," per verbal MAR    Medications: I have reviewed  the patient's current medications.  Results for orders placed or performed during the hospital encounter of 05/23/22 (from the past 48 hour(s))  Resp panel by RT-PCR (RSV, Flu A&B, Covid) Anterior Nasal Swab     Status: Abnormal   Collection Time: 05/23/22 10:22 AM   Specimen: Anterior Nasal Swab  Result Value Ref Range   SARS Coronavirus 2 by RT PCR NEGATIVE NEGATIVE    Comment: (NOTE) SARS-CoV-2 target nucleic acids are NOT DETECTED.  The SARS-CoV-2 RNA is generally detectable in upper respiratory specimens during the acute phase of infection. The lowest concentration of SARS-CoV-2 viral copies this assay can detect is 138 copies/mL. A negative result does not preclude SARS-Cov-2 infection and should not be used as the sole basis for treatment or other patient management decisions. A negative result may occur with  improper specimen collection/handling, submission of specimen other than nasopharyngeal swab, presence of viral mutation(s) within the areas targeted by this assay, and inadequate number of viral copies(<138 copies/mL). A negative result must be combined with clinical observations, patient history, and epidemiological information. The expected result is Negative.  Fact Sheet for Patients:  EntrepreneurPulse.com.au  Fact Sheet for Healthcare Providers:  IncredibleEmployment.be  This test is no t yet approved or cleared by the [place] and  has been authorized for detection and/or diagnosis of SARS-CoV-2 by FDA under an Emergency Use Authorization (EUA). This EUA will remain  in effect (meaning this test can be used) for the duration of the COVID-19 declaration under Section 564(b)(1) of the Act, 21 U.S.C.section 360bbb-3(b)(1), unless the authorization is terminated  or revoked sooner.       Influenza A by PCR POSITIVE (A) NEGATIVE   Influenza B by PCR NEGATIVE NEGATIVE    Comment: (NOTE) The Xpert Xpress  SARS-CoV-2/FLU/RSV plus assay is intended as an aid in the diagnosis of influenza from Nasopharyngeal swab specimens and should not be used as a sole basis for treatment. Nasal washings and aspirates are unacceptable for Xpert Xpress SARS-CoV-2/FLU/RSV testing.  Fact Sheet for Patients: EntrepreneurPulse.com.au  Fact Sheet for Healthcare Providers: IncredibleEmployment.be  This test is not yet approved or cleared by the [place] FDA and has been authorized for detection and/or diagnosis of SARS-CoV-2 by FDA under an Emergency Use Authorization (EUA). This EUA will remain in effect (meaning this test can be used) for the duration of the COVID-19 declaration under Section 564(b)(1) of the Act, 21 U.S.C. section 360bbb-3(b)(1), unless the authorization is terminated or revoked.     Resp Syncytial Virus by PCR NEGATIVE NEGATIVE    Comment: (NOTE) Fact Sheet for Patients: EntrepreneurPulse.com.au  Fact Sheet for Healthcare Providers: IncredibleEmployment.be  This test is not yet approved or cleared by the [place] FDA and has been authorized for detection and/or diagnosis of SARS-CoV-2 by FDA under an Emergency Use Authorization (EUA). This EUA will remain in effect (meaning this test can be used) for the duration of the COVID-19 declaration under Section 564(b)(1) of the Act, 21 U.S.C. section 360bbb-3(b)(1), unless the authorization is terminated or revoked.  Performed at [facility], [place] [address], [place], [place] [address]   Lactic acid, plasma     Status: None   Collection Time: 05/23/22 10:41 AM  Result Value Ref Range   Lactic Acid, Venous 0.7 0.5 - 1.9 mmol/L    Comment: Performed at [facility] [address], [place], [place] [address]  CBC with Differential     Status: Abnormal   Collection Time: 05/23/22 10:44 AM  Result Value Ref Range   WBC 7.3 4.0 - 10.5 K/uL    RBC 3.43 (L) 3.87 - 5.11 MIL/uL   Hemoglobin 10.5 (L) 12.0 - 15.0 g/dL   HCT 34.1 (L) 36.0 - 46.0 %   MCV 99.4 80.0 - 100.0 fL   MCH 30.6 26.0 - 34.0 pg   MCHC 30.8 30.0 - 36.0 g/dL   RDW 15.4 11.5 - 15.5 %   Platelets 228 150 - 400 K/uL   nRBC 0.0 0.0 - 0.2 %   Neutrophils Relative % 75 %   Neutro Abs 5.4 1.7 - 7.7 K/uL   Lymphocytes Relative 15 %   Lymphs Abs 1.1 0.7 - 4.0 K/uL   Monocytes Relative 8 %   Monocytes Absolute 0.6 0.1 - 1.0 K/uL   Eosinophils Relative 1 %   Eosinophils Absolute 0.1 0.0 - 0.5 K/uL   Basophils Relative 0 %   Basophils Absolute 0.0 0.0 - 0.1 K/uL   Immature Granulocytes 1 %   Abs Immature Granulocytes 0.06 0.00 - 0.07 K/uL    Comment: Performed at [facility] [address] [place],  [address]  Brain natriuretic peptide     Status: None   Collection Time: 05/23/22 10:44 AM  Result Value  Ref Range   B Natriuretic Peptide 42.6 0.0 - 100.0 pg/mL    Comment: Performed at [facility] [address] [place], [place] [address]    DG Ankle 2 Views Left  Result Date: 05/23/2022 CLINICAL DATA:  Fall EXAM: LEFT ANKLE - 2 VIEW COMPARISON:  None available. FINDINGS: There is an acute oblique fracture of the distal fibular metaphysis. Alignment is maintained. The ankle mortise appears intact on the provided views. There is marked surrounding soft tissue swelling. There is mild inferior calcaneal spurring and Achilles enthesopathy. IMPRESSION: Acute oblique nondisplaced fracture of the distal fibula. Electronically Signed   By: Valetta Mole M.D.   On: 05/23/2022 11:44   DG Chest Portable 1 View  Result Date: 05/23/2022 CLINICAL DATA:  Shortness of breath, respiratory failure. EXAM: PORTABLE CHEST 1 VIEW COMPARISON:  03/28/2022 and CT chest 09/10/2010. FINDINGS: Trachea is midline. Heart size stable. Bilateral perihilar opacification with streaky densities in the lung bases. There may be fluid along the minor fissure. Lungs are low in volume.  Right hemidiaphragm is chronically elevated. IMPRESSION: Bilateral perihilar opacification may be due to vascular congestion. Difficult to exclude pneumonia. Consider CT chest with contrast in further evaluation, as clinically indicated. Electronically Signed   By: Lorin Picket M.D.   On: 05/23/2022 10:46    Review of Systems  Unable to perform ROS: Other   Blood pressure (!) 161/86, pulse 74, temperature 98.5 F (36.9 C), temperature source Oral, resp. rate (!) 22, height '5\' 4"'$  (1.626 m), weight (!) 160.1 kg, SpO2 98 %. Physical Exam Constitutional:      General: She is not in acute distress.    Appearance: She is well-developed. She is not diaphoretic.  HENT:     Head: Normocephalic and atraumatic.  Eyes:     General: No scleral icterus.       Right eye: No discharge.        Left eye: No discharge.     Conjunctiva/sclera: Conjunctivae normal.  Cardiovascular:     Rate and Rhythm: Normal rate and regular rhythm.  Pulmonary:     Effort: No respiratory distress.     Comments: On BiPap Musculoskeletal:     Cervical back: Normal range of motion.     Comments: LLE No traumatic wounds, ecchymosis, or rash  Mild TTP ankle  No knee effusion  Knee stable to varus/ valgus and anterior/posterior stress  Sens DPN, SPN, TN intact  Motor EHL 5/5  DP 0, PT 0, 2+ edema  Skin:    General: Skin is warm and dry.  Neurological:     Mental Status: She is alert.  Psychiatric:        Mood and Affect: Mood normal.        Behavior: Behavior normal.     Assessment/Plan: Left ankle fx -- Plan WBAT in CAM boot. F/u with Dr. Stann Mainland in 2 weeks.    Lisette Abu, PA-C Orthopedic Surgery [phone] 05/23/2022, 1:10 PM

## 2022-05-23 NOTE — H&P (Signed)
Date: 05/23/2022               Patient Name:  Glenda Boone MRN: [id number]  DOB: 01-01-1951 Age / Sex: 71 y.o., female   PCP: [facility] Service: Internal Medicine Teaching Service         Attending Physician: Dr. Lottie Mussel, MD      First Contact: Dr. Johny Blamer, DO Pager [phone]    Second Contact: Dr. Christiana Fuchs, DO Pager [phone]         After Hours (After 5p/  First Contact Pager: [phone]  weekends / holidays): Second Contact Pager: [phone]   SUBJECTIVE   Chief Complaint: Dyspnea  History of Present Illness:  Glenda Boone is a 71 year old female with past medical history of COPD, HFpEF, OSA, CKD stage IIIb, atrial fibrillation, HTN, T2DM, hypothyroidism, anxiety and depression who presented with 2 weeks of dry cough and 1 day of dyspnea complicated by fall with left lower extremity injury yesterday.  She states that she has had a new cough for the past couple weeks that has been dry and not associated with any fevers and no shortness of breath until this morning.  She also notes that she has been abnormally fatigued for the past few days but denies any new or worsening nausea, vomiting, diarrhea, or decreased oral intake.  She is on 2 L of oxygen at home and has been on this since the beginning of November of this year.  She had a fall yesterday and states it was due to her overall fatigue.  She injured her left ankle but otherwise did not have any other injuries.  She denies any prodrome or presyncope/syncope.  Denies any chest pain, acute worsening dyspnea, lightheadedness, or confusion before or after her fall.  She notes that her leg was tender but not enough to come to the hospital right away.  This morning, she was saturating in 70s on 2L [place] and EMS was called.  ED Course: Patient was seen in the ED as above for new onset dyspnea and was found to be flu a positive with bilateral perihilar opacification chest x-ray which  was attributed to either vascular congestion or pneumonia.  She was requiring BiPAP initially but was then weaned down to nasal cannula however she was still requiring over 4 L to maintain sats in the low 90s.  Meds:  Amlodipine 2.5 mg Wellbutrin 100 mg Farxiga 5 mg Levothyroxine 175 mcg Miralax  Spironolactone 12.5 mg  Tamiflu since 12/28 Trelegy ellipta 100-62.5-25 Simvastatin 40 mg Eliquis 5 mg BID Topiramate 50 mg BID  Past Medical History COPD HFpEF OSA CKD stage IIIb Atrial fibrillation Hypertension Type 2 diabetes Hypothyroidism Anxiety Depression  Past Surgical History:  Procedure Laterality Date   ABDOMINAL HYSTERECTOMY     ANKLE SURGERY Left    CHOLECYSTECTOMY     OVARY SURGERY     SMALL INTESTINE SURGERY     TONSILLECTOMY      Social:  Lives at [place]: Glenda Boone who lives in [place] Level of Function: Usually independent in ADLs PCP: Facility physician at [place]: Continues to smoke about half pack cigarettes a day which is down from 1.5 packs for likely 50 years.  Denies alcohol or drug use.  Family History:  Mother had hypertension Father had colon cancer  Allergies: Allergies as of 05/23/2022 - Review Complete 05/23/2022  Allergen Reaction Noted   Codeine Nausea Only and  Other (See Comments) 12/19/2014   Shellfish-derived products Hives and Other (See Comments) 01/30/2020   Gabapentin Other (See Comments) 12/31/2019   Meperidine hcl Anxiety and Other (See Comments) 07/23/2017    Review of Systems: A complete ROS was negative except as per HPI.   OBJECTIVE:   Physical Exam: Blood pressure (!) 145/129, pulse 73, temperature 98.3 F (36.8 C), temperature source Oral, resp. rate (!) 29, height '5\' 4"'$  (1.626 m), weight (!) 160.1 kg, SpO2 92 %.  Constitutional: Morbidly obese and chronically ill-appearing elderly female. In no acute distress. HENT: Normocephalic, atraumatic,  Eyes: Sclera non-icteric, EOM  intact Cardio:Regular rate and rhythm. No murmurs, rubs, or gallops. 2+ bilateral radial pulses.  Palpable right dorsalis pedis pulse, unable to evaluate left with cam boot on Pulm: Expiratory crackles heard in anterior lung fields.  Increased work of breathing. Abdomen: Soft, non-tender, positive bowel sounds. MSK: Cam boot over the left lower extremity, 1-2+ pitting edema in the lower extremities bilaterally to the knee Skin:Warm and dry.  Venous stasis dermatitis changes over bilateral lower extremities distally. Neuro:Alert and oriented x3. No focal deficit noted. Psych:Pleasant mood and affect.  Labs: CBC    Component Value Date/Time   WBC 7.3 05/23/2022 1044   RBC 3.43 (L) 05/23/2022 1044   HGB 10.5 (L) 05/23/2022 1044   HGB 13.8 02/26/2014 1605   HCT 34.1 (L) 05/23/2022 1044   HCT 42.8 02/26/2014 1605   PLT 228 05/23/2022 1044   PLT 269 02/26/2014 1605   MCV 99.4 05/23/2022 1044   MCV 98 02/26/2014 1605   MCH 30.6 05/23/2022 1044   MCHC 30.8 05/23/2022 1044   RDW 15.4 05/23/2022 1044   RDW 14.0 02/26/2014 1605   LYMPHSABS 1.1 05/23/2022 1044   LYMPHSABS 2.3 12/09/2011 1008   MONOABS 0.6 05/23/2022 1044   MONOABS 0.5 12/09/2011 1008   EOSABS 0.1 05/23/2022 1044   EOSABS 0.3 12/09/2011 1008   BASOSABS 0.0 05/23/2022 1044   BASOSABS 0.1 12/09/2011 1008     CMP     Component Value Date/Time   NA 137 05/23/2022 1044   NA 138 02/26/2014 1605   K 4.6 05/23/2022 1044   K 4.1 02/26/2014 1605   CL 102 05/23/2022 1044   CL 100 02/26/2014 1605   CO2 23 05/23/2022 1044   CO2 32 02/26/2014 1605   GLUCOSE 125 (H) 05/23/2022 1044   GLUCOSE 150 (H) 02/26/2014 1605   BUN 20 05/23/2022 1044   BUN 13 02/26/2014 1605   CREATININE 1.30 (H) 05/23/2022 1044   CREATININE 0.90 02/26/2014 1605   CALCIUM 8.2 (L) 05/23/2022 1044   CALCIUM 9.0 02/26/2014 1605   PROT 6.8 05/23/2022 1044   PROT 8.1 02/26/2014 1605   ALBUMIN 3.3 (L) 05/23/2022 1044   ALBUMIN 3.5 02/26/2014 1605    AST 35 05/23/2022 1044   AST 32 02/26/2014 1605   ALT 20 05/23/2022 1044   ALT 26 02/26/2014 1605   ALKPHOS 52 05/23/2022 1044   ALKPHOS 84 02/26/2014 1605   BILITOT 0.3 05/23/2022 1044   BILITOT 0.5 02/26/2014 1605   GFRNONAA 44 (L) 05/23/2022 1044   GFRNONAA >60 02/26/2014 1605   GFRNONAA >60 01/11/2012 1507   GFRAA 60 (L) 02/15/2020 0659   GFRAA >60 02/26/2014 1605   GFRAA >60 01/11/2012 1507    Imaging: DG Ankle 2 Views Left  Result Date: 05/23/2022 CLINICAL DATA:  Fall EXAM: LEFT ANKLE - 2 VIEW COMPARISON:  None available. FINDINGS: There is an acute oblique fracture of the  distal fibular metaphysis. Alignment is maintained. The ankle mortise appears intact on the provided views. There is marked surrounding soft tissue swelling. There is mild inferior calcaneal spurring and Achilles enthesopathy. IMPRESSION: Acute oblique nondisplaced fracture of the distal fibula. Electronically Signed   By: Valetta Mole M.D.   On: 05/23/2022 11:44   DG Chest Portable 1 View  Result Date: 05/23/2022 CLINICAL DATA:  Shortness of breath, respiratory failure. EXAM: PORTABLE CHEST 1 VIEW COMPARISON:  03/28/2022 and CT chest 09/10/2010. FINDINGS: Trachea is midline. Heart size stable. Bilateral perihilar opacification with streaky densities in the lung bases. There may be fluid along the minor fissure. Lungs are low in volume. Right hemidiaphragm is chronically elevated. IMPRESSION: Bilateral perihilar opacification may be due to vascular congestion. Difficult to exclude pneumonia. Consider CT chest with contrast in further evaluation, as clinically indicated. Electronically Signed   By: Lorin Picket M.D.   On: 05/23/2022 10:46    EKG: personally reviewed my interpretation is normal sinus rhythm. Prior EKG from 03/28/2022 appears unchanged.  ASSESSMENT & PLAN:   Assessment & Plan by Problem: Principal Problem:   Acute on chronic hypoxic respiratory failure (HCC)   MADOLYN ACKROYD is a 72 y.o.  person living with a history of COPD, HFpEF, OSA, CKD stage IIIb, atrial fibrillation, HTN, T2DM, hypothyroidism, anxiety and depression who presented with 2 weeks of dry cough and 1 day of dyspnea complicated by fall with left lower extremity injury yesterday. She is admitted for acute on chronic hypoxemic respiratory failure on hospital day 0  Acute on chronic hypoxemic respiratory failure Influenza A COPD Patient presented with need for BiPAP and was de-escalated to 5 L nasal cannula which is up from her home baseline of 2 L.  She is influenza A positive with bilateral opacifications in the perihilar regions which could be secondary to pneumonia.  It appears that she was started on Tamiflu by her facility.  Due to her underlying COPD we will also treat with steroids, antibiotics, and DuoNebs. - Continue Tamiflu and supplemental oxygen as needed - Prednisone 50 mg daily for 4 days after methylprednisolone 125 mg IV today - Azithromycin 500 mg daily for 3 days - DuoNebs every 6 hours - Plan to restart home Trelegy tomorrow  Oblique nondisplaced fracture of the distal left fibula Patient fell yesterday with injury to her left lower extremity and was found to have a fracture as above.  Orthopedics saw her and placed her in a cam boot with plans for outpatient follow-up.  HFpEF A-fib HTN Patient does not appear to be in acute exacerbation of HFpEF or in A-fib with RVR.  She appears to be in normal sinus rhythm.  She is anticoagulated on Eliquis but did not tolerate rate control in the past. - Continue home Farxiga 5 mg, spironolactone 12.5 mg, amlodipine 2.5 mg, Eliquis 5 mg twice daily  CKD stage IIIb T2DM A1c on 03/28/2022 was 7.3 and patient is doing well on Farxiga 5 mg daily.  Renal function appears to be at baseline with GFR of 44.  It appears that she has been on linagliptin in the past but we did not notice this on her medication list brought from the facility. - Continue Farxiga 5 mg  and continue to monitor blood sugars and renal function  Hypothyroidism TSH in 03/2022 was 10.2 and patient was started on levothyroxine 175 mcg daily. - Continue levothyroxine 175 mcg daily  Anxiety/Depression  Patient appears to have a long history of anxiety and  depression previously saw a mental health functional but had to stop seeing them about 3 years ago due to financial reasons.  She has been taking Wellbutrin 100 mg daily and topiramate 50 mg twice daily.  It appears that topiramate is mainly for weight loss but there is some confusing charting and it may be indicated procedures. - Continue home Wellbutrin and hold topiramate  Diet: Carb-Modified VTE: DOAC IVF: None, Code: DNR  Prior to Admission Living Arrangement:  [facility] Anticipated Discharge Location:  [facility] Barriers to Discharge: Improvement in acute hypoxemic respiratory failure  Dispo: Admit patient to Observation with expected length of stay less than 2 midnights.  Signed: Johny Blamer, DO Internal Medicine Resident PGY-1  05/23/2022, 6:39 PM   Dr. Johny Blamer, DO Pager [phone]

## 2022-05-23 NOTE — ED Notes (Signed)
Pt sister update with pt permission

## 2022-05-23 NOTE — Progress Notes (Signed)
Orthopedic Tech Progress Note Patient Details:  Glenda Boone [birth year] [id number]  Ortho Devices Type of Ortho Device: CAM walker Ortho Device/Splint Location: LLE Ortho Device/Splint Interventions: Ordered, Application, Adjustment   Post Interventions Patient Tolerated: Well Instructions Provided: Care of device, Adjustment of device  [facility] [facility] 05/23/2022, 2:39 PM

## 2022-05-23 NOTE — ED Provider Notes (Signed)
[facility] Provider Note   CSN: [id number] Arrival date & time: 05/23/22  1012     History  Chief Complaint  Patient presents with   Shortness of Breath    Glenda Boone is a 71 y.o. female.  The history is provided by the patient and medical records. No language interpreter was used.  Shortness of Breath Severity:  Severe Onset quality:  Gradual Duration:  1 day Timing:  Constant Progression:  Waxing and waning Chronicity:  New Context: URI   Relieved by:  Nothing Worsened by:  Coughing Ineffective treatments:  None tried Associated symptoms: cough, sputum production and wheezing   Associated symptoms: no abdominal pain, no chest pain, no fever, no headaches, no hemoptysis, no neck pain, no rash and no vomiting   Risk factors: no hx of PE/DVT        Home Medications Prior to Admission medications   Medication Sig Start Date End Date Taking? Authorizing Provider  acetaminophen (TYLENOL) 325 MG tablet Take 2 tablets (650 mg total) by mouth every 6 (six) hours as needed for mild pain (or Fever >/= 101). 04/03/22   Rick Duff, MD  albuterol (VENTOLIN HFA) 108 (90 Base) MCG/ACT inhaler Inhale 2 puffs into the lungs every 6 (six) hours as needed for wheezing or shortness of breath. 01/01/20   Mercy Riding, MD  amLODipine (NORVASC) 2.5 MG tablet Take 1 tablet (2.5 mg total) by mouth 2 (two) times daily. 04/03/22   Rick Duff, MD  apixaban (ELIQUIS) 5 MG TABS tablet Take 1 tablet (5 mg total) by mouth 2 (two) times daily. Patient not taking: Reported on 03/30/2022 02/16/20   Enzo Bi, MD  buPROPion ER [facility] SR) 100 MG 12 hr tablet Take 100 mg by mouth daily.    [provider]  busPIRone (BUSPAR) 10 MG tablet Take 10 mg by mouth 2 (two) times daily.    [provider]  busPIRone (BUSPAR) 5 MG tablet Take 5 mg by mouth 2 (two) times daily as needed (for anxiety).    [provider]   dapagliflozin propanediol (FARXIGA) 5 MG TABS tablet Take 1 tablet (5 mg total) by mouth daily. 04/04/22   Rick Duff, MD  hydrochlorothiazide (HYDRODIURIL) 25 MG tablet Held until outpatient followup due to intermittent low blood pressure. Patient not taking: Reported on 03/30/2022 02/16/20   Enzo Bi, MD  levothyroxine (SYNTHROID) 175 MCG tablet Take 175 mcg by mouth daily before breakfast.    [provider]  linagliptin (TRADJENTA) 5 MG TABS tablet Take 2.5 mg by mouth daily.    [provider]  Menthol, Topical Analgesic, (BIOFREEZE) 4 % GEL Apply 1 application  topically See admin instructions. Apply to affected area of the groin daily    [provider]  nicotine (NICODERM CQ - DOSED IN MG/24 HOURS) 14 mg/24hr patch Place 1 patch (14 mg total) onto the skin daily. Patient not taking: Reported on 03/30/2022 02/17/20   Enzo Bi, MD  polyethylene glycol powder [facility]) 17 GM/SCOOP powder Take 17 g by mouth daily.    [provider]  simvastatin (ZOCOR) 40 MG tablet Take 40 mg by mouth daily.    [provider]  tiZANidine (ZANAFLEX) 2 MG tablet Take 2 mg by mouth daily as needed for muscle spasms.    [provider]  topiramate (TOPAMAX) 25 MG tablet Take 50 mg by mouth 2 (two) times daily.    [provider]  Donnal Debar 100-62.5-25  MCG/ACT AEPB Inhale 1 puff into the lungs daily.    [provider]      Allergies    Codeine, Shellfish-derived products, Gabapentin, and Meperidine hcl    Review of Systems   Review of Systems  Constitutional:  Positive for chills and fatigue. Negative for fever.  HENT:  Positive for congestion.   Eyes:  Negative for visual disturbance.  Respiratory:  Positive for cough, sputum production, chest tightness, shortness of breath and wheezing. Negative for hemoptysis and stridor.   Cardiovascular:  Positive for leg swelling (chronic per pt). Negative for chest pain and  palpitations.  Gastrointestinal:  Negative for abdominal pain, constipation, diarrhea, nausea and vomiting.  Genitourinary:  Negative for dysuria.  Musculoskeletal:  Negative for back pain, neck pain and neck stiffness.  Skin:  Negative for rash and wound.  Neurological:  Negative for weakness, light-headedness, numbness and headaches.  Psychiatric/Behavioral:  Negative for agitation and confusion.   All other systems reviewed and are negative.   Physical Exam Updated Vital Signs Pulse 76   Temp 98.5 F (36.9 C) (Oral)   Ht '5\' 4"'$  (1.626 m)   Wt (!) 160.1 kg   SpO2 (!) 82% Comment: just with tranfering to out bipap  BMI 60.58 kg/m  Physical Exam Vitals and nursing note reviewed.  Constitutional:      General: She is not in acute distress.    Appearance: She is well-developed. She is ill-appearing. She is not toxic-appearing or diaphoretic.  HENT:     Head: Normocephalic and atraumatic.  Eyes:     Conjunctiva/sclera: Conjunctivae normal.     Pupils: Pupils are equal, round, and reactive to light.  Cardiovascular:     Rate and Rhythm: Normal rate and regular rhythm.     Heart sounds: No murmur heard. Pulmonary:     Effort: Tachypnea and respiratory distress present.     Breath sounds: Wheezing, rhonchi and rales present.  Chest:     Chest wall: No tenderness.  Abdominal:     Palpations: Abdomen is soft.     Tenderness: There is no abdominal tenderness.  Musculoskeletal:        General: No swelling.     Cervical back: Neck supple.     Right lower leg: No tenderness. Edema present.     Left lower leg: No tenderness. Edema present.  Skin:    General: Skin is warm and dry.     Capillary Refill: Capillary refill takes less than 2 seconds.     Findings: No erythema.  Neurological:     General: No focal deficit present.     Mental Status: She is alert.  Psychiatric:        Mood and Affect: Mood normal.     ED Results / Procedures / Treatments   Labs (all labs ordered  are listed, but only abnormal results are displayed) Labs Reviewed  RESP PANEL BY RT-PCR (RSV, FLU A&B, COVID)  RVPGX2 - Abnormal; Notable for the following components:      Result Value   Influenza A by PCR POSITIVE (*)    All other components within normal limits  CBC WITH DIFFERENTIAL/PLATELET - Abnormal; Notable for the following components:   RBC 3.43 (*)    Hemoglobin 10.5 (*)    HCT 34.1 (*)    All other components within normal limits  COMPREHENSIVE METABOLIC PANEL - Abnormal; Notable for the following components:   Glucose, Bld 125 (*)    Creatinine, Ser 1.30 (*)  Calcium 8.2 (*)    Albumin 3.3 (*)    GFR, Estimated 44 (*)    All other components within normal limits  LACTIC ACID, PLASMA  BRAIN NATRIURETIC PEPTIDE  LACTIC ACID, PLASMA  TROPONIN I (HIGH SENSITIVITY)  TROPONIN I (HIGH SENSITIVITY)    EKG EKG Interpretation  Date/Time:  Friday May 23 2022 10:22:37 EST Ventricular Rate:  79 PR Interval:    QRS Duration: 93 QT Interval:  401 QTC Calculation: 460 R Axis:   67 Text Interpretation: sinus rhythm. when compared to prior, slower rate and now appears sinus. NO STEMI Confirmed by Antony Blackbird [phone]) on 05/23/2022 10:30:26 AM  Radiology DG Ankle 2 Views Left  Result Date: 05/23/2022 CLINICAL DATA:  Fall EXAM: LEFT ANKLE - 2 VIEW COMPARISON:  None available. FINDINGS: There is an acute oblique fracture of the distal fibular metaphysis. Alignment is maintained. The ankle mortise appears intact on the provided views. There is marked surrounding soft tissue swelling. There is mild inferior calcaneal spurring and Achilles enthesopathy. IMPRESSION: Acute oblique nondisplaced fracture of the distal fibula. Electronically Signed   By: Valetta Mole M.D.   On: 05/23/2022 11:44   DG Chest Portable 1 View  Result Date: 05/23/2022 CLINICAL DATA:  Shortness of breath, respiratory failure. EXAM: PORTABLE CHEST 1 VIEW COMPARISON:  03/28/2022 and CT chest  09/10/2010. FINDINGS: Trachea is midline. Heart size stable. Bilateral perihilar opacification with streaky densities in the lung bases. There may be fluid along the minor fissure. Lungs are low in volume. Right hemidiaphragm is chronically elevated. IMPRESSION: Bilateral perihilar opacification may be due to vascular congestion. Difficult to exclude pneumonia. Consider CT chest with contrast in further evaluation, as clinically indicated. Electronically Signed   By: Lorin Picket M.D.   On: 05/23/2022 10:46    Procedures Procedures    CRITICAL CARE Performed by: Gwenyth Allegra Kristain Hu Total critical care time: 35 minutes Critical care time was exclusive of separately billable procedures and treating other patients. Critical care was necessary to treat or prevent imminent or life-threatening deterioration. Critical care was time spent personally by me on the following activities: development of treatment plan with patient and/or surrogate as well as nursing, discussions with consultants, evaluation of patient's response to treatment, examination of patient, obtaining history from patient or surrogate, ordering and performing treatments and interventions, ordering and review of laboratory studies, ordering and review of radiographic studies, pulse oximetry and re-evaluation of patient's condition.   Medications Ordered in ED Medications  magnesium sulfate IVPB 2 g 50 mL (0 g Intravenous Stopped 05/23/22 1242)  methylPREDNISolone sodium succinate (SOLU-MEDROL) 125 mg/2 mL injection 125 mg (125 mg Intravenous Given 05/23/22 1047)    ED Course/ Medical Decision Making/ A&P                           Medical Decision Making Amount and/or Complexity of Data Reviewed Labs: ordered. Radiology: ordered.  Risk Prescription drug management. Decision regarding hospitalization.    MILEYDI MILSAP is a 72 y.o. female with medical history significant for hypertension, hypercholesterolemia,  hypothyroidism, diabetes, previous TIA, depression, COPD on 2 L home oxygen by report, atrial fibrillation on Eliquis therapy, previous cholecystectomy, hysterectomy, and thyroid disease who presents with hypoxic respiratory failure and shortness of breath as well as left ankle pain after fall.  According to patient, she had a fall last night and is having pain in her left ankle.  She says that she had today she had  worsened breathing but no chest pain.  She reports she has not had much cough but has had worsening shortness of breath today.  She denies any chest pain or palpitations.  She reports no nausea, vomiting, constipation, diarrhea, or urinary changes.  She is unsure if this feels like when she has had fluid overload or when she had COPD exacerbation.  She was given albuterol with EMS and transferred on CPAP for hypoxic respiratory failure.  Oxygen saturations reportedly in the 70s at home.  On my arrival, she still has some wheezing rales, and rhonchi.  Chest is nontender abdomen is nontender.  She does have edema in extremities which she reports is chronic.  She is not tachycardic and is slightly tachypneic.  Clinically I am concerned about fluid overload versus pneumonia versus heart failure exacerbation versus COPD exacerbation.  Will get chest x-ray and labs.  Will give Solu-Medrol and magnesium given the hypoxia in the 70s, respiratory failure, and wheezing.  For the ankle pain will get x-ray.  Ankle x-ray shows fibular fracture.  Orthopedics was called who will come see her and give [place] a plan as far as splint versus boot.  She will need admission for hypoxic respiratory failure when workup is completed.  1:15 PM Patient is flu a positive.  When metabolic panel has returned she will be admitted for hypoxic respiratory failure in the setting of influenza.  Will await orthopedic recommendations for the ankle.  Orthopedics feel she is appropriate for a cam walker with weightbearing as  tolerated and follow-up in 2 weeks with Dr. Stann Mainland orthopedics.  Due to the hypoxic respiratory failure will need admission.  Patient returned flu a positive.  She has been weaned off of BiPAP and is now on 4 L.  This is slightly more than she typically takes at 2 L at home and, given the hypoxia in the 70s and concern for a degree of COPD exacerbation, will call for admission.  Patient agrees with this plan.         Final Clinical Impression(s) / ED Diagnoses Final diagnoses:  Influenza A  Acute respiratory failure with hypoxia (HCC)  Closed fracture of left ankle, initial encounter     Clinical Impression: 1. Influenza A   2. Acute respiratory failure with hypoxia (HCC)   3. Closed fracture of left ankle, initial encounter     Disposition: Admit  This note was prepared with assistance of Dragon voice recognition software. Occasional wrong-word or sound-a-like substitutions may have occurred due to the inherent limitations of voice recognition software.     Chanin Frumkin, Gwenyth Allegra, MD 05/23/22 1400

## 2022-05-23 NOTE — Progress Notes (Signed)
Pt off bipap at this time. RT will continue to be available as needed.

## 2022-05-23 NOTE — ED Triage Notes (Signed)
At baseline 5L

## 2022-05-23 NOTE — Hospital Course (Addendum)
1/2 Achy pain all over, not new. Hasn't had a bowel movement in the 5 days being here. CPAP helps with sleep. Hasn been ambulating as she has been afraid to. Says she has fallen many times at [facility] and has rib and ankle fractures from this. Ankle pain ok as long as not moving it. Says she is having trouble with memory and time while in the hospital. Wants to know if she has made progress, doesn't know if she has. She is still very tired.  4LNC, normal wob, talking in normal sentence length Pure wic in place, urine yellow  Acute on chronic hypoxemic respiratory failure Influenza A COPD Patient presented from [place] facility with 1 day of acute dyspnea associated with 3 days of fatigue.  She was initially on BiPAP when she got to the ED but was quickly stable on 5 L nasal cannula.  She is on 2 L nasal cannula at home.  She was found to be flu a positive and her chest x-ray showed bilateral opacifications in the perihilar regions concerning for pneumonia.  With her underlying COPD we treated her for both influenza and a COPD exacerbation which she responded well to.  She initially fluctuated between 3-4 L on nasal cannula.  We then became concerned that she had some extra fluid on her lungs that is complicating her continued recovery.  With IV diuresis she was able to saturate well at rest on her home 2 L.  We are currently hoping she can continue on her home 2 L and work with PT to be able to transfer from her bed to a chair without significant desaturations.  The goal is to be able to do this on less than 4 L.  Total treatment included 5 days of Tamiflu, 1 day of methylprednisolone IV, 4 days of prednisone, and 3 days of azithromycin.   HFpEF On admission patient did not appear to be in an acute heart failure exacerbation.  Her BNP was low however this could be falsely low due to her BMI and age.  She does have chronic edema but did not think that it was worse than usual on admission.  After  initial treatments she did have a setback in her respiratory status and showed signs of increased fluid on exam and on chest x-ray.  She has not been on outpatient diuretics.  We treated her with Lasix 40 mg IV which she responded very well to, -2 L after first dose.  She was able to saturate well on her home 2 L at rest after this.  We expect that she will need a loop diuretic on discharge.  Expected will be Lasix 20 or 40 mg daily.  We are also watching her creatinine is in a slight increase from 1.3-1.4 after the first dose of Lasix.  We continued her [place].   Oblique nondisplaced fracture of the distal left fibula Patient fell the day prior to admission with injury to her left lower extremity and was found to have a fracture as above.  Orthopedics saw her and placed her in a cam boot with plans for outpatient follow-up.  She was cleared for weightbearing as tolerated and has been working with PT.   A-fib HTN Patient has history of A-fib but is not on rate control or rhythm control.  On tele patient has had brief sinus tachycardia and sinus bradycardia without symptoms. Rates vary from 50-120 mostly with a majority in NSR. Unknown if related to certain activities including brief  sleep or exertion.  Her blood pressures were initially low when we held some of her antihypertensives.  Pharmacy later clarified her medications with her.  It appears that she is taking amlodipine 2.5 mg daily and spironolactone 25 mg daily.  Will be further to clarify this.  We did continue her Eliquis 5 mg twice daily.   CKD stage IIIb T2DM A1c on 03/28/2022 was 7.3 and patient is doing well on Farxiga 5 mg daily.  Renal function on admission appeared to be at baseline with GFR of 44.  We continued her Farxiga 5 mg daily and/scale insulin.  Blood sugars are relatively well-controlled especially on prednisone.  We are continue to monitor her renal function as we continue IV diuresis.   Hypothyroidism TSH in 03/2022 was  10.2 and patient was started on levothyroxine 175 mcg daily.  Pharmacy did confirm that the patient was on 200 mcg daily so that was changed.   Anxiety/Depression  Patient appears to have a long history of anxiety and depression previously saw a mental health professional but had to stop seeing them about 3 years ago due to financial reasons.  She has been taking Wellbutrin 100 mg daily and topiramate 50 mg twice daily.  It appears that topiramate is mainly for weight loss. - Continue home Wellbutrin and hold topiramate    1/5 Breathing is doing better. It still has its moments. Says she will sit there and get anxious which will cause some SOB. Feels better at the edge of the bed. No CP. Feels HR is ok. Tired overall. She is exhausted from everything going on. Poor sleep last night. Trying to move around to exercise legs, they still hurt.

## 2022-05-23 NOTE — TOC Initial Note (Signed)
Transition of Care [facility]) - Initial/Assessment Note    Patient Details  Name: Glenda Boone MRN: [id number] Date of Birth: [birth year]  Transition of Care [facility]) CM/SW Contact:    Verdell Carmine, RN Phone Number: 05/23/2022, 5:05 PM  Clinical Narrative:                 Patient presented to ED with [facility]. Hx of COPD smoker on oxygen at 2L. On 4LPM here after BiPap.  Fell yesterday uses cane at home, c/o ankle pain. Fibula fx noted ortho consulted. CAM walker procured for patient from ortho tech. Will likely need Home health and further DME for home.  Will need oxygen qualifications prior to DC  , Will need PT consult once stable. TOC will follow for needs recommendations, and transitions of care  Expected Discharge Plan: [place] Barriers to Discharge: Continued Medical Work up   Patient Goals and CMS Choice            Expected Discharge Plan and Services                                              Prior Living Arrangements/Services   Lives with:: Self Patient language and need for interpreter reviewed:: Yes        Need for Family Participation in Patient Care: Yes (Comment) Care giver support system in place?: Yes (comment) Current home services: DME (cane) Criminal Activity/Legal Involvement Pertinent to Current Situation/Hospitalization: No - Comment as needed  Activities of Daily Living      Permission Sought/Granted                  Emotional Assessment       Orientation: : Oriented to Self, Oriented to Place, Oriented to  Time, Oriented to Situation Alcohol / Substance Use: Not Applicable Psych Involvement: No (comment)  Admission diagnosis:  Acute on chronic hypoxic respiratory failure (HCC) [J96.21] Patient Active Problem List   Diagnosis Date Noted   Acute on chronic hypoxic respiratory failure ([place]) 05/23/2022   COPD with acute exacerbation ([place]) 03/28/2022   Acute on chronic heart failure with preserved  ejection fraction (HFpEF) ([place]) 03/28/2022   Acquired thrombophilia ([place]) 02/12/2020   Current use of long term anticoagulation 02/12/2020   Current every day smoker 02/12/2020   Atrial fibrillation with rapid ventricular response ([place]) 02/11/2020   Hyperthyroidism 02/11/2020   Pneumonia due to COVID-19 virus 01/31/2020   COPD (chronic obstructive pulmonary disease) ([place]) 01/31/2020   Essential hypertension 01/31/2020   Hypoxia 01/31/2020   Obesity, Class III, BMI 40-49.9 (morbid obesity) ([place]) 01/31/2020   Acute respiratory failure with hypoxia ([place])    Type 2 diabetes mellitus with hyperlipidemia ([place])    Hypothyroidism    Acute metabolic encephalopathy 63/05/6008   TIA (transient ischemic attack) 09/04/2019   MDD (major depressive disorder), recurrent, in full remission ([place]) 04/25/2019   Aphagia 03/09/2019   Shingles outbreak 06/21/2015   Chronic bronchitis ([place]) 06/21/2015   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[place]) 11/28/2014   H/O diabetes mellitus 11/28/2014   Anxiety, generalized 11/28/2014   H/O hypercholesterolemia 11/28/2014   H/O: HTN (hypertension) 11/28/2014   H/O: hypothyroidism 11/28/2014   Depression, major, recurrent, moderate ([place]) 11/28/2014   H/O: obesity 11/28/2014   H/O disease 11/28/2014   Neuropathy 11/28/2014   PCP:  [facility] [place]:  [place], [place] - [address] [place] [address] Phone: [phone] Fax: [phone]     Social Determinants of Health (SDOH) Social History: [place]: No Food Insecurity (03/28/2022)  Housing: Low Risk  (03/28/2022)  Transportation Needs: No Transportation Needs (03/28/2022)  Utilities: Not At Risk (03/28/2022)  Tobacco Use: High Risk (05/23/2022)   SDOH Interventions:     Readmission Risk Interventions    02/02/2020    5:30 PM  Readmission Risk Prevention Plan  Transportation Screening Complete   PCP or Specialist Appt within 3-5 Days Complete  HRI or [place] Complete  Social Work Consult for [place] Planning/Counseling Complete  Palliative Care Screening Not Applicable  Medication Review Press photographer) Complete

## 2022-05-23 NOTE — ED Notes (Signed)
Help get patient undressed into a gown on the monitor did EKG shown to Dr Tegeler patient is resting with call bell in reach and nurse at bedside

## 2022-05-24 DIAGNOSIS — S82892A Other fracture of left lower leg, initial encounter for closed fracture: Secondary | ICD-10-CM

## 2022-05-24 DIAGNOSIS — J101 Influenza due to other identified influenza virus with other respiratory manifestations: Secondary | ICD-10-CM

## 2022-05-24 DIAGNOSIS — J9621 Acute and chronic respiratory failure with hypoxia: Secondary | ICD-10-CM | POA: Diagnosis not present

## 2022-05-24 DIAGNOSIS — F17210 Nicotine dependence, cigarettes, uncomplicated: Secondary | ICD-10-CM | POA: Diagnosis not present

## 2022-05-24 LAB — GLUCOSE, CAPILLARY
Glucose-Capillary: 107 mg/dL — ABNORMAL HIGH (ref 70–99)
Glucose-Capillary: 314 mg/dL — ABNORMAL HIGH (ref 70–99)
Glucose-Capillary: 220 mg/dL — ABNORMAL HIGH (ref 70–99)

## 2022-05-24 LAB — CBC
HCT: 36.2 % (ref 36.0–46.0)
Hemoglobin: 10.9 g/dL — ABNORMAL LOW (ref 12.0–15.0)
MCH: 30 pg (ref 26.0–34.0)
MCHC: 30.1 g/dL (ref 30.0–36.0)
MCV: 99.7 fL (ref 80.0–100.0)
Platelets: 213 10*3/uL (ref 150–400)
RBC: 3.63 MIL/uL — ABNORMAL LOW (ref 3.87–5.11)
RDW: 15.2 % (ref 11.5–15.5)
WBC: 6.3 10*3/uL (ref 4.0–10.5)
nRBC: 0 % (ref 0.0–0.2)

## 2022-05-24 LAB — BASIC METABOLIC PANEL
Anion gap: 10 (ref 5–15)
BUN: 21 mg/dL (ref 8–23)
CO2: 25 mmol/L (ref 22–32)
Calcium: 8.4 mg/dL — ABNORMAL LOW (ref 8.9–10.3)
Chloride: 104 mmol/L (ref 98–111)
Creatinine, Ser: 1.39 mg/dL — ABNORMAL HIGH (ref 0.44–1.00)
GFR, Estimated: 40 mL/min — ABNORMAL LOW (ref >60)
Glucose, Bld: 158 mg/dL — ABNORMAL HIGH (ref 70–99)
Potassium: 5.2 mmol/L — ABNORMAL HIGH (ref 3.5–5.1)
Sodium: 139 mmol/L (ref 135–145)

## 2022-05-24 MED ORDER — UMECLIDINIUM BROMIDE 62.5 MCG/ACT IN AEPB
1.0000 | INHALATION_SPRAY | Freq: Every day | RESPIRATORY_TRACT | Status: DC
  Administered 2022-05-25 – 2022-05-30 (×6): 1 via RESPIRATORY_TRACT
  Filled 2022-05-24: qty 7

## 2022-05-24 MED ORDER — ATORVASTATIN CALCIUM 10 MG PO TABS
20.0000 mg | ORAL_TABLET | Freq: Every day | ORAL | Status: DC
  Administered 2022-05-25 – 2022-05-30 (×6): 20 mg via ORAL
  Filled 2022-05-24 (×6): qty 2

## 2022-05-24 MED ORDER — FLUTICASONE FUROATE-VILANTEROL 100-25 MCG/ACT IN AEPB
1.0000 | INHALATION_SPRAY | Freq: Every day | RESPIRATORY_TRACT | Status: DC
  Administered 2022-05-25 – 2022-05-30 (×6): 1 via RESPIRATORY_TRACT
  Filled 2022-05-24: qty 28

## 2022-05-24 MED ORDER — IPRATROPIUM-ALBUTEROL 0.5-2.5 (3) MG/3ML IN SOLN
3.0000 mL | Freq: Four times a day (QID) | RESPIRATORY_TRACT | Status: DC | PRN
  Administered 2022-05-24 – 2022-05-26 (×6): 3 mL via RESPIRATORY_TRACT
  Filled 2022-05-24 (×7): qty 3

## 2022-05-24 MED ORDER — INSULIN ASPART 100 UNIT/ML IJ SOLN
0.0000 [IU] | Freq: Three times a day (TID) | INTRAMUSCULAR | Status: DC
  Administered 2022-05-24: 11 [IU] via SUBCUTANEOUS
  Administered 2022-05-25 (×2): 2 [IU] via SUBCUTANEOUS
  Administered 2022-05-25: 5 [IU] via SUBCUTANEOUS
  Administered 2022-05-26: 3 [IU] via SUBCUTANEOUS
  Administered 2022-05-26: 8 [IU] via SUBCUTANEOUS
  Administered 2022-05-26: 3 [IU] via SUBCUTANEOUS
  Administered 2022-05-27: 2 [IU] via SUBCUTANEOUS
  Administered 2022-05-27: 5 [IU] via SUBCUTANEOUS
  Administered 2022-05-28: 2 [IU] via SUBCUTANEOUS
  Administered 2022-05-28: 8 [IU] via SUBCUTANEOUS
  Administered 2022-05-29 (×3): 2 [IU] via SUBCUTANEOUS
  Administered 2022-05-30: 3 [IU] via SUBCUTANEOUS

## 2022-05-24 MED ORDER — ATORVASTATIN CALCIUM 10 MG PO TABS: 20.0000 mg | ORAL_TABLET | Freq: Every day | ORAL | Status: DC

## 2022-05-24 MED ORDER — AMLODIPINE BESYLATE 2.5 MG PO TABS
2.5000 mg | ORAL_TABLET | Freq: Two times a day (BID) | ORAL | Status: DC
  Administered 2022-05-24 – 2022-05-26 (×5): 2.5 mg via ORAL
  Filled 2022-05-24 (×5): qty 1

## 2022-05-24 NOTE — Progress Notes (Signed)
HD#0 Subjective:   Summary: Glenda Boone is a 71 y.o. female with a history of COPD, HFpEF, OSA, CKD stage IIIb, atrial fibrillation, HTN, T2DM, hypothyroidism, anxiety and depression who presented with 2 weeks of dry cough and 1 day of dyspnea complicated by fall with left lower extremity injury 1 day PTA. She is admitted for acute on chronic hypoxemic respiratory failure.  Overnight Events: None   Pt is stable this morning. She did not sleep very well but that is not new for her. She denies any worsening of her dyspnea or productive cough.  She is still having shortness of breath when trying to speak a lot.  She states that her left leg is still a little painful but is not getting any worse.  Objective:  Vital signs in last 24 hours: Vitals:   05/24/22 0120 05/24/22 0400 05/24/22 0734 05/24/22 0923  BP:      Pulse:  89 79   Resp:      Temp:  98 F (36.7 C) 98.3 F (36.8 C)   TempSrc:  Oral Oral   SpO2: 96% 96% 98% 98%  Weight:      Height:       Supplemental O2: Nasal Cannula SpO2: 98 % O2 Flow Rate (L/min): 4 L/min FiO2 (%): 50 %   Physical Exam:  Constitutional: Morbidly obese and chronically ill-appearing elderly female. In no acute distress. Cardio:Regular rate and rhythm.  Pulm: Decreased air movement throughout lung fields with expiratory crackles.  MSK: Cam boot over the left lower extremity, 1-2+ pitting edema in the lower extremities bilaterally to the knee Skin:Warm and dry.  Venous stasis dermatitis changes over bilateral lower extremities distally. Neuro:Alert and oriented x3. No focal deficit noted.  Filed Weights   05/23/22 1019 05/23/22 2000  Weight: (!) 160.1 kg (!) 164.6 kg     Intake/Output Summary (Last 24 hours) at 05/24/2022 1204 Last data filed at 05/24/2022 1034 Gross per 24 hour  Intake 240 ml  Output --  Net 240 ml   Net IO Since Admission: 240 mL [05/24/22 1204]  Pertinent Labs:    Latest Ref Rng & Units 05/24/2022    2:52  AM 05/23/2022   10:44 AM 04/03/2022    5:52 AM  CBC  WBC 4.0 - 10.5 K/uL 6.3  7.3  10.2   Hemoglobin 12.0 - 15.0 g/dL 10.9  10.5  10.1   Hematocrit 36.0 - 46.0 % 36.2  34.1  31.8   Platelets 150 - 400 K/uL 213  228  233        Latest Ref Rng & Units 05/24/2022    2:52 AM 05/23/2022   10:44 AM 04/03/2022    5:52 AM  CMP  Glucose 70 - 99 mg/dL 158  125  117   BUN 8 - 23 mg/dL 21  20  47   Creatinine 0.44 - 1.00 mg/dL 1.39  1.30  1.70   Sodium 135 - 145 mmol/L 139  137  136   Potassium 3.5 - 5.1 mmol/L 5.2  4.6  4.1   Chloride 98 - 111 mmol/L 104  102  97   CO2 22 - 32 mmol/L '25  23  29   '$ Calcium 8.9 - 10.3 mg/dL 8.4  8.2  8.6   Total Protein 6.5 - 8.1 g/dL  6.8    Total Bilirubin 0.3 - 1.2 mg/dL  0.3    Alkaline Phos 38 - 126 U/L  52    AST 15 -  41 U/L  35    ALT 0 - 44 U/L  20      Imaging: No results found.  Assessment/Plan:   Principal Problem:   Acute on chronic hypoxic respiratory failure ([place])   Patient Summary: Glenda Boone is a 71 y.o. female with a history of COPD, HFpEF, OSA, CKD stage IIIb, atrial fibrillation, HTN, T2DM, hypothyroidism, anxiety and depression who presented with 2 weeks of dry cough and 1 day of dyspnea complicated by fall with left lower extremity injury 1 day PTA. She is admitted for acute on chronic hypoxemic respiratory failure.   Acute on chronic hypoxemic respiratory failure Influenza A COPD Patient presented with need for BiPAP and was de-escalated to 5 L nasal cannula which is up from her home baseline of 2 L.  She is influenza A positive with bilateral opacifications in the perihilar regions which could be secondary to pneumonia.  She is continue to require 3-4 L of oxygen to maintain sats above 90's.  - Continue Tamiflu for 5 days (day 2) and supplemental oxygen as needed - Prednisone 50 mg daily for 4 days after methylprednisolone 125 mg IV yesterday - Azithromycin 500 mg daily for 3 days, Day 2 - DuoNebs every 6 hours prn -  Restart home Trelegy (Breo Ellipta and Incruse Ellipta daily) - PT/OT evaluation with overall goal of ability to transfer from bed to chair/wheelchair without significant desaturation on 4 L or less.   Oblique nondisplaced fracture of the distal left fibula Patient fell yesterday with injury to her left lower extremity and was found to have a fracture as above.  Orthopedics saw her and placed her in a cam boot with plans for outpatient follow-up.  Weightbearing as tolerated.  Will work with PT and OT.   HFpEF A-fib HTN Patient does not appear to be in acute exacerbation of HFpEF or in A-fib with RVR.  She appears to be in normal sinus rhythm.  She is anticoagulated on Eliquis but did not tolerate rate control in the past. - Continue home Farxiga 5 mg, amlodipine 2.5 mg, Eliquis 5 mg twice daily   CKD stage IIIb T2DM A1c on 03/28/2022 was 7.3 and patient is doing well on Farxiga 5 mg daily.  Renal function appears to be at baseline with GFR of 44.  It appears that she has been on linagliptin in the past but we did not notice this on her medication list brought from the facility. - Continue Farxiga 5 mg and continue to monitor blood sugars and renal function   Hypothyroidism TSH in 03/2022 was 10.2 and patient was started on levothyroxine 175 mcg daily. - Continue levothyroxine 175 mcg daily   Anxiety/Depression  Patient appears to have a long history of anxiety and depression previously saw a mental health functional but had to stop seeing them about 3 years ago due to financial reasons.  She has been taking Wellbutrin 100 mg daily and topiramate 50 mg twice daily.  It appears that topiramate is mainly for weight loss but there is some confusing charting and it may be indicated procedures. - Continue home Wellbutrin and hold topiramate  Diet: Carb-Modified IVF: None, VTE: DOAC Code: DNR PT/OT recs: SNF for Subacute PT, . Family Update: Attempted to call sister without answer.   Dispo:  Anticipated discharge to Skilled nursing facility in 1-3 days pending further improvement in respiratory status.   Johny Blamer, DO Internal Medicine Resident PGY-1 Pager: [phone]  Please contact the on call pager after  5 pm and on weekends at [phone].
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
## 2022-05-24 NOTE — Evaluation (Signed)
Physical Therapy Evaluation Patient Details Name: Glenda Boone MRN: [id number] DOB: [birth year] Today's Date: 05/24/2022  History of Present Illness  71 y.o. female presents to [facility] hospital on 05/23/2022 after a fall with LLE injury, pt also with cough and dyspnea in days preceding fall. Pt found to be flu A positive and have an oblique nondisplaced distal L fibula fx. PMH includes COPD, CHF, OSA, CKD III, afib, HTN, DMII, anxiety, depression.  Clinical Impression  Pt presents to PT with deficits in functional mobility, strength, power, endurance, balance, gait. Pt requires significant assistance to perform bed mobility and to stand. Pt is anxious and reports a significant fear of falling, which is reasonable due to weakness and mobility impairments at this time. Pt will benefit from continued attempts at mobilization in an effort to improve strength and reduce falls risk. PT recommends return to SNF with PT services when medically ready.       Recommendations for follow up therapy are one component of a multi-disciplinary discharge planning process, led by the attending physician.  Recommendations may be updated based on patient status, additional functional criteria and insurance authorization.  Follow Up Recommendations Skilled nursing-short term rehab (<3 hours/day) Can patient physically be transported by private vehicle: No    Assistance Recommended at Discharge Frequent or constant Supervision/Assistance  Patient can return home with the following  Two people to help with walking and/or transfers;Two people to help with bathing/dressing/bathroom;Assistance with cooking/housework;Direct supervision/assist for medications management;Direct supervision/assist for financial management;Assist for transportation;Help with stairs or ramp for entrance    Equipment Recommendations None recommended by PT  Recommendations for Other Services       Functional Status Assessment Patient has had a  recent decline in their functional status and demonstrates the ability to make significant improvements in function in a reasonable and predictable amount of time.     Precautions / Restrictions Precautions Precautions: Fall Restrictions Weight Bearing Restrictions: Yes LLE Weight Bearing: Weight bearing as tolerated Other Position/Activity Restrictions: WBAT in CAM boot      Mobility  Bed Mobility Overal bed mobility: Needs Assistance Bed Mobility: Sit to Supine, Supine to Sit, Rolling Rolling: Max assist   Supine to sit: Max assist, HOB elevated Sit to supine: Max assist, +2 for physical assistance        Transfers Overall transfer level: Needs assistance Equipment used: 1 person hand held assist Transfers: Sit to/from Stand Sit to Stand: Max assist, +2 physical assistance           General transfer comment: pt requires significant assistance to power up into standing, incomplete hip extension noted during standing    Ambulation/Gait                  Stairs            Wheelchair Mobility    Modified Rankin (Stroke Patients Only)       Balance Overall balance assessment: Needs assistance Sitting-balance support: No upper extremity supported, Feet supported Sitting balance-Leahy Scale: Fair     Standing balance support: Bilateral upper extremity supported Standing balance-Leahy Scale: Poor Standing balance comment: maxA x 2                             Pertinent Vitals/Pain Pain Assessment Pain Assessment: Faces Faces Pain Scale: Hurts even more Pain Location: buttocks Pain Descriptors / Indicators: Sore Pain Intervention(s): Monitored during session    Home Living Family/patient expects to  be discharged to:: Skilled nursing facility                   Additional Comments: Resident of [facility] SNF    Prior Function Prior Level of Function : Needs assist             Mobility Comments: pt reports ambulating  very short distances with support of SPC at baseline ADLs Comments: assistance for ADLs from staff     Hand Dominance   Dominant Hand: Right    Extremity/Trunk Assessment   Upper Extremity Assessment Upper Extremity Assessment: Generalized weakness    Lower Extremity Assessment Lower Extremity Assessment: Generalized weakness    Cervical / Trunk Assessment Cervical / Trunk Assessment: Other exceptions Cervical / Trunk Exceptions: morbid obesity  Communication   Communication: No difficulties  Cognition Arousal/Alertness: Awake/alert Behavior During Therapy: WFL for tasks assessed/performed Overall Cognitive Status: Within Functional Limits for tasks assessed                                          General Comments General comments (skin integrity, edema, etc.): pt on 3L [place], desats with lying flat, sats improve with upright positioning    Exercises     Assessment/Plan    PT Assessment Patient needs continued PT services  PT Problem List Decreased strength;Decreased activity tolerance;Decreased mobility;Decreased balance;Decreased knowledge of use of DME;Cardiopulmonary status limiting activity       PT Treatment Interventions DME instruction;Gait training;Functional mobility training;Therapeutic activities;Therapeutic exercise;Balance training;Neuromuscular re-education;Patient/family education;Cognitive remediation;Wheelchair mobility training    PT Goals (Current goals can be found in the Care Plan section)  Acute Rehab PT Goals Patient Stated Goal: to improve strength and mobility quality PT Goal Formulation: With patient Time For Goal Achievement: 06/07/22 Potential to Achieve Goals: Fair    Frequency Min 2X/week     Co-evaluation               AM-PAC PT "6 Clicks" Mobility  Outcome Measure Help needed turning from your back to your side while in a flat bed without using bedrails?: A Lot Help needed moving from lying on your back  to sitting on the side of a flat bed without using bedrails?: A Lot Help needed moving to and from a bed to a chair (including a wheelchair)?: Total Help needed standing up from a chair using your arms (e.g., wheelchair or bedside chair)?: Total Help needed to walk in hospital room?: Total Help needed climbing 3-5 steps with a railing? : Total 6 Click Score: 8    End of Session Equipment Utilized During Treatment: Oxygen Activity Tolerance: Patient tolerated treatment well Patient left: in bed;with call bell/phone within reach;with bed alarm set;with nursing/sitter in room Nurse Communication: Mobility status;Need for lift equipment PT Visit Diagnosis: Other abnormalities of gait and mobility (R26.89);Muscle weakness (generalized) (M62.81);History of falling (Z91.81)    Time: 1000-1026 PT Time Calculation (min) (ACUTE ONLY): 26 min   Charges:   PT Evaluation $PT Eval Low Complexity: [place], PT, DPT Acute Rehabilitation Office [phone]   Zenaida Niece 05/24/2022, 11:03 AM

## 2022-05-24 NOTE — Progress Notes (Signed)
CSW received message from LPN stating that patient's family had concerns about [place] and did not want her to return there.  CSW spoke with Anderson Malta, Scientist, physiological at [facility] [place] to inform her of family concerns. Anderson Malta will reach out to family directly to discuss concerns.  Madilyn Fireman, MSW, LCSW Transitions of Care  Clinical Social Worker II [phone]

## 2022-05-24 NOTE — Evaluation (Signed)
Occupational Therapy Evaluation Patient Details Name: Glenda Boone MRN: [id number] DOB: [birth year] Today's Date: 05/24/2022   History of Present Illness 71 y.o. female presents to [facility] hospital on 05/23/2022 after a fall with LLE injury, pt also with cough and dyspnea in days preceding fall. Pt found to be flu A positive and have an oblique nondisplaced distal L fibula fx. PMH includes COPD, CHF, OSA, CKD III, afib, HTN, DMII, anxiety, depression.   Clinical Impression   Pt PTA: Pt reports being at a SNF for rehab prior. Pt was requiring assist for mobility and ADL tasks. Pt currently, with pain with movement of LLE. Pt requiring maxA for rolling and would require +2 assist for further bed mobility and possibly standing. Pt minA for UB ADL and maxA to [place] for LB ADL. Pt reports SOB during exertion, but O2 remained >90% on 3L O2. Pt limited with ability to properly care for self and to assist with mobility at this time. Pt reports increased pain with any movement. Pt would benefit from continued OT skilled services. OT following acutely.     Recommendations for follow up therapy are one component of a multi-disciplinary discharge planning process, led by the attending physician.  Recommendations may be updated based on patient status, additional functional criteria and insurance authorization.   Follow Up Recommendations  Skilled nursing-short term rehab (<3 hours/day)     Assistance Recommended at Discharge Frequent or constant Supervision/Assistance  Patient can return home with the following Two people to help with walking and/or transfers;A lot of help with bathing/dressing/bathroom;Assistance with cooking/housework;Assist for transportation;Help with stairs or ramp for entrance    Functional Status Assessment  Patient has had a recent decline in their functional status and demonstrates the ability to make significant improvements in function in a reasonable and predictable amount of  time.  Equipment Recommendations  None recommended by OT    Recommendations for Other Services       Precautions / Restrictions Precautions Precautions: Fall Restrictions Weight Bearing Restrictions: Yes LLE Weight Bearing: Weight bearing as tolerated Other Position/Activity Restrictions: WBAT in CAM boot      Mobility Bed Mobility Overal bed mobility: Needs Assistance Bed Mobility: Rolling Rolling: Max assist         General bed mobility comments: Pt with use of bedrails to roll side to side with maxA mostly for LB.    Transfers Overall transfer level: Needs assistance                 General transfer comment: DNT requires +2 assist      Balance Overall balance assessment: Needs assistance                                         ADL either performed or assessed with clinical judgement   ADL                                               Vision Baseline Vision/History: 0 No visual deficits Ability to See in Adequate Light: 0 Adequate Patient Visual Report: No change from baseline Vision Assessment?: No apparent visual deficits     Perception     Praxis      Pertinent Vitals/Pain Pain Assessment Pain Assessment: Faces Faces Pain Scale: Hurts  even more Pain Location: buttocks Pain Descriptors / Indicators: Sore     Hand Dominance Right   Extremity/Trunk Assessment Upper Extremity Assessment Upper Extremity Assessment: Generalized weakness   Lower Extremity Assessment Lower Extremity Assessment: Generalized weakness   Cervical / Trunk Assessment Cervical / Trunk Assessment: Other exceptions Cervical / Trunk Exceptions: morbid obesity   Communication Communication Communication: No difficulties   Cognition Arousal/Alertness: Awake/alert Behavior During Therapy: WFL for tasks assessed/performed Overall Cognitive Status: Within Functional Limits for tasks assessed                                        General Comments  3L O2  >90%    Exercises     Shoulder Instructions      Home Living Family/patient expects to be discharged to:: Skilled nursing facility                                 Additional Comments: Resident of [facility] SNF for rehab      Prior Functioning/Environment Prior Level of Function : Needs assist             Mobility Comments: pt reports ambulating very short distances with support of SPC at baseline ADLs Comments: assistance for ADLs from staff        OT Problem List: Decreased strength;Decreased activity tolerance;Decreased range of motion;Impaired balance (sitting and/or standing);Decreased coordination;Decreased safety awareness;Decreased knowledge of use of DME or AE;Obesity;Pain;Increased edema      OT Treatment/Interventions: Self-care/ADL training;Therapeutic exercise;DME and/or AE instruction;Energy conservation;Therapeutic activities;Patient/family education;Balance training    OT Goals(Current goals can be found in the care plan section) Acute Rehab OT Goals Patient Stated Goal: to go to SNF for rehab OT Goal Formulation: With patient/family Time For Goal Achievement: 06/07/22 Potential to Achieve Goals: Good ADL Goals Pt Will Perform Grooming: with set-up;sitting;bed level Pt/caregiver will Perform Home Exercise Program: Increased strength;Both right and left upper extremity;With theraband;With written HEP provided Additional ADL Goal #1: Pt will perform bed mobility with modA +2 as precursor for mobility. Additional ADL Goal #2: Pt will tolerate standing x1 minute with use of least restrictive AD and O2 sats >90%.  OT Frequency: Min 2X/week    Co-evaluation              AM-PAC OT "6 Clicks" Daily Activity     Outcome Measure Help from another person eating meals?: None Help from another person taking care of personal grooming?: None Help from another person toileting, which includes using  toliet, bedpan, or urinal?: A Lot Help from another person bathing (including washing, rinsing, drying)?: A Lot Help from another person to put on and taking off regular upper body clothing?: A Little Help from another person to put on and taking off regular lower body clothing?: A Lot 6 Click Score: 17   End of Session Equipment Utilized During Treatment: Oxygen Nurse Communication: Mobility status;Other (comment) (Pt's family does not want pt returning to SNF so OTR asked RN to message CM to relay info to them)  Activity Tolerance: Patient limited by pain Patient left: in bed;with call bell/phone within reach;with family/visitor present  OT Visit Diagnosis: Unsteadiness on feet (R26.81);Muscle weakness (generalized) (M62.81);Pain Pain - Right/Left: Left Pain - part of body: Leg                Time:  5072-2575 OT Time Calculation (min): 14 min Charges:  OT General Charges $OT Visit: 1 Visit OT Evaluation $OT Eval Low Complexity: 1 Low  Jefferey Pica, OTR/L Acute Rehabilitation Services Office: [place] 05/24/2022, 3:09 PM

## 2022-05-25 ENCOUNTER — Observation Stay (HOSPITAL_COMMUNITY): Payer: Medicare HMO

## 2022-05-25 DIAGNOSIS — J9621 Acute and chronic respiratory failure with hypoxia: Secondary | ICD-10-CM | POA: Diagnosis not present

## 2022-05-25 DIAGNOSIS — J101 Influenza due to other identified influenza virus with other respiratory manifestations: Secondary | ICD-10-CM | POA: Diagnosis not present

## 2022-05-25 DIAGNOSIS — F17210 Nicotine dependence, cigarettes, uncomplicated: Secondary | ICD-10-CM | POA: Diagnosis not present

## 2022-05-25 LAB — BASIC METABOLIC PANEL
Anion gap: 9 (ref 5–15)
BUN: 31 mg/dL — ABNORMAL HIGH (ref 8–23)
CO2: 30 mmol/L (ref 22–32)
Calcium: 8.2 mg/dL — ABNORMAL LOW (ref 8.9–10.3)
Chloride: 98 mmol/L (ref 98–111)
Creatinine, Ser: 1.27 mg/dL — ABNORMAL HIGH (ref 0.44–1.00)
GFR, Estimated: 45 mL/min — ABNORMAL LOW (ref >60)
Glucose, Bld: 147 mg/dL — ABNORMAL HIGH (ref 70–99)
Potassium: 4.6 mmol/L (ref 3.5–5.1)
Sodium: 137 mmol/L (ref 135–145)

## 2022-05-25 LAB — GLUCOSE, CAPILLARY
Glucose-Capillary: 143 mg/dL — ABNORMAL HIGH (ref 70–99)
Glucose-Capillary: 148 mg/dL — ABNORMAL HIGH (ref 70–99)
Glucose-Capillary: 234 mg/dL — ABNORMAL HIGH (ref 70–99)

## 2022-05-25 LAB — CBC
HCT: 30.7 % — ABNORMAL LOW (ref 36.0–46.0)
Hemoglobin: 9.1 g/dL — ABNORMAL LOW (ref 12.0–15.0)
MCH: 29.4 pg (ref 26.0–34.0)
MCHC: 29.6 g/dL — ABNORMAL LOW (ref 30.0–36.0)
MCV: 99 fL (ref 80.0–100.0)
Platelets: 222 10*3/uL (ref 150–400)
RBC: 3.1 MIL/uL — ABNORMAL LOW (ref 3.87–5.11)
RDW: 15 % (ref 11.5–15.5)
WBC: 7.3 10*3/uL (ref 4.0–10.5)
nRBC: 0 % (ref 0.0–0.2)

## 2022-05-25 MED ORDER — FUROSEMIDE 10 MG/ML IJ SOLN
40.0000 mg | Freq: Once | INTRAMUSCULAR | Status: AC
  Administered 2022-05-25: 40 mg via INTRAVENOUS
  Filled 2022-05-25: qty 4

## 2022-05-25 NOTE — Progress Notes (Signed)
Pt HR dropped in the 40's a few times morning last one dropped at about 1106. EKG was done and Nurse notified Johny Blamer, DO he came  to the unit to assess pt no new given at this time.

## 2022-05-25 NOTE — Progress Notes (Signed)
Patient complain of shortness of breath at rest. Denies any chest pain. Patient stated "I can't breath". Vital signs taken.  Dr. Posey Pronto informed. Respiratory therapist notified. PRN duoneb given. Awaiting for portable chest xray. Care ongoing.  05/25/22 0708  Vitals  Temp 98.5 F (36.9 C)  Temp Source Oral  BP (!) 102/50  MAP (mmHg) 65  BP Location Left Arm  BP Method Automatic  Patient Position (if appropriate) Lying  Pulse Rate 71  Pulse Rate Source Monitor  Resp (!) 21  MEWS COLOR  MEWS Score Color Green  Oxygen Therapy  SpO2 92 %  O2 Device Nasal Cannula  O2 Flow Rate (L/min) 4 L/min  MEWS Score  MEWS Temp 0  MEWS Systolic 0  MEWS Pulse 0  MEWS RR 1  MEWS LOC 0  MEWS Score 1

## 2022-05-25 NOTE — Progress Notes (Signed)
Pt is still complaining of SOB after her breathing tx. O2 is 100% at 4L RR 21-24. Expiratory wheezes bilaterally. MD Cain Sieve is notified.

## 2022-05-25 NOTE — Plan of Care (Signed)

## 2022-05-25 NOTE — Progress Notes (Signed)
Placed patient on CPAP for the night via auto-mode with oxygen set at 4lpm.  

## 2022-05-25 NOTE — Progress Notes (Addendum)
HD#0 Subjective:   Summary: Glenda Boone is a 71 y.o. female with a history of COPD, HFpEF, OSA, CKD stage IIIb, atrial fibrillation, HTN, T2DM, hypothyroidism, anxiety and depression who presented with 2 weeks of dry cough and 1 day of dyspnea complicated by fall with left lower extremity injury 1 day PTA. She is admitted for acute on chronic hypoxemic respiratory failure.   Overnight Events: None   Pt had some increased dyspnea this morning that was worse with lying flat but not relieved by sitting up. No other new symptoms. She does attribute some of this to anxiety but also feels like she has gotten more swelling overall since her prior discharge. She has not been on daily or prn loop diuretics outpatient before. She is also not sleeping well and is willing to try CPAP here now.  Objective:  Vital signs in last 24 hours: Vitals:   05/24/22 0923 05/24/22 1942 05/24/22 2323 05/25/22 0405  BP:  (!) 144/70  (!) 141/61  Pulse:  68  (!) 59  Resp:  (!) 22  (!) 22  Temp:  98.2 F (36.8 C)  98.4 F (36.9 C)  TempSrc:  Oral  Oral  SpO2: 98% 92% 94% 96%  Weight:      Height:       Supplemental O2: Nasal Cannula SpO2: 96 % O2 Flow Rate (L/min): 4 L/min FiO2 (%): 50 %   Physical Exam:  Constitutional: Morbidly obese and chronically ill-appearing elderly female. In no acute distress. Cardio:Regular rate and rhythm.  Pulm: Decreased air movement throughout lung fields with expiratory wheezes diffusely and bibasilar crackles.  MSK: Cam boot over the left lower extremity, 1-2+ pitting edema in the lower extremities bilaterally to the knee Skin:Warm and dry.  Venous stasis dermatitis changes over bilateral lower extremities distally. Neuro:Alert and oriented x3. No focal deficit noted.  Filed Weights   05/23/22 1019 05/23/22 2000  Weight: (!) 160.1 kg (!) 164.6 kg     Intake/Output Summary (Last 24 hours) at 05/25/2022 0701 Last data filed at 05/24/2022 1034 Gross per 24 hour   Intake 240 ml  Output --  Net 240 ml   Net IO Since Admission: 240 mL [05/25/22 0701]  Pertinent Labs:    Latest Ref Rng & Units 05/25/2022    2:38 AM 05/24/2022    2:52 AM 05/23/2022   10:44 AM  CBC  WBC 4.0 - 10.5 K/uL 7.3  6.3  7.3   Hemoglobin 12.0 - 15.0 g/dL 9.1  10.9  10.5   Hematocrit 36.0 - 46.0 % 30.7  36.2  34.1   Platelets 150 - 400 K/uL 222  213  228        Latest Ref Rng & Units 05/25/2022    2:38 AM 05/24/2022    2:52 AM 05/23/2022   10:44 AM  CMP  Glucose 70 - 99 mg/dL 147  158  125   BUN 8 - 23 mg/dL '31  21  20   '$ Creatinine 0.44 - 1.00 mg/dL 1.27  1.39  1.30   Sodium 135 - 145 mmol/L 137  139  137   Potassium 3.5 - 5.1 mmol/L 4.6  5.2  4.6   Chloride 98 - 111 mmol/L 98  104  102   CO2 22 - 32 mmol/L '30  25  23   '$ Calcium 8.9 - 10.3 mg/dL 8.2  8.4  8.2   Total Protein 6.5 - 8.1 g/dL   6.8   Total Bilirubin 0.3 -  1.2 mg/dL   0.3   Alkaline Phos 38 - 126 U/L   52   AST 15 - 41 U/L   35   ALT 0 - 44 U/L   20     Imaging: No results found.  Assessment/Plan:   Principal Problem:   Acute on chronic hypoxic respiratory failure (HCC) Active Problems:   Closed fracture of left ankle   Influenza A   Patient Summary: Glenda Boone is a 71 y.o. female with a history of COPD, HFpEF, OSA, CKD stage IIIb, atrial fibrillation, HTN, T2DM, hypothyroidism, anxiety and depression who presented with 2 weeks of dry cough and 1 day of dyspnea complicated by fall with left lower extremity injury 1 day PTA. She is admitted for acute on chronic hypoxemic respiratory failure.    Acute on chronic hypoxemic respiratory failure Influenza A COPD She has had some increased subjective dyspnea without drop in O2 sats or increase in oxygen requirements, remained on 4 L. Improved some after duoneb and more after lasix, now on 2L. Dyspnea mostly related to increased fluid with HFpEF on stable respiratory infection on COPD. Anxiety may be also exacerbating. - Continue Tamiflu  for 5 days (day 3) and supplemental oxygen as needed - Prednisone 50 mg daily for 4 days after methylprednisolone 125 mg IV day 1, day 3/5 total - Azithromycin 500 mg daily for 3 days, Day 3 - DuoNebs every 6 hours prn - Breo Ellipta and Incruse Ellipta daily - PT/OT evaluation with overall goal of ability to transfer from bed to chair/wheelchair without significant desaturation on 4 L or less.  HFpEF Pt is having increased dyspnea this morning with chest xray showing likely pulmonary edema which could be due to current infection or fluid overload. She is not on a loop diuretic at home. Exam today is largely unchanged from yesterday but she does continue to have lower extremity edema and crackles on lung exam. Pt improved after lasix and was down to 2L o2.  - continue farxiga 5 mg  - lasix 40 mg iv  - strict ins and outs - notify if increased o2 needs or sats consistently below 90   OSA Does not use CPAP at home and refused on admission. Willing to try now with nasal pillows.  - CPAP at night  A-fib HTN On tele patient has had brief sinus tachycardia and sinus bradycardia without symptoms. Rates vary from 50-120 mostly with a majority in NSR. Unknown if related to certain activities including brief sleep or exertion. We will continue to monitor telemetry.  - Continue amlodipine 2.5 mg, Eliquis 5 mg twice daily - hold spironolactone   Oblique nondisplaced fracture of the distal left fibula Orthopedics saw her and placed her in a cam boot with plans for outpatient follow-up.  Weightbearing as tolerated.  Will work with PT and OT.   CKD stage IIIb T2DM A1c on 03/28/2022 was 7.3 and patient is doing well on Farxiga 5 mg daily.  Renal function appears to be at baseline with GFR of 40-45. - Continue Farxiga 5 mg and continue to monitor blood sugars and renal function   Hypothyroidism TSH in 03/2022 was 10.2 and patient was started on levothyroxine 175 mcg daily. - Continue levothyroxine  175 mcg daily   Anxiety/Depression  - Continue home Wellbutrin and hold topiramate   Diet: Carb-Modified IVF: None, VTE: DOAC Code: DNR PT/OT recs: SNF for Subacute PT, . Again attempted to call sister without answer.   Dispo: Anticipated discharge  to Skilled nursing facility in 1-3 days pending further improvement in respiratory status.   Johny Blamer, DO Internal Medicine Resident PGY-1 Pager: [phone]  Please contact the on call pager after 5 pm and on weekends at [phone].

## 2022-05-26 DIAGNOSIS — E039 Hypothyroidism, unspecified: Secondary | ICD-10-CM | POA: Diagnosis present

## 2022-05-26 DIAGNOSIS — J101 Influenza due to other identified influenza virus with other respiratory manifestations: Secondary | ICD-10-CM | POA: Diagnosis not present

## 2022-05-26 DIAGNOSIS — I5030 Unspecified diastolic (congestive) heart failure: Secondary | ICD-10-CM | POA: Diagnosis not present

## 2022-05-26 DIAGNOSIS — J441 Chronic obstructive pulmonary disease with (acute) exacerbation: Secondary | ICD-10-CM | POA: Diagnosis present

## 2022-05-26 DIAGNOSIS — W1830XA Fall on same level, unspecified, initial encounter: Secondary | ICD-10-CM | POA: Diagnosis present

## 2022-05-26 DIAGNOSIS — I4891 Unspecified atrial fibrillation: Secondary | ICD-10-CM | POA: Diagnosis present

## 2022-05-26 DIAGNOSIS — E1122 Type 2 diabetes mellitus with diabetic chronic kidney disease: Secondary | ICD-10-CM | POA: Diagnosis present

## 2022-05-26 DIAGNOSIS — Z66 Do not resuscitate: Secondary | ICD-10-CM | POA: Diagnosis present

## 2022-05-26 DIAGNOSIS — E785 Hyperlipidemia, unspecified: Secondary | ICD-10-CM | POA: Diagnosis present

## 2022-05-26 DIAGNOSIS — J9601 Acute respiratory failure with hypoxia: Secondary | ICD-10-CM | POA: Diagnosis not present

## 2022-05-26 DIAGNOSIS — R0602 Shortness of breath: Secondary | ICD-10-CM | POA: Diagnosis present

## 2022-05-26 DIAGNOSIS — Z9981 Dependence on supplemental oxygen: Secondary | ICD-10-CM | POA: Diagnosis not present

## 2022-05-26 DIAGNOSIS — J1000 Influenza due to other identified influenza virus with unspecified type of pneumonia: Secondary | ICD-10-CM | POA: Diagnosis present

## 2022-05-26 DIAGNOSIS — Z6841 Body Mass Index (BMI) 40.0 and over, adult: Secondary | ICD-10-CM | POA: Diagnosis not present

## 2022-05-26 DIAGNOSIS — G4733 Obstructive sleep apnea (adult) (pediatric): Secondary | ICD-10-CM | POA: Diagnosis present

## 2022-05-26 DIAGNOSIS — I130 Hypertensive heart and chronic kidney disease with heart failure and stage 1 through stage 4 chronic kidney disease, or unspecified chronic kidney disease: Secondary | ICD-10-CM | POA: Diagnosis present

## 2022-05-26 DIAGNOSIS — J440 Chronic obstructive pulmonary disease with acute lower respiratory infection: Secondary | ICD-10-CM | POA: Diagnosis present

## 2022-05-26 DIAGNOSIS — Z1152 Encounter for screening for COVID-19: Secondary | ICD-10-CM | POA: Diagnosis not present

## 2022-05-26 DIAGNOSIS — J9621 Acute and chronic respiratory failure with hypoxia: Secondary | ICD-10-CM | POA: Diagnosis not present

## 2022-05-26 DIAGNOSIS — S82892A Other fracture of left lower leg, initial encounter for closed fracture: Secondary | ICD-10-CM | POA: Diagnosis not present

## 2022-05-26 DIAGNOSIS — I5033 Acute on chronic diastolic (congestive) heart failure: Secondary | ICD-10-CM | POA: Diagnosis not present

## 2022-05-26 DIAGNOSIS — F419 Anxiety disorder, unspecified: Secondary | ICD-10-CM | POA: Diagnosis present

## 2022-05-26 DIAGNOSIS — F17210 Nicotine dependence, cigarettes, uncomplicated: Secondary | ICD-10-CM | POA: Diagnosis not present

## 2022-05-26 DIAGNOSIS — S82832A Other fracture of upper and lower end of left fibula, initial encounter for closed fracture: Secondary | ICD-10-CM | POA: Diagnosis present

## 2022-05-26 DIAGNOSIS — N1832 Chronic kidney disease, stage 3b: Secondary | ICD-10-CM | POA: Diagnosis present

## 2022-05-26 DIAGNOSIS — Z79899 Other long term (current) drug therapy: Secondary | ICD-10-CM | POA: Diagnosis not present

## 2022-05-26 DIAGNOSIS — F32A Depression, unspecified: Secondary | ICD-10-CM | POA: Diagnosis present

## 2022-05-26 DIAGNOSIS — J449 Chronic obstructive pulmonary disease, unspecified: Secondary | ICD-10-CM | POA: Diagnosis not present

## 2022-05-26 DIAGNOSIS — J111 Influenza due to unidentified influenza virus with other respiratory manifestations: Secondary | ICD-10-CM | POA: Diagnosis not present

## 2022-05-26 DIAGNOSIS — Z79890 Hormone replacement therapy (postmenopausal): Secondary | ICD-10-CM | POA: Diagnosis not present

## 2022-05-26 DIAGNOSIS — Z7901 Long term (current) use of anticoagulants: Secondary | ICD-10-CM | POA: Diagnosis not present

## 2022-05-26 LAB — BASIC METABOLIC PANEL
Anion gap: 9 (ref 5–15)
BUN: 35 mg/dL — ABNORMAL HIGH (ref 8–23)
CO2: 32 mmol/L (ref 22–32)
Calcium: 8.3 mg/dL — ABNORMAL LOW (ref 8.9–10.3)
Chloride: 96 mmol/L — ABNORMAL LOW (ref 98–111)
Creatinine, Ser: 1.41 mg/dL — ABNORMAL HIGH (ref 0.44–1.00)
GFR, Estimated: 40 mL/min — ABNORMAL LOW (ref >60)
Glucose, Bld: 126 mg/dL — ABNORMAL HIGH (ref 70–99)
Potassium: 4.3 mmol/L (ref 3.5–5.1)
Sodium: 137 mmol/L (ref 135–145)

## 2022-05-26 LAB — CBC
HCT: 31.6 % — ABNORMAL LOW (ref 36.0–46.0)
Hemoglobin: 9.5 g/dL — ABNORMAL LOW (ref 12.0–15.0)
MCH: 29.1 pg (ref 26.0–34.0)
MCHC: 30.1 g/dL (ref 30.0–36.0)
MCV: 96.9 fL (ref 80.0–100.0)
Platelets: 232 10*3/uL (ref 150–400)
RBC: 3.26 MIL/uL — ABNORMAL LOW (ref 3.87–5.11)
RDW: 14.7 % (ref 11.5–15.5)
WBC: 7.7 10*3/uL (ref 4.0–10.5)
nRBC: 0 % (ref 0.0–0.2)

## 2022-05-26 LAB — GLUCOSE, CAPILLARY
Glucose-Capillary: 167 mg/dL — ABNORMAL HIGH (ref 70–99)
Glucose-Capillary: 181 mg/dL — ABNORMAL HIGH (ref 70–99)
Glucose-Capillary: 259 mg/dL — ABNORMAL HIGH (ref 70–99)
Glucose-Capillary: 180 mg/dL — ABNORMAL HIGH (ref 70–99)

## 2022-05-26 MED ORDER — LEVOTHYROXINE SODIUM 100 MCG PO TABS
200.0000 ug | ORAL_TABLET | Freq: Every day | ORAL | Status: DC
  Administered 2022-05-27 – 2022-05-30 (×4): 200 ug via ORAL
  Filled 2022-05-26 (×4): qty 2

## 2022-05-26 MED ORDER — AMLODIPINE BESYLATE 2.5 MG PO TABS
2.5000 mg | ORAL_TABLET | Freq: Every day | ORAL | Status: DC
  Administered 2022-05-27 – 2022-05-30 (×4): 2.5 mg via ORAL
  Filled 2022-05-26 (×4): qty 1

## 2022-05-26 MED ORDER — POLYETHYLENE GLYCOL 3350 17 G PO PACK
17.0000 g | PACK | Freq: Every day | ORAL | Status: DC
  Administered 2022-05-26 – 2022-05-27 (×2): 17 g via ORAL
  Filled 2022-05-26 (×2): qty 1

## 2022-05-26 MED ORDER — OSELTAMIVIR PHOSPHATE 30 MG PO CAPS
30.0000 mg | ORAL_CAPSULE | Freq: Two times a day (BID) | ORAL | Status: AC
  Administered 2022-05-26 – 2022-05-28 (×4): 30 mg via ORAL
  Filled 2022-05-26 (×4): qty 1

## 2022-05-26 MED ORDER — FUROSEMIDE 10 MG/ML IJ SOLN
40.0000 mg | Freq: Once | INTRAMUSCULAR | Status: AC
  Administered 2022-05-26: 40 mg via INTRAVENOUS
  Filled 2022-05-26: qty 4

## 2022-05-26 MED ORDER — SENNOSIDES-DOCUSATE SODIUM 8.6-50 MG PO TABS
1.0000 | ORAL_TABLET | Freq: Every day | ORAL | Status: DC
  Administered 2022-05-26: 1 via ORAL
  Filled 2022-05-26: qty 1

## 2022-05-26 NOTE — Plan of Care (Signed)
  Problem: Education: Goal: Knowledge of General Education information will improve Description: Including pain rating scale, medication(s)/side effects and non-pharmacologic comfort measures Outcome: Progressing   Problem: Coping: Goal: Level of anxiety will decrease Outcome: Progressing   Problem: Safety: Goal: Ability to remain free from injury will improve Outcome: Progressing   

## 2022-05-26 NOTE — Progress Notes (Addendum)
HD#0 Subjective:   Summary: Glenda ARSENEAU is a 72 y.o. female with a history of COPD, HFpEF, OSA, CKD stage IIIb, atrial fibrillation, HTN, T2DM, hypothyroidism, anxiety and depression who presented with 2 weeks of dry cough and 1 day of dyspnea complicated by fall with left lower extremity injury 1 day PTA. She is admitted for acute on chronic hypoxemic respiratory failure.    Overnight Events: None  Patient is doing well this morning and states that her shortness of breath yesterday has improved a lot.  She also slept well when using CPAP and she would be interested in using this going forward.  She is also okay to go back to [place] while she works on trying to find a place with her sister.  Objective:  Vital signs in last 24 hours: Vitals:   05/25/22 1120 05/25/22 1921 05/25/22 2245 05/26/22 0652  BP:  (!) 145/61  (!) 115/95  Pulse:  68 75 63  Resp:  18 19 (!) 24  Temp:  98 F (36.7 C)  98 F (36.7 C)  TempSrc:  Oral  Oral  SpO2: 95% 92% 95% 94%  Weight:      Height:       Supplemental O2: Nasal Cannula SpO2: 94 % O2 Flow Rate (L/min): 3 L/min FiO2 (%): 50 %   Physical Exam:  Constitutional: Morbidly obese and chronically ill-appearing elderly female. In no acute distress. Cardio:Regular rate and rhythm.  Pulm: Decreased air movement throughout lung fields with expiratory wheezes diffusely and bibasilar crackles.  MSK: Cam boot over the left lower extremity, 1+ pitting edema in the lower extremities bilaterally to the knee Neuro:Alert and oriented x3. No focal deficit noted.  Filed Weights   05/23/22 1019 05/23/22 2000  Weight: (!) 160.1 kg (!) 164.6 kg     Intake/Output Summary (Last 24 hours) at 05/26/2022 0834 Last data filed at 05/26/2022 0600 Gross per 24 hour  Intake 480 ml  Output 3100 ml  Net -2620 ml   Net IO Since Admission: -1,420 mL [05/26/22 0834]  Pertinent Labs:    Latest Ref Rng & Units 05/26/2022    2:53 AM 05/25/2022    2:38 AM  05/24/2022    2:52 AM  CBC  WBC 4.0 - 10.5 K/uL 7.7  7.3  6.3   Hemoglobin 12.0 - 15.0 g/dL 9.5  9.1  10.9   Hematocrit 36.0 - 46.0 % 31.6  30.7  36.2   Platelets 150 - 400 K/uL 232  222  213        Latest Ref Rng & Units 05/26/2022    2:53 AM 05/25/2022    2:38 AM 05/24/2022    2:52 AM  CMP  Glucose 70 - 99 mg/dL 126  147  158   BUN 8 - 23 mg/dL 35  31  21   Creatinine 0.44 - 1.00 mg/dL 1.41  1.27  1.39   Sodium 135 - 145 mmol/L 137  137  139   Potassium 3.5 - 5.1 mmol/L 4.3  4.6  5.2   Chloride 98 - 111 mmol/L 96  98  104   CO2 22 - 32 mmol/L 32  30  25   Calcium 8.9 - 10.3 mg/dL 8.3  8.2  8.4     Imaging: No results found.  Assessment/Plan:   Principal Problem:   Acute on chronic hypoxic respiratory failure (HCC) Active Problems:   Closed fracture of left ankle   Influenza A   Patient Summary: Glenda Boone is a 72 y.o. female with a history of COPD, HFpEF, OSA, CKD stage IIIb, atrial fibrillation, HTN, T2DM, hypothyroidism, anxiety and depression who presented with 2 weeks of dry cough and 1 day of dyspnea complicated by fall with left lower extremity injury 1 day PTA. She is admitted for acute on chronic hypoxemic respiratory failure.     Acute on chronic hypoxemic respiratory failure Influenza A COPD Doing well on her home 2L.  Recent dyspnea secondary to increased fluid with HFpEF on stable respiratory infection and COPD. Anxiety may be also exacerbating.  She is responded very well to Lasix.  We will continue as below. - Continue Tamiflu for 5 days (day 4) and supplemental oxygen as needed - Prednisone 50 mg daily for 4 days after methylprednisolone 125 mg IV day 1, day 4/5 total - Azithromycin 500 mg daily for 3 days, completed 05/25/2022 - DuoNebs every 6 hours prn - Breo Ellipta and Incruse Ellipta daily - PT/OT evaluation with overall goal of ability to transfer from bed to chair/wheelchair without significant desaturation on 4 L or less.    HFpEF Patient is doing much better than yesterday symptom wise after getting Lasix. Exam today is largely unchanged from yesterday.  She is doing well on 2 L nasal cannula and we will give her another dose of Lasix today.  Overall plan to titrate to oral dose for discharge.  She did have a slight bump in her creatinine to 1.41 which is likely due to the Lasix dose but we will monitor as we continue diuresis. - continue farxiga 5 mg  - lasix 40 mg iv  - strict ins and outs - notify if increased o2 needs or sats consistently below 90    OSA Will need CPAP order on discharge. - CPAP at night   A-fib HTN In NSR. Monitoring pressures. Slightly high and we will plan to add home therapies as we get closer to euvolemia. - Continue amlodipine 2.5 mg daily, Eliquis 5 mg twice daily - hold spironolactone    Oblique nondisplaced fracture of the distal left fibula Orthopedics saw her on admission and placed her in a cam boot with plans for outpatient follow-up.  Weightbearing as tolerated.  Continue with PT and OT.   CKD stage IIIb T2DM A1c on 03/28/2022 was 7.3 and patient is doing well on Farxiga 5 mg daily.  Renal function appears to be at baseline with GFR of 40-45. - Continue Farxiga 5 mg and continue to monitor blood sugars and renal function   Hypothyroidism TSH in 03/2022 was 10.2 and patient was started on levothyroxine 175 mcg daily, pharmacy clarified her dose as 200 daily - levothyroxine 200 mcg daily   Anxiety/Depression  - Continue home Wellbutrin and hold topiramate   Diet: Carb-Modified IVF: None, VTE: DOAC Code: DNR PT/OT recs: SNF for Subacute PT, . TOC recs: Working on discharge to SNF, likely back to [place] with plan to look at other options later. Family Update: Again tried to contact her sister but went straight to voicemail twice.   Dispo: Anticipated discharge to Skilled nursing facility in 1-3 days pending continued improvement and confirmation with  [facility] that she still has a bed.   Johny Blamer, DO Internal Medicine Resident PGY-1 Pager: [phone]  Please contact the on call pager after 5 pm and on weekends at [phone].

## 2022-05-26 NOTE — NC FL2 (Signed)
[place] LEVEL OF CARE FORM     IDENTIFICATION  Patient Name: Glenda Boone Birthdate: [birth year] Sex: female Admission Date (Current Location): 05/23/2022  [place] and [place] Number:  Kathleen Argue [id number] [place] and Address:  [facility] [place] [address] [place], [place] [address]      Provider Number: [id number]  Attending Physician Name and Address:  Lottie Mussel, MD  Relative Name and Phone Number:  HEFLIN,JENNIFER Sister   [phone]    Current Level of Care: Hospital Recommended Level of Care: [place] Prior Approval Number:    Date Approved/Denied:   PASRR Number: [id number]  Discharge Plan: SNF    Current Diagnoses: Patient Active Problem List   Diagnosis Date Noted   Closed fracture of left ankle 05/24/2022   Influenza A 05/24/2022   Acute on chronic hypoxic respiratory failure ([place]) 05/23/2022   COPD with acute exacerbation ([place]) 03/28/2022   Acute on chronic heart failure with preserved ejection fraction (HFpEF) ([place]) 03/28/2022   Acquired thrombophilia ([place]) 02/12/2020   Current use of long term anticoagulation 02/12/2020   Current every day smoker 02/12/2020   Atrial fibrillation with rapid ventricular response ([place]) 02/11/2020   Hyperthyroidism 02/11/2020   Pneumonia due to COVID-19 virus 01/31/2020   COPD (chronic obstructive pulmonary disease) ([place]) 01/31/2020   Essential hypertension 01/31/2020   Hypoxia 01/31/2020   Obesity, Class III, BMI 40-49.9 (morbid obesity) ([place]) 01/31/2020   Acute respiratory failure with hypoxia (HCC)    Type 2 diabetes mellitus with hyperlipidemia (HCC)    Hypothyroidism    Acute metabolic encephalopathy 37/90/2409   TIA (transient ischemic attack) 09/04/2019   MDD (major depressive disorder), recurrent, in full remission ([place]) 04/25/2019   Aphagia 03/09/2019   Shingles outbreak 06/21/2015   Chronic bronchitis ([place]) 06/21/2015   CAFL (chronic airflow  limitation) ([place]) 11/28/2014   H/O diabetes mellitus 11/28/2014   Anxiety, generalized 11/28/2014   H/O hypercholesterolemia 11/28/2014   H/O: HTN (hypertension) 11/28/2014   H/O: hypothyroidism 11/28/2014   Depression, major, recurrent, moderate ([place]) 11/28/2014   H/O: obesity 11/28/2014   H/O disease 11/28/2014   Neuropathy 11/28/2014    Orientation RESPIRATION BLADDER Height & Weight     Self, Time, Situation, Place  O2 Continent Weight: (!) 362 lb 14 oz (164.6 kg) Height:  '5\' 4"'$  (162.6 cm)  BEHAVIORAL SYMPTOMS/MOOD NEUROLOGICAL BOWEL NUTRITION STATUS      Continent Diet (see discharge summary)  AMBULATORY STATUS COMMUNICATION OF NEEDS Skin   Total Care Verbally Other (Comment) (redness)                       Personal Care Assistance Level of Assistance  Bathing, Feeding, Dressing, Total care Bathing Assistance: Maximum assistance Feeding assistance: Limited assistance Dressing Assistance: Maximum assistance Total Care Assistance: Maximum assistance   Functional Limitations Info  Sight, Hearing, Speech Sight Info: Adequate Hearing Info: Adequate Speech Info: Adequate    SPECIAL CARE FACTORS FREQUENCY  PT (By licensed PT), OT (By licensed OT)     PT Frequency: 5x week OT Frequency: 5x week            Contractures Contractures Info: Not present    Additional Factors Info  Code Status, Allergies, Insulin Sliding Scale Code Status Info: DNR Allergies Info: Codeine, Shellfish-derived Products, Gabapentin, Meperidine Hcl   Insulin Sliding Scale Info: Novolog: see discharge summary       Current Medications (05/26/2022):  This is the current hospital active medication list  Current Facility-Administered Medications  Medication Dose Route Frequency Provider Last Rate Last Admin   amLODipine (NORVASC) tablet 2.5 mg  2.5 mg Oral BID Johny Blamer, DO   2.5 mg at 05/26/22 1029   apixaban (ELIQUIS) tablet 5 mg  5 mg Oral BID Masters, Katie, DO   5 mg at  05/26/22 1029   atorvastatin (LIPITOR) tablet 20 mg  20 mg Oral Daily Franky Macho, RPH   20 mg at 05/26/22 1029   buPROPion ER (WELLBUTRIN SR) 12 hr tablet 100 mg  100 mg Oral Daily Masters, Katie, DO   100 mg at 05/26/22 1030   dapagliflozin propanediol (FARXIGA) tablet 5 mg  5 mg Oral Daily Masters, Katie, DO   5 mg at 05/26/22 1029   fluticasone furoate-vilanterol (BREO ELLIPTA) 100-25 MCG/ACT 1 puff  1 puff Inhalation Daily Johny Blamer, DO   1 puff at 05/26/22 0745   And   umeclidinium bromide (INCRUSE ELLIPTA) 62.5 MCG/ACT 1 puff  1 puff Inhalation Daily Johny Blamer, DO   1 puff at 05/26/22 0746   insulin aspart (novoLOG) injection 0-15 Units  0-15 Units Subcutaneous TID WC Masters, Katie, DO   3 Units at 05/26/22 0756   ipratropium-albuterol (DUONEB) 0.5-2.5 (3) MG/3ML nebulizer solution 3 mL  3 mL Nebulization Q6H PRN Lottie Mussel, MD   3 mL at 05/26/22 1028   levothyroxine (SYNTHROID) tablet 175 mcg  175 mcg Oral QAC breakfast Masters, Joellen [place], DO   175 mcg at 05/26/22 0745   oseltamivir (TAMIFLU) capsule 75 mg  75 mg Oral BID Masters, Katie, DO   75 mg at 05/26/22 1030   predniSONE (DELTASONE) tablet 50 mg  50 mg Oral Q breakfast Masters, Katie, DO   50 mg at 05/26/22 0745     Discharge Medications: Please see discharge summary for a list of discharge medications.  Relevant Imaging Results:  Relevant Lab Results:   Additional Information [phone]. Pt is vaccinated for covid with boosters.  Joanne Chars, LCSW

## 2022-05-26 NOTE — TOC Initial Note (Signed)
Transition of Care [facility]) - Initial/Assessment Note    Patient Details  Name: Glenda Boone MRN: [id number] Date of Birth: May 04, 1951  Transition [facility]) CM/SW Contact:    Joanne Chars, LCSW Phone Number: 05/26/2022, 10:37 AM  Clinical Narrative:       CSW confirmed with pt that she is LTC resident at [facility].  Discussed that PT is recommending PT at SNF currently.  Pt is reporting she is worried about returning to [place] because she fell there and thinks she may fall again.  Permission given to speak with sister Glenda Boone.  Pt is vaccinated for covid with boosters.             Per notes, [facility] is supposed to be reaching out to sister to discuss concerns.  CSW LM with sister Glenda Boone .   Expected Discharge Plan: [place] Barriers to Discharge: Continued Medical Work up   Patient Goals and CMS Choice Patient states their goals for this hospitalization and ongoing recovery are:: live a little longer          Expected Discharge Plan and Services In-house Referral: Clinical Social Work   Post Acute Care Choice: [place] Living arrangements for the past 2 months: [place] (from [place])                                      Prior Living Arrangements/Services Living arrangements for the past 2 months: [place] (from [place]) Lives with:: Facility Resident Patient language and need for interpreter reviewed:: Yes Do you feel safe going back to the place where you live?: No   pt reports she is worried about falling at [place]  Need for Family Participation in Patient Care: Yes (Comment) Care giver support system in place?: Yes (comment) Current home services: Other (comment) (na) Criminal Activity/Legal Involvement Pertinent to Current Situation/Hospitalization: No - Comment as needed  Activities of Daily Living Home Assistive Devices/Equipment: BIPAP, Raised toilet seat  with rails, Grab bars in shower, Grab bars around toilet ADL Screening (condition at time of admission) Patient's cognitive ability adequate to safely complete daily activities?: Yes Is the patient deaf or have difficulty hearing?: Yes Does the patient have difficulty seeing, even when wearing glasses/contacts?: Yes Does the patient have difficulty concentrating, remembering, or making decisions?: No Patient able to express need for assistance with ADLs?: Yes Does the patient have difficulty dressing or bathing?: Yes Independently performs ADLs?: No Communication: Independent Dressing (OT): Needs assistance Is this a change from baseline?: Pre-admission baseline Grooming: Needs assistance Is this a change from baseline?: Pre-admission baseline Feeding: Independent Bathing: Needs assistance Is this a change from baseline?: Pre-admission baseline Toileting: Needs assistance Is this a change from baseline?: Pre-admission baseline In/Out Bed: Needs assistance Is this a change from baseline?: Pre-admission baseline Walks in Home: Needs assistance Is this a change from baseline?: Pre-admission baseline Does the patient have difficulty walking or climbing stairs?: Yes Weakness of Legs: Both Weakness of Arms/Hands: Both  Permission Sought/Granted Permission sought to share information with : Family Supports Permission granted to share information with : Yes, Verbal Permission Granted  Share Information with NAME: sister Glenda Boone           Emotional Assessment Appearance:: Appears stated age Attitude/Demeanor/Rapport: Engaged Affect (typically observed): Appropriate, Pleasant Orientation: : Oriented to Self, Oriented to Place, Oriented to  Time, Oriented to Situation Alcohol /  Substance Use: Not Applicable Psych Involvement: No (comment)  Admission diagnosis:  Influenza A [J10.1] Acute respiratory failure with hypoxia (HCC) [J96.01] Closed fracture of left ankle, initial encounter  [S82.892A] Acute on chronic hypoxic respiratory failure (HCC) [J96.21] Patient Active Problem List   Diagnosis Date Noted   Closed fracture of left ankle 05/24/2022   Influenza A 05/24/2022   Acute on chronic hypoxic respiratory failure ([place]) 05/23/2022   COPD with acute exacerbation ([place]) 03/28/2022   Acute on chronic heart failure with preserved ejection fraction (HFpEF) ([place]) 03/28/2022   Acquired thrombophilia ([place]) 02/12/2020   Current use of long term anticoagulation 02/12/2020   Current every day smoker 02/12/2020   Atrial fibrillation with rapid ventricular response ([place]) 02/11/2020   Hyperthyroidism 02/11/2020   Pneumonia due to COVID-19 virus 01/31/2020   COPD (chronic obstructive pulmonary disease) ([place]) 01/31/2020   Essential hypertension 01/31/2020   Hypoxia 01/31/2020   Obesity, Class III, BMI 40-49.9 (morbid obesity) ([place]) 01/31/2020   Acute respiratory failure with hypoxia (HCC)    Type 2 diabetes mellitus with hyperlipidemia ([place])    Hypothyroidism    Acute metabolic encephalopathy 11/94/1740   TIA (transient ischemic attack) 09/04/2019   MDD (major depressive disorder), recurrent, in full remission ([place]) 04/25/2019   Aphagia 03/09/2019   Shingles outbreak 06/21/2015   Chronic bronchitis ([place]) 06/21/2015   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[place]) 11/28/2014   H/O diabetes mellitus 11/28/2014   Anxiety, generalized 11/28/2014   H/O hypercholesterolemia 11/28/2014   H/O: HTN (hypertension) 11/28/2014   H/O: hypothyroidism 11/28/2014   Depression, major, recurrent, moderate ([place]) 11/28/2014   H/O: obesity 11/28/2014   H/O disease 11/28/2014   Neuropathy 11/28/2014   PCP:  [facility]:   [place], [place] - [address] [place] [address] Phone: [phone] Fax: [phone]     Social Determinants of Health (SDOH) Social History: SDOH Screenings   Food Insecurity: No  Food Insecurity (05/23/2022)  Housing: Low Risk  (05/23/2022)  Transportation Needs: No Transportation Needs (05/23/2022)  Utilities: Not At Risk (05/23/2022)  Tobacco Use: High Risk (05/23/2022)   SDOH Interventions:     Readmission Risk Interventions    02/02/2020    5:30 PM  Readmission Risk Prevention Plan  Transportation Screening Complete  PCP or Specialist Appt within 3-5 Days Complete  HRI or [place] Complete  Social Work Consult for [place] Planning/Counseling Complete  Palliative Care Screening Not Applicable  Medication Review Press photographer) Complete

## 2022-05-26 NOTE — Progress Notes (Signed)
Per NS air mattress is not available.

## 2022-05-27 DIAGNOSIS — J441 Chronic obstructive pulmonary disease with (acute) exacerbation: Secondary | ICD-10-CM

## 2022-05-27 DIAGNOSIS — J9621 Acute and chronic respiratory failure with hypoxia: Secondary | ICD-10-CM | POA: Diagnosis not present

## 2022-05-27 DIAGNOSIS — I5033 Acute on chronic diastolic (congestive) heart failure: Secondary | ICD-10-CM | POA: Diagnosis not present

## 2022-05-27 DIAGNOSIS — J101 Influenza due to other identified influenza virus with other respiratory manifestations: Secondary | ICD-10-CM | POA: Diagnosis not present

## 2022-05-27 LAB — GLUCOSE, CAPILLARY
Glucose-Capillary: 115 mg/dL — ABNORMAL HIGH (ref 70–99)
Glucose-Capillary: 134 mg/dL — ABNORMAL HIGH (ref 70–99)
Glucose-Capillary: 223 mg/dL — ABNORMAL HIGH (ref 70–99)
Glucose-Capillary: 224 mg/dL — ABNORMAL HIGH (ref 70–99)

## 2022-05-27 LAB — BASIC METABOLIC PANEL
Anion gap: 10 (ref 5–15)
BUN: 36 mg/dL — ABNORMAL HIGH (ref 8–23)
CO2: 33 mmol/L — ABNORMAL HIGH (ref 22–32)
Calcium: 8.5 mg/dL — ABNORMAL LOW (ref 8.9–10.3)
Chloride: 94 mmol/L — ABNORMAL LOW (ref 98–111)
Creatinine, Ser: 1.32 mg/dL — ABNORMAL HIGH (ref 0.44–1.00)
GFR, Estimated: 43 mL/min — ABNORMAL LOW (ref >60)
Glucose, Bld: 107 mg/dL — ABNORMAL HIGH (ref 70–99)
Potassium: 4.2 mmol/L (ref 3.5–5.1)
Sodium: 137 mmol/L (ref 135–145)

## 2022-05-27 LAB — CBC
HCT: 32.7 % — ABNORMAL LOW (ref 36.0–46.0)
Hemoglobin: 9.8 g/dL — ABNORMAL LOW (ref 12.0–15.0)
MCH: 29 pg (ref 26.0–34.0)
MCHC: 30 g/dL (ref 30.0–36.0)
MCV: 96.7 fL (ref 80.0–100.0)
Platelets: 288 10*3/uL (ref 150–400)
RBC: 3.38 MIL/uL — ABNORMAL LOW (ref 3.87–5.11)
RDW: 14.6 % (ref 11.5–15.5)
WBC: 7.6 10*3/uL (ref 4.0–10.5)
nRBC: 0 % (ref 0.0–0.2)

## 2022-05-27 MED ORDER — SENNOSIDES-DOCUSATE SODIUM 8.6-50 MG PO TABS
1.0000 | ORAL_TABLET | Freq: Two times a day (BID) | ORAL | Status: DC
  Administered 2022-05-28: 1 via ORAL
  Filled 2022-05-27: qty 1

## 2022-05-27 MED ORDER — METOPROLOL TARTRATE 12.5 MG HALF TABLET
12.5000 mg | ORAL_TABLET | Freq: Once | ORAL | Status: AC
  Administered 2022-05-27: 12.5 mg via ORAL
  Filled 2022-05-27: qty 1

## 2022-05-27 MED ORDER — POLYETHYLENE GLYCOL 3350 17 G PO PACK
17.0000 g | PACK | Freq: Two times a day (BID) | ORAL | Status: DC
  Administered 2022-05-28: 17 g via ORAL
  Filled 2022-05-27: qty 1

## 2022-05-27 MED ORDER — ACETAMINOPHEN 500 MG PO TABS
1000.0000 mg | ORAL_TABLET | Freq: Three times a day (TID) | ORAL | Status: DC
  Administered 2022-05-27 – 2022-05-30 (×8): 1000 mg via ORAL
  Filled 2022-05-27 (×10): qty 2

## 2022-05-27 MED ORDER — BUSPIRONE HCL 10 MG PO TABS
10.0000 mg | ORAL_TABLET | Freq: Two times a day (BID) | ORAL | Status: DC
  Administered 2022-05-27 – 2022-05-30 (×7): 10 mg via ORAL
  Filled 2022-05-27 (×7): qty 1

## 2022-05-27 MED ORDER — FUROSEMIDE 10 MG/ML IJ SOLN
40.0000 mg | Freq: Once | INTRAMUSCULAR | Status: AC
  Administered 2022-05-27: 40 mg via INTRAVENOUS
  Filled 2022-05-27: qty 4

## 2022-05-27 NOTE — Progress Notes (Signed)
   05/27/22 1427  Assess: MEWS Score  BP 96/64  MAP (mmHg) 76  Pulse Rate 93  SpO2 91 %  O2 Device Nasal Cannula  O2 Flow Rate (L/min) 4 L/min  Assess: MEWS Score  MEWS Temp 0  MEWS Systolic 1  MEWS Pulse 0  MEWS RR 1  MEWS LOC 0  MEWS Score 2  MEWS Score Color Yellow  Assess: if the MEWS score is Yellow or Red  Were vital signs taken at a resting state? Yes  Focused Assessment No change from prior assessment  Does the patient meet 2 or more of the SIRS criteria? Yes  Does the patient have a confirmed or suspected source of infection? No  MEWS guidelines implemented *See Row Information* Yes  Treat  MEWS Interventions Administered scheduled meds/treatments  Take Vital Signs  Increase Vital Sign Frequency  Yellow: Q 2hr X 2 then Q 4hr X 2, if remains yellow, continue Q 4hrs  Escalate  MEWS: Escalate Yellow: discuss with charge nurse/RN and consider discussing with provider and RRT  Notify: Charge Nurse/RN  Name of Charge Nurse/RN Notified Alexis, RN  Date Charge Nurse/RN Notified 05/27/22  Time Charge Nurse/RN Notified 1300  Provider Notification  Provider Name/Title Deland Pretty, MD  Date Provider Notified 05/27/22  Time Provider Notified 1153  Method of Notification Page  Notification Reason Change in status  Provider response En route;See new orders  Date of Provider Response 05/27/22  Time of Provider Response 1155  Assess: SIRS CRITERIA  SIRS Temperature  0  SIRS Pulse 1  SIRS Respirations  1  SIRS WBC 0  SIRS Score Sum  2

## 2022-05-27 NOTE — TOC Progression Note (Addendum)
Transition of Care [facility]) - Progression Note    Patient Details  Name: Glenda Boone MRN: [id number] Date of Birth: [birth year]  Transition of Care [facility]) CM/SW Contact  Glenda Chars, LCSW Phone Number: 05/27/2022, 11:34 AM  Clinical Narrative:   CSW informed by Glenda Boone that they talked with pt sister and no concerns were report.  Another attempt made to contact sister Glenda Boone, goes straight to voicemail.    CSW spoke with pt, who continues to express fear that she will fall if she returns to [place].  Discussed that Glenda Boone have offered beds as potential alternatives, both are 1 star facilities.  Pt called sister, who did answer and told her she was at work, unable to talk until she gets off at 1530.  Pt gets anxious as we discuss this, wants sister to be involved.  CSW sent sister a text as well.  CSW spoke with Glenda Bo[facility]/intake for [facility].  She could take pt for rehab and then LTC at [facility], not [place].    Glenda Boone TC sister Glenda Boone.  Discussed concerns with return to [place] (pt worried about falls)  Glenda Boone spoke with administrator at [facility] on [place] [address] [facility] and had good conversation, Glenda Boone feeling better about this.  They had been working with Education officer, museum at [facility] about [facility] SNF placement closer to [place] in [place], so far have not found one.  Discussed one LTC SNF option at [facility], Glenda Boone less interested in new placement in [place].  Glenda Boone agreeable to plan for return to [place] and will continue to work with social work there are LTC placement options closer to sister in [place].   CSW LM with Glenda Boone regarding the above and confirming if they will do insurance auth to return as rehab pt.      Expected Discharge Plan: [place] Barriers to Discharge: Continued Medical Work up  Expected Discharge Plan and Services In-house Referral: Clinical Social Work    Post Acute Care Choice: Glenda Boone Living arrangements for the past 2 months: [place] (from [place])                                       Social Determinants of Health (Glenda Boone) Interventions [place]: No Food Insecurity (05/23/2022)  Housing: Low Risk  (05/23/2022)  Transportation Needs: No Transportation Needs (05/23/2022)  Utilities: Not At Risk (05/23/2022)  Tobacco Use: High Risk (05/23/2022)    Readmission Risk Interventions    02/02/2020    5:30 PM  Readmission Risk Prevention Plan  Transportation Screening Complete  PCP or Specialist Appt within 3-5 Days Complete  HRI or [place] Complete  Social Work Consult for [place] Planning/Counseling Complete  Palliative Care Screening Not Applicable  Medication Review Press photographer) Complete

## 2022-05-27 NOTE — Progress Notes (Signed)
HD#4 Subjective:   Summary: Glenda Boone is a 72 y.o. female with a history of COPD, HFpEF, OSA, CKD stage IIIb, atrial fibrillation, HTN, T2DM, hypothyroidism, anxiety and depression who presented with 2 weeks of dry cough and 1 day of dyspnea complicated by fall with left lower extremity injury. She is admitted for acute on chronic hypoxemic respiratory failure.    Overnight Events: None  Pt was seen this AM bedside. She is complaining of achy pain all over that has been present for a while. She has not had a bowel movement in the 5 days of being here, and miralax has not been helping her. She states she is feeling well rested, as CPAP has been helping her get better sleep. She has been afraid to ambulate given recent falls and body habitus, worried she will need much more help if she does fall and won't be able to get back up.    Objective:  Vital signs in last 24 hours: Vitals:   05/26/22 2103 05/27/22 0332 05/27/22 0430 05/27/22 0808  BP:   126/63 (!) 125/97  Pulse: 70  61 (!) 59  Resp: (!) '21 20 20 18  '$ Temp:   98 F (36.7 C) 98 F (36.7 C)  TempSrc:   Oral   SpO2: 98%  97% 93%  Weight:      Height:       Supplemental O2: Nasal Cannula SpO2: 93 % O2 Flow Rate (L/min): 4 L/min FiO2 (%): 50 %   Physical Exam:  Constitutional: Morbidly obese and chronically ill-appearing elderly female. In no acute distress. Cardio:Regular rate and rhythm.  Pulm: Decreased air movement throughout lung fields with expiratory wheezes diffusely and bibasilar crackles.  MSK: Cam boot over the left lower extremity, 1+ pitting edema in the lower extremities bilaterally to the knee Neuro:Alert and oriented x3. No focal deficit noted.  Filed Weights   05/23/22 1019 05/23/22 2000  Weight: (!) 160.1 kg (!) 164.6 kg     Intake/Output Summary (Last 24 hours) at 05/27/2022 1113 Last data filed at 05/27/2022 0515 Gross per 24 hour  Intake 480 ml  Output 2800 ml  Net -2320 ml    Net IO  Since Admission: -4,380 mL [05/27/22 1113]  Pertinent Labs:    Latest Ref Rng & Units 05/27/2022    4:10 AM 05/26/2022    2:53 AM 05/25/2022    2:38 AM  CBC  WBC 4.0 - 10.5 K/uL 7.6  7.7  7.3   Hemoglobin 12.0 - 15.0 g/dL 9.8  9.5  9.1   Hematocrit 36.0 - 46.0 % 32.7  31.6  30.7   Platelets 150 - 400 K/uL 288  232  222        Latest Ref Rng & Units 05/27/2022    4:10 AM 05/26/2022    2:53 AM 05/25/2022    2:38 AM  CMP  Glucose 70 - 99 mg/dL 107  126  147   BUN 8 - 23 mg/dL 36  35  31   Creatinine 0.44 - 1.00 mg/dL 1.32  1.41  1.27   Sodium 135 - 145 mmol/L 137  137  137   Potassium 3.5 - 5.1 mmol/L 4.2  4.3  4.6   Chloride 98 - 111 mmol/L 94  96  98   CO2 22 - 32 mmol/L 33  32  30   Calcium 8.9 - 10.3 mg/dL 8.5  8.3  8.2     Imaging: No results found.  Assessment/Plan:  Principal Problem:   Acute on chronic hypoxic respiratory failure (HCC) Active Problems:   Closed fracture of left ankle   Influenza A   Patient Summary: Glenda Boone is a 72 y.o. female with a history of COPD, HFpEF, OSA, CKD stage IIIb, atrial fibrillation, HTN, T2DM, hypothyroidism, anxiety and depression who presented with 2 weeks of dry cough and 1 day of dyspnea complicated by fall with left lower extremity injury. She is admitted for acute on chronic hypoxemic respiratory failure.     #Acute on chronic hypoxemic respiratory failure secondary to influenza infection in the setting of COPD and HFpEF  Pt will be completing tamiflu and prednisone therapy today. She was on 4L during encounter, however had normal work of breathing. Lung aucultation revealed mild wheezes and crackles through lung field. She is chronically on 2L at home. Pt also seems very anxious, which could be playing a part in acute increase in oxygen requirement. She denies any fevers, chills. At this time, increased oxygen requirement likely due to HF. She has had very good output (3L yesterday), and states her lasix has been working  well. She has not been able to ambulate yet due to fear of falling. Will need to monitor kidney function in the setting of diuresing, most creatinine is 1.32, improved from 1.41 yesterday.    Plan:  - Chiropodist - Encourage patient to ambulate with PT/OT - Continue IV Lasix '40mg'$  daily for one more dose, then switch to PO Lasix '40mg'$  tomorrow if output and breathing has improved.  - Duonebs every 6 hours prn  - Breo ellipta and Incruse Ellipta daily - Continue faxiga '5mg'$  - Strict ins and outs  - Daily BMP  #Constipation Pt states she has not had a bowel movement since admitted to the hospital. Has been given miralax and senna, with no improvement. Will increase miralax to twice a day, and senna to twice a day.     #OSA Will need CPAP order on discharge. - CPAP at night   #A-fib #HTN In NSR. Blood pressure has been stable in the setting of diuresis.   Plan: -Continue amlodipine 2.5 mg daily, Eliquis 5 mg twice daily    #Oblique nondisplaced fracture of the distal left fibula Orthopedics saw her on admission and placed her in a cam boot with plans for outpatient follow-up.  Weightbearing as tolerated.  Continue with PT and OT. Will schedule tylenol '1000mg'$  TID to help with pain.   Plan:  - Tylenol '1000mg'$  TID   #CKD stage IIIb #T2DM A1c on 03/28/2022 was 7.3 and patient is doing well on Farxiga 5 mg daily.   - Continue Farxiga 5 mg and continue to monitor blood sugars and renal function   #Hypothyroidism TSH in 03/2022 was 10.2 and patient was started on levothyroxine 175 mcg daily, pharmacy clarified her dose as 200 daily - levothyroxine 200 mcg daily   #Anxiety/Depression  Per last hospital visit in November of 2023, she was started on buspar '10mg'$  BID as well as Atarax '10mg'$  BID PRN and received adequate control of her anxiety. On exam today, pt is very anxious which could be playing a part in tachycardic episodes. Will add Buspar '10mg'$  BID, and if needed can add  atarax as well.   Plan:  - Continue Buproprion - Start Buspar '10mg'$  BID #Disposition Pt hesistent to return to [facility] facility after acute fall that led to fracture. TOC working on other options, per encounter today pt does not want to return there,  however will discuss with sister who is main POA.    Diet: Carb-Modified IVF: None, VTE: DOAC Code: DNR PT/OT recs: SNF for Subacute PT, . TOC recs: Working on discharge to SNF, likely back to [facility] with plan to look at other options later.    Dispo: Anticipated discharge to Skilled nursing facility in 1-3 days pending continued improvement and confirmation with [facility] that she still has a bed.   Drucie Opitz, MD Internal Medicine Resident PGY-1 Pager: [phone]  Please contact the on call pager after 5 pm and on weekends at [phone].

## 2022-05-27 NOTE — Progress Notes (Signed)
Occupational Therapy Treatment Patient Details Name: Glenda Boone MRN: [id number] DOB: 02-09-1951 Today's Date: 05/27/2022   History of present illness 72 y.o. female presents to [facility] hospital on 05/23/2022 after a fall with LLE injury, pt also with cough and dyspnea in days preceding fall. Pt found to be flu A positive and have an oblique nondisplaced distal L fibula fx. PMH includes COPD, CHF, OSA, CKD III, afib, HTN, DMII, anxiety, depression.   OT comments  Pt in bed upon therapy arrival stating that she is currently on the bedpan as she is unable to stop urinating. This therapist offered to assist her off the bedpan due to pain in her buttocks. Due to poor placement of bedpan and amount of urine, pt required a change in bed linens and hospital gown as well. Session focused on bed mobility and UB strength as pt assisted with right and left side rolling. Pt with a increased fear of falling and requested to have lower bed rails raised which impeded her ability to rolling completely onto her side requiring greater assistance from OT to complete task.    Recommendations for follow up therapy are one component of a multi-disciplinary discharge planning process, led by the attending physician.  Recommendations may be updated based on patient status, additional functional criteria and insurance authorization.    Follow Up Recommendations  Skilled nursing-short term rehab (<3 hours/day)     Assistance Recommended at Discharge Frequent or constant Supervision/Assistance  Patient can return home with the following  Two people to help with walking and/or transfers;A lot of help with bathing/dressing/bathroom;Assistance with cooking/housework;Assist for transportation;Help with stairs or ramp for entrance;Two people to help with bathing/dressing/bathroom   Equipment Recommendations  Other (comment) (defer to next venue of care)       Precautions / Restrictions Precautions Precautions:  Fall Restrictions Weight Bearing Restrictions: Yes LLE Weight Bearing: Weight bearing as tolerated Other Position/Activity Restrictions: WBAT in CAM boot       Mobility Bed Mobility Overal bed mobility: Needs Assistance Bed Mobility: Rolling Rolling: Mod assist (bed rails used to assist)         General bed mobility comments: pt performed rolling right and left multiple times during LB hygiene and bedding change. She preferred to have lower bed rails up due to fear of falling although railings impeded her ability to roll completing on her side and she required more physical assist to roll more than she was able to physically do. Patient Response: Anxious, Cooperative  Transfers Overall transfer level:  (not completed this date.)         ADL either performed or assessed with clinical judgement   ADL Overall ADL's : Needs assistance/impaired             Lower Body Bathing: Total assistance;Bed level               Toileting- Clothing Manipulation and Hygiene: Total assistance;Bed level                         Cognition Arousal/Alertness: Awake/alert Behavior During Therapy: WFL for tasks assessed/performed Overall Cognitive Status: Within Functional Limits for tasks assessed                General Comments HR increased to 120's-130's at times in bed when stationary as well as when performing bed mobility    Pertinent Vitals/ Pain       Pain Assessment Pain Assessment: Faces Faces Pain Scale: Hurts even  more Pain Location: buttocks and LLE during movement and bed mobility Pain Descriptors / Indicators: Grimacing, Guarding, Sore Pain Intervention(s): Monitored during session, Repositioned         Frequency  Min 2X/week        Progress Toward Goals  OT Goals(current goals can now be found in the care plan section)  Progress towards OT goals: Not progressing toward goals - comment (pt's fear of falling is stopping her from attempting to  get out of bed with therapy.)     Plan Discharge plan remains appropriate;Frequency remains appropriate       AM-PAC OT "6 Clicks" Daily Activity     Outcome Measure   Help from another person eating meals?: None Help from another person taking care of personal grooming?: None Help from another person toileting, which includes using toliet, bedpan, or urinal?: Total Help from another person bathing (including washing, rinsing, drying)?: Total Help from another person to put on and taking off regular upper body clothing?: A Lot Help from another person to put on and taking off regular lower body clothing?: Total 6 Click Score: 13    End of Session Equipment Utilized During Treatment: Oxygen  OT Visit Diagnosis: Unsteadiness on feet (R26.81);Muscle weakness (generalized) (M62.81);Pain Pain - Right/Left: Left Pain - part of body: Leg   Activity Tolerance Patient limited by pain   Patient Left in bed;with call bell/phone within reach   Nurse Communication Other (comment) (pt's order for Purewick and need to order.)        Time: 1243-1317 OT Time Calculation (min): 34 min  Charges: OT General Charges $OT Visit: 1 Visit OT Treatments $Therapeutic Activity: 23-37 mins  Ailene Ravel, OTR/L,CBIS  Supplemental OT - [facility] [employer] Secure Chat Preferred    Jennifer Payes, Clarene Duke 05/27/2022, 2:21 PM

## 2022-05-27 NOTE — Patient Instructions (Signed)

## 2022-05-28 DIAGNOSIS — J9621 Acute and chronic respiratory failure with hypoxia: Secondary | ICD-10-CM | POA: Diagnosis not present

## 2022-05-28 DIAGNOSIS — J9601 Acute respiratory failure with hypoxia: Secondary | ICD-10-CM

## 2022-05-28 DIAGNOSIS — J101 Influenza due to other identified influenza virus with other respiratory manifestations: Secondary | ICD-10-CM | POA: Diagnosis not present

## 2022-05-28 LAB — CBC
HCT: 36.6 % (ref 36.0–46.0)
Hemoglobin: 11.6 g/dL — ABNORMAL LOW (ref 12.0–15.0)
MCH: 29.8 pg (ref 26.0–34.0)
MCHC: 31.7 g/dL (ref 30.0–36.0)
MCV: 94.1 fL (ref 80.0–100.0)
Platelets: 315 10*3/uL (ref 150–400)
RBC: 3.89 MIL/uL (ref 3.87–5.11)
RDW: 14.6 % (ref 11.5–15.5)
WBC: 9.3 10*3/uL (ref 4.0–10.5)
nRBC: 0 % (ref 0.0–0.2)

## 2022-05-28 LAB — BASIC METABOLIC PANEL
Anion gap: 9 (ref 5–15)
BUN: 39 mg/dL — ABNORMAL HIGH (ref 8–23)
CO2: 32 mmol/L (ref 22–32)
Calcium: 8.4 mg/dL — ABNORMAL LOW (ref 8.9–10.3)
Chloride: 96 mmol/L — ABNORMAL LOW (ref 98–111)
Creatinine, Ser: 1.31 mg/dL — ABNORMAL HIGH (ref 0.44–1.00)
GFR, Estimated: 43 mL/min — ABNORMAL LOW (ref >60)
Glucose, Bld: 144 mg/dL — ABNORMAL HIGH (ref 70–99)
Potassium: 4 mmol/L (ref 3.5–5.1)
Sodium: 137 mmol/L (ref 135–145)

## 2022-05-28 LAB — GLUCOSE, CAPILLARY
Glucose-Capillary: 274 mg/dL — ABNORMAL HIGH (ref 70–99)
Glucose-Capillary: 186 mg/dL — ABNORMAL HIGH (ref 70–99)
Glucose-Capillary: 156 mg/dL — ABNORMAL HIGH (ref 70–99)
Glucose-Capillary: 146 mg/dL — ABNORMAL HIGH (ref 70–99)
Glucose-Capillary: 213 mg/dL — ABNORMAL HIGH (ref 70–99)

## 2022-05-28 MED ORDER — FUROSEMIDE 40 MG PO TABS
40.0000 mg | ORAL_TABLET | Freq: Every day | ORAL | Status: DC
  Administered 2022-05-28 – 2022-05-30 (×3): 40 mg via ORAL
  Filled 2022-05-28 (×3): qty 1

## 2022-05-28 MED ORDER — METOPROLOL TARTRATE 25 MG PO TABS
25.0000 mg | ORAL_TABLET | Freq: Once | ORAL | Status: AC
  Administered 2022-05-28: 25 mg via ORAL
  Filled 2022-05-28: qty 1

## 2022-05-28 NOTE — Progress Notes (Signed)
Physical Therapy Treatment Patient Details Name: Glenda Boone MRN: [id number] DOB: [birth year] Today's Date: 05/28/2022   History of Present Illness 72 y.o. female presents to [facility] hospital on 05/23/2022 after a fall with LLE injury, pt also with cough and dyspnea in days preceding fall. Pt found to be flu A positive and have an oblique nondisplaced distal L fibula fx. PMH includes COPD, CHF, OSA, CKD III, afib, HTN, DMII, anxiety, depression.    PT Comments    Pt received in supine, agreeable to therapy session with good participation and tolerance for bed mobility and lift transfer OOB to chair. Session emphasis on bed mobility, supine LE exercises for strengthening/ROM, pursed-lip breathing and activity pacing, and benefits of OOB to chair daily for skin protection and improved pulmonary clearance. Pt receptive to instruction, and is agreeable to work on EOB seated balance and transfers next session. RN/NT notified of pt tolerance for hoyer transfers, lift pad under for safe return to bed later in day. Pt encouraged to sit up 1-2 hours if possible. Pt continues to benefit from PT services to progress toward functional mobility goals.   Recommendations for follow up therapy are one component of a multi-disciplinary discharge planning process, led by the attending physician.  Recommendations may be updated based on patient status, additional functional criteria and insurance authorization.  Follow Up Recommendations  Skilled nursing-short term rehab (<3 hours/day) Can patient physically be transported by private vehicle: No   Assistance Recommended at Discharge Frequent or constant Supervision/Assistance  Patient can return home with the following Two people to help with walking and/or transfers;Two people to help with bathing/dressing/bathroom;Assistance with cooking/housework;Direct supervision/assist for medications management;Direct supervision/assist for financial management;Assist for  transportation;Help with stairs or ramp for entrance   Equipment Recommendations  None recommended by PT    Recommendations for Other Services       Precautions / Restrictions Precautions Precautions: Fall Precaution Comments: Droplet precs; fear of falls Restrictions Weight Bearing Restrictions: Yes LLE Weight Bearing: Weight bearing as tolerated Other Position/Activity Restrictions: WBAT in CAM boot     Mobility  Bed Mobility Overal bed mobility: Needs Assistance Bed Mobility: Rolling, Supine to Sit Rolling: Min assist   Supine to sit: Mod assist, HOB elevated     General bed mobility comments: pt performed rolling right and left multiple times for placement and adjustment of mechanical lift pad. from reclined chair posture, pt pulling up to long sitting in recliner with modA trunk support for adjustment of linens behind her back, but tachy/dyspneic and quick to lay back. Encouraged her to mentally prepare for EOB/OOB transfer training again next session since she tolerated hoyer lift well today, pt agreeable. Patient Response: Cooperative  Transfers Overall transfer level: Needs assistance   Transfers: Bed to chair/wheelchair/BSC             General transfer comment: NT assisting; see bed mobility, pt rolling well for placement of pads and needed +2 for safety with mechanical lift management and body mechanics; cues for pursed-lip breathing during transfer given pt increased anxiety Transfer via Lift Equipment: Maximove  Ambulation/Gait                   Stairs             Wheelchair Mobility    Modified Rankin (Stroke Patients Only)       Balance Overall balance assessment: Needs assistance Sitting-balance support: Feet supported, Single extremity supported Sitting balance-Leahy Scale: Fair Sitting balance - Comments: limited tolerance  to sit upright in bed/recliner due to fatigue/dyspnea                                     Cognition Arousal/Alertness: Awake/alert Behavior During Therapy: WFL for tasks assessed/performed, Anxious Overall Cognitive Status: Within Functional Limits for tasks assessed                                 General Comments: Pt verbalizes fear of falls before and during functional mobility tasks, initially not agreeable to attempt EOB/OOB but with encouragement, pt agreeable to try mechanical lift transfer OOB to chair. Plan to work on EOB sitting next session as pt tolerated lift well.        Exercises Other Exercises Other Exercises: supine BLE AROM: ankle pumps, heel slides, hip abduction, SAQ x10 reps ea Other Exercises: bed "crunches" x3 reps in bed/chair each using B side rails/arm rests for repositioning and core strengthening    General Comments General comments (skin integrity, edema, etc.): SpO2 WFL on 4L/min O2 , HR up to 120's-130's bpm with bed mobility and lift transfer OOB.      Pertinent Vitals/Pain Pain Assessment Pain Assessment: Faces Faces Pain Scale: Hurts a little bit Pain Location: buttocks and LLE during movement and bed mobility Pain Descriptors / Indicators: Guarding, Sore, Tender Pain Intervention(s): Monitored during session, Repositioned           PT Goals (current goals can now be found in the care plan section) Acute Rehab PT Goals Patient Stated Goal: to improve strength and mobility quality PT Goal Formulation: With patient Time For Goal Achievement: 06/07/22 Progress towards PT goals: Progressing toward goals    Frequency    Min 2X/week      PT Plan Current plan remains appropriate       AM-PAC PT "6 Clicks" Mobility   Outcome Measure  Help needed turning from your back to your side while in a flat bed without using bedrails?: A Lot Help needed moving from lying on your back to sitting on the side of a flat bed without using bedrails?: A Lot Help needed moving to and from a bed to a chair (including a  wheelchair)?: Total Help needed standing up from a chair using your arms (e.g., wheelchair or bedside chair)?: Total Help needed to walk in hospital room?: Total Help needed climbing 3-5 steps with a railing? : Total 6 Click Score: 8    End of Session Equipment Utilized During Treatment: Oxygen;Other (comment) (mechanical lift pads) Activity Tolerance: Patient tolerated treatment well;Other (comment) (HR to 130's bpm) Patient left: in chair;with call bell/phone within reach;with chair alarm set Nurse Communication: Mobility status;Need for lift equipment;Precautions;Other (comment) (bariatric recliner sitting outside her room, needs to be switched out once she is lifted back to chair, the one she is sitting in is a bit small for her.) PT Visit Diagnosis: Other abnormalities of gait and mobility (R26.89);Muscle weakness (generalized) (M62.81);History of falling (Z91.81)     Time: 1536-1600 PT Time Calculation (min) (ACUTE ONLY): 24 min  Charges:  $Therapeutic Exercise: 8-22 mins $Therapeutic Activity: 8-22 mins                     Katheen Aslin P., PTA Acute Rehabilitation Services Secure Chat Preferred 9a-5:30pm Office: [place] 05/28/2022, 6:00 PM

## 2022-05-28 NOTE — Plan of Care (Signed)
  Problem: Education: Goal: Knowledge of General Education information will improve Description: Including pain rating scale, medication(s)/side effects and non-pharmacologic comfort measures Outcome: Progressing   Problem: Health Behavior/Discharge Planning: Goal: Ability to manage health-related needs will improve Outcome: Progressing   Problem: Clinical Measurements: Goal: Ability to maintain clinical measurements within normal limits will improve Outcome: Progressing Goal: Will remain free from infection Outcome: Progressing Goal: Respiratory complications will improve Outcome: Progressing Goal: Cardiovascular complication will be avoided Outcome: Progressing   Problem: Nutrition: Goal: Adequate nutrition will be maintained Outcome: Progressing

## 2022-05-28 NOTE — Progress Notes (Signed)
HD#5 Subjective:   Summary: Glenda Boone is a 72 y.o. female with a history of COPD, HFpEF, OSA, CKD stage IIIb, atrial fibrillation, HTN, T2DM, hypothyroidism, anxiety and depression who presented with 2 weeks of dry cough and 1 day of dyspnea complicated by fall with left lower extremity injury. She is admitted for acute on chronic hypoxemic respiratory failure.    Overnight Events: None  Pt was seen this AM bedside. She has been having regular bowel movements with the increased dosages of miralax and senna, was in fact having one during interview.  Pt states she in uncomfortable in general, due to positioning and laying "crooked". She denies any chest pain, but says her back has been bothering her. She is eating well, and denies any nausea or vomiting.     Objective:  Vital signs in last 24 hours: Vitals:   05/28/22 0430 05/28/22 0500 05/28/22 0824 05/28/22 0841  BP: 125/74   (!) 133/106  Pulse:    92  Resp: 20     Temp: 98 F (36.7 C)   97.8 F (36.6 C)  TempSrc:    Oral  SpO2: 94%  95% 96%  Weight:  (!) 152.9 kg    Height:       Supplemental O2: Nasal Cannula SpO2: 96 % O2 Flow Rate (L/min): 4 L/min FiO2 (%): 50 %   Physical Exam:  Constitutional: Morbidly obese, very uncomfortable, chronically ill-appearing elderly female. In no acute distress. Cardio:Regular rate and rhythm.  Pulm: Decreased air movement throughout lung fields with expiratory wheezes and crackles MSK: Cam boot over the left lower extremity, 1+ pitting edema in the lower extremities bilaterally to the knee   Filed Weights   05/23/22 1019 05/23/22 2000 05/28/22 0500  Weight: (!) 160.1 kg (!) 164.6 kg (!) 152.9 kg     Intake/Output Summary (Last 24 hours) at 05/28/2022 1118 Last data filed at 05/28/2022 0440 Gross per 24 hour  Intake --  Output 850 ml  Net -850 ml    Net IO Since Admission: -6,330 mL [05/28/22 1118]  Pertinent Labs:    Latest Ref Rng & Units 05/28/2022    3:07 AM  05/27/2022    4:10 AM 05/26/2022    2:53 AM  CBC  WBC 4.0 - 10.5 K/uL 9.3  7.6  7.7   Hemoglobin 12.0 - 15.0 g/dL 11.6  9.8  9.5   Hematocrit 36.0 - 46.0 % 36.6  32.7  31.6   Platelets 150 - 400 K/uL 315  288  232        Latest Ref Rng & Units 05/28/2022    3:07 AM 05/27/2022    4:10 AM 05/26/2022    2:53 AM  CMP  Glucose 70 - 99 mg/dL 144  107  126   BUN 8 - 23 mg/dL 39  36  35   Creatinine 0.44 - 1.00 mg/dL 1.31  1.32  1.41   Sodium 135 - 145 mmol/L 137  137  137   Potassium 3.5 - 5.1 mmol/L 4.0  4.2  4.3   Chloride 98 - 111 mmol/L 96  94  96   CO2 22 - 32 mmol/L 32  33  32   Calcium 8.9 - 10.3 mg/dL 8.4  8.5  8.3     Imaging: No results found.  Assessment/Plan:   Principal Problem:   Acute on chronic hypoxic respiratory failure (HCC) Active Problems:   COPD exacerbation (HCC)   Acute on chronic heart failure with  preserved ejection fraction (HCC)   Closed fracture of left ankle   Influenza A   Patient Summary: Glenda Boone is a 72 y.o. female with a history of COPD, HFpEF, OSA, CKD stage IIIb, atrial fibrillation, HTN, T2DM, hypothyroidism, anxiety and depression who presented with 2 weeks of dry cough and 1 day of dyspnea complicated by fall with left lower extremity injury. She is admitted for acute on chronic hypoxemic respiratory failure.     #Acute on chronic hypoxemic respiratory failure secondary to influenza infection in the setting of COPD and HFpEF  Pt will be completed tamiflu and prednisone therapy yesterday. She was on 4L during encounter, however had normal work of breathing. Lung aucultation revealed mild wheezes and crackles through lung field. She is chronically on 2L at home. Pt also seems very anxious, which could be playing a part in acute increase in oxygen requirement. She states she is very uncomfortable laying in bed, likely due to bed pan. Will attempt to wean oxygen down to 2L baseline today and see if pt can tolerate.  She has not been able to  ambulate yet due to fear of falling. Will need to monitor kidney function in the setting of diuresing, most creatinine is 1.31, improved from 1.32 yesterday. Will switch IV Lasix '40mg'$  to PO Lasix '40mg'$  today.    Plan:  - Chiropodist - Encourage patient to ambulate with PT/OT - PO Lasix '40mg'$  daily - Duonebs every 6 hours prn  - Breo ellipta and Incruse Ellipta daily - Continue faxiga '5mg'$  - Strict ins and outs  - Daily BMP    #A-fib #HTN In NSR. Blood pressure has been stable in the setting of diuresis. Pt had episode of atrial fibrillation yesterday, with pulses ranging into the 130-150 range. She was given metoprolol 12.'5mg'$  and was back in sinus rhythm within an hour or so. Will continue to monitor pulses. Metoprolol was discontinued at last hospital encounter due to pt baseline being in the 60s and fear of bradycardia. Will continue to monitor pulses and telemetry.   Plan: -Continue amlodipine 2.5 mg daily, Eliquis 5 mg twice daily  #OSA Will need CPAP order on discharge. - CPAP at night   #Oblique nondisplaced fracture of the distal left fibula Orthopedics saw her on admission and placed her in a cam boot with plans for outpatient follow-up.  Weightbearing as tolerated.  Continue with PT and OT. Will schedule tylenol '1000mg'$  TID to help with pain.   Plan:  - Tylenol '1000mg'$  TID   #CKD stage IIIb #T2DM A1c on 03/28/2022 was 7.3 and patient is doing well on Farxiga 5 mg daily.   - Continue Farxiga 5 mg and continue to monitor blood sugars and renal function   #Hypothyroidism TSH in 03/2022 was 10.2 and patient was started on levothyroxine 175 mcg daily, pharmacy clarified her dose as 200 daily - levothyroxine 200 mcg daily   #Anxiety/Depression  Per last hospital visit in November of 2023, she was started on buspar '10mg'$  BID as well as Atarax '10mg'$  BID PRN and received adequate control of her anxiety. On exam today, pt is very anxious which could be playing a part in  tachycardic episodes. Will continue with buspar and bupropion  Plan:  - Continue Buproprion - Start Buspar '10mg'$  BID  #Disposition Pt hesistent to return to [facility] facility after acute fall that led to fracture. After discussion with patient, she is amenable to go back to [facility]. Currently awaiting authorization.   #Constipation (resolved) Pt  states she has not had a bowel movement since admitted to the hospital. Has been given miralax and senna, with no improvement. Will increase miralax to twice a day, and senna to twice a day.    Diet: Carb-Modified IVF: None, VTE: DOAC Code: DNR PT/OT recs: SNF for Subacute PT, . TOC recs: Working on discharge to SNF, likely back to [facility] with plan to look at other options later.    Dispo: Anticipated discharge to Skilled nursing facility in 1-3 days pending continued improvement and confirmation with [facility] that she still has a bed.   Drucie Opitz, MD Internal Medicine Resident PGY-1 Pager: [phone]  Please contact the on call pager after 5 pm and on weekends at [phone].

## 2022-05-29 ENCOUNTER — Inpatient Hospital Stay (HOSPITAL_COMMUNITY): Payer: Medicare HMO

## 2022-05-29 DIAGNOSIS — J111 Influenza due to unidentified influenza virus with other respiratory manifestations: Secondary | ICD-10-CM

## 2022-05-29 DIAGNOSIS — I5030 Unspecified diastolic (congestive) heart failure: Secondary | ICD-10-CM

## 2022-05-29 DIAGNOSIS — J9621 Acute and chronic respiratory failure with hypoxia: Secondary | ICD-10-CM | POA: Diagnosis not present

## 2022-05-29 DIAGNOSIS — I110 Hypertensive heart disease with heart failure: Secondary | ICD-10-CM

## 2022-05-29 DIAGNOSIS — J449 Chronic obstructive pulmonary disease, unspecified: Secondary | ICD-10-CM | POA: Diagnosis not present

## 2022-05-29 LAB — CBC
HCT: 40.5 % (ref 36.0–46.0)
Hemoglobin: 12.7 g/dL (ref 12.0–15.0)
MCH: 29.6 pg (ref 26.0–34.0)
MCHC: 31.4 g/dL (ref 30.0–36.0)
MCV: 94.4 fL (ref 80.0–100.0)
Platelets: 314 10*3/uL (ref 150–400)
RBC: 4.29 MIL/uL (ref 3.87–5.11)
RDW: 14.9 % (ref 11.5–15.5)
WBC: 12.1 10*3/uL — ABNORMAL HIGH (ref 4.0–10.5)
nRBC: 0 % (ref 0.0–0.2)

## 2022-05-29 LAB — BASIC METABOLIC PANEL
Anion gap: 12 (ref 5–15)
BUN: 47 mg/dL — ABNORMAL HIGH (ref 8–23)
CO2: 28 mmol/L (ref 22–32)
Calcium: 8.2 mg/dL — ABNORMAL LOW (ref 8.9–10.3)
Chloride: 97 mmol/L — ABNORMAL LOW (ref 98–111)
Creatinine, Ser: 1.34 mg/dL — ABNORMAL HIGH (ref 0.44–1.00)
GFR, Estimated: 42 mL/min — ABNORMAL LOW (ref >60)
Glucose, Bld: 123 mg/dL — ABNORMAL HIGH (ref 70–99)
Potassium: 3.8 mmol/L (ref 3.5–5.1)
Sodium: 137 mmol/L (ref 135–145)

## 2022-05-29 LAB — GLUCOSE, CAPILLARY
Glucose-Capillary: 162 mg/dL — ABNORMAL HIGH (ref 70–99)
Glucose-Capillary: 130 mg/dL — ABNORMAL HIGH (ref 70–99)
Glucose-Capillary: 141 mg/dL — ABNORMAL HIGH (ref 70–99)
Glucose-Capillary: 137 mg/dL — ABNORMAL HIGH (ref 70–99)
Glucose-Capillary: 151 mg/dL — ABNORMAL HIGH (ref 70–99)

## 2022-05-29 MED ORDER — METOPROLOL TARTRATE 12.5 MG HALF TABLET
12.5000 mg | ORAL_TABLET | Freq: Two times a day (BID) | ORAL | Status: DC
  Administered 2022-05-29 – 2022-05-30 (×3): 12.5 mg via ORAL
  Filled 2022-05-29 (×3): qty 1

## 2022-05-29 MED ORDER — LIDOCAINE 5 % EX PTCH
1.0000 | MEDICATED_PATCH | CUTANEOUS | Status: DC
  Administered 2022-05-30: 1 via TRANSDERMAL
  Filled 2022-05-29 (×2): qty 1

## 2022-05-29 NOTE — Progress Notes (Signed)
HD#6 Subjective:   Summary: Glenda Boone is a 72 y.o. female with a history of COPD, HFpEF, OSA, CKD stage IIIb, atrial fibrillation, HTN, T2DM, hypothyroidism, anxiety and depression who presented with 2 weeks of dry cough and 1 day of dyspnea complicated by fall with left lower extremity injury. She is admitted for acute on chronic hypoxemic respiratory failure.    Overnight Events: None  Pt was seen this AM bedside.She was complaining of ankle pain, especially when moving it, as well as having bad diarrhea yesterday. She states she slept well yesterday, and breathing is better than how it was on admission. She denies any chest pain, and has some feelings of her "heart beating really fast". She still feels like she can't get situated.     Objective:  Vital signs in last 24 hours: Vitals:   05/29/22 0023 05/29/22 0432 05/29/22 0753 05/29/22 1133  BP:   117/74 117/68  Pulse: 93  (!) 107 (!) 102  Resp: (!) 23  20   Temp:   98.8 F (37.1 C)   TempSrc:   Axillary   SpO2: 97%  100%   Weight:  (!) 154.7 kg    Height:       Supplemental O2: Nasal Cannula SpO2: 100 % O2 Flow Rate (L/min): 2 L/min FiO2 (%): 50 %   Physical Exam:  Constitutional: Morbidly obese, very uncomfortable, chronically ill-appearing elderly female. In no acute distress. Cardiovascular:Regular rate and rhythm.  Pulmonary: Decreased air movement throughout lung fields with expiratory wheezes and crackles, improving MSK: Cam boot over the left lower extremity, 1+ pitting edema in the lower extremities bilaterally to the knee   Filed Weights   05/23/22 2000 05/28/22 0500 05/29/22 0432  Weight: (!) 164.6 kg (!) 152.9 kg (!) 154.7 kg     Intake/Output Summary (Last 24 hours) at 05/29/2022 1250 Last data filed at 05/29/2022 1148 Gross per 24 hour  Intake 240 ml  Output 400 ml  Net -160 ml    Net IO Since Admission: -6,490 mL [05/29/22 1250]  Pertinent Labs:    Latest Ref Rng & Units 05/29/2022     3:57 AM 05/28/2022    3:07 AM 05/27/2022    4:10 AM  CBC  WBC 4.0 - 10.5 K/uL 12.1  9.3  7.6   Hemoglobin 12.0 - 15.0 g/dL 12.7  11.6  9.8   Hematocrit 36.0 - 46.0 % 40.5  36.6  32.7   Platelets 150 - 400 K/uL 314  315  288        Latest Ref Rng & Units 05/29/2022    3:57 AM 05/28/2022    3:07 AM 05/27/2022    4:10 AM  CMP  Glucose 70 - 99 mg/dL 123  144  107   BUN 8 - 23 mg/dL 47  39  36   Creatinine 0.44 - 1.00 mg/dL 1.34  1.31  1.32   Sodium 135 - 145 mmol/L 137  137  137   Potassium 3.5 - 5.1 mmol/L 3.8  4.0  4.2   Chloride 98 - 111 mmol/L 97  96  94   CO2 22 - 32 mmol/L 28  32  33   Calcium 8.9 - 10.3 mg/dL 8.2  8.4  8.5     Imaging: DG Chest 2 View  Result Date: 05/29/2022 CLINICAL DATA:  Shortness of breath, respiratory failure EXAM: CHEST - 2 VIEW COMPARISON:  Portable exam 1016 hours compared to 05/25/2022 FINDINGS: Upper normal heart size. Mediastinal  contours and pulmonary vascularity normal. Atherosclerotic calcification aorta. Improved pulmonary infiltrates since previous exam with persistent infiltrate versus atelectasis in RIGHT lower lobe. Remaining lungs clear. No pleural effusion or pneumothorax. No acute osseous findings. IMPRESSION: Improved pulmonary infiltrates with persistent atelectasis versus infiltrate RIGHT lower lobe. Aortic Atherosclerosis (ICD10-I70.0). Electronically Signed   By: Lavonia Dana M.D.   On: 05/29/2022 10:37    Assessment/Plan:   Principal Problem:   Acute on chronic hypoxic respiratory failure (HCC) Active Problems:   COPD exacerbation (HCC)   Acute on chronic heart failure with preserved ejection fraction (HCC)   Closed fracture of left ankle   Influenza A   Patient Summary: Glenda Boone is a 72 y.o. female with a history of COPD, HFpEF, OSA, CKD stage IIIb, atrial fibrillation, HTN, T2DM, hypothyroidism, anxiety and depression who presented with 2 weeks of dry cough and 1 day of dyspnea complicated by fall with left lower extremity  injury. She is admitted for acute on chronic hypoxemic respiratory failure.     #Acute on chronic hypoxemic respiratory failure secondary to influenza infection in the setting of COPD and HFpEF  Yesterday, switched patient from IV Lasix '40mg'$  to PO Lasix, and pt had good output of -470m. Lung auscultation today shows improvement with wheezes and crackles. She is chronically on 2L at home, and is currently on 3.5. Will continue to wean oxygen down to 2L, her baseline. Creatinine at 1.34, seems to be around baseline.    Plan:  - IChiropodist- Encourage patient to ambulate with PT/OT - PO Lasix '40mg'$  daily - Duonebs every 6 hours prn  - Breo ellipta and Incruse Ellipta daily - Continue faxiga '5mg'$  - Strict ins and outs  - Daily BMP    #A-fib #HTN In NSR. Blood pressure has been stable in the setting of diuresis. Pt continues to be tachycardic, with pulses hovering from 120-150. Anxiety could be playing a role in her tachycardia. Regardless will start pt on metoprolol 12.'5mg'$  BID. Will have to monitor in the outpatient setting for risk of pt becoming bradycardic. Will continue to monitor pulses and telemetry.   Plan: -Continue amlodipine 2.5 mg daily, Eliquis 5 mg twice daily - Metoprolol 12.'5mg'$  BID  #Back Pain  Pt complaining of back pain that has been persistent throughout hospital course. Back pain starts on right, and radiates to left sternum, not tender to palpitation. No erythema seen in the area.   Plan:  - Will try lidocaine patch for this patient.   #OSA Will need CPAP order on discharge. - CPAP at night   #Oblique nondisplaced fracture of the distal left fibula Orthopedics saw her on admission and placed her in a cam boot with plans for outpatient follow-up.  Weightbearing as tolerated.  Continue with PT and OT. Will schedule tylenol '1000mg'$  TID to help with pain.   Plan:  - Tylenol '1000mg'$  TID   #CKD stage IIIb #T2DM A1c on 03/28/2022 was 7.3 and patient is doing  well on Farxiga 5 mg daily.   - Continue Farxiga 5 mg and continue to monitor blood sugars and renal function   #Hypothyroidism TSH in 03/2022 was 10.2 and patient was started on levothyroxine 175 mcg daily, pharmacy clarified her dose as 200 daily - levothyroxine 200 mcg daily   #Anxiety/Depression  Per last hospital visit in November of 2023, she was started on buspar '10mg'$  BID as well as Atarax '10mg'$  BID PRN and received adequate control of her anxiety. On exam today, pt is  very anxious which could be playing a part in tachycardic episodes. Will continue with buspar and bupropion  Plan:  - Continue Buproprion - Continue Buspar '10mg'$  BID  #Disposition Pt hesistent to return to [facility] facility after acute fall that led to fracture. After discussion with patient, she is amenable to go back to [facility]. Currently awaiting authorization.      Diet: Carb-Modified IVF: None, VTE: DOAC Code: DNR PT/OT recs: SNF for Subacute PT, . TOC recs: Working on discharge to SNF, likely back to [facility] with plan to look at other options later.    Dispo: Anticipated discharge to Skilled nursing facility in 1-3 days pending continued improvement and confirmation with [facility] that she still has a bed.   Drucie Opitz, MD Internal Medicine Resident PGY-1 Pager: [phone]  Please contact the on call pager after 5 pm and on weekends at [phone].

## 2022-05-29 NOTE — Progress Notes (Signed)
Physical Therapy Treatment Patient Details Name: Glenda Boone MRN: [id number] DOB: 08-10-1950 Today's Date: 05/29/2022   History of Present Illness 72 y.o. female presents to [facility] on 05/23/2022 after a fall with LLE injury, pt also with cough and dyspnea in days preceding fall. Pt found to be flu A positive and have an oblique nondisplaced distal L fibula fx. PMH includes COPD, CHF, OSA, CKD III, afib, HTN, DMII, anxiety, depression.    PT Comments    Pt received in supine, agreeable to therapy session and with good participation and improved tolerance for unsupported sitting and transfer training. Pt quick to fatigue in stance and still verbalizes fear of falls but progressing toward goals slowly. Pt performs STS with +2 modA lift assist in Stedy frame and with elevated RR/tachycardia so unable to initiate pre-gait training this date. Pt would benefit from lift OOB daily to recliner or to dangle EOB/work on standing bedside during ADL tasks/mealtimes. Pt requesting a breathing tx from respiratory therapy, RN notified. Pt continues to benefit from PT services to progress toward functional mobility goals.    Recommendations for follow up therapy are one component of a multi-disciplinary discharge planning process, led by the attending physician.  Recommendations may be updated based on patient status, additional functional criteria and insurance authorization.  Follow Up Recommendations  Skilled nursing-short term rehab (<3 hours/day) Can patient physically be transported by private vehicle: No   Assistance Recommended at Discharge Frequent or constant Supervision/Assistance  Patient can return home with the following Two people to help with walking and/or transfers;Two people to help with bathing/dressing/bathroom;Assistance with cooking/housework;Direct supervision/assist for medications management;Direct supervision/assist for financial management;Assist for transportation;Help with  stairs or ramp for entrance   Equipment Recommendations  None recommended by PT    Recommendations for Other Services       Precautions / Restrictions Precautions Precautions: Fall Precaution Comments: Droplet precs; fear of falls Restrictions Weight Bearing Restrictions: Yes LLE Weight Bearing: Weight bearing as tolerated Other Position/Activity Restrictions: WBAT in CAM boot     Mobility  Bed Mobility Overal bed mobility: Needs Assistance Bed Mobility: Rolling, Supine to Sit, Sit to Supine Rolling: Min assist   Supine to sit: Mod assist, HOB elevated Sit to supine: Min assist, +2 for physical assistance   General bed mobility comments: increased time/effort and use of bed features to sit up, modA trunk support to reach upright and for hip advancement to foot flat; BLE and trunk support minA returning to supine due to pt fatigue. Patient Response: Anxious, Cooperative  Transfers Overall transfer level: Needs assistance Equipment used: Ambulation equipment used Transfers: Sit to/from Stand Sit to Stand: Mod assist, +2 physical assistance, From elevated surface           General transfer comment: from elevated bed<>Stedy, pt stood ~30 seconds total prior to requesting to sit, tachy to 140's bpm with standing and increased RR Transfer via Lift Equipment: Stedy  Ambulation/Gait             Pre-gait activities: pt unable to march in standing/weight shift due to fatigue/anxiety     Stairs             Wheelchair Mobility    Modified Rankin (Stroke Patients Only)       Balance Overall balance assessment: Needs assistance Sitting-balance support: Feet supported, Single extremity supported, No upper extremity supported Sitting balance-Leahy Scale: Fair Sitting balance - Comments: initial c/o lightheadedness but improved, able to sit unsupported and reach within BOS no  LOB   Standing balance support: Bilateral upper extremity supported Standing  balance-Leahy Scale: Poor Standing balance comment: +2 safety with Stedy, anterior lean over [employer] Arousal/Alertness: Awake/alert Behavior During Therapy: WFL for tasks assessed/performed, Anxious Overall Cognitive Status: Within Functional Limits for tasks assessed                                 General Comments: Pt anxious but agreeable to work on EOB/standing with gentle encouragement. Good effort but quick to fatigue. fear of bed being heigher than lowest surface height.        Exercises Other Exercises Other Exercises: seated BLE AROM:  hip flexion, LAQ x 5 reps ea    General Comments General comments (skin integrity, edema, etc.): SpO2 WFL on 2L O2 [place], HR 110-120's bpm sitting and to 140's bpm with standing; RR elevated to high 20's with exertion; RN notified pt requesting breathing tx      Pertinent Vitals/Pain Pain Assessment Pain Assessment: Faces Faces Pain Scale: Hurts a little bit Pain Location: buttocks and LLE during movement and bed mobility Pain Descriptors / Indicators: Guarding, Sore, Tender Pain Intervention(s): Monitored during session, Repositioned     PT Goals (current goals can now be found in the care plan section) Acute Rehab PT Goals Patient Stated Goal: to improve strength and mobility quality PT Goal Formulation: With patient Time For Goal Achievement: 06/07/22 Progress towards PT goals: Progressing toward goals    Frequency    Min 2X/week      PT Plan Current plan remains appropriate       AM-PAC PT "6 Clicks" Mobility   Outcome Measure  Help needed turning from your back to your side while in a flat bed without using bedrails?: A Lot Help needed moving from lying on your back to sitting on the side of a flat bed without using bedrails?: A Lot Help needed moving to and from a bed to a chair (including a wheelchair)?: Total Help needed standing up from a chair  using your arms (e.g., wheelchair or bedside chair)?: A Lot Help needed to walk in hospital room?: Total Help needed climbing 3-5 steps with a railing? : Total 6 Click Score: 9    End of Session Equipment Utilized During Treatment: Oxygen;Gait belt;Other (comment) (LLE CAM boot) Activity Tolerance: Patient tolerated treatment well;Other (comment) (HR to 140's bpm) Patient left: with call bell/phone within reach;in bed;with bed alarm set;Other (comment) (pt requests all 4 rails up; bed in chair posture) Nurse Communication: Mobility status;Need for lift equipment;Precautions;Other (comment) (requests breathing tx) PT Visit Diagnosis: Other abnormalities of gait and mobility (R26.89);Muscle weakness (generalized) (M62.81);History of falling (Z91.81)     Time: 2956-2130 PT Time Calculation (min) (ACUTE ONLY): 27 min  Charges:  $Therapeutic Activity: 23-37 mins                     Kirke Breach P., PTA Acute Rehabilitation Services Secure Chat Preferred 9a-5:30pm Office: [place] 05/29/2022, 6:11 PM

## 2022-05-29 NOTE — TOC Progression Note (Signed)
Transition of Care [facility]) - Progression Note    Patient Details  Name: Glenda Boone MRN: [id number] Date of Birth: Mar 02, 1951  [facility]) CM/SW Contact  Joanne Chars, LCSW Phone Number: 05/29/2022, 10:47 AM  Clinical Narrative:   Message from [place].  Josem Kaufmann has been approved.  MD notified.  Not ready today.    Expected Discharge Plan: [place] Barriers to Discharge: Continued Medical Work up  Expected Discharge Plan and Services In-house Referral: Clinical Social Work   Post Acute Care Choice: [place] Living arrangements for the past 2 months: [place] (from [place])                                       Social Determinants of Health ([place]) Interventions [place]: No Food Insecurity (05/23/2022)  Housing: Low Risk  (05/23/2022)  Transportation Needs: No Transportation Needs (05/23/2022)  Utilities: Not At Risk (05/23/2022)  Tobacco Use: High Risk (05/23/2022)    Readmission Risk Interventions    02/02/2020    5:30 PM  Readmission Risk Prevention Plan  Transportation Screening Complete  PCP or Specialist Appt within 3-5 Days Complete  HRI or [place] Complete  Social Work Consult for [place] Planning/Counseling Complete  Palliative Care Screening Not Applicable  Medication Review [employer] photographer) Complete

## 2022-05-30 DIAGNOSIS — J101 Influenza due to other identified influenza virus with other respiratory manifestations: Secondary | ICD-10-CM | POA: Diagnosis not present

## 2022-05-30 DIAGNOSIS — I482 Chronic atrial fibrillation, unspecified: Secondary | ICD-10-CM

## 2022-05-30 DIAGNOSIS — J9621 Acute and chronic respiratory failure with hypoxia: Secondary | ICD-10-CM | POA: Diagnosis not present

## 2022-05-30 DIAGNOSIS — S82892A Other fracture of left lower leg, initial encounter for closed fracture: Secondary | ICD-10-CM

## 2022-05-30 DIAGNOSIS — J441 Chronic obstructive pulmonary disease with (acute) exacerbation: Secondary | ICD-10-CM | POA: Diagnosis not present

## 2022-05-30 LAB — BASIC METABOLIC PANEL
Anion gap: 12 (ref 5–15)
BUN: 45 mg/dL — ABNORMAL HIGH (ref 8–23)
CO2: 29 mmol/L (ref 22–32)
Calcium: 8.2 mg/dL — ABNORMAL LOW (ref 8.9–10.3)
Chloride: 96 mmol/L — ABNORMAL LOW (ref 98–111)
Creatinine, Ser: 1.42 mg/dL — ABNORMAL HIGH (ref 0.44–1.00)
GFR, Estimated: 39 mL/min — ABNORMAL LOW (ref >60)
Glucose, Bld: 115 mg/dL — ABNORMAL HIGH (ref 70–99)
Potassium: 3.7 mmol/L (ref 3.5–5.1)
Sodium: 137 mmol/L (ref 135–145)

## 2022-05-30 LAB — CBC
HCT: 39 % (ref 36.0–46.0)
Hemoglobin: 11.8 g/dL — ABNORMAL LOW (ref 12.0–15.0)
MCH: 28.9 pg (ref 26.0–34.0)
MCHC: 30.3 g/dL (ref 30.0–36.0)
MCV: 95.4 fL (ref 80.0–100.0)
Platelets: 334 10*3/uL (ref 150–400)
RBC: 4.09 MIL/uL (ref 3.87–5.11)
RDW: 14.9 % (ref 11.5–15.5)
WBC: 12 10*3/uL — ABNORMAL HIGH (ref 4.0–10.5)
nRBC: 0 % (ref 0.0–0.2)

## 2022-05-30 LAB — GLUCOSE, CAPILLARY
Glucose-Capillary: 156 mg/dL — ABNORMAL HIGH (ref 70–99)
Glucose-Capillary: 137 mg/dL — ABNORMAL HIGH (ref 70–99)

## 2022-05-30 MED ORDER — METOPROLOL SUCCINATE ER 25 MG PO TB24: 12.5000 mg | ORAL_TABLET | Freq: Every day | ORAL | 3 refills | Status: DC

## 2022-05-30 MED ORDER — METOPROLOL SUCCINATE ER 25 MG PO TB24: 12.5000 mg | ORAL_TABLET | Freq: Every day | ORAL | Status: DC

## 2022-05-30 MED ORDER — DAPAGLIFLOZIN PROPANEDIOL 10 MG PO TABS: 10.0000 mg | ORAL_TABLET | Freq: Every day | ORAL | Status: DC

## 2022-05-30 MED ORDER — BUSPIRONE HCL 10 MG PO TABS: 10.0000 mg | ORAL_TABLET | Freq: Two times a day (BID) | ORAL | 3 refills | Status: DC

## 2022-05-30 MED ORDER — AMLODIPINE BESYLATE 2.5 MG PO TABS: 2.5000 mg | ORAL_TABLET | Freq: Every day | ORAL | 0 refills | Status: DC

## 2022-05-30 MED ORDER — FUROSEMIDE 40 MG PO TABS: 40.0000 mg | ORAL_TABLET | Freq: Every day | ORAL | 3 refills | Status: DC

## 2022-05-30 MED ORDER — SPIRONOLACTONE 25 MG PO TABS
25.0000 mg | ORAL_TABLET | Freq: Every day | ORAL | Status: DC
  Administered 2022-05-30: 25 mg via ORAL
  Filled 2022-05-30: qty 1

## 2022-05-30 MED ORDER — DAPAGLIFLOZIN PROPANEDIOL 10 MG PO TABS: 10.0000 mg | ORAL_TABLET | Freq: Every day | ORAL | 3 refills | Status: DC

## 2022-05-30 MED ORDER — ATORVASTATIN CALCIUM 20 MG PO TABS: 20.0000 mg | ORAL_TABLET | Freq: Every day | ORAL | 3 refills | Status: AC

## 2022-05-30 MED ORDER — METOPROLOL TARTRATE 12.5 MG HALF TABLET: 12.5000 mg | ORAL_TABLET | Freq: Every day | ORAL | Status: DC | PRN

## 2022-05-30 MED ORDER — IPRATROPIUM-ALBUTEROL 0.5-2.5 (3) MG/3ML IN SOLN
RESPIRATORY_TRACT | Status: AC
  Filled 2022-05-30: qty 3

## 2022-05-30 MED ORDER — IPRATROPIUM-ALBUTEROL 0.5-2.5 (3) MG/3ML IN SOLN
3.0000 mL | Freq: Four times a day (QID) | RESPIRATORY_TRACT | Status: DC | PRN
  Administered 2022-05-30: 3 mL via RESPIRATORY_TRACT

## 2022-05-30 NOTE — Discharge Summary (Addendum)
Name: Glenda Boone MRN: [id number] DOB: 1951-03-16 72 y.o. PCP: [facility]  Date of Admission: 05/23/2022 10:12 AM Date of Discharge: 05/30/2022 Attending Physician: Charise Killian, MD  Discharge Diagnosis: 1. Principal Problem:   Acute on chronic hypoxic respiratory failure (HCC) Active Problems:   COPD exacerbation (HCC)   Acute on chronic heart failure with preserved ejection fraction (HCC)   Closed fracture of left ankle   Influenza A    Discharge Medications: Allergies as of 05/30/2022       Reactions   Codeine Nausea Only, Other (See Comments)   "Allergic," per verbal MAR   Shellfish-derived Products Hives, Other (See Comments)   "Allergic," per verbal MAR   Gabapentin Other (See Comments)   Gives nightmares- "Allergic," per verbal MAR   Meperidine Hcl Anxiety, Other (See Comments)   "Allergic," per verbal MAR        Medication List     STOP taking these medications    oseltamivir 75 MG capsule Commonly known as: TAMIFLU   simvastatin 40 MG tablet Commonly known as: ZOCOR   topiramate 50 MG tablet Commonly known as: TOPAMAX       TAKE these medications    acetaminophen 325 MG tablet Commonly known as: TYLENOL Take 2 tablets (650 mg total) by mouth every 6 (six) hours as needed for mild pain (or Fever >/= 101).   albuterol 108 (90 Base) MCG/ACT inhaler Commonly known as: VENTOLIN HFA Inhale 2 puffs into the lungs every 6 (six) hours as needed for wheezing or shortness of breath.   amLODipine 2.5 MG tablet Commonly known as: NORVASC Take 1 tablet (2.5 mg total) by mouth daily. Start taking on: May 31, 2022   apixaban 5 MG Tabs tablet Commonly known as: ELIQUIS Take 1 tablet (5 mg total) by mouth 2 (two) times daily.   ascorbic acid 1000 MG tablet Commonly known as: VITAMIN C Take 1,000 mg by mouth daily.   atorvastatin 20 MG tablet Commonly known as: LIPITOR Take 1 tablet (20 mg total) by mouth daily. Start  taking on: May 31, 2022   Biofreeze 4 % Gel Generic drug: Menthol (Topical Analgesic) Apply 1 application  topically See admin instructions. Apply to affected area of the groin daily   buPROPion ER 100 MG 12 hr tablet Commonly known as: WELLBUTRIN SR Take 100 mg by mouth daily.   busPIRone 10 MG tablet Commonly known as: BUSPAR Take 1 tablet (10 mg total) by mouth 2 (two) times daily.   dapagliflozin propanediol 10 MG Tabs tablet Commonly known as: FARXIGA Take 1 tablet (10 mg total) by mouth daily. Start taking on: May 31, 2022 What changed:  medication strength how much to take   furosemide 40 MG tablet Commonly known as: LASIX Take 1 tablet (40 mg total) by mouth daily. Start taking on: May 31, 2022   guaiFENesin 600 MG 12 hr tablet Commonly known as: MUCINEX Take 600 mg by mouth 2 (two) times daily.   levothyroxine 200 MCG tablet Commonly known as: SYNTHROID Take 200 mcg by mouth daily before breakfast.   metoprolol succinate 25 MG 24 hr tablet Commonly known as: TOPROL-XL Take 0.5 tablets (12.5 mg total) by mouth daily. Can titrate up if needed. Start taking on: May 31, 2022   MiraLax 17 GM/SCOOP powder Generic drug: polyethylene glycol powder Take 17 g by mouth daily.   multivitamin tablet Take 1 tablet by mouth daily.   nystatin powder Generic drug: nystatin Apply 1 Application  topically daily. Apply to L posterior knee topically every day shift for candidiasis. Apply to skin folds behind knees, abdominal fold and under breasts.   OXYGEN Inhale 2 L/min into the lungs 3 (three) times daily. Every shift   spironolactone 25 MG tablet Commonly known as: ALDACTONE Take 25 mg by mouth daily.   tiZANidine 2 MG tablet Commonly known as: ZANAFLEX Take 2 mg by mouth daily as needed for muscle spasms.   Trelegy Ellipta 100-62.5-25 MCG/ACT Aepb Generic drug: Fluticasone-Umeclidin-Vilant Inhale 1 puff into the lungs daily.        Disposition  and follow-up:   Glenda Boone was discharged from [facility] in Good condition.  At the hospital follow up visit please address:  1.    Acute on chronic hypoxic respiratory failure 2/2 influenza and HFpEF exacerbation- Please ensure pt's volume status and compliance with medications, as well as respiratory status on home 2L. We have added Lasix as a home medication '40mg'$  once a day, please ensure follow up BMP is done to monitor kidney function.   A-Fib/Tachycardia - Metoprolol succinate was used to help keep pulses in control, was previously held on last admission due to bradycardia. Please titrate metoprolol as necessary.   Ankle Fracture - please ensure pt has outpatient follow up orthopedics and works with physical therapy.   2.  Labs / imaging needed at time of follow-up: BMP/CBC  3.  Pending labs/ test needing follow-up: N/A  Follow-up Appointments:  Contact information for after-discharge care     Destination     [facility] .   Service: Skilled Nursing Contact information: [address] [place] [facility] Course by problem list:  Acute on chronic hypoxic respiratory failure Influenza A COPD exacerbation Patient presented from [place] facility with 1 day of acute dyspnea associated with 3 days of fatigue.  She was initially on BiPAP when she got to the ED but was quickly stable on 5 L nasal cannula.  She is on 2 L nasal cannula at home.  She was found to be flu a positive and her chest x-ray showed bilateral opacifications in the perihilar regions concerning for pneumonia.  With her underlying COPD we treated her for both influenza and a COPD exacerbation which she responded well to.  She initially fluctuated between 3-4 L on nasal cannula.  We then became concerned that she had some extra fluid on her lungs that was complicating her continued recovery.  With IV diuresis  she was able to saturate well at rest on her home 2 L.  She has worked with physical therapy and has shown significant improvement, and has been maintained on home 2 L. IV Lasix was changed to PO Lasix and patient continued to show improvement. Will continue PO Lasix in the outpatient setting.  Total treatment included 5 days of Tamiflu, 1 day of methylprednisolone IV, 4 days of prednisone, and 3 days of azithromycin.    HFpEF, acute exacerbation On admission patient did not appear to be in an acute heart failure exacerbation.  Her BNP was low however this could be falsely low due to her BMI and age.  She does have chronic edema but did not think that it was worse than usual on admission.  After initial treatments she did have a setback in her respiratory status and showed signs of increased fluid  on exam and on chest x-ray.  She has not been on outpatient diuretics.  We treated her with Lasix 40 mg IV which she responded very well to, -2 L after first dose.  She was able to saturate well on her home 2 L at rest after this.  We expect that she will need a loop diuretic on discharge. Will continue '40mg'$  Lasix in the outpatient setting with close monitoring of kidney function and volume status.  We continued her Wilder Glade initially, but recommend increasing to '10mg'$  for HFpEF.   Oblique nondisplaced fracture of the distal left fibula Patient fell the day prior to admission with injury to her left lower extremity and was found to have a fracture as above.  Orthopedics saw her and placed her in a cam boot with plans for outpatient follow-up.  She was cleared for weightbearing as tolerated and has been working with PT.   A-fib HTN Patient has history of A-fib but was not on rate or rhythm control prior to admission.  On tele patient has had brief sinus tachycardia and sinus bradycardia without symptoms. Rates vary from 50-120 mostly with a majority in NSR. Unknown if related to certain activities including brief  sleep or exertion.  Her blood pressures were initially low when we held some of her antihypertensives.  Pharmacy later clarified her medications with her.  It appears that she is taking amlodipine 2.5 mg daily and spironolactone 25 mg daily.    We did continue her Eliquis 5 mg twice daily. She developed AF w/ RVR during admission requiring beta blockade for rate control. We recommend continuation of Toprol-xl 12.'5mg'$  daily, and titrate up or down as needed.     CKD stage IIIb T2DM A1c on 03/28/2022 was 7.3 and patient is doing well on Farxiga 5 mg daily.  Renal function on admission appeared to be at baseline with GFR of 44.  We increased Farxiga to 10 mg daily for HFpEF dosing.   Hypothyroidism TSH in 03/2022 was 10.2 and patient was started on levothyroxine 175 mcg daily.  Pharmacy did confirm that the patient was on 200 mcg daily so that was changed.   Anxiety/Depression  Patient appears to have a long history of anxiety and depression previously saw a mental health professional but had to stop seeing them about 3 years ago due to financial reasons.  She has been taking Wellbutrin 100 mg daily and topiramate 50 mg twice daily.  It appears that topiramate is mainly for weight loss, and was held on admission. Started home Buspar '10mg'$  BID, can continue in the outpatient setting.     Discharge Exam:   BP 127/82 (BP Location: Left Arm)   Pulse 100   Temp 98.5 F (36.9 C) (Oral)   Resp 20   Ht '5\' 4"'$  (1.626 m)   Wt (!) 154.7 kg   SpO2 91%   BMI 58.54 kg/m  Discharge exam:   Constitutional: Morbidly obese, chronically ill-appearing elderly female. In no acute distress. Cardiovascular:Regular rate and rhythm.  Pulmonary: Mild expiratory wheezes and crackles MSK: Cam boot over the left lower extremity, 1+ pitting edema in the lower extremities bilaterally to the knee  Pertinent Labs, Studies, and Procedures:     Latest Ref Rng & Units 05/30/2022    5:03 AM 05/29/2022    3:57 AM 05/28/2022     3:07 AM  CBC  WBC 4.0 - 10.5 K/uL 12.0  12.1  9.3   Hemoglobin 12.0 - 15.0 g/dL 11.8  12.7  11.6   Hematocrit 36.0 - 46.0 % 39.0  40.5  36.6   Platelets 150 - 400 K/uL 334  314  315       Latest Ref Rng & Units 05/30/2022    5:03 AM 05/29/2022    3:57 AM 05/28/2022    3:07 AM  BMP  Glucose 70 - 99 mg/dL 115  123  144   BUN 8 - 23 mg/dL 45  47  39   Creatinine 0.44 - 1.00 mg/dL 1.42  1.34  1.31   Sodium 135 - 145 mmol/L 137  137  137   Potassium 3.5 - 5.1 mmol/L 3.7  3.8  4.0   Chloride 98 - 111 mmol/L 96  97  96   CO2 22 - 32 mmol/L 29  28  32   Calcium 8.9 - 10.3 mg/dL 8.2  8.2  8.4     DG Ankle 2 Views Left  Result Date: 05/23/2022 CLINICAL DATA:  Fall EXAM: LEFT ANKLE - 2 VIEW COMPARISON:  None available. FINDINGS: There is an acute oblique fracture of the distal fibular metaphysis. Alignment is maintained. The ankle mortise appears intact on the provided views. There is marked surrounding soft tissue swelling. There is mild inferior calcaneal spurring and Achilles enthesopathy. IMPRESSION: Acute oblique nondisplaced fracture of the distal fibula. Electronically Signed   By: Valetta Mole M.D.   On: 05/23/2022 11:44   DG Chest Portable 1 View  Result Date: 05/23/2022 CLINICAL DATA:  Shortness of breath, respiratory failure. EXAM: PORTABLE CHEST 1 VIEW COMPARISON:  03/28/2022 and CT chest 09/10/2010. FINDINGS: Trachea is midline. Heart size stable. Bilateral perihilar opacification with streaky densities in the lung bases. There may be fluid along the minor fissure. Lungs are low in volume. Right hemidiaphragm is chronically elevated. IMPRESSION: Bilateral perihilar opacification may be due to vascular congestion. Difficult to exclude pneumonia. Consider CT chest with contrast in further evaluation, as clinically indicated. Electronically Signed   By: Lorin Picket M.D.   On: 05/23/2022 10:46      Discharge Instructions: Discharge Instructions     Call MD for:  difficulty breathing,  headache or visual disturbances   Complete by: As directed    Call MD for:  persistant nausea and vomiting   Complete by: As directed    Call MD for:  severe uncontrolled pain   Complete by: As directed    Diet - low sodium heart healthy   Complete by: As directed    Increase activity slowly   Complete by: As directed    No wound care   Complete by: As directed        Signed: Drucie Opitz, MD 05/30/2022, 1:53 PM   Pager: [phone]

## 2022-05-30 NOTE — Discharge Instructions (Addendum)
It was a pleasure taking care of you while you were here. You were brought in for flu that had led to COPD and Heart Failure exacerbation. After giving you some IV Lasix, and then switching you to oral lasix to get some fluid off of you you're breathing requirement of 2L of O2 was met. For your heart rate, we're starting you on a medication called metoprolol once a day. Your facility can monitor if you need more of a dose or not. Please follow up with your primary care provider as soon as possible.  The number for the orthopedic doctor is [phone], please make an appointment with them when you get a chance, or your facility can make one for you.Thank you.

## 2022-05-30 NOTE — Care Management Important Message (Signed)
Important Message  Patient Details  Name: Glenda Boone MRN: [id number] Date of Birth: [birth year]   Medicare Important Message Given:  Other (see comment)     Hannah Beat 05/30/2022, 1:38 PM

## 2022-05-30 NOTE — Progress Notes (Signed)
1/5 Spoke to patient's sister via telephone, and explained IMM Letter. Patient lives at [place]. Sister requested that the letter be mailed to her instead. Sister Anderson Malta) provided her address of: [address], [place] [address]. Letter was verbally acknowledged.

## 2022-05-30 NOTE — TOC Transition Note (Signed)
Transition of Care [facility]) - CM/SW Discharge Note   Patient Details  Name: Glenda Boone MRN: [id number] Date of Birth: 1951/05/19  Transition of [facility]) CM/SW Contact:  Joanne Chars, LCSW Phone Number: 05/30/2022, 2:13 PM   Clinical Narrative:   Pt discharging to [place].  RN call report to [phone].    1000: CSW confirmed with [facility] that they can receive pt today.  Final next level of care: [place] Barriers to Discharge: Barriers Resolved   Patient Goals and CMS Choice      Discharge Placement                Patient chooses bed at:  [facility]) Patient to be transferred to facility by: [place] Name of family member notified: left message with sister Anderson Malta Patient and family notified of of transfer: 05/30/22  Discharge Plan and Services Additional resources added to the After Visit Summary for   In-house Referral: Clinical Social Work   Post Acute Care Choice: [place]                               Social Determinants of Health ([place]) Interventions [place]: No Food Insecurity (05/23/2022)  Housing: Low Risk  (05/23/2022)  Transportation Needs: No Transportation Needs (05/23/2022)  Utilities: Not At Risk (05/23/2022)  Tobacco Use: High Risk (05/23/2022)     Readmission Risk Interventions    02/02/2020    5:30 PM  Readmission Risk Prevention Plan  Transportation Screening Complete  PCP or Specialist Appt within 3-5 Days Complete  HRI or [place] Complete  Social Work Consult for [place] Planning/Counseling Complete  Palliative Care Screening Not Applicable  Medication Review Press photographer) Complete

## 2022-11-18 ENCOUNTER — Other Ambulatory Visit: Payer: Self-pay | Admitting: Physician Assistant

## 2022-11-18 DIAGNOSIS — Z1231 Encounter for screening mammogram for malignant neoplasm of breast: Secondary | ICD-10-CM

## 2022-11-25 ENCOUNTER — Ambulatory Visit
Admission: RE | Admit: 2022-11-25 | Discharge: 2022-11-25 | Disposition: A | Discharge status: Home / Self Care | Payer: Medicare HMO | Source: Ambulatory Visit | Attending: Physician Assistant | Admitting: Physician Assistant

## 2022-11-25 DIAGNOSIS — Z1231 Encounter for screening mammogram for malignant neoplasm of breast: Secondary | ICD-10-CM

## 2023-08-09 ENCOUNTER — Inpatient Hospital Stay (HOSPITAL_COMMUNITY)
Admission: EM | Admit: 2023-08-09 | Discharge: 2023-08-17 | DRG: 291 | Disposition: A | Discharge status: Skilled Nursing Facility | Attending: Internal Medicine | Admitting: Internal Medicine

## 2023-08-09 ENCOUNTER — Other Ambulatory Visit: Payer: Self-pay

## 2023-08-09 ENCOUNTER — Encounter (HOSPITAL_COMMUNITY): Payer: Self-pay | Admitting: Internal Medicine

## 2023-08-09 ENCOUNTER — Emergency Department (HOSPITAL_COMMUNITY)

## 2023-08-09 DIAGNOSIS — E039 Hypothyroidism, unspecified: Secondary | ICD-10-CM | POA: Diagnosis present

## 2023-08-09 DIAGNOSIS — Z8261 Family history of arthritis: Secondary | ICD-10-CM

## 2023-08-09 DIAGNOSIS — F419 Anxiety disorder, unspecified: Secondary | ICD-10-CM | POA: Diagnosis present

## 2023-08-09 DIAGNOSIS — L89152 Pressure ulcer of sacral region, stage 2: Secondary | ICD-10-CM | POA: Diagnosis present

## 2023-08-09 DIAGNOSIS — R197 Diarrhea, unspecified: Secondary | ICD-10-CM

## 2023-08-09 DIAGNOSIS — J9602 Acute respiratory failure with hypercapnia: Secondary | ICD-10-CM | POA: Diagnosis not present

## 2023-08-09 DIAGNOSIS — J9621 Acute and chronic respiratory failure with hypoxia: Secondary | ICD-10-CM | POA: Diagnosis present

## 2023-08-09 DIAGNOSIS — F17210 Nicotine dependence, cigarettes, uncomplicated: Secondary | ICD-10-CM | POA: Diagnosis present

## 2023-08-09 DIAGNOSIS — I130 Hypertensive heart and chronic kidney disease with heart failure and stage 1 through stage 4 chronic kidney disease, or unspecified chronic kidney disease: Principal | ICD-10-CM | POA: Diagnosis present

## 2023-08-09 DIAGNOSIS — E1122 Type 2 diabetes mellitus with diabetic chronic kidney disease: Secondary | ICD-10-CM | POA: Diagnosis present

## 2023-08-09 DIAGNOSIS — J449 Chronic obstructive pulmonary disease, unspecified: Secondary | ICD-10-CM | POA: Diagnosis present

## 2023-08-09 DIAGNOSIS — Z66 Do not resuscitate: Secondary | ICD-10-CM | POA: Diagnosis present

## 2023-08-09 DIAGNOSIS — Z79890 Hormone replacement therapy (postmenopausal): Secondary | ICD-10-CM

## 2023-08-09 DIAGNOSIS — R0602 Shortness of breath: Secondary | ICD-10-CM | POA: Diagnosis present

## 2023-08-09 DIAGNOSIS — Z79899 Other long term (current) drug therapy: Secondary | ICD-10-CM

## 2023-08-09 DIAGNOSIS — Z6841 Body Mass Index (BMI) 40.0 and over, adult: Secondary | ICD-10-CM | POA: Diagnosis not present

## 2023-08-09 DIAGNOSIS — Z800 Family history of malignant neoplasm of digestive organs: Secondary | ICD-10-CM

## 2023-08-09 DIAGNOSIS — G9341 Metabolic encephalopathy: Secondary | ICD-10-CM | POA: Diagnosis present

## 2023-08-09 DIAGNOSIS — I5031 Acute diastolic (congestive) heart failure: Secondary | ICD-10-CM | POA: Diagnosis not present

## 2023-08-09 DIAGNOSIS — B955 Unspecified streptococcus as the cause of diseases classified elsewhere: Secondary | ICD-10-CM

## 2023-08-09 DIAGNOSIS — I480 Paroxysmal atrial fibrillation: Secondary | ICD-10-CM | POA: Diagnosis present

## 2023-08-09 DIAGNOSIS — I509 Heart failure, unspecified: Secondary | ICD-10-CM | POA: Diagnosis not present

## 2023-08-09 DIAGNOSIS — J9622 Acute and chronic respiratory failure with hypercapnia: Secondary | ICD-10-CM | POA: Diagnosis present

## 2023-08-09 DIAGNOSIS — N1832 Chronic kidney disease, stage 3b: Secondary | ICD-10-CM | POA: Diagnosis present

## 2023-08-09 DIAGNOSIS — Z7901 Long term (current) use of anticoagulants: Secondary | ICD-10-CM

## 2023-08-09 DIAGNOSIS — E662 Morbid (severe) obesity with alveolar hypoventilation: Secondary | ICD-10-CM | POA: Diagnosis present

## 2023-08-09 DIAGNOSIS — E872 Acidosis, unspecified: Secondary | ICD-10-CM | POA: Diagnosis present

## 2023-08-09 DIAGNOSIS — R7881 Bacteremia: Secondary | ICD-10-CM | POA: Diagnosis present

## 2023-08-09 DIAGNOSIS — I482 Chronic atrial fibrillation, unspecified: Secondary | ICD-10-CM | POA: Diagnosis present

## 2023-08-09 DIAGNOSIS — E1142 Type 2 diabetes mellitus with diabetic polyneuropathy: Secondary | ICD-10-CM | POA: Diagnosis present

## 2023-08-09 DIAGNOSIS — Z8249 Family history of ischemic heart disease and other diseases of the circulatory system: Secondary | ICD-10-CM

## 2023-08-09 DIAGNOSIS — Z90710 Acquired absence of both cervix and uterus: Secondary | ICD-10-CM

## 2023-08-09 DIAGNOSIS — L8990 Pressure ulcer of unspecified site, unspecified stage: Secondary | ICD-10-CM | POA: Insufficient documentation

## 2023-08-09 DIAGNOSIS — K219 Gastro-esophageal reflux disease without esophagitis: Secondary | ICD-10-CM | POA: Diagnosis present

## 2023-08-09 DIAGNOSIS — Z9049 Acquired absence of other specified parts of digestive tract: Secondary | ICD-10-CM

## 2023-08-09 DIAGNOSIS — Z7401 Bed confinement status: Secondary | ICD-10-CM

## 2023-08-09 DIAGNOSIS — E66813 Obesity, class 3: Secondary | ICD-10-CM | POA: Diagnosis present

## 2023-08-09 DIAGNOSIS — D539 Nutritional anemia, unspecified: Secondary | ICD-10-CM | POA: Diagnosis present

## 2023-08-09 DIAGNOSIS — Z9981 Dependence on supplemental oxygen: Secondary | ICD-10-CM

## 2023-08-09 DIAGNOSIS — E7800 Pure hypercholesterolemia, unspecified: Secondary | ICD-10-CM | POA: Diagnosis present

## 2023-08-09 DIAGNOSIS — I5033 Acute on chronic diastolic (congestive) heart failure: Secondary | ICD-10-CM | POA: Diagnosis present

## 2023-08-09 DIAGNOSIS — Z8543 Personal history of malignant neoplasm of ovary: Secondary | ICD-10-CM

## 2023-08-09 DIAGNOSIS — F32A Depression, unspecified: Secondary | ICD-10-CM | POA: Diagnosis present

## 2023-08-09 DIAGNOSIS — D649 Anemia, unspecified: Secondary | ICD-10-CM | POA: Diagnosis not present

## 2023-08-09 DIAGNOSIS — B952 Enterococcus as the cause of diseases classified elsewhere: Secondary | ICD-10-CM | POA: Diagnosis present

## 2023-08-09 HISTORY — DX: Heart failure, unspecified: I50.9

## 2023-08-09 LAB — HEMOGLOBIN A1C
Hgb A1c MFr Bld: 7.8 % — ABNORMAL HIGH (ref 4.8–5.6)
Mean Plasma Glucose: 177.16 mg/dL

## 2023-08-09 LAB — CBC WITH DIFFERENTIAL/PLATELET
Abs Immature Granulocytes: 0.07 10*3/uL (ref 0.00–0.07)
Basophils Absolute: 0.1 10*3/uL (ref 0.0–0.1)
Basophils Relative: 1 %
Eosinophils Absolute: 0.2 10*3/uL (ref 0.0–0.5)
Eosinophils Relative: 2 %
HCT: 35 % — ABNORMAL LOW (ref 36.0–46.0)
Hemoglobin: 9.7 g/dL — ABNORMAL LOW (ref 12.0–15.0)
Immature Granulocytes: 1 %
Lymphocytes Relative: 9 %
Lymphs Abs: 1 10*3/uL (ref 0.7–4.0)
MCH: 29.4 pg (ref 26.0–34.0)
MCHC: 27.7 g/dL — ABNORMAL LOW (ref 30.0–36.0)
MCV: 106.1 fL — ABNORMAL HIGH (ref 80.0–100.0)
Monocytes Absolute: 0.6 10*3/uL (ref 0.1–1.0)
Monocytes Relative: 5 %
Neutro Abs: 9.1 10*3/uL — ABNORMAL HIGH (ref 1.7–7.7)
Neutrophils Relative %: 82 %
Platelets: 250 10*3/uL (ref 150–400)
RBC: 3.3 MIL/uL — ABNORMAL LOW (ref 3.87–5.11)
RDW: 15.5 % (ref 11.5–15.5)
WBC: 11 10*3/uL — ABNORMAL HIGH (ref 4.0–10.5)
nRBC: 0 % (ref 0.0–0.2)

## 2023-08-09 LAB — COMPREHENSIVE METABOLIC PANEL
ALT: 14 U/L (ref 0–44)
AST: 12 U/L — ABNORMAL LOW (ref 15–41)
Albumin: 3.3 g/dL — ABNORMAL LOW (ref 3.5–5.0)
Alkaline Phosphatase: 100 U/L (ref 38–126)
Anion gap: 6 (ref 5–15)
BUN: 26 mg/dL — ABNORMAL HIGH (ref 8–23)
CO2: 35 mmol/L — ABNORMAL HIGH (ref 22–32)
Calcium: 8.5 mg/dL — ABNORMAL LOW (ref 8.9–10.3)
Chloride: 100 mmol/L (ref 98–111)
Creatinine, Ser: 1.33 mg/dL — ABNORMAL HIGH (ref 0.44–1.00)
GFR, Estimated: 42 mL/min — ABNORMAL LOW (ref >60)
Glucose, Bld: 193 mg/dL — ABNORMAL HIGH (ref 70–99)
Potassium: 4.8 mmol/L (ref 3.5–5.1)
Sodium: 141 mmol/L (ref 135–145)
Total Bilirubin: 0.5 mg/dL (ref 0.0–1.2)
Total Protein: 7 g/dL (ref 6.5–8.1)

## 2023-08-09 LAB — CBG MONITORING, ED: Glucose-Capillary: 138 mg/dL — ABNORMAL HIGH (ref 70–99)

## 2023-08-09 LAB — TROPONIN I (HIGH SENSITIVITY)
Troponin I (High Sensitivity): 6 ng/L (ref <18)
Troponin I (High Sensitivity): 7 ng/L (ref <18)

## 2023-08-09 LAB — BRAIN NATRIURETIC PEPTIDE: B Natriuretic Peptide: 215.3 pg/mL — ABNORMAL HIGH (ref 0.0–100.0)

## 2023-08-09 LAB — GLUCOSE, CAPILLARY: Glucose-Capillary: 193 mg/dL — ABNORMAL HIGH (ref 70–99)

## 2023-08-09 MED ORDER — INSULIN ASPART 100 UNIT/ML IJ SOLN
0.0000 [IU] | Freq: Three times a day (TID) | INTRAMUSCULAR | Status: DC
  Administered 2023-08-09: 2 [IU] via SUBCUTANEOUS
  Administered 2023-08-10: 3 [IU] via SUBCUTANEOUS
  Filled 2023-08-09: qty 0.15

## 2023-08-09 MED ORDER — LEVOTHYROXINE SODIUM 100 MCG PO TABS
200.0000 ug | ORAL_TABLET | Freq: Every day | ORAL | Status: DC
  Administered 2023-08-10: 200 ug via ORAL
  Filled 2023-08-09: qty 2

## 2023-08-09 MED ORDER — METOPROLOL SUCCINATE ER 25 MG PO TB24: 12.5000 mg | ORAL_TABLET | Freq: Every day | ORAL | Status: DC

## 2023-08-09 MED ORDER — DAPAGLIFLOZIN PROPANEDIOL 10 MG PO TABS
10.0000 mg | ORAL_TABLET | Freq: Every day | ORAL | Status: DC
  Filled 2023-08-09: qty 1

## 2023-08-09 MED ORDER — BUSPIRONE HCL 5 MG PO TABS
7.5000 mg | ORAL_TABLET | Freq: Two times a day (BID) | ORAL | Status: DC
  Administered 2023-08-09: 7.5 mg via ORAL
  Filled 2023-08-09: qty 1.5
  Filled 2023-08-09: qty 2

## 2023-08-09 MED ORDER — FUROSEMIDE 10 MG/ML IJ SOLN
40.0000 mg | Freq: Every day | INTRAMUSCULAR | Status: DC
  Administered 2023-08-10: 40 mg via INTRAVENOUS
  Filled 2023-08-09: qty 4

## 2023-08-09 MED ORDER — ACETAMINOPHEN 325 MG PO TABS: 650.0000 mg | ORAL_TABLET | Freq: Four times a day (QID) | ORAL | Status: DC | PRN

## 2023-08-09 MED ORDER — FUROSEMIDE 10 MG/ML IJ SOLN
40.0000 mg | Freq: Once | INTRAMUSCULAR | Status: AC
  Administered 2023-08-09: 40 mg via INTRAVENOUS
  Filled 2023-08-09: qty 4

## 2023-08-09 MED ORDER — APIXABAN 5 MG PO TABS
5.0000 mg | ORAL_TABLET | Freq: Two times a day (BID) | ORAL | Status: DC
  Administered 2023-08-09: 5 mg via ORAL
  Filled 2023-08-09: qty 1

## 2023-08-09 MED ORDER — TRAZODONE HCL 50 MG PO TABS
25.0000 mg | ORAL_TABLET | Freq: Every evening | ORAL | Status: DC | PRN
  Administered 2023-08-09: 25 mg via ORAL
  Filled 2023-08-09: qty 1

## 2023-08-09 MED ORDER — ATORVASTATIN CALCIUM 40 MG PO TABS
20.0000 mg | ORAL_TABLET | Freq: Every day | ORAL | Status: DC
  Administered 2023-08-09: 20 mg via ORAL
  Filled 2023-08-09: qty 1

## 2023-08-09 MED ORDER — ACETAMINOPHEN 650 MG RE SUPP: 650.0000 mg | Freq: Four times a day (QID) | RECTAL | Status: DC | PRN

## 2023-08-09 MED ORDER — ONDANSETRON HCL 4 MG PO TABS: 4.0000 mg | ORAL_TABLET | Freq: Four times a day (QID) | ORAL | Status: DC | PRN

## 2023-08-09 MED ORDER — AMLODIPINE BESYLATE 5 MG PO TABS: 2.5000 mg | ORAL_TABLET | Freq: Every day | ORAL | Status: DC

## 2023-08-09 MED ORDER — INSULIN ASPART 100 UNIT/ML IJ SOLN
0.0000 [IU] | Freq: Every day | INTRAMUSCULAR | Status: DC
  Filled 2023-08-09: qty 0.05

## 2023-08-09 MED ORDER — BUPROPION HCL ER (SR) 100 MG PO TB12: 100.0000 mg | ORAL_TABLET | Freq: Every day | ORAL | Status: DC

## 2023-08-09 MED ORDER — TOPIRAMATE 25 MG PO TABS
50.0000 mg | ORAL_TABLET | Freq: Every day | ORAL | Status: DC
  Administered 2023-08-09 – 2023-08-16 (×7): 50 mg via ORAL
  Filled 2023-08-09 (×7): qty 2

## 2023-08-09 MED ORDER — TIZANIDINE HCL 4 MG PO TABS: 2.0000 mg | ORAL_TABLET | Freq: Every day | ORAL | Status: DC | PRN

## 2023-08-09 MED ORDER — ALBUTEROL SULFATE (2.5 MG/3ML) 0.083% IN NEBU: 2.5000 mg | INHALATION_SOLUTION | RESPIRATORY_TRACT | Status: DC | PRN

## 2023-08-09 MED ORDER — ONDANSETRON HCL 4 MG/2ML IJ SOLN: 4.0000 mg | Freq: Four times a day (QID) | INTRAMUSCULAR | Status: DC | PRN

## 2023-08-09 NOTE — H&P (Signed)
 History and Physical  Glenda Boone FAO:[id number] DOB: 05-22-1951 DOA: 08/09/2023  PCP: [facility]   Chief Complaint: Shortness of breath, leg swelling  HPI: Glenda Boone is a 73 y.o. female with medical history significant for morbid obesity, bedbound, hypothyroidism, type 2 diabetes, COPD chronically on 2 L nasal cannula oxygen, heart failure with preserved EF being admitted to the hospital with acute on chronic hypoxia and suspected acute on chronic heart failure with preserved EF.  Patient states she started having increased leg swelling about 3 days ago, she was placed on increased Lasix.  Despite this, the leg swelling did not improve.  She denies any chest pain, fevers, nausea, vomiting.  Yesterday, she started having some shortness of breath.  At baseline she wears 2 L nasal cannula oxygen but since yesterday she was placed on 4 L nasal cannula oxygen.  She was brought to the hospital by EMS for further workup, workup as detailed below showed evidence of acute heart failure.  She was given 40 mg IV Lasix and admitted to the hospitalist service.  Review of Systems: Please see HPI for pertinent positives and negatives. A complete 10 system review of systems are otherwise negative.  Past Medical History:  Diagnosis Date   Anxiety    Bronchitis    COPD (chronic obstructive pulmonary disease) (HCC)    Depression    Diabetes mellitus, type II (HCC)    Dyspnea    GERD (gastroesophageal reflux disease)    Hyperlipidemia    Hypertension    Hypothyroidism    Neuropathy    Ovarian cancer (HCC)    Shingles    Thyroid disease    Past Surgical History:  Procedure Laterality Date   ABDOMINAL HYSTERECTOMY     ANKLE SURGERY Left    CHOLECYSTECTOMY     OVARY SURGERY     SMALL INTESTINE SURGERY     TONSILLECTOMY     Social History:  reports that she has been smoking cigarettes. She started smoking about 43 years ago. She has a 43.6 pack-year smoking history.  She has never used smokeless tobacco. She reports that she does not drink alcohol and does not use drugs.  Allergies  Allergen Reactions   Codeine Nausea Only and Other (See Comments)    "Allergic," per verbal MAR   Shellfish-Derived Products Hives and Other (See Comments)    "Allergic," per verbal MAR   Gabapentin Other (See Comments)    Gives nightmares- "Allergic," per verbal MAR   Meperidine Hcl Anxiety and Other (See Comments)    "Allergic," per verbal MAR    Family History  Problem Relation Age of Onset   Heart Problems Mother    Hypertension Mother    Colon cancer Father    Arthritis-Osteo Sister      Prior to Admission medications   Medication Sig Start Date End Date Taking? Authorizing Provider  acetaminophen (TYLENOL) 325 MG tablet Take 2 tablets (650 mg total) by mouth every 6 (six) hours as needed for mild pain (or Fever >/= 101). 04/03/22  Yes Marolyn Haller, MD  albuterol (VENTOLIN HFA) 108 (90 Base) MCG/ACT inhaler Inhale 2 puffs into the lungs every 6 (six) hours as needed for wheezing or shortness of breath. 01/01/20  Yes Almon Hercules, MD  amLODipine (NORVASC) 2.5 MG tablet Take 1 tablet (2.5 mg total) by mouth daily. 05/31/22  Yes Nooruddin, Jason Fila, MD  apixaban (ELIQUIS) 5 MG TABS tablet Take 1 tablet (5 mg total) by mouth 2 (  two) times daily. 02/16/20  Yes Darlin Priestly, MD  atorvastatin (LIPITOR) 20 MG tablet Take 1 tablet (20 mg total) by mouth daily. 05/31/22  Yes Nooruddin, Jason Fila, MD  barrier cream (NON-SPECIFIED) CREA Apply 1 Application topically 2 (two) times daily. To labia and groin area for irritation  and with all incontinent care.   Yes [provider]  buPROPion ER (WELLBUTRIN SR) 100 MG 12 hr tablet Take 100 mg by mouth daily.   Yes [provider]  busPIRone (BUSPAR) 7.5 MG tablet Take 7.5 mg by mouth 2 (two) times daily.   Yes [provider]  dapagliflozin propanediol (FARXIGA) 10 MG TABS tablet Take 1 tablet (10 mg total) by mouth  daily. 05/31/22  Yes Nooruddin, Jason Fila, MD  furosemide (LASIX) 40 MG tablet Take 1 tablet (40 mg total) by mouth daily. 05/31/22  Yes Nooruddin, Jason Fila, MD  ibuprofen (ADVIL) 200 MG tablet Take 400 mg by mouth daily as needed for moderate pain (pain score 4-6).   Yes [provider]  levothyroxine (SYNTHROID) 200 MCG tablet Take 200 mcg by mouth daily before breakfast.   Yes [provider]  lidocaine 4 % Place 1 patch onto the skin daily.   Yes [provider]  metoprolol succinate (TOPROL-XL) 25 MG 24 hr tablet Take 0.5 tablets (12.5 mg total) by mouth daily. Can titrate up if needed. 05/31/22  Yes Nooruddin, Jason Fila, MD  OXYGEN Inhale 2 L/min into the lungs continuous. Every shift   Yes [provider]  polyethylene glycol powder (MIRALAX) 17 GM/SCOOP powder Take 17 g by mouth daily as needed for moderate constipation.   Yes [provider]  tetrahydrozoline 0.05 % ophthalmic solution Place 2 drops into both eyes.   Yes [provider]  tiZANidine (ZANAFLEX) 2 MG tablet Take 2 mg by mouth daily as needed for muscle spasms.   Yes [provider]  topiramate (TOPAMAX) 50 MG tablet Take 50 mg by mouth at bedtime.   Yes [provider]  busPIRone (BUSPAR) 10 MG tablet Take 1 tablet (10 mg total) by mouth 2 (two) times daily. Patient not taking: Reported on 08/09/2023 05/30/22   Nooruddin, Jason Fila, MD    Physical Exam: BP (!) 158/63   Pulse 62   Temp 97.7 F (36.5 C) (Oral)   Resp (!) 26   SpO2 100%  General:  Alert, oriented, calm, in no acute distress, wearing 4 L nasal cannula oxygen.  Patient is morbidly obese, looks overall fluid overloaded. Cardiovascular: RRR, no murmurs or rubs, 3+ pitting bilateral lower extremity edema  Respiratory: clear to auscultation bilaterally, no wheezes, no crackles  Abdomen: soft, nontender, nondistended, normal bowel tones heard  Skin: dry, no rashes  Musculoskeletal: no joint effusions, normal range of  motion  Psychiatric: appropriate affect, normal speech  Neurologic: extraocular muscles intact, clear speech, moving all extremities with intact sensorium         Labs on Admission:  Basic Metabolic Panel: Recent Labs  Lab 08/09/23 1123  NA 141  K 4.8  CL 100  CO2 35*  GLUCOSE 193*  BUN 26*  CREATININE 1.33*  CALCIUM 8.5*   Liver Function Tests: Recent Labs  Lab 08/09/23 1123  AST 12*  ALT 14  ALKPHOS 100  BILITOT 0.5  PROT 7.0  ALBUMIN 3.3*   No results for input(s): "LIPASE", "AMYLASE" in the last 168 hours. No results for input(s): "AMMONIA" in the last 168 hours. CBC: Recent Labs  Lab 08/09/23 1123  WBC 11.0*  NEUTROABS 9.1*  HGB 9.7*  HCT 35.0*  MCV 106.1*  PLT 250   Cardiac Enzymes: No results for input(s): "CKTOTAL", "CKMB", "CKMBINDEX", "TROPONINI" in the last 168 hours. BNP (last 3 results) Recent Labs    08/09/23 1123  BNP 215.3*    ProBNP (last 3 results) No results for input(s): "PROBNP" in the last 8760 hours.  CBG: No results for input(s): "GLUCAP" in the last 168 hours.  Radiological Exams on Admission: DG Chest Portable 1 View Result Date: 08/09/2023 CLINICAL DATA:  Dyspnea.  Shortness of breath and wheezing. EXAM: PORTABLE CHEST 1 VIEW COMPARISON:  05/29/2022 FINDINGS: Stable cardiac enlargement. Chronic asymmetric elevation of right hemidiaphragm. Mild diffuse interstitial prominence. No pleural effusion or consolidative change. IMPRESSION: 1. Cardiomegaly and mild diffuse interstitial prominence. 2. Chronic asymmetric elevation of right hemidiaphragm. Electronically Signed   By: Signa Kell M.D.   On: 08/09/2023 11:55   Assessment/Plan Glenda Boone is a 73 y.o. female with medical history significant for morbid obesity, bedbound, hypothyroidism, type 2 diabetes, COPD chronically on 2 L nasal cannula oxygen, heart failure with preserved EF being admitted to the hospital with acute on chronic hypoxia and suspected acute on chronic  heart failure with preserved EF.  Acute on chronic heart failure with preserved EF-suspect this is the reason for her acute on chronic hypoxia.  She also has elevated BNP, and appears fluid overloaded on exam. -Inpatient admission for telemetry monitoring -Carb modified heart healthy diet, with 2 L fluid restriction -Toprol-XL, Farxiga -Continue daily IV Lasix, monitor electrolytes daily -Will obtain 2D echo  COPD-without evidence of acute exacerbation, continue supplemental oxygen  Hypertension-continue Norvasc daily  Paroxysmal atrial fibrillation-continue beta-blocker, and Eliquis  Type 2 diabetes-carb modified diet, with moderate dose sliding scale  Depression-continue Wellbutrin, BuSpar  Hyperlipidemia-Lipitor  DVT prophylaxis: Eliquis    Code Status: Do not attempt resuscitation (DNR) PRE-ARREST INTERVENTIONS DESIRED  Consults called: None  Admission status: The appropriate patient status for this patient is INPATIENT. Inpatient status is judged to be reasonable and necessary in order to provide the required intensity of service to ensure the patient's safety. The patient's presenting symptoms, physical exam findings, and initial radiographic and laboratory data in the context of their chronic comorbidities is felt to place them at high risk for further clinical deterioration. Furthermore, it is not anticipated that the patient will be medically stable for discharge from the hospital within 2 midnights of admission.    I certify that at the point of admission it is my clinical judgment that the patient will require inpatient hospital care spanning beyond 2 midnights from the point of admission due to high intensity of service, high risk for further deterioration and high frequency of surveillance required  Time spent: 58 minutes  Ylianna Almanzar Sharlette Dense MD [facility] Pager [phone]  If 7PM-7AM, please contact night-coverage [email or web address] Password [email or web address]  08/09/2023,  1:13 PM

## 2023-08-09 NOTE — ED Provider Notes (Signed)
 [place] EMERGENCY DEPARTMENT AT [facility] Provider Note  CSN: [id number] Arrival date & time: 08/09/23 1103  Chief Complaint(s) Shortness of Breath  HPI Glenda Boone is a 73 y.o. female with PMH COPD, CHF on 2 L home O2, HTN, HLD, chronic A-fib who presents emergency room for evaluation of shortness of breath.  Arrives from skilled nursing facility with shortness of breath and wheezing started yesterday..  Significantly worsening lower extremity edema and patient endorsing orthopnea.  Denies chest pain, abdominal pain, nausea, vomiting, headache, fever or other systemic symptoms.  Patient arrives with tachypnea but no significant increased work of breathing.  Requiring 5 L nasal cannula to maintain oxygen saturation.  Past Medical History Past Medical History:  Diagnosis Date   Anxiety    Bronchitis    COPD (chronic obstructive pulmonary disease) (HCC)    Depression    Diabetes mellitus, type II (HCC)    Dyspnea    GERD (gastroesophageal reflux disease)    Hyperlipidemia    Hypertension    Hypothyroidism    Neuropathy    Ovarian cancer (HCC)    Shingles    Thyroid disease    Patient Active Problem List   Diagnosis Date Noted   Chronic atrial fibrillation (HCC) 05/30/2022   Closed fracture of left ankle 05/24/2022   Influenza A 05/24/2022   Acute on chronic hypoxic respiratory failure (HCC) 05/23/2022   COPD exacerbation (HCC) 03/28/2022   Acute on chronic heart failure with preserved ejection fraction (HCC) 03/28/2022   Acquired thrombophilia (HCC) 02/12/2020   Current use of long term anticoagulation 02/12/2020   Current every day smoker 02/12/2020   Atrial fibrillation with rapid ventricular response (HCC) 02/11/2020   Hyperthyroidism 02/11/2020   Pneumonia due to COVID-19 virus 01/31/2020   COPD (chronic obstructive pulmonary disease) (HCC) 01/31/2020   Essential hypertension 01/31/2020   Hypoxia 01/31/2020   Obesity, Class III, BMI 40-49.9 (morbid  obesity) (HCC) 01/31/2020   Acute respiratory failure with hypoxia (HCC)    Type 2 diabetes mellitus with hyperlipidemia (HCC)    Hypothyroidism    Acute metabolic encephalopathy 12/30/2019   TIA (transient ischemic attack) 09/04/2019   MDD (major depressive disorder), recurrent, in full remission (HCC) 04/25/2019   Aphagia 03/09/2019   Shingles outbreak 06/21/2015   Chronic bronchitis (HCC) 06/21/2015   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HCC) 11/28/2014   H/O diabetes mellitus 11/28/2014   Anxiety, generalized 11/28/2014   H/O hypercholesterolemia 11/28/2014   H/O: HTN (hypertension) 11/28/2014   H/O: hypothyroidism 11/28/2014   Depression, major, recurrent, moderate (HCC) 11/28/2014   H/O: obesity 11/28/2014   H/O disease 11/28/2014   Neuropathy 11/28/2014   Home Medication(s) Prior to Admission medications   Medication Sig Start Date End Date Taking? Authorizing Provider  acetaminophen (TYLENOL) 325 MG tablet Take 2 tablets (650 mg total) by mouth every 6 (six) hours as needed for mild pain (or Fever >/= 101). 04/03/22   Marolyn Haller, MD  albuterol (VENTOLIN HFA) 108 (90 Base) MCG/ACT inhaler Inhale 2 puffs into the lungs every 6 (six) hours as needed for wheezing or shortness of breath. 01/01/20   Almon Hercules, MD  amLODipine (NORVASC) 2.5 MG tablet Take 1 tablet (2.5 mg total) by mouth daily. 05/31/22   Nooruddin, Jason Fila, MD  apixaban (ELIQUIS) 5 MG TABS tablet Take 1 tablet (5 mg total) by mouth 2 (two) times daily. 02/16/20   Darlin Priestly, MD  ascorbic acid (VITAMIN C) 1000 MG tablet Take 1,000 mg by mouth daily. 05/22/22  [provider]  atorvastatin (LIPITOR) 20 MG tablet Take 1 tablet (20 mg total) by mouth daily. 05/31/22   Nooruddin, Jason Fila, MD  buPROPion ER [facility] SR) 100 MG 12 hr tablet Take 100 mg by mouth daily.    [provider]  busPIRone (BUSPAR) 10 MG tablet Take 1 tablet (10 mg total) by mouth 2 (two) times daily. 05/30/22   Nooruddin, Jason Fila, MD   dapagliflozin propanediol (FARXIGA) 10 MG TABS tablet Take 1 tablet (10 mg total) by mouth daily. 05/31/22   Nooruddin, Jason Fila, MD  furosemide (LASIX) 40 MG tablet Take 1 tablet (40 mg total) by mouth daily. 05/31/22   Nooruddin, Jason Fila, MD  levothyroxine (SYNTHROID) 200 MCG tablet Take 200 mcg by mouth daily before breakfast.    [provider]  Menthol, Topical Analgesic, (BIOFREEZE) 4 % GEL Apply 1 application  topically See admin instructions. Apply to affected area of the groin daily    [provider]  metoprolol succinate (TOPROL-XL) 25 MG 24 hr tablet Take 0.5 tablets (12.5 mg total) by mouth daily. Can titrate up if needed. 05/31/22   Nooruddin, Jason Fila, MD  Multiple Vitamin (MULTIVITAMIN) tablet Take 1 tablet by mouth daily.    [provider]  nystatin powder Apply 1 Application topically daily. Apply to L posterior knee topically every day shift for candidiasis. Apply to skin folds behind knees, abdominal fold and under breasts.    [provider]  OXYGEN Inhale 2 L/min into the lungs 3 (three) times daily. Every shift    [provider]  polyethylene glycol powder (MIRALAX) 17 GM/SCOOP powder Take 17 g by mouth daily.    [provider]  spironolactone (ALDACTONE) 25 MG tablet Take 25 mg by mouth daily.    [provider]  tiZANidine (ZANAFLEX) 2 MG tablet Take 2 mg by mouth daily as needed for muscle spasms.    [provider]  TRELEGY ELLIPTA 100-62.5-25 MCG/ACT AEPB Inhale 1 puff into the lungs daily.    [provider]                                                                                                                                    Past Surgical History Past Surgical History:  Procedure Laterality Date   ABDOMINAL HYSTERECTOMY     ANKLE SURGERY Left    CHOLECYSTECTOMY     OVARY SURGERY     SMALL INTESTINE SURGERY     TONSILLECTOMY     Family History Family History  Problem Relation Age of  Onset   Heart Problems Mother    Hypertension Mother    Colon cancer Father    Arthritis-Osteo Sister     Social History Social History   Tobacco Use   Smoking status: Every Day    Current packs/day: 1.00    Average packs/day: 1 pack/day for 43.6 years (43.6 ttl pk-yrs)    Types: Cigarettes  Start date: 12/19/1979   Smokeless tobacco: Never  Substance Use Topics   Alcohol use: No    Alcohol/week: 0.0 standard drinks of alcohol   Drug use: No   Allergies Codeine, Shellfish-derived products, Gabapentin, and Meperidine hcl  Review of Systems Review of Systems  Respiratory:  Positive for shortness of breath.   Cardiovascular:  Positive for leg swelling.    Physical Exam Vital Signs  I have reviewed the triage vital signs BP (!) 158/63   Pulse 62   Temp 97.7 F (36.5 C) (Oral)   Resp (!) 26   SpO2 100%   Physical Exam Vitals and nursing note reviewed.  Constitutional:      General: She is not in acute distress.    Appearance: She is well-developed.  HENT:     Head: Normocephalic and atraumatic.  Eyes:     Conjunctiva/sclera: Conjunctivae normal.  Cardiovascular:     Rate and Rhythm: Normal rate and regular rhythm.     Heart sounds: No murmur heard. Pulmonary:     Effort: Pulmonary effort is normal. No respiratory distress.     Breath sounds: Rales present.  Abdominal:     Palpations: Abdomen is soft.     Tenderness: There is no abdominal tenderness.  Musculoskeletal:        General: No swelling.     Cervical back: Neck supple.     Right lower leg: Edema present.     Left lower leg: Edema present.  Skin:    General: Skin is warm and dry.     Capillary Refill: Capillary refill takes less than 2 seconds.  Neurological:     Mental Status: She is alert.  Psychiatric:        Mood and Affect: Mood normal.     ED Results and Treatments Labs (all labs ordered are listed, but only abnormal results are displayed) Labs Reviewed  COMPREHENSIVE METABOLIC  PANEL - Abnormal; Notable for the following components:      Result Value   CO2 35 (*)    Glucose, Bld 193 (*)    BUN 26 (*)    Creatinine, Ser 1.33 (*)    Calcium 8.5 (*)    Albumin 3.3 (*)    AST 12 (*)    GFR, Estimated 42 (*)    All other components within normal limits  CBC WITH DIFFERENTIAL/PLATELET - Abnormal; Notable for the following components:   WBC 11.0 (*)    RBC 3.30 (*)    Hemoglobin 9.7 (*)    HCT 35.0 (*)    MCV 106.1 (*)    MCHC 27.7 (*)    Neutro Abs 9.1 (*)    All other components within normal limits  BRAIN NATRIURETIC PEPTIDE - Abnormal; Notable for the following components:   B Natriuretic Peptide 215.3 (*)    All other components within normal limits  TROPONIN I (HIGH SENSITIVITY)  Radiology DG Chest Portable 1 View Result Date: 08/09/2023 CLINICAL DATA:  Dyspnea.  Shortness of breath and wheezing. EXAM: PORTABLE CHEST 1 VIEW COMPARISON:  05/29/2022 FINDINGS: Stable cardiac enlargement. Chronic asymmetric elevation of right hemidiaphragm. Mild diffuse interstitial prominence. No pleural effusion or consolidative change. IMPRESSION: 1. Cardiomegaly and mild diffuse interstitial prominence. 2. Chronic asymmetric elevation of right hemidiaphragm. Electronically Signed   By: Signa Kell M.D.   On: 08/09/2023 11:55    Pertinent labs & imaging results that were available during my care of the patient were reviewed by me and considered in my medical decision making (see MDM for details).  Medications Ordered in ED Medications  furosemide (LASIX) injection 40 mg (40 mg Intravenous Given 08/09/23 1157)                                                                                                                                     Procedures .Critical Care  Performed by: Glendora Score, MD Authorized by: Glendora Score, MD   Critical  care provider statement:    Critical care time (minutes):  30   Critical care was necessary to treat or prevent imminent or life-threatening deterioration of the following conditions:  Respiratory failure   Critical care was time spent personally by me on the following activities:  Development of treatment plan with patient or surrogate, discussions with consultants, evaluation of patient's response to treatment, examination of patient, ordering and review of laboratory studies, ordering and review of radiographic studies, ordering and performing treatments and interventions, pulse oximetry, re-evaluation of patient's condition and review of old charts   (including critical care time)  Medical Decision Making / ED Course   This patient presents to the ED for concern of shortness of breath, this involves an extensive number of treatment options, and is a complaint that carries with it a high risk of complications and morbidity.  The differential diagnosis includes Pe, PTX, Pulmonary Edema, ARDS, COPD/Asthma, ACS, CHF exacerbation, Arrhythmia, Pericardial Effusion/Tamponade, Anemia, Sepsis, Acidosis/Hypercapnia, Anxiety, Viral URI  MDM: Patient seen emergency room for evaluation of shortness of breath.  Physical exam with rales at the bases, significant 2+ pitting edema, tachypnea but is otherwise unremarkable.  Laboratory evaluation with a leukocytosis to 11.0, hemoglobin 9.7, BUN 26, creatinine 1.33, albumin 3.3, BNP 215, chest x-ray with cardiomegaly and vascular congestion.  Lasix initiated.  Patient with increased oxygen requirement and will require hospital admission for diuresis given respiratory failure secondary to CHF.  Patient admitted   Additional history obtained:  -External records from outside source obtained and reviewed including: Chart review including previous notes, labs, imaging, consultation notes   Lab Tests: -I ordered, reviewed, and interpreted labs.   The pertinent  results include:   Labs Reviewed  COMPREHENSIVE METABOLIC PANEL - Abnormal; Notable for the following components:      Result Value   CO2 35 (*)    Glucose, Bld 193 (*)  BUN 26 (*)    Creatinine, Ser 1.33 (*)    Calcium 8.5 (*)    Albumin 3.3 (*)    AST 12 (*)    GFR, Estimated 42 (*)    All other components within normal limits  CBC WITH DIFFERENTIAL/PLATELET - Abnormal; Notable for the following components:   WBC 11.0 (*)    RBC 3.30 (*)    Hemoglobin 9.7 (*)    HCT 35.0 (*)    MCV 106.1 (*)    MCHC 27.7 (*)    Neutro Abs 9.1 (*)    All other components within normal limits  BRAIN NATRIURETIC PEPTIDE - Abnormal; Notable for the following components:   B Natriuretic Peptide 215.3 (*)    All other components within normal limits  TROPONIN I (HIGH SENSITIVITY)      EKG   EKG Interpretation Date/Time:  Sunday August 09 2023 11:12:07 EDT Ventricular Rate:  60 PR Interval:    QRS Duration:  89 QT Interval:  469 QTC Calculation: 461 R Axis:   52  Text Interpretation: Atrial fibrillation Probable anteroseptal infarct, old Confirmed by Lurene Robley [phone]) on 08/09/2023 8:38:47 PM         Imaging Studies ordered: I ordered imaging studies including chest x-ray I independently visualized and interpreted imaging. I agree with the radiologist interpretation   Medicines ordered and prescription drug management: Meds ordered this encounter  Medications   furosemide (LASIX) injection 40 mg    -I have reviewed the patients home medicines and have made adjustments as needed  Critical interventions Supplemental oxygen, Lasix   Cardiac Monitoring: The patient was maintained on a cardiac monitor.  I personally viewed and interpreted the cardiac monitored which showed an underlying rhythm of: A-fib  Social Determinants of Health:  Factors impacting patients care include: Lives in skilled nursing facility   Reevaluation: After the interventions noted above, I  reevaluated the patient and found that they have :improved  Co morbidities that complicate the patient evaluation  Past Medical History:  Diagnosis Date   Anxiety    Bronchitis    COPD (chronic obstructive pulmonary disease) (HCC)    Depression    Diabetes mellitus, type II (HCC)    Dyspnea    GERD (gastroesophageal reflux disease)    Hyperlipidemia    Hypertension    Hypothyroidism    Neuropathy    Ovarian cancer (HCC)    Shingles    Thyroid disease       Dispostion: I considered admission for this patient, and patient will require hospital admission for shortness of breath and CHF exacerbation with increasing oxygen requirements     Final Clinical Impression(s) / ED Diagnoses Final diagnoses:  None     @PCDICTATION @    Glendora Score, MD 08/09/23 2039

## 2023-08-09 NOTE — ED Triage Notes (Addendum)
 Pt arrives from [facility] via EMS co sob and wheezing that started yesterday. Hx COPD, CHF, takes lasix. Wears 2L O2 at baseline, EMS placed on 5L for transport. Pt satting 97% on 4L at this time. Edema noted to bilateral legs, pt states its worse than her normal.

## 2023-08-10 ENCOUNTER — Inpatient Hospital Stay (HOSPITAL_COMMUNITY)

## 2023-08-10 DIAGNOSIS — I5033 Acute on chronic diastolic (congestive) heart failure: Secondary | ICD-10-CM

## 2023-08-10 DIAGNOSIS — J9621 Acute and chronic respiratory failure with hypoxia: Secondary | ICD-10-CM

## 2023-08-10 DIAGNOSIS — J449 Chronic obstructive pulmonary disease, unspecified: Secondary | ICD-10-CM | POA: Diagnosis not present

## 2023-08-10 DIAGNOSIS — J9602 Acute respiratory failure with hypercapnia: Secondary | ICD-10-CM | POA: Diagnosis not present

## 2023-08-10 DIAGNOSIS — I5031 Acute diastolic (congestive) heart failure: Secondary | ICD-10-CM | POA: Diagnosis not present

## 2023-08-10 DIAGNOSIS — N1832 Chronic kidney disease, stage 3b: Secondary | ICD-10-CM

## 2023-08-10 DIAGNOSIS — G9341 Metabolic encephalopathy: Secondary | ICD-10-CM

## 2023-08-10 DIAGNOSIS — J9622 Acute and chronic respiratory failure with hypercapnia: Secondary | ICD-10-CM

## 2023-08-10 DIAGNOSIS — I480 Paroxysmal atrial fibrillation: Secondary | ICD-10-CM

## 2023-08-10 DIAGNOSIS — D649 Anemia, unspecified: Secondary | ICD-10-CM

## 2023-08-10 LAB — ECHOCARDIOGRAM COMPLETE
AR max vel: 2.3 cm2
AV Area VTI: 2.78 cm2
AV Area mean vel: 2.57 cm2
AV Mean grad: 7 mmHg
AV Peak grad: 15.5 mmHg
Ao pk vel: 1.97 m/s
Area-P 1/2: 3.63 cm2
MV VTI: 1.99 cm2
S' Lateral: 2.35 cm

## 2023-08-10 LAB — RESPIRATORY PANEL BY PCR

## 2023-08-10 LAB — BLOOD GAS, ARTERIAL
Acid-Base Excess: 6.9 mmol/L — ABNORMAL HIGH (ref 0.0–2.0)
Acid-Base Excess: 8.1 mmol/L — ABNORMAL HIGH (ref 0.0–2.0)
Bicarbonate: 40.6 mmol/L — ABNORMAL HIGH (ref 20.0–28.0)
Bicarbonate: 40 mmol/L — ABNORMAL HIGH (ref 20.0–28.0)
Delivery systems: POSITIVE
Drawn by: 20012
Drawn by: 31394
Expiratory PAP: 8 cmH2O
FIO2: 0.8 %
Inspiratory PAP: 18 cmH2O
O2 Saturation: 93.7 %
O2 Saturation: 95.5 %
Patient temperature: 36.6
Patient temperature: 36.3
RATE: 20 {breaths}/min
pCO2 arterial: 123 mmHg (ref 32–48)
pCO2 arterial: 97 mmHg (ref 32–48)
pH, Arterial: 7.13 — CL (ref 7.35–7.45)
pH, Arterial: 7.22 — ABNORMAL LOW (ref 7.35–7.45)
pO2, Arterial: 69 mmHg — ABNORMAL LOW (ref 83–108)
pO2, Arterial: 66 mmHg — ABNORMAL LOW (ref 83–108)

## 2023-08-10 LAB — TSH: TSH: 3.493 u[IU]/mL (ref 0.350–4.500)

## 2023-08-10 LAB — CBC
HCT: 35.6 % — ABNORMAL LOW (ref 36.0–46.0)
Hemoglobin: 9.8 g/dL — ABNORMAL LOW (ref 12.0–15.0)
MCH: 30.2 pg (ref 26.0–34.0)
MCHC: 27.5 g/dL — ABNORMAL LOW (ref 30.0–36.0)
MCV: 109.5 fL — ABNORMAL HIGH (ref 80.0–100.0)
Platelets: 260 10*3/uL (ref 150–400)
RBC: 3.25 MIL/uL — ABNORMAL LOW (ref 3.87–5.11)
RDW: 15.4 % (ref 11.5–15.5)
WBC: 11.8 10*3/uL — ABNORMAL HIGH (ref 4.0–10.5)
nRBC: 0 % (ref 0.0–0.2)

## 2023-08-10 LAB — BASIC METABOLIC PANEL
Anion gap: 7 (ref 5–15)
BUN: 29 mg/dL — ABNORMAL HIGH (ref 8–23)
CO2: 34 mmol/L — ABNORMAL HIGH (ref 22–32)
Calcium: 8.3 mg/dL — ABNORMAL LOW (ref 8.9–10.3)
Chloride: 100 mmol/L (ref 98–111)
Creatinine, Ser: 1.39 mg/dL — ABNORMAL HIGH (ref 0.44–1.00)
GFR, Estimated: 40 mL/min — ABNORMAL LOW (ref >60)
Glucose, Bld: 182 mg/dL — ABNORMAL HIGH (ref 70–99)
Potassium: 4.9 mmol/L (ref 3.5–5.1)
Sodium: 141 mmol/L (ref 135–145)

## 2023-08-10 LAB — GLUCOSE, CAPILLARY
Glucose-Capillary: 165 mg/dL — ABNORMAL HIGH (ref 70–99)
Glucose-Capillary: 170 mg/dL — ABNORMAL HIGH (ref 70–99)
Glucose-Capillary: 170 mg/dL — ABNORMAL HIGH (ref 70–99)
Glucose-Capillary: 137 mg/dL — ABNORMAL HIGH (ref 70–99)
Glucose-Capillary: 99 mg/dL (ref 70–99)
Glucose-Capillary: 84 mg/dL (ref 70–99)

## 2023-08-10 LAB — T4, FREE: Free T4: 0.89 ng/dL (ref 0.61–1.12)

## 2023-08-10 LAB — MRSA NEXT GEN BY PCR, NASAL: MRSA by PCR Next Gen: NOT DETECTED

## 2023-08-10 LAB — RESP PANEL BY RT-PCR (RSV, FLU A&B, COVID)  RVPGX2
Influenza A by PCR: NEGATIVE
Influenza B by PCR: NEGATIVE
Resp Syncytial Virus by PCR: NEGATIVE
SARS Coronavirus 2 by RT PCR: NEGATIVE

## 2023-08-10 MED ORDER — FUROSEMIDE 10 MG/ML IJ SOLN
80.0000 mg | Freq: Three times a day (TID) | INTRAMUSCULAR | Status: DC
  Administered 2023-08-10 – 2023-08-14 (×12): 80 mg via INTRAVENOUS
  Filled 2023-08-10 (×12): qty 8

## 2023-08-10 MED ORDER — FAMOTIDINE IN NACL 20-0.9 MG/50ML-% IV SOLN
20.0000 mg | INTRAVENOUS | Status: DC
  Administered 2023-08-10 – 2023-08-12 (×3): 20 mg via INTRAVENOUS
  Filled 2023-08-10 (×3): qty 50

## 2023-08-10 MED ORDER — ARFORMOTEROL TARTRATE 15 MCG/2ML IN NEBU
15.0000 ug | INHALATION_SOLUTION | Freq: Two times a day (BID) | RESPIRATORY_TRACT | Status: DC
  Administered 2023-08-10 – 2023-08-17 (×15): 15 ug via RESPIRATORY_TRACT
  Filled 2023-08-10 (×15): qty 2

## 2023-08-10 MED ORDER — ORAL CARE MOUTH RINSE: 15.0000 mL | OROMUCOSAL | Status: DC | PRN

## 2023-08-10 MED ORDER — CHLORHEXIDINE GLUCONATE CLOTH 2 % EX PADS
6.0000 | MEDICATED_PAD | Freq: Every day | CUTANEOUS | Status: DC
  Administered 2023-08-10 – 2023-08-17 (×8): 6 via TOPICAL

## 2023-08-10 MED ORDER — HEPARIN (PORCINE) 25000 UT/250ML-% IV SOLN
1650.0000 [IU]/h | INTRAVENOUS | Status: DC
  Administered 2023-08-10 – 2023-08-12 (×3): 1450 [IU]/h via INTRAVENOUS
  Filled 2023-08-10 (×3): qty 250

## 2023-08-10 MED ORDER — INSULIN ASPART 100 UNIT/ML IJ SOLN
0.0000 [IU] | INTRAMUSCULAR | Status: DC
  Administered 2023-08-10: 3 [IU] via SUBCUTANEOUS
  Administered 2023-08-10 – 2023-08-11 (×3): 2 [IU] via SUBCUTANEOUS
  Administered 2023-08-12 (×2): 3 [IU] via SUBCUTANEOUS
  Administered 2023-08-12: 2 [IU] via SUBCUTANEOUS
  Administered 2023-08-12: 5 [IU] via SUBCUTANEOUS
  Administered 2023-08-12: 2 [IU] via SUBCUTANEOUS
  Administered 2023-08-13 – 2023-08-14 (×4): 3 [IU] via SUBCUTANEOUS
  Administered 2023-08-14: 2 [IU] via SUBCUTANEOUS
  Administered 2023-08-14: 5 [IU] via SUBCUTANEOUS
  Administered 2023-08-14: 2 [IU] via SUBCUTANEOUS
  Administered 2023-08-14: 3 [IU] via SUBCUTANEOUS
  Administered 2023-08-14 – 2023-08-15 (×3): 5 [IU] via SUBCUTANEOUS
  Administered 2023-08-15: 2 [IU] via SUBCUTANEOUS
  Administered 2023-08-16: 5 [IU] via SUBCUTANEOUS
  Administered 2023-08-16: 2 [IU] via SUBCUTANEOUS
  Administered 2023-08-16 (×2): 3 [IU] via SUBCUTANEOUS
  Administered 2023-08-17: 2 [IU] via SUBCUTANEOUS
  Administered 2023-08-17: 3 [IU] via SUBCUTANEOUS

## 2023-08-10 MED ORDER — BUDESONIDE 0.25 MG/2ML IN SUSP
0.2500 mg | Freq: Two times a day (BID) | RESPIRATORY_TRACT | Status: DC
  Administered 2023-08-10 – 2023-08-17 (×15): 0.25 mg via RESPIRATORY_TRACT
  Filled 2023-08-10 (×15): qty 2

## 2023-08-10 MED ORDER — REVEFENACIN 175 MCG/3ML IN SOLN
175.0000 ug | Freq: Every day | RESPIRATORY_TRACT | Status: DC
  Administered 2023-08-10 – 2023-08-17 (×8): 175 ug via RESPIRATORY_TRACT
  Filled 2023-08-10 (×7): qty 3

## 2023-08-10 MED ORDER — ORAL CARE MOUTH RINSE
15.0000 mL | OROMUCOSAL | Status: DC
  Administered 2023-08-10 – 2023-08-17 (×22): 15 mL via OROMUCOSAL

## 2023-08-10 NOTE — Plan of Care (Signed)
   Problem: Education: Goal: Knowledge of General Education information will improve Description: Including pain rating scale, medication(s)/side effects and non-pharmacologic comfort measures Outcome: Progressing   Problem: Clinical Measurements: Goal: Will remain free from infection Outcome: Progressing

## 2023-08-10 NOTE — Progress Notes (Signed)
   08/10/23 1029  BiPAP/CPAP/SIPAP  $ Non-Invasive Ventilator  Non-Invasive Vent Set Up;Non-Invasive Vent Initial  $ Face Mask Medium Yes  BiPAP/CPAP/SIPAP Pt Type Adult  BiPAP/CPAP/SIPAP (S)  V60 (IPAP: 18/8,  RR 20, 100%.)  Mask Type Full face mask  Set Rate 20 breaths/min  Respiratory Rate 24 breaths/min  Flow Rate 1 lpm  Patient Home Equipment No  Auto Titrate No  Press High Alarm 25 cmH2O  Press Low Alarm 5 cmH2O  Nasal massage performed No (comment)  CPAP/SIPAP surface wiped down Yes  Oxygen Percent 100 %  BiPAP/CPAP /SiPAP Vitals  Pulse Rate 66  Resp 19  SpO2 (!) 85 % (Currently increasing, bipap just applied, 100% FI02.)  MEWS Score/Color  MEWS Score 1  MEWS Score Color Green

## 2023-08-10 NOTE — IPAL (Signed)
  Interdisciplinary Goals of Care Family Meeting   Date carried out: 08/10/2023  Location of the meeting: Phone conference  Member's involved: Nurse Practitioner and Family Member or next of kin  Durable Power of Attorney or acting medical decision maker:  I spoke to the sister Glenda Boone she is in [place] and on her way here sometime this afternoon.  We discussed Glenda Boone's progressive respiratory failure, failure to respond significantly to NIPPV, and options for further intervention. We specifically discussed short term intubation. I shared with her my concern that once intubated her sister would be at high risk for requiring prolonged ventilation and perhaps even tracheostomy. She was very clear that she does NOT want intubation for her sister and has requested we continue on as DNR and DNI  Discussion: We discussed goals of care for [name] .  See above  Code status:   Code Status: Limited: Do not attempt resuscitation (DNR) -DNR-LIMITED -Do Not Intubate/DNI    Disposition: Continue current acute care But no further escalation. If she worsens we may be looking at transition to comfort.  Time spent for the meeting: 22 min     Shelby Mattocks, NP  08/10/2023, 1:15 PM

## 2023-08-10 NOTE — Progress Notes (Signed)
 Pt increasing lethargic this a.m.  Responds when shaken and spoke to loudly but falls right back asleep.  Md in room and aware. ABG ordered for possible BIPAP. Nino Parsley

## 2023-08-10 NOTE — Progress Notes (Signed)
  Echocardiogram 2D Echocardiogram has been performed.  Glenda Boone 08/10/2023, 9:21 AM

## 2023-08-10 NOTE — Progress Notes (Signed)
 PROGRESS NOTE    Glenda Boone  AVW:[id number] DOB: Oct 27, 1950 DOA: 08/09/2023 PCP: [facility]   Brief Narrative: Glenda Boone is a 73 y.o. female with a history of morbid obesity, bedbound, hypothyroidism, diabetes mellitus type 2, COPD, chronic respiratory failure on 3 L via nasal cannula, heart failure with preserved EF, CKD stage IIIb, depression, hyperlipidemia, paroxysmal atrial fibrillation.  Patient presented secondary to shortness of breath and leg swelling with concern for possible acute heart failure.  Patient started on IV Lasix.  Echocardiogram obtained which is significant for preserved LVEF and grade 2 diastolic dysfunction.  During hospitalization, patient was noted to have increasing lethargy requiring significant oxygen with irregular respiratory pattern.  ABG obtained and was significant for evidence of acute hypercapnia with associated acidemia.  Patient transferred to ICU, started on BiPAP, PCCM consulted.   Assessment and Plan:  Acute on chronic HFpEF BNP of 215.3 with chest imaging suggestive of possible fluid. Transthoracic Echocardiogram ordered and is significant for LVEF of 70% with associated grade 2 diastolic dysfunction.  Right ventricular function is normal. -Continue Lasix IV -Daily BMP while on Lasix IV -Strict in/out and daily weights  Acute on chronic respiratory failure with hypoxia Acute respiratory failure with hypercapnia Patient uses 2 L/min of supplemental oxygen at baseline. Patient requiring up to non-rebreather this admission. Initial concern was secondary to acute heart failure, however, unclear if this is the complete picture. Afebrile. Mild leukocytosis. Patient noted to be lethargic and slow to respond with irregular breaths. ABG ordered stat and is significant for pH 7.13, pCO2 123, pO2 69 and bicarbonate of 40.6. Concern patient likely has some obstructive/restrictive pulmonary disease contributing to  presentation. -Transfer to ICU -Manage heart failure -Continuous pulse ox -BiPAP -PCCM consult for possible need for intubation and mechanical ventilation -Wean to 2 L/min as able -Ambulatory pulse ox daily -Check COVID-19/influenza/RSV and RVP  COPD No evidence of exacerbation at this time. Possibly contributing to above presentation. -albuterol PRN  Hypothyroidism -Continue levothyroxine 200 mcg daily  Primary hypertension -Continue amlodipine  Paroxysmal atrial fibrillation Patient is currently in sinus rhythm.  -Continue Toprol XL and Eliquis  Diabetes mellitus type 2 Well controlled for age with last hemoglobin A1C of 7.8%. Patient is on Farxiga as an outpatient. -Continue carb modified diet -Continue SSI  Acute macrocytic anemia Unclear etiology. -Check anemia panel  CKD stage IIIb Stable.  Depression -Continue Wellbutrin and BuSpar  Hyperlipidemia -Continue Lipitor  Obesity, class III No current weight, however body habitus consistent with at least obesity class III.   DVT prophylaxis: Eliquis Code Status:   Code Status: Do not attempt resuscitation (DNR) PRE-ARREST INTERVENTIONS DESIRED Family Communication: None at bedside. Attempted to call sister, but went straight to voicemail (not set up) Disposition Plan: Transfer to ICU   Consultants:  PCCM  Procedures:  BiPAP  Antimicrobials: None    Subjective: Patient lethargic but answers briefly that she has no breathing issues.  Objective: BP (!) 141/56 (BP Location: Left Arm)   Pulse 60   Temp 97.9 F (36.6 C)   Resp 16   SpO2 96%   Examination:  General exam: Appears calm and comfortable Respiratory system: Diminished on anterior auscultation. Irregular respirations with increased accessory muscle usage. Cardiovascular system: S1 & S2 heard, RRR. Gastrointestinal system: Abdomen is nondistended, soft and nontender. Normal bowel sounds heard. Central nervous system: Lethargic but  arouses briefly to answer some simple questions. Not following commands Skin: No cyanosis. No rashes   Data  Reviewed: I have personally reviewed following labs and imaging studies  CBC Lab Results  Component Value Date   WBC 11.8 (H) 08/10/2023   RBC 3.25 (L) 08/10/2023   HGB 9.8 (L) 08/10/2023   HCT 35.6 (L) 08/10/2023   MCV 109.5 (H) 08/10/2023   MCH 30.2 08/10/2023   PLT 260 08/10/2023   MCHC 27.5 (L) 08/10/2023   RDW 15.4 08/10/2023   LYMPHSABS 1.0 08/09/2023   MONOABS 0.6 08/09/2023   EOSABS 0.2 08/09/2023   BASOSABS 0.1 08/09/2023     Last metabolic panel Lab Results  Component Value Date   NA 141 08/10/2023   K 4.9 08/10/2023   CL 100 08/10/2023   CO2 34 (H) 08/10/2023   BUN 29 (H) 08/10/2023   CREATININE 1.39 (H) 08/10/2023   GLUCOSE 182 (H) 08/10/2023   GFRNONAA 40 (L) 08/10/2023   GFRAA 60 (L) 02/15/2020   CALCIUM 8.3 (L) 08/10/2023   PROT 7.0 08/09/2023   ALBUMIN 3.3 (L) 08/09/2023   BILITOT 0.5 08/09/2023   ALKPHOS 100 08/09/2023   AST 12 (L) 08/09/2023   ALT 14 08/09/2023   ANIONGAP 7 08/10/2023    GFR: CrCl cannot be calculated (Unknown ideal weight.).  No results found for this or any previous visit (from the past 240 hours).    Radiology Studies: DG Chest Portable 1 View Result Date: 08/09/2023 CLINICAL DATA:  Dyspnea.  Shortness of breath and wheezing. EXAM: PORTABLE CHEST 1 VIEW COMPARISON:  05/29/2022 FINDINGS: Stable cardiac enlargement. Chronic asymmetric elevation of right hemidiaphragm. Mild diffuse interstitial prominence. No pleural effusion or consolidative change. IMPRESSION: 1. Cardiomegaly and mild diffuse interstitial prominence. 2. Chronic asymmetric elevation of right hemidiaphragm. Electronically Signed   By: Signa Kell M.D.   On: 08/09/2023 11:55      LOS: 1 day   CRITICAL CARE Performed by: Jacquelin Hawking   Total critical care time: 45 minutes  Critical care time was exclusive of separately billable procedures and  treating other patients.  Critical care was necessary to treat or prevent imminent or life-threatening deterioration.  Critical care was time spent personally by me on the following activities: development of treatment plan with patient and/or surrogate as well as nursing, discussions with consultants, evaluation of patient's response to treatment, examination of patient, obtaining history from patient or surrogate, ordering and performing treatments and interventions, ordering and review of laboratory studies, ordering and review of radiographic studies, pulse oximetry and re-evaluation of patient's condition.  Jacquelin Hawking, MD [facility] 08/10/2023, 7:44 AM   If 7PM-7AM, please contact night-coverage [email or web address]

## 2023-08-10 NOTE — Progress Notes (Signed)
   08/10/23 2009  BiPAP/CPAP/SIPAP  BiPAP/CPAP/SIPAP Pt Type Adult  BiPAP/CPAP/SIPAP V60  Mask Type Full face mask  Dentures removed? Not applicable  Mask Size Medium  Set Rate 16 breaths/min  Respiratory Rate 23 breaths/min  IPAP 18 cmH20  EPAP 8 cmH2O  FiO2 (%) 60 %  Flow Rate 0.85 lpm  Minute Ventilation 11.5  Leak 12  Peak Inspiratory Pressure (PIP) 18  Tidal Volume (Vt) 539  Patient Home Equipment No  Auto Titrate No  Press High Alarm 25 cmH2O  Press Low Alarm 5 cmH2O  BiPAP/CPAP /SiPAP Vitals  Bilateral Breath Sounds Diminished

## 2023-08-10 NOTE — Progress Notes (Signed)
 More awake. Remains encephalopathic on NIPPV but actively trying to communicate now.   Sister at bedside. Updated on current findings and plan discussed/confirmed.

## 2023-08-10 NOTE — Congregational Nurse Program (Signed)
 Report called to [facility], ICU RN. Pt transported to ICU via bed with respiratory monitoring BIPAP. Nino Parsley

## 2023-08-10 NOTE — Hospital Course (Signed)
 Glenda Boone is a 73 y.o. female with a history of morbid obesity, bedbound, hypothyroidism, diabetes mellitus type 2, COPD, chronic respiratory failure on 3 L via nasal cannula, heart failure with preserved EF, CKD stage IIIb, depression, hyperlipidemia, paroxysmal atrial fibrillation.  Patient presented secondary to shortness of breath and leg swelling with concern for possible acute heart failure.  Patient started on IV Lasix.  Echocardiogram obtained which is significant for preserved LVEF and grade 2 diastolic dysfunction.  During hospitalization, patient was noted to have increasing lethargy requiring significant oxygen with irregular respiratory pattern.  ABG obtained and was significant for evidence of acute hypercapnia with associated acidemia.  Patient transferred to ICU, started on BiPAP, PCCM consulted.

## 2023-08-10 NOTE — Progress Notes (Signed)
 PHARMACY - ANTICOAGULATION CONSULT NOTE  Pharmacy Consult for Heparin Indication: atrial fibrillation  Allergies  Allergen Reactions   Codeine Nausea Only and Other (See Comments)    "Allergic," per verbal MAR   Shellfish-Derived Products Hives and Other (See Comments)    "Allergic," per verbal MAR   Gabapentin Other (See Comments)    Gives nightmares- "Allergic," per verbal MAR   Meperidine Hcl Anxiety and Other (See Comments)    "Allergic," per verbal MAR    Patient Measurements: Height: 5\' 4"  (162.6 cm) Weight: (!) 163.4 kg (360 lb 3.7 oz) IBW/kg (Calculated) : 54.7 Heparin Dosing Weight: 97 kg  Vital Signs: Temp: 97.3 F (36.3 C) (03/17 1201) Temp Source: Axillary (03/17 1201) BP: 185/101 (03/17 1300) Pulse Rate: 59 (03/17 1300)  Labs: Recent Labs    08/09/23 1123 08/09/23 1339 08/10/23 0429  HGB 9.7*  --  9.8*  HCT 35.0*  --  35.6*  PLT 250  --  260  CREATININE 1.33*  --  1.39*  TROPONINIHS 6 7  --     Estimated Creatinine Clearance: 55 mL/min (A) (by C-G formula based on SCr of 1.39 mg/dL (H)).   Medical History: Past Medical History:  Diagnosis Date   Anxiety    Bronchitis    COPD (chronic obstructive pulmonary disease) (HCC)    Depression    Diabetes mellitus, type II (HCC)    Dyspnea    GERD (gastroesophageal reflux disease)    Hyperlipidemia    Hypertension    Hypothyroidism    Neuropathy    Ovarian cancer (HCC)    Shingles    Thyroid disease     Medications:  Scheduled:   arformoterol  15 mcg Nebulization BID   budesonide (PULMICORT) nebulizer solution  0.25 mg Nebulization BID   Chlorhexidine Gluconate Cloth  6 each Topical Daily   furosemide  80 mg Intravenous Q8H   insulin aspart  0-15 Units Subcutaneous Q4H   mouth rinse  15 mL Mouth Rinse 4 times per day   revefenacin  175 mcg Nebulization Daily   topiramate  50 mg Oral QHS   Infusions:   famotidine (PEPCID) IV 20 mg (08/10/23 1320)    Assessment: 88 yoF admitted on 3/16  with suspected CHF, transferred to ICU on 3/17 with respiratory failure, AECOPD.  PMH includes Afib on chronic apixaban anticoagulation.  Pharmacy is consulted to dose heparin IV while apixaban is held.   Last dose apixaban on 3/16 at ~2200  SCr up to 1.39 CBC:  Hgb low/stable at 9.8, Plt WNL   Goal of Therapy:  Heparin level 0.3-0.7 units/ml aPTT 66-102 seconds Monitor platelets by anticoagulation protocol: Yes   Plan:  No bolus due to recent apixaban use. Start heparin IV infusion at 1450 units/hr APTT, heparin level 8 hours after starting Daily aPTT, heparin level, and CBC   Lynann Beaver PharmD, BCPS [facility] main pharmacy [phone] 08/10/2023 1:37 PM

## 2023-08-10 NOTE — Consult Note (Addendum)
 NAME:  [name], MRN:  [id number], DOB:  1950/09/28, LOS: 1 ADMISSION DATE:  08/09/2023, CONSULTATION DATE:  08/10/23 REFERRING MD:  Dr. Caleb Popp, CHIEF COMPLAINT:  Shortness of breath   History of Present Illness:  Glenda Boone is a 73 year old female presenting with shortness of breath and increased leg swelling. She lives at a SNF and is bed bound at baseline. Over the last three days before admission. She has been noticing increased leg swelling. Her lasix was increased but the swelling was not improved. 3/15 she started noticing some shortness of breath and her oxygen was increased to 4L [place]. She was brought to the ED via EMS from her facility on 3/16.   Upon presentation to the ED she was found to be fluid overloaded. ED labs were remarkable for bicarb 35, creatinine 1.33, BNP 215.3, WBC 11, HGB 9.7. Chest x ray showed chronic asymmetric elevation of right hemidiaphragm. , She was given 40 IV lasix and admitted to medicine.    Echo showed EF 70-75% with normal right heart function  3/17 she began to have decreased level on consciousness with increased oxygen needs. ABG showed severe hypercarbia, with hypoxia and pH of 7.13. PCCM was consulted for hypercarbic and hypoxic respiratory failure.   Pertinent  Medical History  COPD on 2L [place], HFpEF, morbid obesity, DM type 2, hypertension, hypothyroidism, paroxysmal a-fib on eliquis  Significant Hospital Events: Including procedures, antibiotic start and stop dates in addition to other pertinent events   3/16 admitted with suspected acute on chronic heart failure 3/17 PCCM consulted for hypercarbic and hypoxic resp failure likely related to COPD exacerbation  Interim History / Subjective:  Briefly arouses to touch then immediately goes back to sleep. Severely increased respiratory effort with accessory muscle use on NRB. Able to speak one word at a time. Oriented to place.   Objective   Blood pressure (!) 129/54, pulse (!) 58, temperature  98.8 F (37.1 C), temperature source Oral, resp. rate 20, SpO2 92%.        Intake/Output Summary (Last 24 hours) at 08/10/2023 1009 Last data filed at 08/10/2023 2130 Gross per 24 hour  Intake 500 ml  Output 200 ml  Net 300 ml   There were no vitals filed for this visit.  Examination: General: Obese, ill appearing, in acute distress HENT: NCAT Lungs: on non-rebreather. Bilateral basilar expiratory wheeze. Decreased air movement. Accessory muscle use Cardiovascular: RRR, brady. JVD present  Abdomen: soft nontender Extremities: +4 bilateral LE edema with right leg skin changes.  Neuro: arousable to touch. Oriented to place. Able to say one word before going back to sleep.   Resolved Hospital Problem list     Assessment & Plan:  Acute on chronic hypercarbic and hypoxic respiratory failure secondary to acute diastolic heart failure w/ volume overload  underlying COPD as well as un treated probable Obesity hypoventilation syndrome & OSA Acute decompensation with pH 7.13, pCO2 123, pO2 69, Bicarb 40.6. AMS. WBC stable at 11.8. chest x ray with no acute abnormality.  Plan Resp panel pending Bipap Cont to push diuresis  Repeat ABG on Bipap; reaching out to family re: mechanical ventilation  Transfer to ICU Scheduled BDs and ICS Chest x-ray Avoid sedating meds  Acute metabolic encephalopathy most likely 2/2 hypercarbia Plan Supportive care as above Limit sedating meds Checking TSH and free t4  Acute on chronic HFpEF Echo hyperdynamic with Grade II diastolic dysfunction. Pt appears fluid overloaded with LE edema, JVD, and BNP 215 Plan  Increase frequency of lasix to q6 Strict I & O monitoring Repeat chest x-ray as above  Hold home PO meds for now (farxiga, metoprolol) Add back GDMT as able   Paroxsymal a-fib on eliquis Currently sinus brady Plan Hold eliquis Start heparin gtt Rate control   Hx type 2 diabetes with hyperglycemia A1c 7.8. CBGs mostly within  goal Plan Continue SSI moderate scale with HS coverage Goal 140-180  Hypothyriodism Plan Check TSH and free t4  CKD (stage 3b) Creatinine 1.39, currently at baseline Plan Renal dose meds Avoid nephrotoxic agents  Anemia Hgb 9.8, stable Plan Transfuse for hgb <7 or symptomatic with signs of bleeding  Best Practice (right click and "Reselect all SmartList Selections" daily)   Diet/type: NPO DVT prophylaxis systemic heparin Pressure ulcer(s): N/A GI prophylaxis: H2B Lines: N/A Foley:  N/A Code Status:  DNR Last date of multidisciplinary goals of care discussion [n/a]  Labs   CBC: Recent Labs  Lab 08/09/23 1123 08/10/23 0429  WBC 11.0* 11.8*  NEUTROABS 9.1*  --   HGB 9.7* 9.8*  HCT 35.0* 35.6*  MCV 106.1* 109.5*  PLT 250 260    Basic Metabolic Panel: Recent Labs  Lab 08/09/23 1123 08/10/23 0429  NA 141 141  K 4.8 4.9  CL 100 100  CO2 35* 34*  GLUCOSE 193* 182*  BUN 26* 29*  CREATININE 1.33* 1.39*  CALCIUM 8.5* 8.3*   GFR: CrCl cannot be calculated (Unknown ideal weight.). Recent Labs  Lab 08/09/23 1123 08/10/23 0429  WBC 11.0* 11.8*    Liver Function Tests: Recent Labs  Lab 08/09/23 1123  AST 12*  ALT 14  ALKPHOS 100  BILITOT 0.5  PROT 7.0  ALBUMIN 3.3*   No results for input(s): "LIPASE", "AMYLASE" in the last 168 hours. No results for input(s): "AMMONIA" in the last 168 hours.  ABG    Component Value Date/Time   PHART 7.13 (LL) 08/10/2023 0938   PCO2ART 123 (HH) 08/10/2023 0938   PO2ART 69 (L) 08/10/2023 0938   HCO3 40.6 (H) 08/10/2023 0938   O2SAT 93.7 08/10/2023 0938     Coagulation Profile: No results for input(s): "INR", "PROTIME" in the last 168 hours.  Cardiac Enzymes: No results for input(s): "CKTOTAL", "CKMB", "CKMBINDEX", "TROPONINI" in the last 168 hours.  HbA1C: Hgb A1c MFr Bld  Date/Time Value Ref Range Status  08/09/2023 01:39 PM 7.8 (H) 4.8 - 5.6 % Final    Comment:    (NOTE) Pre diabetes:           5.7%-6.4%  Diabetes:              >6.4%  Glycemic control for   <7.0% adults with diabetes   03/28/2022 05:09 PM 7.3 (H) 4.8 - 5.6 % Final    Comment:    (NOTE) Pre diabetes:          5.7%-6.4%  Diabetes:              >6.4%  Glycemic control for   <7.0% adults with diabetes     CBG: Recent Labs  Lab 08/09/23 1617 08/09/23 2217 08/10/23 0734  GLUCAP 138* 193* 165*    Review of Systems:   Unable to assess  Past Medical History:  She,  has a past medical history of Anxiety, Bronchitis, COPD (chronic obstructive pulmonary disease) (HCC), Depression, Diabetes mellitus, type II (HCC), Dyspnea, GERD (gastroesophageal reflux disease), Hyperlipidemia, Hypertension, Hypothyroidism, Neuropathy, Ovarian cancer (HCC), Shingles, and Thyroid disease.   Surgical History:   Past Surgical  History:  Procedure Laterality Date   ABDOMINAL HYSTERECTOMY     ANKLE SURGERY Left    CHOLECYSTECTOMY     OVARY SURGERY     SMALL INTESTINE SURGERY     TONSILLECTOMY       Social History:   reports that she has been smoking cigarettes. She started smoking about 43 years ago. She has a 43.6 pack-year smoking history. She has never used smokeless tobacco. She reports that she does not drink alcohol and does not use drugs.   Family History:  Her family history includes Arthritis-Osteo in her sister; Colon cancer in her father; Heart Problems in her mother; Hypertension in her mother.   Allergies Allergies  Allergen Reactions   Codeine Nausea Only and Other (See Comments)    "Allergic," per verbal MAR   Shellfish-Derived Products Hives and Other (See Comments)    "Allergic," per verbal MAR   Gabapentin Other (See Comments)    Gives nightmares- "Allergic," per verbal MAR   Meperidine Hcl Anxiety and Other (See Comments)    "Allergic," per verbal MAR     Home Medications  Prior to Admission medications   Medication Sig Start Date End Date Taking? Authorizing Provider  acetaminophen  (TYLENOL) 325 MG tablet Take 2 tablets (650 mg total) by mouth every 6 (six) hours as needed for mild pain (or Fever >/= 101). 04/03/22  Yes Marolyn Haller, MD  albuterol (VENTOLIN HFA) 108 (90 Base) MCG/ACT inhaler Inhale 2 puffs into the lungs every 6 (six) hours as needed for wheezing or shortness of breath. 01/01/20  Yes Almon Hercules, MD  amLODipine (NORVASC) 2.5 MG tablet Take 1 tablet (2.5 mg total) by mouth daily. 05/31/22  Yes Nooruddin, Jason Fila, MD  apixaban (ELIQUIS) 5 MG TABS tablet Take 1 tablet (5 mg total) by mouth 2 (two) times daily. 02/16/20  Yes Darlin Priestly, MD  atorvastatin (LIPITOR) 20 MG tablet Take 1 tablet (20 mg total) by mouth daily. 05/31/22  Yes Nooruddin, Jason Fila, MD  barrier cream (NON-SPECIFIED) CREA Apply 1 Application topically 2 (two) times daily. To labia and groin area for irritation  and with all incontinent care.   Yes [provider]  buPROPion ER (WELLBUTRIN SR) 100 MG 12 hr tablet Take 100 mg by mouth daily.   Yes [provider]  busPIRone (BUSPAR) 7.5 MG tablet Take 7.5 mg by mouth 2 (two) times daily.   Yes [provider]  dapagliflozin propanediol (FARXIGA) 10 MG TABS tablet Take 1 tablet (10 mg total) by mouth daily. 05/31/22  Yes Nooruddin, Jason Fila, MD  furosemide (LASIX) 40 MG tablet Take 1 tablet (40 mg total) by mouth daily. 05/31/22  Yes Nooruddin, Jason Fila, MD  ibuprofen (ADVIL) 200 MG tablet Take 400 mg by mouth daily as needed for moderate pain (pain score 4-6).   Yes [provider]  levothyroxine (SYNTHROID) 200 MCG tablet Take 200 mcg by mouth daily before breakfast.   Yes [provider]  lidocaine 4 % Place 1 patch onto the skin daily.   Yes [provider]  metoprolol succinate (TOPROL-XL) 25 MG 24 hr tablet Take 0.5 tablets (12.5 mg total) by mouth daily. Can titrate up if needed. 05/31/22  Yes Nooruddin, Jason Fila, MD  OXYGEN Inhale 2 L/min into the lungs continuous. Every shift   Yes [provider]   polyethylene glycol powder (MIRALAX) 17 GM/SCOOP powder Take 17 g by mouth daily as needed for moderate constipation.   Yes [provider]  tetrahydrozoline 0.05 %  ophthalmic solution Place 2 drops into both eyes.   Yes [provider]  tiZANidine (ZANAFLEX) 2 MG tablet Take 2 mg by mouth daily as needed for muscle spasms.   Yes [provider]  topiramate (TOPAMAX) 50 MG tablet Take 50 mg by mouth at bedtime.   Yes [provider]  busPIRone (BUSPAR) 10 MG tablet Take 1 tablet (10 mg total) by mouth 2 (two) times daily. Patient not taking: Reported on 08/09/2023 05/30/22   NooruddinJason Fila, MD     Critical care time:  35 min

## 2023-08-10 NOTE — Progress Notes (Signed)
 Attempted report. RN will call back when available.  Room not clean at present. Nino Parsley

## 2023-08-11 ENCOUNTER — Inpatient Hospital Stay (HOSPITAL_COMMUNITY)

## 2023-08-11 DIAGNOSIS — I480 Paroxysmal atrial fibrillation: Secondary | ICD-10-CM | POA: Diagnosis not present

## 2023-08-11 DIAGNOSIS — J9621 Acute and chronic respiratory failure with hypoxia: Secondary | ICD-10-CM | POA: Diagnosis not present

## 2023-08-11 DIAGNOSIS — J9622 Acute and chronic respiratory failure with hypercapnia: Secondary | ICD-10-CM | POA: Diagnosis not present

## 2023-08-11 DIAGNOSIS — I5033 Acute on chronic diastolic (congestive) heart failure: Secondary | ICD-10-CM | POA: Diagnosis not present

## 2023-08-11 DIAGNOSIS — I5031 Acute diastolic (congestive) heart failure: Secondary | ICD-10-CM | POA: Diagnosis not present

## 2023-08-11 DIAGNOSIS — J449 Chronic obstructive pulmonary disease, unspecified: Secondary | ICD-10-CM | POA: Diagnosis not present

## 2023-08-11 LAB — CBC
HCT: 32.4 % — ABNORMAL LOW (ref 36.0–46.0)
Hemoglobin: 9 g/dL — ABNORMAL LOW (ref 12.0–15.0)
MCH: 29.7 pg (ref 26.0–34.0)
MCHC: 27.8 g/dL — ABNORMAL LOW (ref 30.0–36.0)
MCV: 106.9 fL — ABNORMAL HIGH (ref 80.0–100.0)
Platelets: 227 10*3/uL (ref 150–400)
RBC: 3.03 MIL/uL — ABNORMAL LOW (ref 3.87–5.11)
RDW: 15.2 % (ref 11.5–15.5)
WBC: 9.6 10*3/uL (ref 4.0–10.5)
nRBC: 0 % (ref 0.0–0.2)

## 2023-08-11 LAB — GLUCOSE, CAPILLARY
Glucose-Capillary: 103 mg/dL — ABNORMAL HIGH (ref 70–99)
Glucose-Capillary: 106 mg/dL — ABNORMAL HIGH (ref 70–99)
Glucose-Capillary: 112 mg/dL — ABNORMAL HIGH (ref 70–99)
Glucose-Capillary: 127 mg/dL — ABNORMAL HIGH (ref 70–99)
Glucose-Capillary: 132 mg/dL — ABNORMAL HIGH (ref 70–99)
Glucose-Capillary: 145 mg/dL — ABNORMAL HIGH (ref 70–99)

## 2023-08-11 LAB — BASIC METABOLIC PANEL
Anion gap: 10 (ref 5–15)
BUN: 30 mg/dL — ABNORMAL HIGH (ref 8–23)
CO2: 37 mmol/L — ABNORMAL HIGH (ref 22–32)
Calcium: 8 mg/dL — ABNORMAL LOW (ref 8.9–10.3)
Chloride: 98 mmol/L (ref 98–111)
Creatinine, Ser: 1.32 mg/dL — ABNORMAL HIGH (ref 0.44–1.00)
GFR, Estimated: 42 mL/min — ABNORMAL LOW (ref >60)
Glucose, Bld: 113 mg/dL — ABNORMAL HIGH (ref 70–99)
Potassium: 3.9 mmol/L (ref 3.5–5.1)
Sodium: 145 mmol/L (ref 135–145)

## 2023-08-11 LAB — C DIFFICILE QUICK SCREEN W PCR REFLEX
C Diff antigen: NEGATIVE
C Diff interpretation: NOT DETECTED
C Diff toxin: NEGATIVE

## 2023-08-11 LAB — HEPARIN LEVEL (UNFRACTIONATED): Heparin Unfractionated: >1.1 [IU]/mL — ABNORMAL HIGH (ref 0.30–0.70)

## 2023-08-11 LAB — RETICULOCYTES
Immature Retic Fract: 30.5 % — ABNORMAL HIGH (ref 2.3–15.9)
RBC.: 2.94 MIL/uL — ABNORMAL LOW (ref 3.87–5.11)
Retic Count, Absolute: 115 10*3/uL (ref 19.0–186.0)
Retic Ct Pct: 3.9 % — ABNORMAL HIGH (ref 0.4–3.1)

## 2023-08-11 LAB — FOLATE: Folate: 10.2 ng/mL (ref >5.9)

## 2023-08-11 LAB — IRON AND TIBC
Iron: 25 ug/dL — ABNORMAL LOW (ref 28–170)
Saturation Ratios: 11 % (ref 10.4–31.8)
TIBC: 237 ug/dL — ABNORMAL LOW (ref 250–450)
UIBC: 212 ug/dL

## 2023-08-11 LAB — ALBUMIN: Albumin: 2.8 g/dL — ABNORMAL LOW (ref 3.5–5.0)

## 2023-08-11 LAB — VITAMIN B12: Vitamin B-12: 183 pg/mL (ref 180–914)

## 2023-08-11 LAB — FERRITIN: Ferritin: 50 ng/mL (ref 11–307)

## 2023-08-11 LAB — APTT
aPTT: 78 s — ABNORMAL HIGH (ref 24–36)
aPTT: 89 s — ABNORMAL HIGH (ref 24–36)

## 2023-08-11 MED ORDER — POTASSIUM CHLORIDE CRYS ER 20 MEQ PO TBCR
40.0000 meq | EXTENDED_RELEASE_TABLET | Freq: Once | ORAL | Status: AC
  Administered 2023-08-11: 40 meq via ORAL
  Filled 2023-08-11: qty 2

## 2023-08-11 MED ORDER — METOPROLOL SUCCINATE ER 25 MG PO TB24
12.5000 mg | ORAL_TABLET | Freq: Every day | ORAL | Status: DC
  Administered 2023-08-11 – 2023-08-17 (×7): 12.5 mg via ORAL
  Filled 2023-08-11 (×7): qty 1

## 2023-08-11 MED ORDER — GERHARDT'S BUTT CREAM
TOPICAL_CREAM | Freq: Two times a day (BID) | CUTANEOUS | Status: DC
Start: 2023-08-11 — End: 2023-08-17
  Administered 2023-08-11: 1 via TOPICAL
  Filled 2023-08-11 (×3): qty 60

## 2023-08-11 MED ORDER — ACETAMINOPHEN 650 MG RE SUPP: 650.0000 mg | RECTAL | Status: AC | PRN

## 2023-08-11 MED ORDER — ACETAMINOPHEN 325 MG PO TABS
650.0000 mg | ORAL_TABLET | Freq: Four times a day (QID) | ORAL | Status: AC | PRN
  Administered 2023-08-15: 650 mg via ORAL
  Filled 2023-08-11: qty 2

## 2023-08-11 MED ORDER — METOPROLOL TARTRATE 5 MG/5ML IV SOLN
2.5000 mg | Freq: Once | INTRAVENOUS | Status: AC | PRN
  Administered 2023-08-11: 2.5 mg via INTRAVENOUS
  Filled 2023-08-11: qty 5

## 2023-08-11 MED ORDER — NYSTATIN 100000 UNIT/GM EX POWD
Freq: Two times a day (BID) | CUTANEOUS | Status: DC
  Filled 2023-08-11 (×3): qty 15

## 2023-08-11 MED ORDER — STERILE WATER FOR INJECTION IJ SOLN
INTRAMUSCULAR | Status: AC
  Administered 2023-08-11: 10 mL
  Filled 2023-08-11: qty 10

## 2023-08-11 MED ORDER — OLANZAPINE 10 MG IM SOLR
5.0000 mg | Freq: Once | INTRAMUSCULAR | Status: AC
  Administered 2023-08-11: 5 mg via INTRAVENOUS
  Filled 2023-08-11: qty 10

## 2023-08-11 MED ORDER — METOPROLOL TARTRATE 5 MG/5ML IV SOLN: 5.0000 mg | Freq: Once | INTRAVENOUS | Status: DC

## 2023-08-11 MED ORDER — SODIUM CHLORIDE 0.9% FLUSH
3.0000 mL | INTRAVENOUS | Status: DC | PRN
  Administered 2023-08-12 (×2): 3 mL via INTRAVENOUS

## 2023-08-11 NOTE — Plan of Care (Signed)
  Problem: Metabolic: Goal: Ability to maintain appropriate glucose levels will improve Outcome: Progressing   Problem: Elimination: Goal: Will not experience complications related to urinary retention Outcome: Progressing   Problem: Pain Managment: Goal: General experience of comfort will improve and/or be controlled Outcome: Progressing   Problem: Safety: Goal: Ability to remain free from injury will improve Outcome: Progressing

## 2023-08-11 NOTE — Progress Notes (Signed)
 Attempted to reach out to sister today for update.  Using same number as yesterday  Went to Voice mail box that was not set up  -nursing has spoken w/ sister earlier today -will try to get updated phone info as she is planning on visiting later this evening

## 2023-08-11 NOTE — Progress Notes (Signed)
 NAME:  NEALY HICKMON, MRN:  [id number], DOB:  Nov 27, 1950, LOS: 2 ADMISSION DATE:  08/09/2023, CONSULTATION DATE:  08/10/23 REFERRING MD:  Dr. Caleb Popp, CHIEF COMPLAINT:  Shortness of breath   History of Present Illness:  Monzerrath Mcburney is a 73 year old female presenting with shortness of breath and increased leg swelling. She lives at a SNF and is bed bound at baseline. Over the last three days before admission. She has been noticing increased leg swelling. Her lasix was increased but the swelling was not improved. 3/15 she started noticing some shortness of breath and her oxygen was increased to 4L [place]. She was brought to the ED via EMS from her facility on 3/16.   Upon presentation to the ED she was found to be fluid overloaded. ED labs were remarkable for bicarb 35, creatinine 1.33, BNP 215.3, WBC 11, HGB 9.7. Chest x ray showed chronic asymmetric elevation of right hemidiaphragm. , She was given 40 IV lasix and admitted to medicine.    Echo showed EF 70-75% with normal right heart function  3/17 she began to have decreased level on consciousness with increased oxygen needs. ABG showed severe hypercarbia, with hypoxia and pH of 7.13. PCCM was consulted for hypercarbic and hypoxic respiratory failure.   Pertinent  Medical History  COPD on 2L [place], HFpEF, morbid obesity, DM type 2, hypertension, hypothyroidism, paroxysmal a-fib on eliquis  Significant Hospital Events: Including procedures, antibiotic start and stop dates in addition to other pertinent events   3/16 admitted with suspected acute on chronic heart failure 3/17 PCCM consulted for hypercarbic and hypoxic resp failure likely related to decomp OSA/OHS. Placed on NIPPV. Diuretics escalated. Hypercarbia improved moderately w/ PCO2 123-->97 w/ this her mental status improved. Still had asterixis. GOC explored w/ sister. Verified DNR and also confirmed DNI.  3/18 delirium continued. Got dose of Olanzapine. Lab work unremarkable. AF w/ RVR this  am. Resuming BB. Had low grade temp. Cultures sent   Interim History / Subjective:  More awake.   Objective   Blood pressure (!) 126/54, pulse (!) 133, temperature 98.7 F (37.1 C), temperature source Axillary, resp. rate 19, height 5\' 4"  (1.626 m), weight (!) 163.4 kg, SpO2 98%.    FiO2 (%):  [50 %-100 %] 50 %   Intake/Output Summary (Last 24 hours) at 08/11/2023 0745 Last data filed at 08/11/2023 0600 Gross per 24 hour  Intake 232.78 ml  Output 4150 ml  Net -3917.22 ml   Filed Weights   08/10/23 1201  Weight: (!) 163.4 kg    Examination: General 73 year old female laying in bed no distress but confused this am  HENT BIPAP mask in place.  Pulm diffuse wheezing. No accessory use on NIPPV. PCXR low vol w/ vascular congestion  Card irreg irreg w/ RVR up to 130s.  Ext marked pitting edema ble warm strong pulses Neuro awake, confused. Moves all ext. Still having asterixis  Gu cl yellow   Resolved Hospital Problem list     Assessment & Plan:  Acute on chronic hypercarbic and hypoxic respiratory failure secondary to acute diastolic heart failure w/ volume overload  underlying COPD as well as un treated probable Obesity hypoventilation syndrome & OSA -RVP neg -I&O now negative 3.6 liters Plan Cont to push diuretics as long as BP/BUN/cr allow  Will cont mandatory HS bipap and PRN during day. Think she will need this at dc (may be a barrier if difficult to arrange), but also need to be sure she will  be compliant otherwise I think we are looking at hospice  Cont Scheduled BDs and ICS Avoid sedating meds  Paroxsymal a-fib on eliquis Was bradycardic 3/17 now in AF w/ RVR Plan Cont heparin gtt for now. If taking POs well over course of day switch back to oral DOAC in am 3/19 Got IV lopressor  Resume oral lopressor if can take PO  Acute metabolic encephalopathy most likely 2/2 hypercarbia and element of hospital delirium  Thyroid studies nml Plan Supportive care as  above  Acute on chronic HFpEF Echo hyperdynamic with Grade II diastolic dysfunction. Pt appears fluid overloaded with LE edema, JVD, and BNP 215 Plan Cont escalated diuretic regimen  Hold home PO meds for now (farxiga, metoprolol) Add back GDMT as able   CKD (stage 3b) Creatinine 1.39, currently at baseline Plan Renal dose meds Avoid nephrotoxic agents  Low grade temp, no leukocytosis. No obvious infection  Plan Cont to monitor  F/u pending cultures  Hx type 2 diabetes with hyperglycemia A1c 7.8. CBGs mostly within goal Plan Continue SSI moderate scale q4 Goal 140-180  Hypothyriodism Plan Resume oral synthroid when able. We can hold off from IV for now (would only consider if off her synthroid for > 4-5d and only been on hold since 3/17)  Anemia Hgb 9.8, stable Plan Transfuse for hgb <7 or symptomatic with signs of bleeding  Best Practice (right click and "Reselect all SmartList Selections" daily)   Diet/type: NPO DVT prophylaxis systemic heparin Pressure ulcer(s): N/A GI prophylaxis: H2B Lines: N/A Foley:  N/A Code Status:  DNR & DNI see IPAL note 3/17 Last date of multidisciplinary goals of care discussion [n/a]  Critical care time:  NA my time 38 min

## 2023-08-11 NOTE — Progress Notes (Signed)
 Sister at bedside Updated on plan  She is hoping that social work can look into care closer to her home in [place].   Simonne Martinet ACNP-BC [facility] Pulmonary/Critical Care Pager # [phone] OR # [phone] if no answer

## 2023-08-11 NOTE — Progress Notes (Signed)
 PROGRESS NOTE    Glenda Boone  JYN:[id number] DOB: 1950-06-14 DOA: 08/09/2023 PCP: [facility]   Brief Narrative: Glenda Boone is a 73 y.o. female with a history of morbid obesity, bedbound, hypothyroidism, diabetes mellitus type 2, COPD, chronic respiratory failure on 3 L via nasal cannula, heart failure with preserved EF, CKD stage IIIb, depression, hyperlipidemia, paroxysmal atrial fibrillation.  Patient presented secondary to shortness of breath and leg swelling with concern for possible acute heart failure.  Patient started on IV Lasix.  Echocardiogram obtained which is significant for preserved LVEF and grade 2 diastolic dysfunction.  During hospitalization, patient was noted to have increasing lethargy requiring significant oxygen with irregular respiratory pattern.  ABG obtained and was significant for evidence of acute hypercapnia with associated acidemia.  Patient transferred to ICU, started on BiPAP, PCCM consulted.   Assessment and Plan:  Acute on chronic HFpEF BNP of 215.3 with chest imaging suggestive of possible fluid. Transthoracic Echocardiogram ordered and is significant for LVEF of 70% with associated grade 2 diastolic dysfunction.  Right ventricular function is normal. -Continue Lasix IV; dose increased per pulmonology -Daily BMP while on Lasix IV -Strict in/out and daily weights  Acute on chronic respiratory failure with hypoxia Acute respiratory failure with hypercapnia Patient uses 2 L/min of supplemental oxygen at baseline. Patient requiring up to non-rebreather this admission. Initial concern was secondary to acute heart failure, however, unclear if this is the complete picture. Afebrile. Mild leukocytosis. Patient noted to be lethargic and slow to respond with irregular breaths. ABG ordered stat and is significant for pH 7.13, pCO2 123, pO2 69 and bicarbonate of 40.6. Concern patient likely has some obstructive/restrictive pulmonary  disease contributing to presentation. Patient transferred to ICU on 3/17 but has improved on BiPAP. Patient is bedbound at baseline. -Transfer to ICU -Manage heart failure -Continuous pulse ox -BiPAP qHS -Wean to 2 L/min as able  Fever Unclear etiology. RVP, COVID-19, RSV, influenza negative. Patient has developed several episodes of watery stools -Check blood cultures -Check repeat chest x-ray -Check C. difficile  COPD No evidence of exacerbation at this time. Possibly contributing to above presentation. -albuterol PRN  Hypothyroidism -Continue levothyroxine 200 mcg daily  Primary hypertension -Continue amlodipine  Paroxysmal atrial fibrillation Patient is currently in sinus rhythm.  -Continue Toprol XL and Eliquis  Diabetes mellitus type 2 Well controlled for age with last hemoglobin A1C of 7.8%. Patient is on Farxiga as an outpatient. -Continue carb modified diet -Continue SSI  Acute macrocytic anemia Unclear etiology. -Check anemia panel  CKD stage IIIb Stable.  Depression -Continue Wellbutrin and BuSpar  Hyperlipidemia -Continue Lipitor  Obesity, class III No current weight, however body habitus consistent with at least obesity class III.   DVT prophylaxis: Eliquis Code Status:   Code Status: Limited: Do not attempt resuscitation (DNR) -DNR-LIMITED -Do Not Intubate/DNI  Family Communication: None at bedside. Disposition Plan: Transfer to stepdown unit. Anticipate discharge back to facility pending ability to wean oxygen to baseline and likely set up for NIV need   Consultants:  PCCM  Procedures:  BiPAP  Antimicrobials: None    Subjective: Patient with no concerns this morning. Breathing okay.  Objective: BP (!) 158/64   Pulse 76   Temp 98.7 F (37.1 C) (Axillary)   Resp (!) 23   Ht 5\' 4"  (1.626 m)   Wt (!) 163.4 kg   SpO2 (!) 86%   BMI 61.83 kg/m   Examination:  General exam: Appears calm and comfortable Respiratory  system: Clear  to auscultation. Respiratory effort normal. Cardiovascular system: S1 & S2 heard, RRR. No murmurs. Gastrointestinal system: Abdomen is nondistended, soft and nontender. Normal bowel sounds heard. Central nervous system: Alert and oriented to person.   Data Reviewed: I have personally reviewed following labs and imaging studies  CBC Lab Results  Component Value Date   WBC 9.6 08/11/2023   RBC 2.94 (L) 08/11/2023   RBC 3.03 (L) 08/11/2023   HGB 9.0 (L) 08/11/2023   HCT 32.4 (L) 08/11/2023   MCV 106.9 (H) 08/11/2023   MCH 29.7 08/11/2023   PLT 227 08/11/2023   MCHC 27.8 (L) 08/11/2023   RDW 15.2 08/11/2023   LYMPHSABS 1.0 08/09/2023   MONOABS 0.6 08/09/2023   EOSABS 0.2 08/09/2023   BASOSABS 0.1 08/09/2023     Last metabolic panel Lab Results  Component Value Date   NA 145 08/11/2023   K 3.9 08/11/2023   CL 98 08/11/2023   CO2 37 (H) 08/11/2023   BUN 30 (H) 08/11/2023   CREATININE 1.32 (H) 08/11/2023   GLUCOSE 113 (H) 08/11/2023   GFRNONAA 42 (L) 08/11/2023   GFRAA 60 (L) 02/15/2020   CALCIUM 8.0 (L) 08/11/2023   PROT 7.0 08/09/2023   ALBUMIN 2.8 (L) 08/11/2023   BILITOT 0.5 08/09/2023   ALKPHOS 100 08/09/2023   AST 12 (L) 08/09/2023   ALT 14 08/09/2023   ANIONGAP 10 08/11/2023    GFR: Estimated Creatinine Clearance: 58 mL/min (A) (by C-G formula based on SCr of 1.32 mg/dL (H)).  Recent Results (from the past 240 hours)  Resp panel by RT-PCR (RSV, Flu A&B, Covid) Anterior Nasal Swab     Status: None   Collection Time: 08/10/23 10:34 AM   Specimen: Anterior Nasal Swab  Result Value Ref Range Status   SARS Coronavirus 2 by RT PCR NEGATIVE NEGATIVE Final    Comment: (NOTE) SARS-CoV-2 target nucleic acids are NOT DETECTED.  The SARS-CoV-2 RNA is generally detectable in upper respiratory specimens during the acute phase of infection. The lowest concentration of SARS-CoV-2 viral copies this assay can detect is 138 copies/mL. A negative result does not preclude  SARS-Cov-2 infection and should not be used as the sole basis for treatment or other patient management decisions. A negative result may occur with  improper specimen collection/handling, submission of specimen other than nasopharyngeal swab, presence of viral mutation(s) within the areas targeted by this assay, and inadequate number of viral copies(<138 copies/mL). A negative result must be combined with clinical observations, patient history, and epidemiological information. The expected result is Negative.  Fact Sheet for Patients:  BloggerCourse.com  Fact Sheet for Healthcare Providers:  SeriousBroker.it  This test is no t yet approved or cleared by the [place] FDA and  has been authorized for detection and/or diagnosis of SARS-CoV-2 by FDA under an Emergency Use Authorization (EUA). This EUA will remain  in effect (meaning this test can be used) for the duration of the COVID-19 declaration under Section 564(b)(1) of the Act, 21 U.S.C.section 360bbb-3(b)(1), unless the authorization is terminated  or revoked sooner.       Influenza A by PCR NEGATIVE NEGATIVE Final   Influenza B by PCR NEGATIVE NEGATIVE Final    Comment: (NOTE) The Xpert Xpress SARS-CoV-2/FLU/RSV plus assay is intended as an aid in the diagnosis of influenza from Nasopharyngeal swab specimens and should not be used as a sole basis for treatment. Nasal washings and aspirates are unacceptable for Xpert Xpress SARS-CoV-2/FLU/RSV testing.  Fact Sheet for Patients:  BloggerCourse.com  Fact Sheet for Healthcare Providers: SeriousBroker.it  This test is not yet approved or cleared by the [place] FDA and has been authorized for detection and/or diagnosis of SARS-CoV-2 by FDA under an Emergency Use Authorization (EUA). This EUA will remain in effect (meaning this test can be used) for the duration of  the COVID-19 declaration under Section 564(b)(1) of the Act, 21 U.S.C. section 360bbb-3(b)(1), unless the authorization is terminated or revoked.     Resp Syncytial Virus by PCR NEGATIVE NEGATIVE Final    Comment: (NOTE) Fact Sheet for Patients: BloggerCourse.com  Fact Sheet for Healthcare Providers: SeriousBroker.it  This test is not yet approved or cleared by the [place] FDA and has been authorized for detection and/or diagnosis of SARS-CoV-2 by FDA under an Emergency Use Authorization (EUA). This EUA will remain in effect (meaning this test can be used) for the duration of the COVID-19 declaration under Section 564(b)(1) of the Act, 21 U.S.C. section 360bbb-3(b)(1), unless the authorization is terminated or revoked.  Performed at [facility], [address] [place], [place] [address]   Respiratory (~20 pathogens) panel by PCR     Status: None   Collection Time: 08/10/23 10:34 AM   Specimen: Nasopharyngeal Swab; Respiratory  Result Value Ref Range Status   Adenovirus NOT DETECTED NOT DETECTED Final   Coronavirus 229E NOT DETECTED NOT DETECTED Final    Comment: (NOTE) The Coronavirus on the Respiratory Panel, DOES NOT test for the novel  Coronavirus (2019 nCoV)    Coronavirus HKU1 NOT DETECTED NOT DETECTED Final   Coronavirus NL63 NOT DETECTED NOT DETECTED Final   Coronavirus OC43 NOT DETECTED NOT DETECTED Final   Metapneumovirus NOT DETECTED NOT DETECTED Final   Rhinovirus / Enterovirus NOT DETECTED NOT DETECTED Final   Influenza A NOT DETECTED NOT DETECTED Final   Influenza B NOT DETECTED NOT DETECTED Final   Parainfluenza Virus 1 NOT DETECTED NOT DETECTED Final   Parainfluenza Virus 2 NOT DETECTED NOT DETECTED Final   Parainfluenza Virus 3 NOT DETECTED NOT DETECTED Final   Parainfluenza Virus 4 NOT DETECTED NOT DETECTED Final   Respiratory Syncytial Virus NOT DETECTED NOT DETECTED Final    Bordetella pertussis NOT DETECTED NOT DETECTED Final   Bordetella Parapertussis NOT DETECTED NOT DETECTED Final   Chlamydophila pneumoniae NOT DETECTED NOT DETECTED Final   Mycoplasma pneumoniae NOT DETECTED NOT DETECTED Final    Comment: Performed at [facility], [address] [place], [place] [address]  MRSA Next Gen by PCR, Nasal     Status: None   Collection Time: 08/10/23 11:31 AM   Specimen: Nasal Mucosa; Nasal Swab  Result Value Ref Range Status   MRSA by PCR Next Gen NOT DETECTED NOT DETECTED Final    Comment: (NOTE) The GeneXpert MRSA Assay (FDA approved for NASAL specimens only), is one component of a comprehensive MRSA colonization surveillance program. It is not intended to diagnose MRSA infection nor to guide or monitor treatment for MRSA infections. Test performance is not FDA approved in patients less than 59 years old. Performed at [facility], [address] [place], [place] [address]   Culture, blood (Routine X 2) w Reflex to ID Panel     Status: None (Preliminary result)   Collection Time: 08/11/23  8:44 AM   Specimen: BLOOD LEFT ARM  Result Value Ref Range Status   Specimen Description   Final    BLOOD LEFT ARM Performed at [facility], [address] [place], [place] [address]    Special Requests   Final    BOTTLES DRAWN AEROBIC AND ANAEROBIC Blood Culture adequate volume Performed at [facility], [address] [place], [place] [address]    Culture PENDING  Incomplete   Report Status PENDING  Incomplete  Culture, blood (Routine X 2) w Reflex to ID Panel     Status: None (Preliminary result)   Collection Time: 08/11/23  8:44 AM   Specimen: BLOOD RIGHT HAND  Result Value Ref Range Status   Specimen Description   Final    BLOOD RIGHT HAND Performed at [facility], [address] [place], [place] [address]    Special Requests   Final    BOTTLES DRAWN AEROBIC ONLY Blood Culture results may  not be optimal due to an inadequate volume of blood received in culture bottles Performed at [facility], [address], [place], [place] [address]    Culture PENDING  Incomplete   Report Status PENDING  Incomplete      Radiology Studies: ECHOCARDIOGRAM COMPLETE Result Date: 08/10/2023    ECHOCARDIOGRAM REPORT   Patient Name:   Glenda Boone Date of Exam: 08/10/2023 Medical Rec #:  [id number]       Height:       64.0 in Accession #:    [id number]      Weight:       341.0 lb Date of Birth:  Oct 26, 1950       BSA:          2.454 m Patient Age:    73 years        BP:           141/56 mmHg Patient Gender: F               HR:           51 bpm. Exam Location:  Inpatient Procedure: 2D Echo, Cardiac Doppler and Color Doppler (Both Spectral and Color            Flow Doppler were utilized during procedure). Indications:    CHF- Acute Diastolic  History:        Patient has prior history of Echocardiogram examinations, most                 recent 05/28/2021. Risk Factors:Hypertension, Diabetes and Current                 Smoker.  Sonographer:    Karma Ganja Referring Phys: [id number] MIR [facility]  Sonographer Comments: Patient is obese and Technically challenging study due to limited acoustic windows. IMPRESSIONS  1. Left ventricular ejection fraction, by estimation, is 70 to 75%. The left ventricle has hyperdynamic function. The left ventricle has no regional wall motion abnormalities. There is moderate concentric left ventricular hypertrophy. Left ventricular diastolic parameters are consistent with Grade II diastolic dysfunction (pseudonormalization).  2. Right ventricular systolic function is normal. The right ventricular size is normal.  3. Left atrial size was moderately dilated.  4. The mitral valve is normal in structure. Trivial mitral valve regurgitation. No evidence of mitral stenosis.  5. The aortic valve is normal in structure. Aortic valve regurgitation is not visualized. No aortic  stenosis is present.  6. The inferior vena cava is dilated in size with <50% respiratory variability, suggesting right atrial pressure of 15 mmHg. FINDINGS  Left Ventricle: Left ventricular ejection fraction, by estimation, is 70 to 75%. The left ventricle  has hyperdynamic function. The left ventricle has no regional wall motion abnormalities. The left ventricular internal cavity size was normal in size. There is moderate concentric left ventricular hypertrophy. Left ventricular diastolic parameters are consistent with Grade II diastolic dysfunction (pseudonormalization). Right Ventricle: The right ventricular size is normal. No increase in right ventricular wall thickness. Right ventricular systolic function is normal. Left Atrium: Left atrial size was moderately dilated. Right Atrium: Right atrial size was normal in size. Pericardium: There is no evidence of pericardial effusion. Mitral Valve: The mitral valve is normal in structure. Mild mitral annular calcification. Trivial mitral valve regurgitation. No evidence of mitral valve stenosis. MV peak gradient, 11.8 mmHg. The mean mitral valve gradient is 3.0 mmHg. Tricuspid Valve: The tricuspid valve is normal in structure. Tricuspid valve regurgitation is trivial. No evidence of tricuspid stenosis. Aortic Valve: The aortic valve is normal in structure. Aortic valve regurgitation is not visualized. No aortic stenosis is present. Aortic valve mean gradient measures 7.0 mmHg. Aortic valve peak gradient measures 15.5 mmHg. Aortic valve area, by VTI measures 2.78 cm. Pulmonic Valve: The pulmonic valve was normal in structure. Pulmonic valve regurgitation is not visualized. No evidence of pulmonic stenosis. Aorta: The aortic root is normal in size and structure. Venous: The inferior vena cava is dilated in size with less than 50% respiratory variability, suggesting right atrial pressure of 15 mmHg. IAS/Shunts: No atrial level shunt detected by color flow Doppler.  LEFT  VENTRICLE PLAX 2D LVIDd:         4.70 cm   Diastology LVIDs:         2.35 cm   LV e' medial:    4.57 cm/s LV PW:         1.70 cm   LV E/e' medial:  37.9 LV IVS:        1.90 cm   LV e' lateral:   8.16 cm/s LVOT diam:     2.10 cm   LV E/e' lateral: 21.2 LV SV:         110 LV SV Index:   45 LVOT Area:     3.46 cm  RIGHT VENTRICLE             IVC RV Basal diam:  4.10 cm     IVC diam: 2.20 cm RV S prime:     15.90 cm/s TAPSE (M-mode): 3.6 cm LEFT ATRIUM              Index        RIGHT ATRIUM           Index LA diam:        4.30 cm  1.75 cm/m   RA Area:     19.10 cm LA Vol (A2C):   180.0 ml 73.33 ml/m  RA Volume:   52.90 ml  21.55 ml/m LA Vol (A4C):   95.3 ml  38.83 ml/m LA Biplane Vol: 130.0 ml 52.96 ml/m  AORTIC VALVE AV Area (Vmax):    2.30 cm AV Area (Vmean):   2.57 cm AV Area (VTI):     2.78 cm AV Vmax:           197.00 cm/s AV Vmean:          113.000 cm/s AV VTI:            0.395 m AV Peak Grad:      15.5 mmHg AV Mean Grad:      7.0 mmHg LVOT Vmax:  131.00 cm/s LVOT Vmean:        83.700 cm/s LVOT VTI:          0.317 m LVOT/AV VTI ratio: 0.80  AORTA Ao Root diam: 3.40 cm MITRAL VALVE MV Area (PHT): 3.63 cm     SHUNTS MV Area VTI:   1.99 cm     Systemic VTI:  0.32 m MV Peak grad:  11.8 mmHg    Systemic Diam: 2.10 cm MV Mean grad:  3.0 mmHg MV Vmax:       1.72 m/s MV Vmean:      78.6 cm/s MV Decel Time: 209 msec MV E velocity: 173.00 cm/s MV A velocity: 136.00 cm/s MV E/A ratio:  1.27 Arvilla Meres MD Electronically signed by Arvilla Meres MD Signature Date/Time: 08/10/2023/9:33:08 AM    Final    DG Chest Portable 1 View Result Date: 08/09/2023 CLINICAL DATA:  Dyspnea.  Shortness of breath and wheezing. EXAM: PORTABLE CHEST 1 VIEW COMPARISON:  05/29/2022 FINDINGS: Stable cardiac enlargement. Chronic asymmetric elevation of right hemidiaphragm. Mild diffuse interstitial prominence. No pleural effusion or consolidative change. IMPRESSION: 1. Cardiomegaly and mild diffuse interstitial  prominence. 2. Chronic asymmetric elevation of right hemidiaphragm. Electronically Signed   By: Signa Kell M.D.   On: 08/09/2023 11:55      LOS: 2 days   CRITICAL CARE Performed by: Jacquelin Hawking   Total critical care time: 45 minutes  Critical care time was exclusive of separately billable procedures and treating other patients.  Critical care was necessary to treat or prevent imminent or life-threatening deterioration.  Critical care was time spent personally by me on the following activities: development of treatment plan with patient and/or surrogate as well as nursing, discussions with consultants, evaluation of patient's response to treatment, examination of patient, obtaining history from patient or surrogate, ordering and performing treatments and interventions, ordering and review of laboratory studies, ordering and review of radiographic studies, pulse oximetry and re-evaluation of patient's condition.  Jacquelin Hawking, MD [facility] 08/11/2023, 11:09 AM   If 7PM-7AM, please contact night-coverage [email or web address]

## 2023-08-11 NOTE — Progress Notes (Signed)
 PHARMACY - ANTICOAGULATION CONSULT NOTE  Pharmacy Consult for Heparin Indication: atrial fibrillation  Allergies  Allergen Reactions   Codeine Nausea Only and Other (See Comments)    "Allergic," per verbal MAR   Shellfish-Derived Products Hives and Other (See Comments)    "Allergic," per verbal MAR   Gabapentin Other (See Comments)    Gives nightmares- "Allergic," per verbal MAR   Meperidine Hcl Anxiety and Other (See Comments)    "Allergic," per verbal MAR    Patient Measurements: Height: 5\' 4"  (162.6 cm) Weight: (!) 163.4 kg (360 lb 3.7 oz) IBW/kg (Calculated) : 54.7 Heparin Dosing Weight: 97 kg  Vital Signs: Temp: 100.9 F (38.3 C) (03/18 0447) Temp Source: Axillary (03/18 0447) BP: 126/54 (03/18 0626) Pulse Rate: 133 (03/18 0700)  Labs: Recent Labs    08/09/23 1123 08/09/23 1339 08/10/23 0429 08/11/23 0019  HGB 9.7*  --  9.8* 9.0*  HCT 35.0*  --  35.6* 32.4*  PLT 250  --  260 227  APTT  --   --   --  78*  HEPARINUNFRC  --   --   --  >1.10*  CREATININE 1.33*  --  1.39* 1.32*  TROPONINIHS 6 7  --   --     Estimated Creatinine Clearance: 58 mL/min (A) (by C-G formula based on SCr of 1.32 mg/dL (H)).   Medical History: Past Medical History:  Diagnosis Date   Anxiety    Bronchitis    COPD (chronic obstructive pulmonary disease) (HCC)    Depression    Diabetes mellitus, type II (HCC)    Dyspnea    GERD (gastroesophageal reflux disease)    Hyperlipidemia    Hypertension    Hypothyroidism    Neuropathy    Ovarian cancer (HCC)    Shingles    Thyroid disease     Medications:  Scheduled:   arformoterol  15 mcg Nebulization BID   budesonide (PULMICORT) nebulizer solution  0.25 mg Nebulization BID   Chlorhexidine Gluconate Cloth  6 each Topical Daily   furosemide  80 mg Intravenous Q8H   insulin aspart  0-15 Units Subcutaneous Q4H   mouth rinse  15 mL Mouth Rinse 4 times per day   revefenacin  175 mcg Nebulization Daily   topiramate  50 mg Oral QHS    Infusions:   famotidine (PEPCID) IV Stopped (08/10/23 1350)   heparin 1,450 Units/hr (08/11/23 4332)    Assessment: 30 yoF admitted on 3/16 with suspected CHF, transferred to ICU on 3/17 with respiratory failure, AECOPD.  PMH includes Afib on chronic apixaban anticoagulation.  Pharmacy is consulted to dose heparin IV while apixaban is held for NPO status.  Last dose apixaban on 3/16 at ~2200  Today, 08/11/2023:  SCr 1.32 CBC:  Hgb low/decreased to 9, Plt WNL Heparin level > 1.1, falsely elevated due to recent DOAC use. aPTT 89, remains therapeutic on heparin 1450 units/hr   Goal of Therapy:  Heparin level 0.3-0.7 units/ml aPTT 66-102 seconds Monitor platelets by anticoagulation protocol: Yes   Plan:  Continue heparin IV infusion at 1450 units/hr Daily aPTT, heparin level, and CBC Follow up ability to resume PO apixaban if/when safe.    Lynann Beaver PharmD, BCPS [facility] main pharmacy [phone] 08/11/2023 7:10 AM

## 2023-08-11 NOTE — Progress Notes (Signed)
 PHARMACY - ANTICOAGULATION CONSULT NOTE  Pharmacy Consult for Heparin Indication: atrial fibrillation  Allergies  Allergen Reactions   Codeine Nausea Only and Other (See Comments)    "Allergic," per verbal MAR   Shellfish-Derived Products Hives and Other (See Comments)    "Allergic," per verbal MAR   Gabapentin Other (See Comments)    Gives nightmares- "Allergic," per verbal MAR   Meperidine Hcl Anxiety and Other (See Comments)    "Allergic," per verbal MAR    Patient Measurements: Height: 5\' 4"  (162.6 cm) Weight: (!) 163.4 kg (360 lb 3.7 oz) IBW/kg (Calculated) : 54.7 Heparin Dosing Weight: 97 kg  Vital Signs: Temp: 100.3 F (37.9 C) (03/18 0031) Temp Source: Axillary (03/18 0031) BP: 105/60 (03/18 0000) Pulse Rate: 64 (03/18 0000)  Labs: Recent Labs    08/09/23 1123 08/09/23 1339 08/10/23 0429 08/11/23 0019  HGB 9.7*  --  9.8* 9.0*  HCT 35.0*  --  35.6* 32.4*  PLT 250  --  260 227  APTT  --   --   --  78*  HEPARINUNFRC  --   --   --  >1.10*  CREATININE 1.33*  --  1.39* 1.32*  TROPONINIHS 6 7  --   --     Estimated Creatinine Clearance: 58 mL/min (A) (by C-G formula based on SCr of 1.32 mg/dL (H)).   Medical History: Past Medical History:  Diagnosis Date   Anxiety    Bronchitis    COPD (chronic obstructive pulmonary disease) (HCC)    Depression    Diabetes mellitus, type II (HCC)    Dyspnea    GERD (gastroesophageal reflux disease)    Hyperlipidemia    Hypertension    Hypothyroidism    Neuropathy    Ovarian cancer (HCC)    Shingles    Thyroid disease     Medications:  Scheduled:   arformoterol  15 mcg Nebulization BID   budesonide (PULMICORT) nebulizer solution  0.25 mg Nebulization BID   Chlorhexidine Gluconate Cloth  6 each Topical Daily   furosemide  80 mg Intravenous Q8H   insulin aspart  0-15 Units Subcutaneous Q4H   mouth rinse  15 mL Mouth Rinse 4 times per day   revefenacin  175 mcg Nebulization Daily   topiramate  50 mg Oral QHS    Infusions:   famotidine (PEPCID) IV Stopped (08/10/23 1350)   heparin 1,450 Units/hr (08/10/23 1901)    Assessment: 69 yoF admitted on 3/16 with suspected CHF, transferred to ICU on 3/17 with respiratory failure, AECOPD.  PMH includes Afib on chronic apixaban anticoagulation.  Pharmacy is consulted to dose heparin IV while apixaban is held.   Last dose apixaban on 3/16 at ~2200  08/11/2023 aPTT 78 therapeutic  on 1450 units/hr HL elevated as expected with apixaban on board Hgb 9, plts 227 No complications of therapy noted  Goal of Therapy:  Heparin level 0.3-0.7 units/ml aPTT 66-102 seconds Monitor platelets by anticoagulation protocol: Yes   Plan:  continue heparin IV infusion at 1450 units/hr Confirmatory aPTT in 8 hours Daily aPTT, heparin level, and CBC   Arley Phenix RPh 08/11/2023, 12:54 AM

## 2023-08-11 NOTE — Plan of Care (Signed)
  Problem: Education: Goal: Ability to describe self-care measures that may prevent or decrease complications (Diabetes Survival Skills Education) will improve Outcome: Progressing Goal: Individualized Educational Video(s) Outcome: Progressing   Problem: Coping: Goal: Ability to adjust to condition or change in health will improve Outcome: Progressing   Problem: Fluid Volume: Goal: Ability to maintain a balanced intake and output will improve Outcome: Progressing   Problem: Health Behavior/Discharge Planning: Goal: Ability to identify and utilize available resources and services will improve Outcome: Progressing Goal: Ability to manage health-related needs will improve Outcome: Progressing   Problem: Metabolic: Goal: Ability to maintain appropriate glucose levels will improve Outcome: Progressing   Problem: Nutritional: Goal: Maintenance of adequate nutrition will improve Outcome: Progressing Goal: Progress toward achieving an optimal weight will improve Outcome: Progressing   Problem: Skin Integrity: Goal: Risk for impaired skin integrity will decrease Outcome: Progressing   Problem: Tissue Perfusion: Goal: Adequacy of tissue perfusion will improve Outcome: Progressing   Problem: Education: Goal: Knowledge of General Education information will improve Description: Including pain rating scale, medication(s)/side effects and non-pharmacologic comfort measures Outcome: Progressing   Problem: Health Behavior/Discharge Planning: Goal: Ability to manage health-related needs will improve Outcome: Progressing   Problem: Clinical Measurements: Goal: Ability to maintain clinical measurements within normal limits will improve Outcome: Progressing Goal: Will remain free from infection Outcome: Progressing Goal: Diagnostic test results will improve Outcome: Progressing Goal: Respiratory complications will improve Outcome: Progressing Goal: Cardiovascular complication will  be avoided Outcome: Progressing   Problem: Activity: Goal: Risk for activity intolerance will decrease Outcome: Progressing   Problem: Nutrition: Goal: Adequate nutrition will be maintained Outcome: Progressing   Problem: Coping: Goal: Level of anxiety will decrease Outcome: Progressing   Problem: Elimination: Goal: Will not experience complications related to urinary retention Outcome: Progressing   Problem: Pain Managment: Goal: General experience of comfort will improve and/or be controlled Outcome: Progressing   Problem: Safety: Goal: Ability to remain free from injury will improve Outcome: Progressing   Problem: Skin Integrity: Goal: Risk for impaired skin integrity will decrease Outcome: Progressing

## 2023-08-11 NOTE — Progress Notes (Signed)
   08/11/23 2332  BiPAP/CPAP/SIPAP  BiPAP/CPAP/SIPAP Pt Type Adult  BiPAP/CPAP/SIPAP V60  Mask Type Full face mask  Dentures removed? Not applicable  Mask Size Medium  Set Rate 14 breaths/min  Respiratory Rate 28 breaths/min  IPAP 16 cmH20  EPAP 8 cmH2O  FiO2 (%) 50 %  Minute Ventilation 15.5  Leak 4  Peak Inspiratory Pressure (PIP) 17  Tidal Volume (Vt) 563  Patient Home Equipment No  Auto Titrate No  Press High Alarm 30 cmH2O  Press Low Alarm 5 cmH2O  BiPAP/CPAP /SiPAP Vitals  Pulse Rate 70  Resp (!) 28  SpO2 95 %  Bilateral Breath Sounds Diminished  MEWS Score/Color  MEWS Score 2  MEWS Score Color Yellow

## 2023-08-11 NOTE — Progress Notes (Signed)
 Attending note: I have seen and examined the patient. History, labs and imaging reviewed.  73 Y/O with morbid obesity, hypothyroidism, COPD, chronic resp failure admitted with hypoxic hypercarbic resp. Became unresponsive today and placed on Bipap Transferring to ICU. PCCM consulted More awake today.  Taken off BiPAP today morning ABG improved  Blood pressure 110/72, pulse (!) 108, temperature 98.7 F (37.1 C), temperature source Axillary, resp. rate (!) 23, height 5\' 4"  (1.626 m), weight (!) 163.4 kg, SpO2 (!) 86%. Gen:      No acute distress HEENT:  EOMI, sclera anicteric Neck:     No masses; no thyromegaly Lungs:    Clear to auscultation bilaterally; normal respiratory effort CV:         Regular rate and rhythm; no murmurs Abd:      + bowel sounds; soft, non-tender; no palpable masses, no distension Ext:    No edema; adequate peripheral perfusion Skin:      Warm and dry; no rash Neuro: Awake, alert  Labs/Imaging personally reviewed, significant for BUN/creatinine 30/1.32 Hemoglobin 9.0  Assessment/plan: Acute on chronic hypoxic, hypercarbic resp failure May be combination of HFPEF with pulmonary edema, COPD Likely has Undiagnosed OSA/OHS Bipap as needed Continue diuresis Trial for a few hrs off Bipap   PAF On beta blocker, eliquis for anticoagulation   DM SSI   Goals of care Discussed with family and they do not want resuscitation, CPR or intubation DNR order entered  Chilton Greathouse MD [facility] 08/11/2023, 9:30 AM

## 2023-08-11 NOTE — Progress Notes (Signed)
 eLink Physician-Brief Progress Note Patient Name: Glenda Boone DOB: 07-Oct-1950 MRN: [id number]   Date of Service  08/11/2023  HPI/Events of Note  73 year old female with a history of morbid obesity and COPD on chronic oxygen therapy admitted with acute hypoxic respiratory failure currently on BiPAP therapy.  Continues to have delirium and anxiety disconnecting her medical monitoring devices.  QTc within normal limits.  eICU Interventions  Olanzapine x 1.     Intervention Category Minor Interventions: Agitation / anxiety - evaluation and management  Elizer Bostic 08/11/2023, 5:22 AM

## 2023-08-12 DIAGNOSIS — I5031 Acute diastolic (congestive) heart failure: Secondary | ICD-10-CM | POA: Diagnosis not present

## 2023-08-12 DIAGNOSIS — J9621 Acute and chronic respiratory failure with hypoxia: Secondary | ICD-10-CM | POA: Diagnosis not present

## 2023-08-12 DIAGNOSIS — I509 Heart failure, unspecified: Secondary | ICD-10-CM | POA: Diagnosis not present

## 2023-08-12 DIAGNOSIS — R7881 Bacteremia: Secondary | ICD-10-CM | POA: Diagnosis not present

## 2023-08-12 DIAGNOSIS — L8990 Pressure ulcer of unspecified site, unspecified stage: Secondary | ICD-10-CM | POA: Insufficient documentation

## 2023-08-12 DIAGNOSIS — J9622 Acute and chronic respiratory failure with hypercapnia: Secondary | ICD-10-CM | POA: Diagnosis not present

## 2023-08-12 DIAGNOSIS — J449 Chronic obstructive pulmonary disease, unspecified: Secondary | ICD-10-CM | POA: Diagnosis not present

## 2023-08-12 LAB — GLUCOSE, CAPILLARY
Glucose-Capillary: 117 mg/dL — ABNORMAL HIGH (ref 70–99)
Glucose-Capillary: 123 mg/dL — ABNORMAL HIGH (ref 70–99)
Glucose-Capillary: 124 mg/dL — ABNORMAL HIGH (ref 70–99)
Glucose-Capillary: 158 mg/dL — ABNORMAL HIGH (ref 70–99)
Glucose-Capillary: 190 mg/dL — ABNORMAL HIGH (ref 70–99)
Glucose-Capillary: 218 mg/dL — ABNORMAL HIGH (ref 70–99)

## 2023-08-12 LAB — BLOOD CULTURE ID PANEL (REFLEXED) - BCID2

## 2023-08-12 LAB — APTT
aPTT: 61 s — ABNORMAL HIGH (ref 24–36)
aPTT: 87 s — ABNORMAL HIGH (ref 24–36)

## 2023-08-12 LAB — BASIC METABOLIC PANEL
Anion gap: 10 (ref 5–15)
BUN: 34 mg/dL — ABNORMAL HIGH (ref 8–23)
CO2: 37 mmol/L — ABNORMAL HIGH (ref 22–32)
Calcium: 8 mg/dL — ABNORMAL LOW (ref 8.9–10.3)
Chloride: 92 mmol/L — ABNORMAL LOW (ref 98–111)
Creatinine, Ser: 1.23 mg/dL — ABNORMAL HIGH (ref 0.44–1.00)
GFR, Estimated: 46 mL/min — ABNORMAL LOW (ref >60)
Glucose, Bld: 121 mg/dL — ABNORMAL HIGH (ref 70–99)
Potassium: 3.2 mmol/L — ABNORMAL LOW (ref 3.5–5.1)
Sodium: 139 mmol/L (ref 135–145)

## 2023-08-12 LAB — CBC
HCT: 30.5 % — ABNORMAL LOW (ref 36.0–46.0)
Hemoglobin: 8.9 g/dL — ABNORMAL LOW (ref 12.0–15.0)
MCH: 30.2 pg (ref 26.0–34.0)
MCHC: 29.2 g/dL — ABNORMAL LOW (ref 30.0–36.0)
MCV: 103.4 fL — ABNORMAL HIGH (ref 80.0–100.0)
Platelets: 235 10*3/uL (ref 150–400)
RBC: 2.95 MIL/uL — ABNORMAL LOW (ref 3.87–5.11)
RDW: 15.5 % (ref 11.5–15.5)
WBC: 9.3 10*3/uL (ref 4.0–10.5)
nRBC: 0 % (ref 0.0–0.2)

## 2023-08-12 LAB — MAGNESIUM: Magnesium: 1.9 mg/dL (ref 1.7–2.4)

## 2023-08-12 LAB — HEPARIN LEVEL (UNFRACTIONATED): Heparin Unfractionated: 1.01 [IU]/mL — ABNORMAL HIGH (ref 0.30–0.70)

## 2023-08-12 MED ORDER — LEVALBUTEROL HCL 0.63 MG/3ML IN NEBU
0.6300 mg | INHALATION_SOLUTION | Freq: Four times a day (QID) | RESPIRATORY_TRACT | Status: DC | PRN
  Filled 2023-08-12: qty 3

## 2023-08-12 MED ORDER — METOPROLOL TARTRATE 5 MG/5ML IV SOLN
5.0000 mg | Freq: Four times a day (QID) | INTRAVENOUS | Status: DC | PRN
  Administered 2023-08-12: 5 mg via INTRAVENOUS
  Filled 2023-08-12: qty 5

## 2023-08-12 MED ORDER — POTASSIUM CHLORIDE CRYS ER 20 MEQ PO TBCR
40.0000 meq | EXTENDED_RELEASE_TABLET | Freq: Once | ORAL | Status: AC
  Administered 2023-08-12: 40 meq via ORAL
  Filled 2023-08-12: qty 2

## 2023-08-12 MED ORDER — SODIUM CHLORIDE 0.9 % IV SOLN
2.0000 g | INTRAVENOUS | Status: DC
  Administered 2023-08-12 – 2023-08-17 (×31): 2 g via INTRAVENOUS
  Filled 2023-08-12 (×31): qty 2000

## 2023-08-12 MED ORDER — DILTIAZEM HCL 25 MG/5ML IV SOLN
10.0000 mg | Freq: Once | INTRAVENOUS | Status: AC
  Administered 2023-08-12: 10 mg via INTRAVENOUS
  Filled 2023-08-12: qty 5

## 2023-08-12 MED ORDER — APIXABAN 5 MG PO TABS
5.0000 mg | ORAL_TABLET | Freq: Two times a day (BID) | ORAL | Status: DC
  Administered 2023-08-12 – 2023-08-17 (×11): 5 mg via ORAL
  Filled 2023-08-12 (×11): qty 1

## 2023-08-12 NOTE — Plan of Care (Signed)
  Problem: Coping: Goal: Ability to adjust to condition or change in health will improve Outcome: Progressing   Problem: Fluid Volume: Goal: Ability to maintain a balanced intake and output will improve Outcome: Not Progressing   Problem: Nutritional: Goal: Maintenance of adequate nutrition will improve Outcome: Progressing   Problem: Activity: Goal: Risk for activity intolerance will decrease Outcome: Not Progressing   Problem: Coping: Goal: Level of anxiety will decrease Outcome: Progressing

## 2023-08-12 NOTE — Progress Notes (Signed)
 PHARMACY - ANTICOAGULATION CONSULT NOTE  Pharmacy Consult for Heparin Indication: atrial fibrillation  Allergies  Allergen Reactions   Codeine Nausea Only and Other (See Comments)    "Allergic," per verbal MAR   Shellfish-Derived Products Hives and Other (See Comments)    "Allergic," per verbal MAR   Gabapentin Other (See Comments)    Gives nightmares- "Allergic," per verbal MAR   Meperidine Hcl Anxiety and Other (See Comments)    "Allergic," per verbal MAR    Patient Measurements: Height: 5\' 4"  (162.6 cm) Weight: (!) 158.7 kg (349 lb 13.9 oz) IBW/kg (Calculated) : 54.7 Heparin Dosing Weight: 97 kg  Vital Signs: Temp: 99.9 F (37.7 C) (03/19 0400) Temp Source: Axillary (03/19 0400) BP: 138/59 (03/19 0500) Pulse Rate: 71 (03/19 0500)  Labs: Recent Labs    08/09/23 1123 08/09/23 1339 08/10/23 0429 08/11/23 0019 08/11/23 0752 08/12/23 0257  HGB 9.7*  --  9.8* 9.0*  --  8.9*  HCT 35.0*  --  35.6* 32.4*  --  30.5*  PLT 250  --  260 227  --  235  APTT  --   --   --  78* 89* 61*  HEPARINUNFRC  --   --   --  >1.10*  --  1.01*  CREATININE 1.33*  --  1.39* 1.32*  --  1.23*  TROPONINIHS 6 7  --   --   --   --     Estimated Creatinine Clearance: 61 mL/min (A) (by C-G formula based on SCr of 1.23 mg/dL (H)).   Medical History: Past Medical History:  Diagnosis Date   Anxiety    Bronchitis    COPD (chronic obstructive pulmonary disease) (HCC)    Depression    Diabetes mellitus, type II (HCC)    Dyspnea    GERD (gastroesophageal reflux disease)    Hyperlipidemia    Hypertension    Hypothyroidism    Neuropathy    Ovarian cancer (HCC)    Shingles    Thyroid disease     Medications:  Scheduled:   arformoterol  15 mcg Nebulization BID   budesonide (PULMICORT) nebulizer solution  0.25 mg Nebulization BID   Chlorhexidine Gluconate Cloth  6 each Topical Daily   furosemide  80 mg Intravenous Q8H   Gerhardt's butt cream   Topical BID   insulin aspart  0-15 Units  Subcutaneous Q4H   metoprolol succinate  12.5 mg Oral Daily   nystatin   Topical BID   mouth rinse  15 mL Mouth Rinse 4 times per day   revefenacin  175 mcg Nebulization Daily   topiramate  50 mg Oral QHS   Infusions:   ampicillin (OMNIPEN) IV 300 mL/hr at 08/12/23 0527   famotidine (PEPCID) IV Stopped (08/11/23 1302)   heparin 1,450 Units/hr (08/12/23 0527)    Assessment: 26 yoF admitted on 3/16 with suspected CHF, transferred to ICU on 3/17 with respiratory failure, AECOPD.  PMH includes Afib on chronic apixaban anticoagulation.  Pharmacy is consulted to dose heparin IV while apixaban is held for NPO status.  Last dose apixaban on 3/16 at ~2200  Today, 08/12/2023:  aPTT 61 subtherapeutic on 1450 units/hr Heparin level 1.01, falsely elevated due to recent DOAC use. Hgb 8.9, plts 235 No bleeding noted   Goal of Therapy:  Heparin level 0.3-0.7 units/ml aPTT 66-102 seconds Monitor platelets by anticoagulation protocol: Yes   Plan:  Increase heparin drip to 1650 units/hr aPTT in 8 hours Daily aPTT, heparin level, and CBC Follow up  ability to resume PO apixaban if/when safe.   Arley Phenix RPh 08/12/2023, 5:49 AM

## 2023-08-12 NOTE — Consult Note (Signed)
 [facility]       Reason for Consult: Positive blood cultures    Referring Physician: CHAMP auto consult  Principal Problem:   Acute CHF (congestive heart failure) (HCC)    arformoterol  15 mcg Nebulization BID   budesonide (PULMICORT) nebulizer solution  0.25 mg Nebulization BID   Chlorhexidine Gluconate Cloth  6 each Topical Daily   furosemide  80 mg Intravenous Q8H   Gerhardt's butt cream   Topical BID   insulin aspart  0-15 Units Subcutaneous Q4H   metoprolol succinate  12.5 mg Oral Daily   nystatin   Topical BID   mouth rinse  15 mL Mouth Rinse 4 times per day   revefenacin  175 mcg Nebulization Daily   topiramate  50 mg Oral QHS    Recommendations: Continue with ampicillin Will repeat blood cultures  Assessment: She has 1 set of blood cultures with Enterococcus faecalis but no particular signs of infection other than a low grade fever.  I suspect with her diarrhea that this represents more of a translocation and not true infection.  I will repeat the blood cultures to assure it is not persistently positive and keep her on ampicillin for now.  If repeat cultures remain negative, will stop the ampicillin.  Evaluation of this patient requires complex antimicrobial therapy evaluation and counseling + isolation needs for disease transmission risk assessment and mitigation.    HPI: Glenda Boone is a 73 y.o. female with a history of morbid obesity and generally bedbound status who came in with significant shortness of breath.  Noted increasing bilateral leg edema and continued to increase Lasix dose over continue to have issues with shortness of breath.  She was admitted for acute exacerbation of her heart failure and feels better now.  She did have a mild fever of 100.9 and blood cultures were drawn which are now positive in 1 set with Enterococcus faecalis.  She did not notice any fever at home.  She has had recent diarrhea.   Review of  Systems:  Constitutional: negative for chills All other systems reviewed and are negative    Past Medical History:  Diagnosis Date   Anxiety    Bronchitis    COPD (chronic obstructive pulmonary disease) (HCC)    Depression    Diabetes mellitus, type II (HCC)    Dyspnea    GERD (gastroesophageal reflux disease)    Hyperlipidemia    Hypertension    Hypothyroidism    Neuropathy    Ovarian cancer (HCC)    Shingles    Thyroid disease     Social History   Tobacco Use   Smoking status: Every Day    Current packs/day: 1.00    Average packs/day: 1 pack/day for 43.6 years (43.6 ttl pk-yrs)    Types: Cigarettes    Start date: 12/19/1979   Smokeless tobacco: Never  Substance Use Topics   Alcohol use: No    Alcohol/week: 0.0 standard drinks of alcohol   Drug use: No    Family History  Problem Relation Age of Onset   Heart Problems Mother    Hypertension Mother    Colon cancer Father    Arthritis-Osteo Sister     Allergies  Allergen Reactions   Codeine Nausea Only and Other (See Comments)    "Allergic," per verbal MAR   Shellfish-Derived Products Hives and Other (See Comments)    "Allergic," per verbal MAR   Gabapentin Other (See Comments)  Gives nightmares- "Allergic," per verbal MAR   Meperidine Hcl Anxiety and Other (See Comments)    "Allergic," per verbal MAR    Physical Exam: Constitutional: in no apparent distress  Vitals:   08/12/23 0900 08/12/23 0910  BP: 119/73   Pulse: (!) 145   Resp: (!) 25   Temp:    SpO2: 96% 96%   EYES: anicteric Respiratory: normal respiratory effort GI: obese  Lab Results  Component Value Date   WBC 9.3 08/12/2023   HGB 8.9 (L) 08/12/2023   HCT 30.5 (L) 08/12/2023   MCV 103.4 (H) 08/12/2023   PLT 235 08/12/2023    Lab Results  Component Value Date   CREATININE 1.23 (H) 08/12/2023   BUN 34 (H) 08/12/2023   NA 139 08/12/2023   K 3.2 (L) 08/12/2023   CL 92 (L) 08/12/2023   CO2 37 (H) 08/12/2023    Lab Results   Component Value Date   ALT 14 08/09/2023   AST 12 (L) 08/09/2023   ALKPHOS 100 08/09/2023     Microbiology: Recent Results (from the past 240 hours)  Resp panel by RT-PCR (RSV, Flu A&B, Covid) Anterior Nasal Swab     Status: None   Collection Time: 08/10/23 10:34 AM   Specimen: Anterior Nasal Swab  Result Value Ref Range Status   SARS Coronavirus 2 by RT PCR NEGATIVE NEGATIVE Final    Comment: (NOTE) SARS-CoV-2 target nucleic acids are NOT DETECTED.  The SARS-CoV-2 RNA is generally detectable in upper respiratory specimens during the acute phase of infection. The lowest concentration of SARS-CoV-2 viral copies this assay can detect is 138 copies/mL. A negative result does not preclude SARS-Cov-2 infection and should not be used as the sole basis for treatment or other patient management decisions. A negative result may occur with  improper specimen collection/handling, submission of specimen other than nasopharyngeal swab, presence of viral mutation(s) within the areas targeted by this assay, and inadequate number of viral copies(<138 copies/mL). A negative result must be combined with clinical observations, patient history, and epidemiological information. The expected result is Negative.  Fact Sheet for Patients:  BloggerCourse.com  Fact Sheet for Healthcare Providers:  SeriousBroker.it  This test is no t yet approved or cleared by the [place] FDA and  has been authorized for detection and/or diagnosis of SARS-CoV-2 by FDA under an Emergency Use Authorization (EUA). This EUA will remain  in effect (meaning this test can be used) for the duration of the COVID-19 declaration under Section 564(b)(1) of the Act, 21 U.S.C.section 360bbb-3(b)(1), unless the authorization is terminated  or revoked sooner.       Influenza A by PCR NEGATIVE NEGATIVE Final   Influenza B by PCR NEGATIVE NEGATIVE Final    Comment:  (NOTE) The Xpert Xpress SARS-CoV-2/FLU/RSV plus assay is intended as an aid in the diagnosis of influenza from Nasopharyngeal swab specimens and should not be used as a sole basis for treatment. Nasal washings and aspirates are unacceptable for Xpert Xpress SARS-CoV-2/FLU/RSV testing.  Fact Sheet for Patients: BloggerCourse.com  Fact Sheet for Healthcare Providers: SeriousBroker.it  This test is not yet approved or cleared by the [place] FDA and has been authorized for detection and/or diagnosis of SARS-CoV-2 by FDA under an Emergency Use Authorization (EUA). This EUA will remain in effect (meaning this test can be used) for the duration of the COVID-19 declaration under Section 564(b)(1) of the Act, 21 U.S.C. section 360bbb-3(b)(1), unless the authorization is terminated or revoked.  Resp Syncytial Virus by PCR NEGATIVE NEGATIVE Final    Comment: (NOTE) Fact Sheet for Patients: BloggerCourse.com  Fact Sheet for Healthcare Providers: SeriousBroker.it  This test is not yet approved or cleared by the [place] FDA and has been authorized for detection and/or diagnosis of SARS-CoV-2 by FDA under an Emergency Use Authorization (EUA). This EUA will remain in effect (meaning this test can be used) for the duration of the COVID-19 declaration under Section 564(b)(1) of the Act, 21 U.S.C. section 360bbb-3(b)(1), unless the authorization is terminated or revoked.  Performed at [facility], [address] [place], [place] [address]   Respiratory (~20 pathogens) panel by PCR     Status: None   Collection Time: 08/10/23 10:34 AM   Specimen: Nasopharyngeal Swab; Respiratory  Result Value Ref Range Status   Adenovirus NOT DETECTED NOT DETECTED Final   Coronavirus 229E NOT DETECTED NOT DETECTED Final    Comment: (NOTE) The Coronavirus on the Respiratory  Panel, DOES NOT test for the novel  Coronavirus (2019 nCoV)    Coronavirus HKU1 NOT DETECTED NOT DETECTED Final   Coronavirus NL63 NOT DETECTED NOT DETECTED Final   Coronavirus OC43 NOT DETECTED NOT DETECTED Final   Metapneumovirus NOT DETECTED NOT DETECTED Final   Rhinovirus / Enterovirus NOT DETECTED NOT DETECTED Final   Influenza A NOT DETECTED NOT DETECTED Final   Influenza B NOT DETECTED NOT DETECTED Final   Parainfluenza Virus 1 NOT DETECTED NOT DETECTED Final   Parainfluenza Virus 2 NOT DETECTED NOT DETECTED Final   Parainfluenza Virus 3 NOT DETECTED NOT DETECTED Final   Parainfluenza Virus 4 NOT DETECTED NOT DETECTED Final   Respiratory Syncytial Virus NOT DETECTED NOT DETECTED Final   Bordetella pertussis NOT DETECTED NOT DETECTED Final   Bordetella Parapertussis NOT DETECTED NOT DETECTED Final   Chlamydophila pneumoniae NOT DETECTED NOT DETECTED Final   Mycoplasma pneumoniae NOT DETECTED NOT DETECTED Final    Comment: Performed at [facility], [address] [place], [place] [address]  MRSA Next Gen by PCR, Nasal     Status: None   Collection Time: 08/10/23 11:31 AM   Specimen: Nasal Mucosa; Nasal Swab  Result Value Ref Range Status   MRSA by PCR Next Gen NOT DETECTED NOT DETECTED Final    Comment: (NOTE) The GeneXpert MRSA Assay (FDA approved for NASAL specimens only), is one component of a comprehensive MRSA colonization surveillance program. It is not intended to diagnose MRSA infection nor to guide or monitor treatment for MRSA infections. Test performance is not FDA approved in patients less than 30 years old. Performed at [facility], [address], [place], [place] [address]   Culture, blood (Routine X 2) w Reflex to ID Panel     Status: None (Preliminary result)   Collection Time: 08/11/23  8:44 AM   Specimen: BLOOD LEFT ARM  Result Value Ref Range Status   Specimen Description   Final    BLOOD LEFT ARM Performed at [facility], [address] [place], [place] [address]    Special Requests   Final    BOTTLES DRAWN AEROBIC AND ANAEROBIC Blood Culture adequate volume Performed at [facility], [address] [place], [place] [address]    Culture  Setup Time   Final    GRAM POSITIVE COCCI IN PAIRS AND CHAINS IN BOTH AEROBIC AND ANAEROBIC BOTTLES Organism ID to follow CRITICAL RESULT CALLED TO, READ BACK BY AND VERIFIED WITH: E  [email or web address]  08/12/23 MK Performed at [facility], [address] [place], [place] [address]    Culture GRAM POSITIVE COCCI IN PAIRS AND CHAINS  Final   Report Status PENDING  Incomplete  Culture, blood (Routine X 2) w Reflex to ID Panel     Status: None (Preliminary result)   Collection Time: 08/11/23  8:44 AM   Specimen: BLOOD RIGHT HAND  Result Value Ref Range Status   Specimen Description   Final    BLOOD RIGHT HAND Performed at [facility], [address], [place], [place] [address]    Special Requests   Final    BOTTLES DRAWN AEROBIC ONLY Blood Culture results may not be optimal due to an inadequate volume of blood received in culture bottles Performed at [facility], [address], [place], [place] [address]    Culture   Final    NO GROWTH < 24 HOURS Performed at [facility], [address], [place], [place] [address]    Report Status PENDING  Incomplete  Blood Culture ID Panel (Reflexed)     Status: Abnormal   Collection Time: 08/11/23  8:44 AM  Result Value Ref Range Status   Enterococcus faecalis DETECTED (A) NOT DETECTED Final    Comment: CRITICAL RESULT CALLED TO, READ BACK BY AND VERIFIED WITH: E [email or web address]  08/12/23 MK    Enterococcus Faecium NOT DETECTED NOT DETECTED Final   Listeria monocytogenes NOT DETECTED NOT DETECTED Final   Staphylococcus species NOT DETECTED NOT DETECTED Final   Staphylococcus aureus (BCID) NOT DETECTED NOT DETECTED Final   Staphylococcus epidermidis  NOT DETECTED NOT DETECTED Final   Staphylococcus lugdunensis NOT DETECTED NOT DETECTED Final   Streptococcus species NOT DETECTED NOT DETECTED Final   Streptococcus agalactiae NOT DETECTED NOT DETECTED Final   Streptococcus pneumoniae NOT DETECTED NOT DETECTED Final   Streptococcus pyogenes NOT DETECTED NOT DETECTED Final   A.calcoaceticus-baumannii NOT DETECTED NOT DETECTED Final   Bacteroides fragilis NOT DETECTED NOT DETECTED Final   Enterobacterales NOT DETECTED NOT DETECTED Final   Enterobacter cloacae complex NOT DETECTED NOT DETECTED Final   Escherichia coli NOT DETECTED NOT DETECTED Final   Klebsiella aerogenes NOT DETECTED NOT DETECTED Final   Klebsiella oxytoca NOT DETECTED NOT DETECTED Final   Klebsiella pneumoniae NOT DETECTED NOT DETECTED Final   Proteus species NOT DETECTED NOT DETECTED Final   Salmonella species NOT DETECTED NOT DETECTED Final   Serratia marcescens NOT DETECTED NOT DETECTED Final   Haemophilus influenzae NOT DETECTED NOT DETECTED Final   Neisseria meningitidis NOT DETECTED NOT DETECTED Final   Pseudomonas aeruginosa NOT DETECTED NOT DETECTED Final   Stenotrophomonas maltophilia NOT DETECTED NOT DETECTED Final   Candida albicans NOT DETECTED NOT DETECTED Final   Candida auris NOT DETECTED NOT DETECTED Final   Candida glabrata NOT DETECTED NOT DETECTED Final   Candida krusei NOT DETECTED NOT DETECTED Final   Candida parapsilosis NOT DETECTED NOT DETECTED Final   Candida tropicalis NOT DETECTED NOT DETECTED Final   Cryptococcus neoformans/gattii NOT DETECTED NOT DETECTED Final   Vancomycin resistance NOT DETECTED NOT DETECTED Final    Comment: Performed at [facility], [address], [place], [place] [address]  C Difficile Quick Screen w PCR reflex     Status: None   Collection Time: 08/11/23  1:55 PM   Specimen: STOOL  Result Value Ref Range Status   C Diff antigen NEGATIVE NEGATIVE Final   C Diff toxin NEGATIVE NEGATIVE Final  C Diff  interpretation No C. difficile detected.  Final    Comment: Performed at [facility], [address], [place], [place] [address]    Gardiner Barefoot, MD [facility] [email or web address] 08/12/2023, 10:45 AM

## 2023-08-12 NOTE — Progress Notes (Signed)
 PROGRESS NOTE    Glenda Boone  YQM:[id number] DOB: 1950/06/02 DOA: 08/09/2023 PCP: [facility]  Brief Narrative:73 y.o. female with a history of morbid obesity, bedbound, hypothyroidism, diabetes mellitus type 2, COPD, chronic respiratory failure on 3 L via nasal cannula, heart failure with preserved EF, CKD stage IIIb, depression, hyperlipidemia, paroxysmal atrial fibrillation. Patient presented secondary to shortness of breath and leg swelling with concern for possible acute heart failure. Patient started on IV Lasix. Echocardiogram obtained which is significant for preserved LVEF and grade 2 diastolic dysfunction. During hospitalization, patient was noted to have increasing lethargy requiring significant oxygen with irregular respiratory pattern. ABG obtained and was significant for evidence of acute hypercapnia with associated acidemia. Patient transferred to ICU, started on BiPAP, PCCM consulted.   Assessment & Plan:   Principal Problem:   Acute CHF (congestive heart failure) (HCC)  Acute on chronic HFpEF BNP of 215.3 with chest imaging suggestive of possible fluid. Transthoracic Echocardiogram ordered and is significant for LVEF of 70% with associated grade 2 diastolic dysfunction.  Right ventricular function is normal. -Lasix 80 mg IV every 8 -Daily BMP while on Lasix IV   Acute on chronic respiratory failure with hypoxia Acute respiratory failure with hypercapnia-patient was lethargic.  BiPAP nightly and as needed ABG 7.1 3/123/69 was placed on BiPAP with improvement in mental status on the 17th. NIV on discharge   Fever-blood culture positive for Enterococcus on ampicillin seen by ID repeat cultures ordered.  Stool C. difficile negative.  COVID RSV and flu negative. Check stool pathogen panel complete  COPD albuterol changes Xopenex due to tachycardia and A-fib RVR   Hypothyroidism -Continue levothyroxine 200 mcg daily   Primary  hypertension -Continue amlodipine   Paroxysmal atrial fibrillation-continue Lopressor, Toprol, Cardizem as needed continue Eliquis  Diabetes mellitus type 2 Well controlled for age with last hemoglobin A1C of 7.8%. Patient is on Farxiga as an outpatient. -Continue carb modified diet -Continue SSI -CBG (last 3)  Recent Labs    08/12/23 0351 08/12/23 0739 08/12/23 1206  GLUCAP 124* 123* 190*     Acute macrocytic anemia Unclear etiology. -Check anemia panel   CKD stage IIIb Stable.   Depression -Continue Wellbutrin and BuSpar   Hyperlipidemia -Continue Lipitor   Obesity, class III No current weight, however body habitus consistent with at least obesity class III. Pressure Injury 08/10/23 Sacrum Mid Stage 2 -  Partial thickness loss of dermis presenting as a shallow open injury with a red, pink wound bed without slough. (Active)  08/10/23 1449  Location: Sacrum  Location Orientation: Mid  Staging: Stage 2 -  Partial thickness loss of dermis presenting as a shallow open injury with a red, pink wound bed without slough.  Wound Description (Comments):   Present on Admission: Yes  Dressing Type Foam - Lift dressing to assess site every shift 08/11/23 1059    Estimated body mass index is 60.06 kg/m as calculated from the following:   Height as of this encounter: 5\' 4"  (1.626 m).   Weight as of this encounter: 158.7 kg.  DVT prophylaxis:eliquis Code Status: dnr Family Communication: None at bedside  disposition Plan:  Status is: Inpatient Remains inpatient appropriate because: Acute illness   Consultants:  PCCM  Procedures: None  Antimicrobials: Ampicillin   Subjective: In bed tachycardic this morning getting cleaned up  Objective: Vitals:   08/12/23 0900 08/12/23 0910 08/12/23 1011 08/12/23 1053  BP: 119/73  119/74   Pulse: (!) 145  (!) 108 (!) 127  Resp: (!) 25  20 17   Temp:      TempSrc:      SpO2: 96% 96% 96% 92%  Weight:      Height:         Intake/Output Summary (Last 24 hours) at 08/12/2023 1151 Last data filed at 08/12/2023 1011 Gross per 24 hour  Intake 1307.23 ml  Output 5500 ml  Net -4192.77 ml   Filed Weights   08/10/23 1201 08/12/23 0400  Weight: (!) 163.4 kg (!) 158.7 kg    Examination:  General exam: Appears in no acute distress Respiratory system: Clear to auscultation. Respiratory effort normal. Cardiovascular system: Tacky irregularly irregular  gastrointestinal system: Abdomen is distended, soft and nontender. No organomegaly or masses felt. Normal bowel sounds heard. Central nervous system: Alert and oriented.  Extremities: 1+ edema    Data Reviewed: I have personally reviewed following labs and imaging studies  CBC: Recent Labs  Lab 08/09/23 1123 08/10/23 0429 08/11/23 0019 08/12/23 0257  WBC 11.0* 11.8* 9.6 9.3  NEUTROABS 9.1*  --   --   --   HGB 9.7* 9.8* 9.0* 8.9*  HCT 35.0* 35.6* 32.4* 30.5*  MCV 106.1* 109.5* 106.9* 103.4*  PLT 250 260 227 235   Basic Metabolic Panel: Recent Labs  Lab 08/09/23 1123 08/10/23 0429 08/11/23 0019 08/12/23 0257  NA 141 141 145 139  K 4.8 4.9 3.9 3.2*  CL 100 100 98 92*  CO2 35* 34* 37* 37*  GLUCOSE 193* 182* 113* 121*  BUN 26* 29* 30* 34*  CREATININE 1.33* 1.39* 1.32* 1.23*  CALCIUM 8.5* 8.3* 8.0* 8.0*   GFR: Estimated Creatinine Clearance: 61 mL/min (A) (by C-G formula based on SCr of 1.23 mg/dL (H)). Liver Function Tests: Recent Labs  Lab 08/09/23 1123 08/11/23 0752  AST 12*  --   ALT 14  --   ALKPHOS 100  --   BILITOT 0.5  --   PROT 7.0  --   ALBUMIN 3.3* 2.8*   No results for input(s): "LIPASE", "AMYLASE" in the last 168 hours. No results for input(s): "AMMONIA" in the last 168 hours. Coagulation Profile: No results for input(s): "INR", "PROTIME" in the last 168 hours. Cardiac Enzymes: No results for input(s): "CKTOTAL", "CKMB", "CKMBINDEX", "TROPONINI" in the last 168 hours. BNP (last 3 results) No results for input(s):  "PROBNP" in the last 8760 hours. HbA1C: Recent Labs    08/09/23 1339  HGBA1C 7.8*   CBG: Recent Labs  Lab 08/11/23 1600 08/11/23 1935 08/11/23 2335 08/12/23 0351 08/12/23 0739  GLUCAP 127* 145* 132* 124* 123*   Lipid Profile: No results for input(s): "CHOL", "HDL", "LDLCALC", "TRIG", "CHOLHDL", "LDLDIRECT" in the last 72 hours. Thyroid Function Tests: Recent Labs    08/10/23 1317  TSH 3.493  FREET4 0.89   Anemia Panel: Recent Labs    08/11/23 0019  VITAMINB12 183  FOLATE 10.2  FERRITIN 50  TIBC 237*  IRON 25*  RETICCTPCT 3.9*   Sepsis Labs: No results for input(s): "PROCALCITON", "LATICACIDVEN" in the last 168 hours.  Recent Results (from the past 240 hours)  Resp panel by RT-PCR (RSV, Flu A&B, Covid) Anterior Nasal Swab     Status: None   Collection Time: 08/10/23 10:34 AM   Specimen: Anterior Nasal Swab  Result Value Ref Range Status   SARS Coronavirus 2 by RT PCR NEGATIVE NEGATIVE Final    Comment: (NOTE) SARS-CoV-2 target nucleic acids are NOT DETECTED.  The SARS-CoV-2 RNA is generally detectable in upper respiratory specimens during  the acute phase of infection. The lowest concentration of SARS-CoV-2 viral copies this assay can detect is 138 copies/mL. A negative result does not preclude SARS-Cov-2 infection and should not be used as the sole basis for treatment or other patient management decisions. A negative result may occur with  improper specimen collection/handling, submission of specimen other than nasopharyngeal swab, presence of viral mutation(s) within the areas targeted by this assay, and inadequate number of viral copies(<138 copies/mL). A negative result must be combined with clinical observations, patient history, and epidemiological information. The expected result is Negative.  Fact Sheet for Patients:  BloggerCourse.com  Fact Sheet for Healthcare Providers:   SeriousBroker.it  This test is no t yet approved or cleared by the [place] FDA and  has been authorized for detection and/or diagnosis of SARS-CoV-2 by FDA under an Emergency Use Authorization (EUA). This EUA will remain  in effect (meaning this test can be used) for the duration of the COVID-19 declaration under Section 564(b)(1) of the Act, 21 U.S.C.section 360bbb-3(b)(1), unless the authorization is terminated  or revoked sooner.       Influenza A by PCR NEGATIVE NEGATIVE Final   Influenza B by PCR NEGATIVE NEGATIVE Final    Comment: (NOTE) The Xpert Xpress SARS-CoV-2/FLU/RSV plus assay is intended as an aid in the diagnosis of influenza from Nasopharyngeal swab specimens and should not be used as a sole basis for treatment. Nasal washings and aspirates are unacceptable for Xpert Xpress SARS-CoV-2/FLU/RSV testing.  Fact Sheet for Patients: BloggerCourse.com  Fact Sheet for Healthcare Providers: SeriousBroker.it  This test is not yet approved or cleared by the [place] FDA and has been authorized for detection and/or diagnosis of SARS-CoV-2 by FDA under an Emergency Use Authorization (EUA). This EUA will remain in effect (meaning this test can be used) for the duration of the COVID-19 declaration under Section 564(b)(1) of the Act, 21 U.S.C. section 360bbb-3(b)(1), unless the authorization is terminated or revoked.     Resp Syncytial Virus by PCR NEGATIVE NEGATIVE Final    Comment: (NOTE) Fact Sheet for Patients: BloggerCourse.com  Fact Sheet for Healthcare Providers: SeriousBroker.it  This test is not yet approved or cleared by the [place] FDA and has been authorized for detection and/or diagnosis of SARS-CoV-2 by FDA under an Emergency Use Authorization (EUA). This EUA will remain in effect (meaning this test can be used) for  the duration of the COVID-19 declaration under Section 564(b)(1) of the Act, 21 U.S.C. section 360bbb-3(b)(1), unless the authorization is terminated or revoked.  Performed at [facility], [address], [place], [place] [address]   Respiratory (~20 pathogens) panel by PCR     Status: None   Collection Time: 08/10/23 10:34 AM   Specimen: Nasopharyngeal Swab; Respiratory  Result Value Ref Range Status   Adenovirus NOT DETECTED NOT DETECTED Final   Coronavirus 229E NOT DETECTED NOT DETECTED Final    Comment: (NOTE) The Coronavirus on the Respiratory Panel, DOES NOT test for the novel  Coronavirus (2019 nCoV)    Coronavirus HKU1 NOT DETECTED NOT DETECTED Final   Coronavirus NL63 NOT DETECTED NOT DETECTED Final   Coronavirus OC43 NOT DETECTED NOT DETECTED Final   Metapneumovirus NOT DETECTED NOT DETECTED Final   Rhinovirus / Enterovirus NOT DETECTED NOT DETECTED Final   Influenza A NOT DETECTED NOT DETECTED Final   Influenza B NOT DETECTED NOT DETECTED Final   Parainfluenza Virus 1 NOT DETECTED NOT DETECTED Final   Parainfluenza Virus 2 NOT DETECTED NOT  DETECTED Final   Parainfluenza Virus 3 NOT DETECTED NOT DETECTED Final   Parainfluenza Virus 4 NOT DETECTED NOT DETECTED Final   Respiratory Syncytial Virus NOT DETECTED NOT DETECTED Final   Bordetella pertussis NOT DETECTED NOT DETECTED Final   Bordetella Parapertussis NOT DETECTED NOT DETECTED Final   Chlamydophila pneumoniae NOT DETECTED NOT DETECTED Final   Mycoplasma pneumoniae NOT DETECTED NOT DETECTED Final    Comment: Performed at [facility], [address] [place], [place] [address]  MRSA Next Gen by PCR, Nasal     Status: None   Collection Time: 08/10/23 11:31 AM   Specimen: Nasal Mucosa; Nasal Swab  Result Value Ref Range Status   MRSA by PCR Next Gen NOT DETECTED NOT DETECTED Final    Comment: (NOTE) The GeneXpert MRSA Assay (FDA approved for NASAL specimens only), is one component of a  comprehensive MRSA colonization surveillance program. It is not intended to diagnose MRSA infection nor to guide or monitor treatment for MRSA infections. Test performance is not FDA approved in patients less than 92 years old. Performed at [facility], [address], [place], [place] [address]   Culture, blood (Routine X 2) w Reflex to ID Panel     Status: None (Preliminary result)   Collection Time: 08/11/23  8:44 AM   Specimen: BLOOD LEFT ARM  Result Value Ref Range Status   Specimen Description   Final    BLOOD LEFT ARM Performed at [facility], [address], [place], [place] [address]    Special Requests   Final    BOTTLES DRAWN AEROBIC AND ANAEROBIC Blood Culture adequate volume Performed at [facility], [address] [place], [place] [address]    Culture  Setup Time   Final    GRAM POSITIVE COCCI IN PAIRS AND CHAINS IN BOTH AEROBIC AND ANAEROBIC BOTTLES Organism ID to follow CRITICAL RESULT CALLED TO, READ BACK BY AND VERIFIED WITH: E JACKSON,[email or web address]  08/12/23 MK Performed at [facility], [address], [place], [place] [address]    Culture GRAM POSITIVE COCCI IN PAIRS AND CHAINS  Final   Report Status PENDING  Incomplete  Culture, blood (Routine X 2) w Reflex to ID Panel     Status: None (Preliminary result)   Collection Time: 08/11/23  8:44 AM   Specimen: BLOOD RIGHT HAND  Result Value Ref Range Status   Specimen Description   Final    BLOOD RIGHT HAND Performed at [facility], [address], [place], [place] [address]    Special Requests   Final    BOTTLES DRAWN AEROBIC ONLY Blood Culture results may not be optimal due to an inadequate volume of blood received in culture bottles Performed at [facility], [address], [place], [place] [address]    Culture   Final    NO GROWTH < 24 HOURS Performed at [facility], [address], [place], [place] [address]    Report  Status PENDING  Incomplete  Blood Culture ID Panel (Reflexed)     Status: Abnormal   Collection Time: 08/11/23  8:44 AM  Result Value Ref Range Status   Enterococcus faecalis DETECTED (A) NOT DETECTED Final    Comment: CRITICAL RESULT CALLED TO, READ BACK BY AND VERIFIED WITH: E JACKSON,[email or web address]  08/12/23 MK    Enterococcus Faecium NOT DETECTED NOT DETECTED Final   Listeria monocytogenes NOT DETECTED NOT DETECTED Final   Staphylococcus species NOT  DETECTED NOT DETECTED Final   Staphylococcus aureus (BCID) NOT DETECTED NOT DETECTED Final   Staphylococcus epidermidis NOT DETECTED NOT DETECTED Final   Staphylococcus lugdunensis NOT DETECTED NOT DETECTED Final   Streptococcus species NOT DETECTED NOT DETECTED Final   Streptococcus agalactiae NOT DETECTED NOT DETECTED Final   Streptococcus pneumoniae NOT DETECTED NOT DETECTED Final   Streptococcus pyogenes NOT DETECTED NOT DETECTED Final   A.calcoaceticus-baumannii NOT DETECTED NOT DETECTED Final   Bacteroides fragilis NOT DETECTED NOT DETECTED Final   Enterobacterales NOT DETECTED NOT DETECTED Final   Enterobacter cloacae complex NOT DETECTED NOT DETECTED Final   Escherichia coli NOT DETECTED NOT DETECTED Final   Klebsiella aerogenes NOT DETECTED NOT DETECTED Final   Klebsiella oxytoca NOT DETECTED NOT DETECTED Final   Klebsiella pneumoniae NOT DETECTED NOT DETECTED Final   Proteus species NOT DETECTED NOT DETECTED Final   Salmonella species NOT DETECTED NOT DETECTED Final   Serratia marcescens NOT DETECTED NOT DETECTED Final   Haemophilus influenzae NOT DETECTED NOT DETECTED Final   Neisseria meningitidis NOT DETECTED NOT DETECTED Final   Pseudomonas aeruginosa NOT DETECTED NOT DETECTED Final   Stenotrophomonas maltophilia NOT DETECTED NOT DETECTED Final   Candida albicans NOT DETECTED NOT DETECTED Final   Candida auris NOT DETECTED NOT DETECTED Final   Candida glabrata NOT DETECTED NOT DETECTED Final   Candida krusei NOT DETECTED  NOT DETECTED Final   Candida parapsilosis NOT DETECTED NOT DETECTED Final   Candida tropicalis NOT DETECTED NOT DETECTED Final   Cryptococcus neoformans/gattii NOT DETECTED NOT DETECTED Final   Vancomycin resistance NOT DETECTED NOT DETECTED Final    Comment: Performed at [facility], [address], [place], [place] [address]  C Difficile Quick Screen w PCR reflex     Status: None   Collection Time: 08/11/23  1:55 PM   Specimen: STOOL  Result Value Ref Range Status   C Diff antigen NEGATIVE NEGATIVE Final   C Diff toxin NEGATIVE NEGATIVE Final   C Diff interpretation No C. difficile detected.  Final    Comment: Performed at [facility], [address] [place], [place] [address]         Radiology Studies: DG CHEST PORT 1 VIEW Result Date: 08/11/2023 CLINICAL DATA:  Hypoxia and shortness of breath. EXAM: PORTABLE CHEST 1 VIEW COMPARISON:  Chest x-ray dated August 09, 2023. FINDINGS: Stable cardiomegaly. Low lung volumes. Worsening diffuse interstitial thickening. Possible small bilateral pleural effusions. No pneumothorax. No acute osseous abnormality. IMPRESSION: 1. Worsening interstitial pulmonary edema. Electronically Signed   By: Obie Dredge M.D.   On: 08/11/2023 12:48        Scheduled Meds:  arformoterol  15 mcg Nebulization BID   budesonide (PULMICORT) nebulizer solution  0.25 mg Nebulization BID   Chlorhexidine Gluconate Cloth  6 each Topical Daily   diltiazem  10 mg Intravenous Once   furosemide  80 mg Intravenous Q8H   Gerhardt's butt cream   Topical BID   insulin aspart  0-15 Units Subcutaneous Q4H   metoprolol succinate  12.5 mg Oral Daily   nystatin   Topical BID   mouth rinse  15 mL Mouth Rinse 4 times per day   revefenacin  175 mcg Nebulization Daily   topiramate  50 mg Oral QHS   Continuous Infusions:  ampicillin (OMNIPEN) IV 2 g (08/12/23 1144)   famotidine (PEPCID) IV Stopped (08/11/23 1302)   heparin 1,650 Units/hr  (08/12/23 0705)     LOS: 3 days  Time spent: 36 min  Alwyn Ren, MD  08/12/2023, 11:51 AM

## 2023-08-12 NOTE — Progress Notes (Signed)
 PHARMACY - PHYSICIAN COMMUNICATION CRITICAL VALUE ALERT - BLOOD CULTURE IDENTIFICATION (BCID)  Glenda Boone is an 73 y.o. female who presented to [facility] on 08/09/2023 with a chief complaint of shortness of breath and leg swelling   Assessment:  1/3 enterococcus faecalis, no R  Name of physician (or Provider) Contacted: Johann Capers  Current antibiotics: none  Changes to prescribed antibiotics recommended:  Ampicillin 2gm IV q4h  No results found for this or any previous visit.  Arley Phenix RPh 08/12/2023, 4:47 AM

## 2023-08-12 NOTE — Plan of Care (Signed)
  Problem: Health Behavior/Discharge Planning: Goal: Ability to identify and utilize available resources and services will improve Outcome: Progressing   Problem: Nutritional: Goal: Maintenance of adequate nutrition will improve Outcome: Progressing   Problem: Clinical Measurements: Goal: Ability to maintain clinical measurements within normal limits will improve Outcome: Progressing Goal: Will remain free from infection Outcome: Progressing Goal: Diagnostic test results will improve Outcome: Progressing Goal: Respiratory complications will improve Outcome: Progressing   Problem: Health Behavior/Discharge Planning: Goal: Ability to manage health-related needs will improve Outcome: Not Progressing   Problem: Nutritional: Goal: Progress toward achieving an optimal weight will improve Outcome: Not Progressing   Problem: Skin Integrity: Goal: Risk for impaired skin integrity will decrease Outcome: Not Progressing   Problem: Activity: Goal: Risk for activity intolerance will decrease Outcome: Not Progressing

## 2023-08-12 NOTE — TOC Initial Note (Addendum)
 Transition of Care [facility]) - Initial/Assessment Note    Patient Details  Name: Glenda Boone MRN: [id number] Date of Birth: 07/20/1950  Transition [facility]) CM/SW Contact:    Howell Rucks, RN Phone Number: 08/12/2023, 2:15 PM  Clinical Narrative:  Met with pt at bedside to introduce role of TOC/NCM and review for dc planning, pt drowsy sitting up in chair, opened eyes briefly while NCM was speaking. TOC consult for SNF placement, states patient would not like to return to recent SNF, family requesting SNF closer to [facility]. Request sent to attending for order for PT eval. TOC will continue to follow.     -2:41pm Confirmed with Quincy Simmonds, patient was LTC, await PT recommendation.                 Expected Discharge Plan: Skilled Nursing Facility Barriers to Discharge: Continued Medical Work up   Patient Goals and CMS Choice Patient states their goals for this hospitalization and ongoing recovery are:: return home          Expected Discharge Plan and Services       Living arrangements for the past 2 months: Single Family Home                                      Prior Living Arrangements/Services Living arrangements for the past 2 months: Single Family Home   Patient language and need for interpreter reviewed:: Yes        Need for Family Participation in Patient Care: Yes (Comment) Care giver support system in place?: Yes (comment)   Criminal Activity/Legal Involvement Pertinent to Current Situation/Hospitalization: No - Comment as needed  Activities of Daily Living   ADL Screening (condition at time of admission) Independently performs ADLs?: No Does the patient have a NEW difficulty with bathing/dressing/toileting/self-feeding that is expected to last >3 days?: No Does the patient have a NEW difficulty with getting in/out of bed, walking, or climbing stairs that is expected to last >3 days?: No Does the patient have a NEW difficulty with  communication that is expected to last >3 days?: No Is the patient deaf or have difficulty hearing?: No Does the patient have difficulty seeing, even when wearing glasses/contacts?: No Does the patient have difficulty concentrating, remembering, or making decisions?: No  Permission Sought/Granted                  Emotional Assessment Appearance:: Appears stated age Attitude/Demeanor/Rapport: Gracious Affect (typically observed): Accepting Orientation: : Oriented to Self, Oriented to Place, Oriented to  Time, Oriented to Situation Alcohol / Substance Use: Not Applicable Psych Involvement: No (comment)  Admission diagnosis:  Acute CHF (congestive heart failure) (HCC) [I50.9] Acute on chronic congestive heart failure, unspecified heart failure type [facility]) [I50.9] Patient Active Problem List   Diagnosis Date Noted   Pressure injury of skin 08/12/2023   Acute on chronic congestive heart failure (HCC) 08/09/2023   Chronic atrial fibrillation (HCC) 05/30/2022   Closed fracture of left ankle 05/24/2022   Influenza A 05/24/2022   Acute on chronic hypoxic respiratory failure (HCC) 05/23/2022   COPD exacerbation (HCC) 03/28/2022   Acute on chronic heart failure with preserved ejection fraction (HCC) 03/28/2022   Acquired thrombophilia (HCC) 02/12/2020   Current use of long term anticoagulation 02/12/2020   Current every day smoker 02/12/2020   Atrial fibrillation with rapid ventricular response (HCC) 02/11/2020   Hyperthyroidism  02/11/2020   Pneumonia due to COVID-19 virus 01/31/2020   COPD (chronic obstructive pulmonary disease) (HCC) 01/31/2020   Essential hypertension 01/31/2020   Hypoxia 01/31/2020   Obesity, Class III, BMI 40-49.9 (morbid obesity) (HCC) 01/31/2020   Acute respiratory failure with hypoxia (HCC)    Type 2 diabetes mellitus with hyperlipidemia (HCC)    Hypothyroidism    Acute metabolic encephalopathy 12/30/2019   TIA (transient ischemic attack) 09/04/2019   MDD  (major depressive disorder), recurrent, 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 [place] [address] Phone: [phone] Fax: [phone]     Social Drivers of Health (SDOH) Social History: SDOH Screenings   Food Insecurity: Patient Declined (08/09/2023)  Housing: Patient Declined (08/09/2023)  Transportation Needs: Patient Declined (08/09/2023)  Utilities: Patient Declined (08/09/2023)  Social Connections: Patient Unable To Answer (08/09/2023)  Tobacco Use: High Risk (08/09/2023)   SDOH Interventions:     Readmission Risk Interventions    08/12/2023    2:14 PM  Readmission Risk Prevention Plan  Transportation Screening Complete  PCP or Specialist Appt within 5-7 Days Complete  Home Care Screening Complete  Medication Review (RN CM) Complete

## 2023-08-12 NOTE — Progress Notes (Signed)
 NAME:  Glenda Boone, MRN:  [id number], DOB:  02/27/1951, LOS: 3 ADMISSION DATE:  08/09/2023, CONSULTATION DATE:  08/10/23 REFERRING MD:  Dr. Caleb Popp, CHIEF COMPLAINT:  Shortness of breath   History of Present Illness:  Glenda Boone is a 73 year old female presenting with shortness of breath and increased leg swelling. She lives at a SNF and is bed bound at baseline. Over the last three days before admission. She has been noticing increased leg swelling. Her lasix was increased but the swelling was not improved. 3/15 she started noticing some shortness of breath and her oxygen was increased to 4L [place]. She was brought to the ED via EMS from her facility on 3/16.   Upon presentation to the ED she was found to be fluid overloaded. ED labs were remarkable for bicarb 35, creatinine 1.33, BNP 215.3, WBC 11, HGB 9.7. Chest x ray showed chronic asymmetric elevation of right hemidiaphragm. , She was given 40 IV lasix and admitted to medicine.    Echo showed EF 70-75% with normal right heart function  3/17 she began to have decreased level on consciousness with increased oxygen needs. ABG showed severe hypercarbia, with hypoxia and pH of 7.13. PCCM was consulted for hypercarbic and hypoxic respiratory failure.   Pertinent  Medical History  COPD on 2L [place], HFpEF, morbid obesity, DM type 2, hypertension, hypothyroidism, paroxysmal a-fib on eliquis  Significant Hospital Events: Including procedures, antibiotic start and stop dates in addition to other pertinent events   3/16 admitted with suspected acute on chronic heart failure 3/17 PCCM consulted for hypercarbic and hypoxic resp failure likely related to decomp OSA/OHS. Placed on NIPPV. Diuretics escalated. Hypercarbia improved moderately w/ PCO2 123-->97 w/ this her mental status improved. Still had asterixis. GOC explored w/ sister. Verified DNR and also confirmed DNI.  3/18 delirium continued. Got dose of Olanzapine. Lab work unremarkable. AF w/ RVR this  am. Resuming BB. Had low grade temp. Cultures sent   Interim History / Subjective:   Diuresing well -7 L for this admission Was on BiPAP overnight.  Feels well today morning, eating breakfast in bed  Objective   Blood pressure 119/73, pulse (!) 143, temperature 99.9 F (37.7 C), temperature source Axillary, resp. rate (!) 28, height 5\' 4"  (1.626 m), weight (!) 158.7 kg, SpO2 (!) 87%.    FiO2 (%):  [50 %] 50 %   Intake/Output Summary (Last 24 hours) at 08/12/2023 0813 Last data filed at 08/12/2023 0759 Gross per 24 hour  Intake 1379.83 ml  Output 4895 ml  Net -3515.17 ml   Filed Weights   08/10/23 1201 08/12/23 0400  Weight: (!) 163.4 kg (!) 158.7 kg    Examination: Blood pressure 119/73, pulse (!) 143, temperature 99.9 F (37.7 C), temperature source Axillary, resp. rate (!) 28, height 5\' 4"  (1.626 m), weight (!) 158.7 kg, SpO2 (!) 87%. Gen:      No acute distress, obese HEENT:  EOMI, sclera anicteric Neck:     No masses; no thyromegaly Lungs:    Clear to auscultation bilaterally; normal respiratory effort CV:         Regular rate and rhythm; no murmurs Abd:      + bowel sounds; soft, non-tender; no palpable masses, no distension Ext:    No edema; adequate peripheral perfusion Skin:      Warm and dry; no rash Neuro: alert and oriented x 3 Psych: normal mood and affect   Lab/imaging reviewed Significant for potassium 3.2 BUN/creatinine 34/1.23 Hemoglobin  8.9 No new imaging  Resolved Hospital Problem list     Assessment & Plan:  Acute on chronic hypercarbic and hypoxic respiratory failure secondary to acute diastolic heart failure w/ volume overload  underlying COPD as well as un treated probable Obesity hypoventilation syndrome & OSA -RVP neg -I&O now negative 3.6 liters Plan Cont to push diuretics as long as BP/BUN/cr allow  Will cont mandatory HS bipap and PRN during day. Think she will need this at dc (may be a barrier if difficult to arrange), but also need to  be sure she will be compliant otherwise I think we are looking at hospice  Cont Scheduled BDs and ICS Avoid sedating meds  Paroxsymal a-fib on eliquis Was bradycardic 3/17 now in AF w/ RVR Plan Cont heparin gtt for now. If taking POs well over course of day switch back to oral DOAC in am 3/19 Lopressor  Acute metabolic encephalopathy most likely 2/2 hypercarbia and element of hospital delirium  Thyroid studies nml Plan Supportive care as above  Acute on chronic HFpEF Echo hyperdynamic with Grade II diastolic dysfunction. Pt appears fluid overloaded with LE edema, JVD, and BNP 215 Plan Cont escalated diuretic regimen  Hold home PO meds for now (farxiga, metoprolol) Add back GDMT as able   CKD (stage 3b) Creatinine 1.39, currently at baseline Plan Renal dose meds Avoid nephrotoxic agents  Low grade temp, no leukocytosis. No obvious infection  Plan Cont to monitor On ampicillin F/u pending cultures  Hx type 2 diabetes with hyperglycemia A1c 7.8. CBGs mostly within goal Plan Continue SSI moderate scale q4 Goal 140-180  Hypothyriodism Plan Resume oral synthroid when able. We can hold off from IV for now (would only consider if off her synthroid for > 4-5d and only been on hold since 3/17)  Anemia Hgb 9.8, stable Plan Transfuse for hgb <7 or symptomatic with signs of bleeding  Best Practice (right click and "Reselect all SmartList Selections" daily)   Diet/type: Regular consistency (see orders) DVT prophylaxis systemic heparin Pressure ulcer(s): N/A GI prophylaxis: H2B Lines: N/A Foley:  N/A Code Status:  DNR & DNI see IPAL note 3/17 Last date of multidisciplinary goals of care discussion [n/a]  Signature:   Chilton Greathouse MD [facility] See Amion for pager  If no response to pager , please call [phone] until 7pm After 7:00 pm call Elink  [phone] 08/12/2023, 9:07 AM

## 2023-08-12 NOTE — Progress Notes (Signed)
   08/12/23 0253  BiPAP/CPAP/SIPAP  BiPAP/CPAP/SIPAP Pt Type Adult  BiPAP/CPAP/SIPAP V60  Mask Type Full face mask  Dentures removed? Not applicable  Mask Size Medium  Set Rate 14 breaths/min  Respiratory Rate 22 breaths/min  IPAP 16 cmH20  EPAP 8 cmH2O  FiO2 (%) 50 %  Minute Ventilation 10.1  Leak 3  Peak Inspiratory Pressure (PIP) 22  Tidal Volume (Vt) 453  Patient Home Equipment No  Auto Titrate No  Press High Alarm 30 cmH2O  Press Low Alarm 5 cmH2O  CPAP/SIPAP surface wiped down Yes  BiPAP/CPAP /SiPAP Vitals  Pulse Rate 69  Resp (!) 22  BP 133/77  SpO2 94 %  Bilateral Breath Sounds Diminished

## 2023-08-13 DIAGNOSIS — J9622 Acute and chronic respiratory failure with hypercapnia: Secondary | ICD-10-CM | POA: Diagnosis not present

## 2023-08-13 DIAGNOSIS — R197 Diarrhea, unspecified: Secondary | ICD-10-CM | POA: Diagnosis not present

## 2023-08-13 DIAGNOSIS — R7881 Bacteremia: Secondary | ICD-10-CM | POA: Diagnosis not present

## 2023-08-13 DIAGNOSIS — B952 Enterococcus as the cause of diseases classified elsewhere: Secondary | ICD-10-CM | POA: Diagnosis not present

## 2023-08-13 DIAGNOSIS — J9621 Acute and chronic respiratory failure with hypoxia: Secondary | ICD-10-CM | POA: Diagnosis not present

## 2023-08-13 DIAGNOSIS — J449 Chronic obstructive pulmonary disease, unspecified: Secondary | ICD-10-CM | POA: Diagnosis not present

## 2023-08-13 DIAGNOSIS — I5031 Acute diastolic (congestive) heart failure: Secondary | ICD-10-CM | POA: Diagnosis not present

## 2023-08-13 HISTORY — DX: Bacteremia: B95.2

## 2023-08-13 LAB — GASTROINTESTINAL PANEL BY PCR, STOOL (REPLACES STOOL CULTURE)

## 2023-08-13 LAB — GLUCOSE, CAPILLARY
Glucose-Capillary: 118 mg/dL — ABNORMAL HIGH (ref 70–99)
Glucose-Capillary: 111 mg/dL — ABNORMAL HIGH (ref 70–99)
Glucose-Capillary: 167 mg/dL — ABNORMAL HIGH (ref 70–99)
Glucose-Capillary: 199 mg/dL — ABNORMAL HIGH (ref 70–99)
Glucose-Capillary: 196 mg/dL — ABNORMAL HIGH (ref 70–99)

## 2023-08-13 LAB — BASIC METABOLIC PANEL
Anion gap: 11 (ref 5–15)
BUN: 32 mg/dL — ABNORMAL HIGH (ref 8–23)
CO2: 38 mmol/L — ABNORMAL HIGH (ref 22–32)
Calcium: 7.9 mg/dL — ABNORMAL LOW (ref 8.9–10.3)
Chloride: 92 mmol/L — ABNORMAL LOW (ref 98–111)
Creatinine, Ser: 1.28 mg/dL — ABNORMAL HIGH (ref 0.44–1.00)
GFR, Estimated: 44 mL/min — ABNORMAL LOW (ref >60)
Glucose, Bld: 103 mg/dL — ABNORMAL HIGH (ref 70–99)
Potassium: 3.5 mmol/L (ref 3.5–5.1)
Sodium: 141 mmol/L (ref 135–145)

## 2023-08-13 MED ORDER — FAMOTIDINE 20 MG PO TABS
20.0000 mg | ORAL_TABLET | Freq: Every day | ORAL | Status: DC
  Administered 2023-08-13 – 2023-08-17 (×5): 20 mg via ORAL
  Filled 2023-08-13 (×5): qty 1

## 2023-08-13 MED ORDER — BUSPIRONE HCL 5 MG PO TABS
7.5000 mg | ORAL_TABLET | Freq: Two times a day (BID) | ORAL | Status: DC
  Administered 2023-08-13 – 2023-08-17 (×9): 7.5 mg via ORAL
  Filled 2023-08-13 (×9): qty 2

## 2023-08-13 MED ORDER — POTASSIUM CHLORIDE CRYS ER 20 MEQ PO TBCR
40.0000 meq | EXTENDED_RELEASE_TABLET | Freq: Once | ORAL | Status: AC
  Administered 2023-08-13: 40 meq via ORAL
  Filled 2023-08-13: qty 2

## 2023-08-13 MED ORDER — LEVOTHYROXINE SODIUM 100 MCG PO TABS
200.0000 ug | ORAL_TABLET | Freq: Every day | ORAL | Status: DC
  Administered 2023-08-14 – 2023-08-17 (×4): 200 ug via ORAL
  Filled 2023-08-13 (×4): qty 2

## 2023-08-13 MED ORDER — ATORVASTATIN CALCIUM 20 MG PO TABS
20.0000 mg | ORAL_TABLET | Freq: Every day | ORAL | Status: DC
  Administered 2023-08-13 – 2023-08-17 (×5): 20 mg via ORAL
  Filled 2023-08-13 (×5): qty 1

## 2023-08-13 MED ORDER — ORAL CARE MOUTH RINSE: 15.0000 mL | OROMUCOSAL | Status: DC | PRN

## 2023-08-13 NOTE — Evaluation (Signed)
 Physical Therapy Evaluation Patient Details Name: Glenda Boone MRN: [id number] DOB: 09-08-1950 Today's Date: 08/13/2023  History of Present Illness  Glenda Boone is a 73 year old female presenting 08/09/23  with shortness of breath and increased leg swelling, volume overload,hypercarrbic, hypoxia. PMH: morbid obese, HTN, anxiety and depression, COPD, ankle fracture,  Clinical Impression  Pt admitted with above diagnosis.  Pt currently with functional limitations due to the deficits listed below (see PT Problem List). Pt will benefit from acute skilled PT to increase their independence and safety with mobility to allow discharge.     The patient is eager to mobilize out  of the bed. Assisted patient to recliner via maxisky in order to safely assess patient's ability to stand.  Patient did stand for a few seconds x 3 at the RW with +2 max support to power up.  Patient  reports that she has been a resident of [facility] x 3 years, PTA able to transfer to a WC and mobilize via a WC. Now patient has lost ability to safely transfer. Patient will benefit from continued inpatient follow up therapy, <3 hours/day.  Patient is  motivated to improve in her ability to mobilize.  SPO2 on 4 L  > 94%. Patient with 3-4/4 dyspnea with any exertion during mobility and requires a rest after each  burst of effort.    If plan is discharge home, recommend the following: Two people to help with walking and/or transfers;A lot of help with bathing/dressing/bathroom   Can travel by private vehicle   No    Equipment Recommendations None recommended by PT  Recommendations for Other Services       Functional Status Assessment Patient has had a recent decline in their functional status and demonstrates the ability to make significant improvements in function in a reasonable and predictable amount of time.     Precautions / Restrictions Precautions Precautions: Fall Precaution/Restrictions Comments: watche sats,  on 4 LPM Restrictions Weight Bearing Restrictions Per Provider Order: No      Mobility  Bed Mobility Overal bed mobility: Needs Assistance Bed Mobility: Rolling Rolling: Max assist, +2 for safety/equipment, +2 for physical assistance         General bed mobility comments: rolls to place lift pad    Transfers Overall transfer level: Needs assistance Equipment used: Rolling walker (2 wheels) Transfers: Sit to/from Stand Sit to Stand: Max assist, Mod assist, +2 safety/equipment, +2 physical assistance           General transfer comment: mechanical lift to bari recliner. patient assisted  to stand x 3 at Rw,  able to stand   a few seconds each but on 3 rd time did she stand fully erect. 3-4/ dyspnea Transfer via Lift Equipment: Maxisky  Ambulation/Gait                  Stairs            Wheelchair Mobility     Tilt Bed    Modified Rankin (Stroke Patients Only)       Balance Overall balance assessment: Needs assistance Sitting-balance support: Bilateral upper extremity supported, Feet supported Sitting balance-Leahy Scale: Fair Sitting balance - Comments: able to lean self forward  seated in recliner   Standing balance support: Bilateral upper extremity supported, During functional activity, Reliant on assistive device for balance Standing balance-Leahy Scale: Poor  Pertinent Vitals/Pain Pain Assessment Pain Assessment: Faces Faces Pain Scale: Hurts even more Pain Location: right  knee Pain Descriptors / Indicators: Aching, Discomfort Pain Intervention(s): Monitored during session    Home Living Family/patient expects to be discharged to:: Skilled nursing facility                   Additional Comments: resident of [facility]    Prior Function Prior Level of Function : Needs assist             Mobility Comments: reports  able to transfer to [facility], uses WC for movility ADLs Comments:  assistance with ADLs and adult briefs     Extremity/Trunk Assessment   Upper Extremity Assessment Upper Extremity Assessment: Overall WFL for tasks assessed    Lower Extremity Assessment Lower Extremity Assessment: Generalized weakness (significant edema)    Cervical / Trunk Assessment Cervical / Trunk Assessment: Other exceptions Cervical / Trunk Exceptions: body habitus  Communication        Cognition Arousal: Alert Behavior During Therapy: WFL for tasks assessed/performed   PT - Cognitive impairments: No apparent impairments                         Following commands: Intact       Cueing       General Comments      Exercises     Assessment/Plan    PT Assessment Patient needs continued PT services  PT Problem List Decreased strength;Pain;Cardiopulmonary status limiting activity;Decreased range of motion;Decreased activity tolerance;Obesity;Decreased balance;Decreased mobility       PT Treatment Interventions DME instruction;Therapeutic exercise;Functional mobility training;Therapeutic activities;Patient/family education    PT Goals (Current goals can be found in the Care Plan section)  Acute Rehab PT Goals Patient Stated Goal: to be able to get up by myself PT Goal Formulation: With patient Time For Goal Achievement: 08/27/23 Potential to Achieve Goals: Good    Frequency Min 2X/week     Co-evaluation               AM-PAC PT "6 Clicks" Mobility  Outcome Measure Help needed turning from your back to your side while in a flat bed without using bedrails?: A Lot Help needed moving from lying on your back to sitting on the side of a flat bed without using bedrails?: Total Help needed moving to and from a bed to a chair (including a wheelchair)?: Total Help needed standing up from a chair using your arms (e.g., wheelchair or bedside chair)?: Total Help needed to walk in hospital room?: Total Help needed climbing 3-5 steps with a railing? :  Total 6 Click Score: 7    End of Session Equipment Utilized During Treatment: Gait belt Activity Tolerance: Patient tolerated treatment well Patient left: in chair;with call bell/phone within reach Nurse Communication: Mobility status;Need for lift equipment PT Visit Diagnosis: Unsteadiness on feet (R26.81);Muscle weakness (generalized) (M62.81);Pain Pain - Right/Left: Right Pain - part of body: Knee    Time: 1610-9604 PT Time Calculation (min) (ACUTE ONLY): 40 min   Charges:   PT Evaluation $PT Eval Low Complexity: 1 Low PT Treatments $Therapeutic Activity: 23-37 mins PT General Charges $$ ACUTE PT VISIT: 1 Visit         Blanchard Kelch PT Acute Rehabilitation Services Office [phone] Weekend pager-[phone]   Rada Hay 08/13/2023, 1:28 PM

## 2023-08-13 NOTE — Progress Notes (Signed)
 NAME:  Glenda Boone, MRN:  [id number], DOB:  11-12-1950, LOS: 4 ADMISSION DATE:  08/09/2023, CONSULTATION DATE:  08/10/23 REFERRING MD:  Dr. Caleb Popp, CHIEF COMPLAINT:  Shortness of breath   History of Present Illness:  Glenda Boone is a 73 year old female presenting with shortness of breath and increased leg swelling. She lives at a SNF and is bed bound at baseline. Over the last three days before admission. She has been noticing increased leg swelling. Her lasix was increased but the swelling was not improved. 3/15 she started noticing some shortness of breath and her oxygen was increased to 4L [place]. She was brought to the ED via EMS from her facility on 3/16.   Upon presentation to the ED she was found to be fluid overloaded. ED labs were remarkable for bicarb 35, creatinine 1.33, BNP 215.3, WBC 11, HGB 9.7. Chest x ray showed chronic asymmetric elevation of right hemidiaphragm. , She was given 40 IV lasix and admitted to medicine.    Echo showed EF 70-75% with normal right heart function  3/17 she began to have decreased level on consciousness with increased oxygen needs. ABG showed severe hypercarbia, with hypoxia and pH of 7.13. PCCM was consulted for hypercarbic and hypoxic respiratory failure.   Pertinent  Medical History  COPD on 2L [place], HFpEF, morbid obesity, DM type 2, hypertension, hypothyroidism, paroxysmal a-fib on eliquis  Significant Hospital Events: Including procedures, antibiotic start and stop dates in addition to other pertinent events   3/16 admitted with suspected acute on chronic heart failure 3/17 PCCM consulted for hypercarbic and hypoxic resp failure likely related to decomp OSA/OHS. Placed on NIPPV. Diuretics escalated. Hypercarbia improved moderately w/ PCO2 123-->97 w/ this her mental status improved. Still had asterixis. GOC explored w/ sister. Verified DNR and also confirmed DNI.  3/18 delirium continued. Got dose of Olanzapine. Lab work unremarkable. AF w/ RVR this  am. Resuming BB. Had low grade temp. Cultures sent   Interim History / Subjective:   Diuresing well -7.4 L for this admission Was on BiPAP overnight.  Feels well today morning, eating breakfast in bed  Objective   Blood pressure (!) 157/77, pulse 66, temperature 98.2 F (36.8 C), temperature source Axillary, resp. rate 15, height 5\' 4"  (1.626 m), weight (!) 158.7 kg, SpO2 99%.    FiO2 (%):  [50 %] 50 %   Intake/Output Summary (Last 24 hours) at 08/13/2023 0832 Last data filed at 08/13/2023 0317 Gross per 24 hour  Intake 666.68 ml  Output 955 ml  Net -288.32 ml   Filed Weights   08/10/23 1201 08/12/23 0400  Weight: (!) 163.4 kg (!) 158.7 kg    Examination: Blood pressure (!) 157/77, pulse 66, temperature 98.2 F (36.8 C), temperature source Axillary, resp. rate 15, height 5\' 4"  (1.626 m), weight (!) 158.7 kg, SpO2 99%. Gen:      No acute distress HEENT:  EOMI, sclera anicteric Neck:     No masses; no thyromegaly Lungs:    Clear to auscultation bilaterally; normal respiratory effort CV:         Regular rate and rhythm; no murmurs Abd:      + bowel sounds; soft, non-tender; no palpable masses, no distension Ext:    No edema; adequate peripheral perfusion Skin:      Warm and dry; no rash Neuro: alert and oriented x 3 Psych: normal mood and affect   Lab/imaging reviewed Significant for BUN/creatinine 32/1.28 Hemoglobin 8.9 No new imaging  [facility]  Problem list     Assessment & Plan:  Acute on chronic hypercarbic and hypoxic respiratory failure secondary to acute diastolic heart failure w/ volume overload  underlying COPD as well as un treated probable Obesity hypoventilation syndrome & OSA -RVP neg Plan Cont to push diuretics as long as BP/BUN/cr allow  Will cont mandatory HS bipap and PRN during day.  Will need follow-up in pulmonary clinic for PFTs and sleep study Cont Scheduled BDs and ICS Avoid sedating meds  Paroxsymal a-fib on eliquis Was bradycardic  3/17 now in AF w/ RVR Plan Anticoagulation changed to Eliquis Lopressor  Acute metabolic encephalopathy most likely 2/2 hypercarbia and element of hospital delirium  Thyroid studies nml Plan Supportive care as above  Acute on chronic HFpEF Echo hyperdynamic with Grade II diastolic dysfunction. Pt appears fluid overloaded with LE edema, JVD, and BNP 215 Plan Cont escalated diuretic regimen  Hold home PO meds for now (farxiga, metoprolol) Add back GDMT as able   CKD (stage 3b) Creatinine 1.39, currently at baseline Plan Renal dose meds Avoid nephrotoxic agents  Enterococcus bacteremia Plan Cont to monitor On ampicillin, ID is on board  Hx type 2 diabetes with hyperglycemia A1c 7.8. CBGs mostly within goal Plan Continue SSI moderate scale q4 Goal 140-180  Hypothyriodism Plan Synthroid  Anemia Hgb 9.8, stable Plan Transfuse for hgb <7 or symptomatic with signs of bleeding  Will make follow-up in pulmonary clinic.  PCCM will be available as needed.  Please call with any questions.  Best Practice (right click and "Reselect all SmartList Selections" daily)   Diet/type: Regular consistency (see orders) DVT prophylaxis systemic heparin Pressure ulcer(s): N/A GI prophylaxis: H2B Lines: N/A Foley:  N/A Code Status:  DNR & DNI see IPAL note 3/17 Last date of multidisciplinary goals of care discussion [n/a]  Signature:   Chilton Greathouse MD [facility] See Amion for pager  If no response to pager , please call [phone] until 7pm After 7:00 pm call Elink  [phone] 08/13/2023, 8:32 AM

## 2023-08-13 NOTE — Progress Notes (Addendum)
 Subjective: No new complaints still with diarrhea   Antibiotics:  Anti-infectives (From admission, onward)    Start     Dose/Rate Route Frequency Ordered Stop   08/12/23 0600  ampicillin (OMNIPEN) 2 g in sodium chloride 0.9 % 100 mL IVPB        2 g 300 mL/hr over 20 Minutes Intravenous Every 4 hours 08/12/23 0446         Medications: Scheduled Meds:  apixaban  5 mg Oral BID   arformoterol  15 mcg Nebulization BID   atorvastatin  20 mg Oral Daily   budesonide (PULMICORT) nebulizer solution  0.25 mg Nebulization BID   busPIRone  7.5 mg Oral BID   Chlorhexidine Gluconate Cloth  6 each Topical Daily   famotidine  20 mg Oral Daily   furosemide  80 mg Intravenous Q8H   Gerhardt's butt cream   Topical BID   insulin aspart  0-15 Units Subcutaneous Q4H   [START ON 08/14/2023] levothyroxine  200 mcg Oral QAC breakfast   metoprolol succinate  12.5 mg Oral Daily   nystatin   Topical BID   mouth rinse  15 mL Mouth Rinse 4 times per day   potassium chloride  40 mEq Oral Once   revefenacin  175 mcg Nebulization Daily   topiramate  50 mg Oral QHS   Continuous Infusions:  ampicillin (OMNIPEN) IV Stopped (08/13/23 0744)   PRN Meds:.acetaminophen **OR** acetaminophen, levalbuterol, metoprolol tartrate, [DISCONTINUED] ondansetron **OR** ondansetron (ZOFRAN) IV, mouth rinse, sodium chloride flush    Objective: Weight change:   Intake/Output Summary (Last 24 hours) at 08/13/2023 1052 Last data filed at 08/13/2023 0850 Gross per 24 hour  Intake 666.68 ml  Output 3850 ml  Net -3183.32 ml   Blood pressure (!) 157/77, pulse 66, temperature 98.2 F (36.8 C), temperature source Axillary, resp. rate 15, height 5\' 4"  (1.626 m), weight (!) 158.7 kg, SpO2 99%. Temp:  [98.2 F (36.8 C)-99.1 F (37.3 C)] 98.2 F (36.8 C) (03/20 0800) Pulse Rate:  [60-131] 66 (03/20 0323) Resp:  [15-30] 15 (03/20 0323) BP: (100-157)/(45-81) 157/77 (03/20 0100) SpO2:  [91 %-100 %] 99 % (03/20  0323) FiO2 (%):  [50 %] 50 % (03/20 0323)  Physical Exam: Physical Exam Constitutional:      Appearance: She is obese.  Cardiovascular:     Rate and Rhythm: Regular rhythm.     Heart sounds: Murmur heard.  Pulmonary:     Effort: No respiratory distress.     Breath sounds: No stridor. No wheezing, rhonchi or rales.  Abdominal:     General: There is no distension.     Tenderness: There is no abdominal tenderness.  Neurological:     General: No focal deficit present.     Comments: Fairly somolent  Psychiatric:        Mood and Affect: Mood normal.        Thought Content: Thought content normal.      CBC:    BMET Recent Labs    08/12/23 0257 08/13/23 0247  NA 139 141  K 3.2* 3.5  CL 92* 92*  CO2 37* 38*  GLUCOSE 121* 103*  BUN 34* 32*  CREATININE 1.23* 1.28*  CALCIUM 8.0* 7.9*     Liver Panel  Recent Labs    08/11/23 0752  ALBUMIN 2.8*       Sedimentation Rate No results for input(s): "ESRSEDRATE" in the last 72 hours. C-Reactive Protein No results for input(s): "CRP"  in the last 72 hours.  Micro Results: Recent Results (from the past 720 hours)  Resp panel by RT-PCR (RSV, Flu A&B, Covid) Anterior Nasal Swab     Status: None   Collection Time: 08/10/23 10:34 AM   Specimen: Anterior Nasal Swab  Result Value Ref Range Status   SARS Coronavirus 2 by RT PCR NEGATIVE NEGATIVE Final    Comment: (NOTE) SARS-CoV-2 target nucleic acids are NOT DETECTED.  The SARS-CoV-2 RNA is generally detectable in upper respiratory specimens during the acute phase of infection. The lowest concentration of SARS-CoV-2 viral copies this assay can detect is 138 copies/mL. A negative result does not preclude SARS-Cov-2 infection and should not be used as the sole basis for treatment or other patient management decisions. A negative result may occur with  improper specimen collection/handling, submission of specimen other than nasopharyngeal swab, presence of viral  mutation(s) within the areas targeted by this assay, and inadequate number of viral copies(<138 copies/mL). A negative result must be combined with clinical observations, patient history, and epidemiological information. The expected result is Negative.  Fact Sheet for Patients:  BloggerCourse.com  Fact Sheet for Healthcare Providers:  SeriousBroker.it  This test is no t yet approved or cleared by the [place] FDA and  has been authorized for detection and/or diagnosis of SARS-CoV-2 by FDA under an Emergency Use Authorization (EUA). This EUA will remain  in effect (meaning this test can be used) for the duration of the COVID-19 declaration under Section 564(b)(1) of the Act, 21 U.S.C.section 360bbb-3(b)(1), unless the authorization is terminated  or revoked sooner.       Influenza A by PCR NEGATIVE NEGATIVE Final   Influenza B by PCR NEGATIVE NEGATIVE Final    Comment: (NOTE) The Xpert Xpress SARS-CoV-2/FLU/RSV plus assay is intended as an aid in the diagnosis of influenza from Nasopharyngeal swab specimens and should not be used as a sole basis for treatment. Nasal washings and aspirates are unacceptable for Xpert Xpress SARS-CoV-2/FLU/RSV testing.  Fact Sheet for Patients: BloggerCourse.com  Fact Sheet for Healthcare Providers: SeriousBroker.it  This test is not yet approved or cleared by the [place] FDA and has been authorized for detection and/or diagnosis of SARS-CoV-2 by FDA under an Emergency Use Authorization (EUA). This EUA will remain in effect (meaning this test can be used) for the duration of the COVID-19 declaration under Section 564(b)(1) of the Act, 21 U.S.C. section 360bbb-3(b)(1), unless the authorization is terminated or revoked.     Resp Syncytial Virus by PCR NEGATIVE NEGATIVE Final    Comment: (NOTE) Fact Sheet for  Patients: BloggerCourse.com  Fact Sheet for Healthcare Providers: SeriousBroker.it  This test is not yet approved or cleared by the [place] FDA and has been authorized for detection and/or diagnosis of SARS-CoV-2 by FDA under an Emergency Use Authorization (EUA). This EUA will remain in effect (meaning this test can be used) for the duration of the COVID-19 declaration under Section 564(b)(1) of the Act, 21 U.S.C. section 360bbb-3(b)(1), unless the authorization is terminated or revoked.  Performed at [facility], [address], [place], [place] [address]   Respiratory (~20 pathogens) panel by PCR     Status: None   Collection Time: 08/10/23 10:34 AM   Specimen: Nasopharyngeal Swab; Respiratory  Result Value Ref Range Status   Adenovirus NOT DETECTED NOT DETECTED Final   Coronavirus 229E NOT DETECTED NOT DETECTED Final    Comment: (NOTE) The Coronavirus on the Respiratory Panel, DOES NOT test for the  novel  Coronavirus (2019 nCoV)    Coronavirus HKU1 NOT DETECTED NOT DETECTED Final   Coronavirus NL63 NOT DETECTED NOT DETECTED Final   Coronavirus OC43 NOT DETECTED NOT DETECTED Final   Metapneumovirus NOT DETECTED NOT DETECTED Final   Rhinovirus / Enterovirus NOT DETECTED NOT DETECTED Final   Influenza A NOT DETECTED NOT DETECTED Final   Influenza B NOT DETECTED NOT DETECTED Final   Parainfluenza Virus 1 NOT DETECTED NOT DETECTED Final   Parainfluenza Virus 2 NOT DETECTED NOT DETECTED Final   Parainfluenza Virus 3 NOT DETECTED NOT DETECTED Final   Parainfluenza Virus 4 NOT DETECTED NOT DETECTED Final   Respiratory Syncytial Virus NOT DETECTED NOT DETECTED Final   Bordetella pertussis NOT DETECTED NOT DETECTED Final   Bordetella Parapertussis NOT DETECTED NOT DETECTED Final   Chlamydophila pneumoniae NOT DETECTED NOT DETECTED Final   Mycoplasma pneumoniae NOT DETECTED NOT DETECTED Final    Comment:  Performed at [facility], [address], [place], [place] [address]  MRSA Next Gen by PCR, Nasal     Status: None   Collection Time: 08/10/23 11:31 AM   Specimen: Nasal Mucosa; Nasal Swab  Result Value Ref Range Status   MRSA by PCR Next Gen NOT DETECTED NOT DETECTED Final    Comment: (NOTE) The GeneXpert MRSA Assay (FDA approved for NASAL specimens only), is one component of a comprehensive MRSA colonization surveillance program. It is not intended to diagnose MRSA infection nor to guide or monitor treatment for MRSA infections. Test performance is not FDA approved in patients less than 47 years old. Performed at [facility], [address], [place], [place] [address]   Culture, blood (Routine X 2) w Reflex to ID Panel     Status: Abnormal (Preliminary result)   Collection Time: 08/11/23  8:44 AM   Specimen: BLOOD LEFT ARM  Result Value Ref Range Status   Specimen Description   Final    BLOOD LEFT ARM Performed at [facility], [address], [place], [place] [address]    Special Requests   Final    BOTTLES DRAWN AEROBIC AND ANAEROBIC Blood Culture adequate volume Performed at [facility], [address] [place], [place] [address]    Culture  Setup Time   Final    GRAM POSITIVE COCCI IN PAIRS AND CHAINS IN BOTH AEROBIC AND ANAEROBIC BOTTLES Organism ID to follow CRITICAL RESULT CALLED TO, READ BACK BY AND VERIFIED WITH: E JACKSON,[email or web address]  08/12/23 MK    Culture (A)  Final    ENTEROCOCCUS FAECALIS CULTURE REINCUBATED FOR BETTER GROWTH SUSCEPTIBILITIES TO FOLLOW Performed at [facility], [address], [place], [place] [address]    Report Status PENDING  Incomplete  Culture, blood (Routine X 2) w Reflex to ID Panel     Status: None (Preliminary result)   Collection Time: 08/11/23  8:44 AM   Specimen: BLOOD RIGHT HAND  Result Value Ref Range Status   Specimen Description   Final    BLOOD RIGHT  HAND Performed at [facility], [address], [place], [place] [address]    Special Requests   Final    BOTTLES DRAWN AEROBIC ONLY Blood Culture results may not be optimal due to an inadequate volume of blood received in culture bottles Performed at [facility] [place], [address] [place], [place] [address]    Culture   Final    NO GROWTH 2 DAYS Performed at [facility], [address], [place], [place] [address]    Report Status PENDING  Incomplete  Blood Culture ID Panel (Reflexed)     Status: Abnormal   Collection Time: 08/11/23  8:44 AM  Result Value Ref Range Status   Enterococcus faecalis DETECTED (A) NOT DETECTED Final    Comment: CRITICAL RESULT CALLED TO, READ BACK BY AND VERIFIED WITH: E JACKSON,[email or web address]  08/12/23 MK    Enterococcus Faecium NOT DETECTED NOT DETECTED Final   Listeria monocytogenes NOT DETECTED NOT DETECTED Final   Staphylococcus species NOT DETECTED NOT DETECTED Final   Staphylococcus aureus (BCID) NOT DETECTED NOT DETECTED Final   Staphylococcus epidermidis NOT DETECTED NOT DETECTED Final   Staphylococcus lugdunensis NOT DETECTED NOT DETECTED Final   Streptococcus species NOT DETECTED NOT DETECTED Final   Streptococcus agalactiae NOT DETECTED NOT DETECTED Final   Streptococcus pneumoniae NOT DETECTED NOT DETECTED Final   Streptococcus pyogenes NOT DETECTED NOT DETECTED Final   A.calcoaceticus-baumannii NOT DETECTED NOT DETECTED Final   Bacteroides fragilis NOT DETECTED NOT DETECTED Final   Enterobacterales NOT DETECTED NOT DETECTED Final   Enterobacter cloacae complex NOT DETECTED NOT DETECTED Final   Escherichia coli NOT DETECTED NOT DETECTED Final   Klebsiella aerogenes NOT DETECTED NOT DETECTED Final   Klebsiella oxytoca NOT DETECTED NOT DETECTED Final   Klebsiella pneumoniae NOT DETECTED NOT DETECTED Final   Proteus species NOT DETECTED NOT DETECTED Final   Salmonella species NOT DETECTED NOT DETECTED Final    Serratia marcescens NOT DETECTED NOT DETECTED Final   Haemophilus influenzae NOT DETECTED NOT DETECTED Final   Neisseria meningitidis NOT DETECTED NOT DETECTED Final   Pseudomonas aeruginosa NOT DETECTED NOT DETECTED Final   Stenotrophomonas maltophilia NOT DETECTED NOT DETECTED Final   Candida albicans NOT DETECTED NOT DETECTED Final   Candida auris NOT DETECTED NOT DETECTED Final   Candida glabrata NOT DETECTED NOT DETECTED Final   Candida krusei NOT DETECTED NOT DETECTED Final   Candida parapsilosis NOT DETECTED NOT DETECTED Final   Candida tropicalis NOT DETECTED NOT DETECTED Final   Cryptococcus neoformans/gattii NOT DETECTED NOT DETECTED Final   Vancomycin resistance NOT DETECTED NOT DETECTED Final    Comment: Performed at [facility], [address], [place], [place] [address]  C Difficile Quick Screen w PCR reflex     Status: None   Collection Time: 08/11/23  1:55 PM   Specimen: STOOL  Result Value Ref Range Status   C Diff antigen NEGATIVE NEGATIVE Final   C Diff toxin NEGATIVE NEGATIVE Final   C Diff interpretation No C. difficile detected.  Final    Comment: Performed at [facility], [address] [place], [place] [address]  Culture, blood (Routine X 2) w Reflex to ID Panel     Status: None (Preliminary result)   Collection Time: 08/12/23 11:04 AM   Specimen: BLOOD LEFT HAND  Result Value Ref Range Status   Specimen Description   Final    BLOOD LEFT HAND Performed at [facility], [address], [place], [place] [address]    Special Requests   Final    BOTTLES DRAWN AEROBIC AND ANAEROBIC Blood Culture results may not be optimal due to an inadequate volume of blood received in culture bottles Performed at [facility], [address], [place], [place] [address]    Culture   Final    NO GROWTH < 24 HOURS Performed at [facility], [address] [place], [place]  [address]    Report Status PENDING  Incomplete   Culture, blood (Routine X 2) w Reflex to ID Panel     Status: None (Preliminary result)   Collection Time: 08/12/23 11:12 AM   Specimen: BLOOD RIGHT HAND  Result Value Ref Range Status   Specimen Description   Final    BLOOD RIGHT HAND Performed at [facility], [address], [place], [place] [address]    Special Requests   Final    BOTTLES DRAWN AEROBIC AND ANAEROBIC Blood Culture results may not be optimal due to an inadequate volume of blood received in culture bottles Performed at [facility], [address], [place], [place] [address]    Culture   Final    NO GROWTH < 24 HOURS Performed at [facility], [address], [place], [place] [address]    Report Status PENDING  Incomplete    Studies/Results: No results found.    Assessment/Plan:  INTERVAL HISTORY: Pete blood cultures without growth   Principal Problem:   Enterococcal bacteremia Active Problems:   Acute on chronic congestive heart failure (HCC)   Pressure injury of skin    Glenda Boone is a 73 y.o. female with history of morbid obesity and generally bedbound status admitted with shortness of breath.  She was improving from standpoint of her heart failure exacerbation but then began having diarrhea and was febrile blood cultures taken yielded Enterococcus faecalis in 1 of 2 sites taken.  Her 2D echocardiogram was unrevealing though her body habitus makes it fairly low value study.  Working diagnosis is that she had gut translocation of the E faecalis into the blood in the context of her diarrheal illness.  Will continue ampicillin for now.  I do not think we would hazard a TEE in this patient who has a low likelihood of having endocarditis.   Screening for viral hepatitides and HIV she was HIV seronegative in 2021 and hepatitis B surface antigen negative then as well I will add hepatitis C antibody as well as hepatitis A total antibodies  Diarrhea: Her C. difficile  testing was negative she has a GI pathogen panel pending though I am a bit skeptical we will find something we can be certain as another significant pathogen in her stool.  In the interim she should be on enteric precautions  Evaluation of this patient requires complex antimicrobial therapy evaluation and counseling + isolation needs for disease transmission risk assessment and mitigation.    I have personally spent 50 minutes involved in face-to-face and non-face-to-face activities for this patient on the day of the visit. Professional time spent includes the following activities: Preparing to see the patient (review of tests), Obtaining and/or reviewing separately obtained history (admission/discharge record), Performing a medically appropriate examination and/or evaluation , Ordering medications/tests/procedures, referring and communicating with other health care professionals, Documenting clinical information in the EMR, Independently interpreting results (not separately reported), Communicating results to the patient/family/caregiver, Counseling and educating the patient/family/caregiver and Care coordination (not separately reported).     LOS: 4 days   Acey Lav 08/13/2023, 10:52 AM

## 2023-08-13 NOTE — Plan of Care (Signed)
  Problem: Coping: Goal: Ability to adjust to condition or change in health will improve Outcome: Progressing   Problem: Fluid Volume: Goal: Ability to maintain a balanced intake and output will improve Outcome: Progressing   Problem: Tissue Perfusion: Goal: Adequacy of tissue perfusion will improve Outcome: Progressing   Problem: Clinical Measurements: Goal: Ability to maintain clinical measurements within normal limits will improve Outcome: Progressing Goal: Will remain free from infection Outcome: Progressing Goal: Diagnostic test results will improve Outcome: Progressing Goal: Respiratory complications will improve Outcome: Progressing Goal: Cardiovascular complication will be avoided Outcome: Progressing

## 2023-08-13 NOTE — Progress Notes (Signed)
 PROGRESS NOTE    Glenda Boone  RKY:[id number] DOB: 09/16/1950 DOA: 08/09/2023 PCP: [facility]  Brief Narrative:73 y.o. female with a history of morbid obesity, bedbound, hypothyroidism, diabetes mellitus type 2, COPD, chronic respiratory failure on 3 L via nasal cannula, heart failure with preserved EF, CKD stage IIIb, depression, hyperlipidemia, paroxysmal atrial fibrillation. Patient presented secondary to shortness of breath and leg swelling with concern for possible acute heart failure. Patient started on IV Lasix. Echocardiogram obtained which is significant for preserved LVEF and grade 2 diastolic dysfunction. During hospitalization, patient was noted to have increasing lethargy requiring significant oxygen with irregular respiratory pattern. ABG obtained and was significant for evidence of acute hypercapnia with associated acidemia. Patient transferred to ICU, started on BiPAP, PCCM consulted.   Assessment & Plan:   Principal Problem:   Enterococcal bacteremia Active Problems:   Acute on chronic congestive heart failure (HCC)   Pressure injury of skin   Diarrhea  Acute on chronic HFpEF BNP of 215.3 with chest imaging suggestive of possible fluid. Transthoracic Echocardiogram ordered and is significant for LVEF of 70% with associated grade 2 diastolic dysfunction.  Right ventricular function is normal. -Lasix 80 mg IV every 8 -Daily BMP while on Lasix IV Cr 1.28 up trending will monitor closely   Acute on chronic respiratory failure with hypoxia Acute respiratory failure with hypercapnia-patient was lethargic.  BiPAP nightly and as needed ABG 7.1 3/123/69 was placed on BiPAP with improvement in mental status on the 17th. NIV on discharge   Fever-blood culture positive for Enterococcus on ampicillin seen by ID repeat cultures ordered.  Stool C. difficile negative.  COVID RSV and flu negative. Check stool pathogen panel complete  COPD albuterol changes  Xopenex due to tachycardia and A-fib RVR   Hypothyroidism -Continue levothyroxine 200 mcg daily   Primary hypertension -Continue amlodipine   Paroxysmal atrial fibrillation-continue Lopressor, Toprol, Cardizem as needed continue Eliquis  Diabetes mellitus type 2 Well controlled for age with last hemoglobin A1C of 7.8%. Patient is on Farxiga as an outpatient. -Continue carb modified diet -Continue SSI -CBG (last 3)  Recent Labs    08/13/23 0409 08/13/23 0741 08/13/23 1132  GLUCAP 118* 111* 167*     Acute macrocytic anemia Unclear etiology. -Check anemia panel   CKD stage IIIb Stable.   Depression -Continue Wellbutrin and BuSpar   Hyperlipidemia -Continue Lipitor   Obesity, class III No current weight, however body habitus consistent with at least obesity class III. Pressure Injury 08/10/23 Sacrum Mid Stage 2 -  Partial thickness loss of dermis presenting as a shallow open injury with a red, pink wound bed without slough. (Active)  08/10/23 1449  Location: Sacrum  Location Orientation: Mid  Staging: Stage 2 -  Partial thickness loss of dermis presenting as a shallow open injury with a red, pink wound bed without slough.  Wound Description (Comments):   Present on Admission: Yes  Dressing Type Foam - Lift dressing to assess site every shift 08/12/23 2000    Estimated body mass index is 60.06 kg/m as calculated from the following:   Height as of this encounter: 5\' 4"  (1.626 m).   Weight as of this encounter: 158.7 kg.  DVT prophylaxis:eliquis Code Status: dnr Family Communication: None at bedside  disposition Plan:  Status is: Inpatient Remains inpatient appropriate because: Acute illness   Consultants:  PCCM  Procedures: None  Antimicrobials: Ampicillin   Subjective:  Getting breathing treatment used bipap all nite Feels better Still with diarrhea  Ate all breakfast Sat by the side of bed yesterday  Objective: Vitals:   08/13/23 0400 08/13/23  0700 08/13/23 0800 08/13/23 1200  BP:  (!) 145/104 (!) 132/51   Pulse:  67 76   Resp:  (!) 25 (!) 32   Temp: 99 F (37.2 C)  98.2 F (36.8 C) 98.4 F (36.9 C)  TempSrc: Oral  Axillary Axillary  SpO2:  96% 94%   Weight:      Height:        Intake/Output Summary (Last 24 hours) at 08/13/2023 1423 Last data filed at 08/13/2023 0850 Gross per 24 hour  Intake 477.27 ml  Output 3850 ml  Net -3372.73 ml   Filed Weights   08/10/23 1201 08/12/23 0400  Weight: (!) 163.4 kg (!) 158.7 kg    Examination:  General exam: Appears in no acute distress Respiratory system: Clear to auscultation. Respiratory effort normal. Cardiovascular system: Tacky irregularly irregular  gastrointestinal system: Abdomen is distended, soft and nontender. No organomegaly or masses felt. Normal bowel sounds heard. Central nervous system: Alert and oriented.  Extremities: 1+ edema    Data Reviewed: I have personally reviewed following labs and imaging studies  CBC: Recent Labs  Lab 08/09/23 1123 08/10/23 0429 08/11/23 0019 08/12/23 0257  WBC 11.0* 11.8* 9.6 9.3  NEUTROABS 9.1*  --   --   --   HGB 9.7* 9.8* 9.0* 8.9*  HCT 35.0* 35.6* 32.4* 30.5*  MCV 106.1* 109.5* 106.9* 103.4*  PLT 250 260 227 235   Basic Metabolic Panel: Recent Labs  Lab 08/09/23 1123 08/10/23 0429 08/11/23 0019 08/12/23 0257 08/13/23 0247  NA 141 141 145 139 141  K 4.8 4.9 3.9 3.2* 3.5  CL 100 100 98 92* 92*  CO2 35* 34* 37* 37* 38*  GLUCOSE 193* 182* 113* 121* 103*  BUN 26* 29* 30* 34* 32*  CREATININE 1.33* 1.39* 1.32* 1.23* 1.28*  CALCIUM 8.5* 8.3* 8.0* 8.0* 7.9*  MG  --   --   --  1.9  --    GFR: Estimated Creatinine Clearance: 58.6 mL/min (A) (by C-G formula based on SCr of 1.28 mg/dL (H)). Liver Function Tests: Recent Labs  Lab 08/09/23 1123 08/11/23 0752  AST 12*  --   ALT 14  --   ALKPHOS 100  --   BILITOT 0.5  --   PROT 7.0  --   ALBUMIN 3.3* 2.8*   No results for input(s): "LIPASE", "AMYLASE" in  the last 168 hours. No results for input(s): "AMMONIA" in the last 168 hours. Coagulation Profile: No results for input(s): "INR", "PROTIME" in the last 168 hours. Cardiac Enzymes: No results for input(s): "CKTOTAL", "CKMB", "CKMBINDEX", "TROPONINI" in the last 168 hours. BNP (last 3 results) No results for input(s): "PROBNP" in the last 8760 hours. HbA1C: No results for input(s): "HGBA1C" in the last 72 hours.  CBG: Recent Labs  Lab 08/12/23 2014 08/12/23 2346 08/13/23 0409 08/13/23 0741 08/13/23 1132  GLUCAP 158* 117* 118* 111* 167*   Lipid Profile: No results for input(s): "CHOL", "HDL", "LDLCALC", "TRIG", "CHOLHDL", "LDLDIRECT" in the last 72 hours. Thyroid Function Tests: No results for input(s): "TSH", "T4TOTAL", "FREET4", "T3FREE", "THYROIDAB" in the last 72 hours.  Anemia Panel: Recent Labs    08/11/23 0019  VITAMINB12 183  FOLATE 10.2  FERRITIN 50  TIBC 237*  IRON 25*  RETICCTPCT 3.9*   Sepsis Labs: No results for input(s): "PROCALCITON", "LATICACIDVEN" in the last 168 hours.  Recent Results (from the past 240 hours)  Resp panel by RT-PCR (RSV, Flu A&B, Covid) Anterior Nasal Swab     Status: None   Collection Time: 08/10/23 10:34 AM   Specimen: Anterior Nasal Swab  Result Value Ref Range Status   SARS Coronavirus 2 by RT PCR NEGATIVE NEGATIVE Final    Comment: (NOTE) SARS-CoV-2 target nucleic acids are NOT DETECTED.  The SARS-CoV-2 RNA is generally detectable in upper respiratory specimens during the acute phase of infection. The lowest concentration of SARS-CoV-2 viral copies this assay can detect is 138 copies/mL. A negative result does not preclude SARS-Cov-2 infection and should not be used as the sole basis for treatment or other patient management decisions. A negative result may occur with  improper specimen collection/handling, submission of specimen other than nasopharyngeal swab, presence of viral mutation(s) within the areas targeted by  this assay, and inadequate number of viral copies(<138 copies/mL). A negative result must be combined with clinical observations, patient history, and epidemiological information. The expected result is Negative.  Fact Sheet for Patients:  BloggerCourse.com  Fact Sheet for Healthcare Providers:  SeriousBroker.it  This test is no t yet approved or cleared by the [place] FDA and  has been authorized for detection and/or diagnosis of SARS-CoV-2 by FDA under an Emergency Use Authorization (EUA). This EUA will remain  in effect (meaning this test can be used) for the duration of the COVID-19 declaration under Section 564(b)(1) of the Act, 21 U.S.C.section 360bbb-3(b)(1), unless the authorization is terminated  or revoked sooner.       Influenza A by PCR NEGATIVE NEGATIVE Final   Influenza B by PCR NEGATIVE NEGATIVE Final    Comment: (NOTE) The Xpert Xpress SARS-CoV-2/FLU/RSV plus assay is intended as an aid in the diagnosis of influenza from Nasopharyngeal swab specimens and should not be used as a sole basis for treatment. Nasal washings and aspirates are unacceptable for Xpert Xpress SARS-CoV-2/FLU/RSV testing.  Fact Sheet for Patients: BloggerCourse.com  Fact Sheet for Healthcare Providers: SeriousBroker.it  This test is not yet approved or cleared by the [place] FDA and has been authorized for detection and/or diagnosis of SARS-CoV-2 by FDA under an Emergency Use Authorization (EUA). This EUA will remain in effect (meaning this test can be used) for the duration of the COVID-19 declaration under Section 564(b)(1) of the Act, 21 U.S.C. section 360bbb-3(b)(1), unless the authorization is terminated or revoked.     Resp Syncytial Virus by PCR NEGATIVE NEGATIVE Final    Comment: (NOTE) Fact Sheet for Patients: BloggerCourse.com  Fact Sheet  for Healthcare Providers: SeriousBroker.it  This test is not yet approved or cleared by the [place] FDA and has been authorized for detection and/or diagnosis of SARS-CoV-2 by FDA under an Emergency Use Authorization (EUA). This EUA will remain in effect (meaning this test can be used) for the duration of the COVID-19 declaration under Section 564(b)(1) of the Act, 21 U.S.C. section 360bbb-3(b)(1), unless the authorization is terminated or revoked.  Performed at [facility], [address], [place], [place] [address]   Respiratory (~20 pathogens) panel by PCR     Status: None   Collection Time: 08/10/23 10:34 AM   Specimen: Nasopharyngeal Swab; Respiratory  Result Value Ref Range Status   Adenovirus NOT DETECTED NOT DETECTED Final   Coronavirus 229E NOT DETECTED NOT DETECTED Final    Comment: (NOTE) The Coronavirus on the Respiratory Panel, DOES NOT test for the novel  Coronavirus (2019 nCoV)    Coronavirus HKU1 NOT DETECTED NOT DETECTED Final  Coronavirus NL63 NOT DETECTED NOT DETECTED Final   Coronavirus OC43 NOT DETECTED NOT DETECTED Final   Metapneumovirus NOT DETECTED NOT DETECTED Final   Rhinovirus / Enterovirus NOT DETECTED NOT DETECTED Final   Influenza A NOT DETECTED NOT DETECTED Final   Influenza B NOT DETECTED NOT DETECTED Final   Parainfluenza Virus 1 NOT DETECTED NOT DETECTED Final   Parainfluenza Virus 2 NOT DETECTED NOT DETECTED Final   Parainfluenza Virus 3 NOT DETECTED NOT DETECTED Final   Parainfluenza Virus 4 NOT DETECTED NOT DETECTED Final   Respiratory Syncytial Virus NOT DETECTED NOT DETECTED Final   Bordetella pertussis NOT DETECTED NOT DETECTED Final   Bordetella Parapertussis NOT DETECTED NOT DETECTED Final   Chlamydophila pneumoniae NOT DETECTED NOT DETECTED Final   Mycoplasma pneumoniae NOT DETECTED NOT DETECTED Final    Comment: Performed at [facility], [address], [place], [place]  [address]  MRSA Next Gen by PCR, Nasal     Status: None   Collection Time: 08/10/23 11:31 AM   Specimen: Nasal Mucosa; Nasal Swab  Result Value Ref Range Status   MRSA by PCR Next Gen NOT DETECTED NOT DETECTED Final    Comment: (NOTE) The GeneXpert MRSA Assay (FDA approved for NASAL specimens only), is one component of a comprehensive MRSA colonization surveillance program. It is not intended to diagnose MRSA infection nor to guide or monitor treatment for MRSA infections. Test performance is not FDA approved in patients less than 3 years old. Performed at [facility], [address], [place], [place] [address]   Culture, blood (Routine X 2) w Reflex to ID Panel     Status: Abnormal (Preliminary result)   Collection Time: 08/11/23  8:44 AM   Specimen: BLOOD LEFT ARM  Result Value Ref Range Status   Specimen Description   Final    BLOOD LEFT ARM Performed at [facility], [address], [place], [place] [address]    Special Requests   Final    BOTTLES DRAWN AEROBIC AND ANAEROBIC Blood Culture adequate volume Performed at [facility], [address], [place], [place] [address]    Culture  Setup Time   Final    GRAM POSITIVE COCCI IN PAIRS AND CHAINS IN BOTH AEROBIC AND ANAEROBIC BOTTLES Organism ID to follow CRITICAL RESULT CALLED TO, READ BACK BY AND VERIFIED WITH: E JACKSON,[email or web address]  08/12/23 MK    Culture (A)  Final    ENTEROCOCCUS FAECALIS CULTURE REINCUBATED FOR BETTER GROWTH SUSCEPTIBILITIES TO FOLLOW Performed at [facility], [address] [place], [place] [address]    Report Status PENDING  Incomplete  Culture, blood (Routine X 2) w Reflex to ID Panel     Status: None (Preliminary result)   Collection Time: 08/11/23  8:44 AM   Specimen: BLOOD RIGHT HAND  Result Value Ref Range Status   Specimen Description   Final    BLOOD RIGHT HAND Performed at [facility], [address] [place], [place] [address]     Special Requests   Final    BOTTLES DRAWN AEROBIC ONLY Blood Culture results may not be optimal due to an inadequate volume of blood received in culture bottles Performed at [facility], [address] [place], [place] [address]    Culture   Final    NO GROWTH 2 DAYS Performed at [facility], [address], [place], [place] [address]    Report Status PENDING  Incomplete  Blood Culture ID Panel (Reflexed)     Status: Abnormal   Collection Time: 08/11/23  8:44 AM  Result Value Ref Range Status   Enterococcus faecalis DETECTED (A) NOT DETECTED Final    Comment: CRITICAL RESULT CALLED TO, READ BACK BY AND VERIFIED WITH: E JACKSON,[email or web address]  08/12/23 MK    Enterococcus Faecium NOT DETECTED NOT DETECTED Final   Listeria monocytogenes NOT DETECTED NOT DETECTED Final   Staphylococcus species NOT DETECTED NOT DETECTED Final   Staphylococcus aureus (BCID) NOT DETECTED NOT DETECTED Final   Staphylococcus epidermidis NOT DETECTED NOT DETECTED Final   Staphylococcus lugdunensis NOT DETECTED NOT DETECTED Final   Streptococcus species NOT DETECTED NOT DETECTED Final   Streptococcus agalactiae NOT DETECTED NOT DETECTED Final   Streptococcus pneumoniae NOT DETECTED NOT DETECTED Final   Streptococcus pyogenes NOT DETECTED NOT DETECTED Final   A.calcoaceticus-baumannii NOT DETECTED NOT DETECTED Final   Bacteroides fragilis NOT DETECTED NOT DETECTED Final   Enterobacterales NOT DETECTED NOT DETECTED Final   Enterobacter cloacae complex NOT DETECTED NOT DETECTED Final   Escherichia coli NOT DETECTED NOT DETECTED Final   Klebsiella aerogenes NOT DETECTED NOT DETECTED Final   Klebsiella oxytoca NOT DETECTED NOT DETECTED Final   Klebsiella pneumoniae NOT DETECTED NOT DETECTED Final   Proteus species NOT DETECTED NOT DETECTED Final   Salmonella species NOT DETECTED NOT DETECTED Final   Serratia marcescens NOT DETECTED NOT DETECTED Final   Haemophilus influenzae NOT  DETECTED NOT DETECTED Final   Neisseria meningitidis NOT DETECTED NOT DETECTED Final   Pseudomonas aeruginosa NOT DETECTED NOT DETECTED Final   Stenotrophomonas maltophilia NOT DETECTED NOT DETECTED Final   Candida albicans NOT DETECTED NOT DETECTED Final   Candida auris NOT DETECTED NOT DETECTED Final   Candida glabrata NOT DETECTED NOT DETECTED Final   Candida krusei NOT DETECTED NOT DETECTED Final   Candida parapsilosis NOT DETECTED NOT DETECTED Final   Candida tropicalis NOT DETECTED NOT DETECTED Final   Cryptococcus neoformans/gattii NOT DETECTED NOT DETECTED Final   Vancomycin resistance NOT DETECTED NOT DETECTED Final    Comment: Performed at [facility], [address] [place], [place] [address]  C Difficile Quick Screen w PCR reflex     Status: None   Collection Time: 08/11/23  1:55 PM   Specimen: STOOL  Result Value Ref Range Status   C Diff antigen NEGATIVE NEGATIVE Final   C Diff toxin NEGATIVE NEGATIVE Final   C Diff interpretation No C. difficile detected.  Final    Comment: Performed at [facility], [address], [place], [place] [address]  Culture, blood (Routine X 2) w Reflex to ID Panel     Status: None (Preliminary result)   Collection Time: 08/12/23 11:04 AM   Specimen: BLOOD LEFT HAND  Result Value Ref Range Status   Specimen Description   Final    BLOOD LEFT HAND Performed at [facility], [address] [place], [place] [address]    Special Requests   Final    BOTTLES DRAWN AEROBIC AND ANAEROBIC Blood Culture results may not be optimal due to an inadequate volume of blood received in culture bottles Performed at [facility], [address], [place], [place] [address]    Culture   Final    NO GROWTH < 24 HOURS Performed at [facility], [address], [place], [place] [address]    Report Status PENDING  Incomplete  Culture, blood (Routine X 2) w Reflex to  ID Panel     Status: None (Preliminary  result)   Collection Time: 08/12/23 11:12 AM   Specimen: BLOOD RIGHT HAND  Result Value Ref Range Status   Specimen Description   Final    BLOOD RIGHT HAND Performed at [facility], [address] [place], [place] [address]    Special Requests   Final    BOTTLES DRAWN AEROBIC AND ANAEROBIC Blood Culture results may not be optimal due to an inadequate volume of blood received in culture bottles Performed at [facility], [address] [place], [place] [address]    Culture   Final    NO GROWTH < 24 HOURS Performed at [facility], [address], [place], [place] [address]    Report Status PENDING  Incomplete  Gastrointestinal Panel by PCR , Stool     Status: Abnormal   Collection Time: 08/12/23  3:14 PM   Specimen: Stool  Result Value Ref Range Status   Campylobacter species NOT DETECTED NOT DETECTED Final   Plesimonas shigelloides NOT DETECTED NOT DETECTED Final   Salmonella species NOT DETECTED NOT DETECTED Final   Yersinia enterocolitica NOT DETECTED NOT DETECTED Final   Vibrio species NOT DETECTED NOT DETECTED Final   Vibrio cholerae NOT DETECTED NOT DETECTED Final   Enteroaggregative E coli (EAEC) NOT DETECTED NOT DETECTED Final   Enteropathogenic E coli (EPEC) NOT DETECTED NOT DETECTED Final   Enterotoxigenic E coli (ETEC) NOT DETECTED NOT DETECTED Final   Shiga like toxin producing E coli (STEC) NOT DETECTED NOT DETECTED Final   Shigella/Enteroinvasive E coli (EIEC) NOT DETECTED NOT DETECTED Final   Cryptosporidium NOT DETECTED NOT DETECTED Final   Cyclospora cayetanensis NOT DETECTED NOT DETECTED Final   Entamoeba histolytica NOT DETECTED NOT DETECTED Final   Giardia lamblia NOT DETECTED NOT DETECTED Final   Adenovirus F40/41 NOT DETECTED NOT DETECTED Final   Astrovirus DETECTED (A) NOT DETECTED Final   Norovirus GI/GII NOT DETECTED NOT DETECTED Final   Rotavirus A NOT DETECTED NOT DETECTED Final   Sapovirus (I, II, IV, and V) NOT  DETECTED NOT DETECTED Final    Comment: Performed at [facility], [address], [place], [place] [address]         Radiology Studies: No results found.       Scheduled Meds:  apixaban  5 mg Oral BID   arformoterol  15 mcg Nebulization BID   atorvastatin  20 mg Oral Daily   budesonide (PULMICORT) nebulizer solution  0.25 mg Nebulization BID   busPIRone  7.5 mg Oral BID   Chlorhexidine Gluconate Cloth  6 each Topical Daily   famotidine  20 mg Oral Daily   furosemide  80 mg Intravenous Q8H   Gerhardt's butt cream   Topical BID   insulin aspart  0-15 Units Subcutaneous Q4H   [START ON 08/14/2023] levothyroxine  200 mcg Oral QAC breakfast   metoprolol succinate  12.5 mg Oral Daily   nystatin   Topical BID   mouth rinse  15 mL Mouth Rinse 4 times per day   revefenacin  175 mcg Nebulization Daily   topiramate  50 mg Oral QHS   Continuous Infusions:  ampicillin (OMNIPEN) IV Stopped (08/13/23 1145)     LOS: 4 days    Time spent: 36 min  Alwyn Ren, MD  08/13/2023, 2:23 PM

## 2023-08-14 ENCOUNTER — Telehealth: Payer: Self-pay | Admitting: Pulmonary Disease

## 2023-08-14 ENCOUNTER — Inpatient Hospital Stay (HOSPITAL_COMMUNITY)

## 2023-08-14 DIAGNOSIS — B955 Unspecified streptococcus as the cause of diseases classified elsewhere: Secondary | ICD-10-CM

## 2023-08-14 DIAGNOSIS — R7881 Bacteremia: Secondary | ICD-10-CM

## 2023-08-14 DIAGNOSIS — B952 Enterococcus as the cause of diseases classified elsewhere: Secondary | ICD-10-CM | POA: Diagnosis not present

## 2023-08-14 HISTORY — DX: Unspecified streptococcus as the cause of diseases classified elsewhere: B95.5

## 2023-08-14 LAB — GLUCOSE, CAPILLARY
Glucose-Capillary: 119 mg/dL — ABNORMAL HIGH (ref 70–99)
Glucose-Capillary: 132 mg/dL — ABNORMAL HIGH (ref 70–99)
Glucose-Capillary: 147 mg/dL — ABNORMAL HIGH (ref 70–99)
Glucose-Capillary: 157 mg/dL — ABNORMAL HIGH (ref 70–99)
Glucose-Capillary: 182 mg/dL — ABNORMAL HIGH (ref 70–99)
Glucose-Capillary: 214 mg/dL — ABNORMAL HIGH (ref 70–99)
Glucose-Capillary: 217 mg/dL — ABNORMAL HIGH (ref 70–99)

## 2023-08-14 LAB — BASIC METABOLIC PANEL
Anion gap: 12 (ref 5–15)
BUN: 32 mg/dL — ABNORMAL HIGH (ref 8–23)
CO2: 35 mmol/L — ABNORMAL HIGH (ref 22–32)
Calcium: 7.8 mg/dL — ABNORMAL LOW (ref 8.9–10.3)
Chloride: 90 mmol/L — ABNORMAL LOW (ref 98–111)
Creatinine, Ser: 1.49 mg/dL — ABNORMAL HIGH (ref 0.44–1.00)
GFR, Estimated: 37 mL/min — ABNORMAL LOW (ref >60)
Glucose, Bld: 110 mg/dL — ABNORMAL HIGH (ref 70–99)
Potassium: 3.3 mmol/L — ABNORMAL LOW (ref 3.5–5.1)
Sodium: 137 mmol/L (ref 135–145)

## 2023-08-14 LAB — HEPATITIS A ANTIBODY, TOTAL: hep A Total Ab: NONREACTIVE

## 2023-08-14 MED ORDER — FUROSEMIDE 10 MG/ML IJ SOLN
80.0000 mg | Freq: Two times a day (BID) | INTRAMUSCULAR | Status: DC
  Administered 2023-08-14 – 2023-08-15 (×2): 80 mg via INTRAVENOUS
  Filled 2023-08-14 (×2): qty 8

## 2023-08-14 MED ORDER — POTASSIUM CHLORIDE CRYS ER 20 MEQ PO TBCR
40.0000 meq | EXTENDED_RELEASE_TABLET | Freq: Once | ORAL | Status: AC
  Administered 2023-08-14: 40 meq via ORAL
  Filled 2023-08-14: qty 2

## 2023-08-14 MED ORDER — OXYCODONE HCL 5 MG/5ML PO SOLN
5.0000 mg | Freq: Four times a day (QID) | ORAL | Status: DC | PRN
  Administered 2023-08-14: 5 mg via ORAL
  Filled 2023-08-14: qty 5

## 2023-08-14 NOTE — Progress Notes (Signed)
   08/14/23 0055  BiPAP/CPAP/SIPAP  BiPAP/CPAP/SIPAP Pt Type Adult  BiPAP/CPAP/SIPAP V60  Mask Type Full face mask  Dentures removed? Not applicable  Mask Size Medium  Set Rate 10 breaths/min  Respiratory Rate 21 breaths/min  IPAP 16 cmH20  EPAP 8 cmH2O  FiO2 (%) 50 %  Flow Rate 0.85 lpm  Minute Ventilation 11.7  Leak 0  Peak Inspiratory Pressure (PIP) 16  Tidal Volume (Vt) 605  Patient Home Equipment No  Auto Titrate No  Press High Alarm 30 cmH2O  Press Low Alarm 5 cmH2O

## 2023-08-14 NOTE — TOC Progression Note (Signed)
 Transition of Care [facility]) - Progression Note    Patient Details  Name: Glenda Boone MRN: [id number] Date of Birth: [birth year]  [facility] CM/SW Contact  Howell Rucks, RN Phone Number: 08/14/2023, 12:39 PM  Clinical Narrative:  PT eval completed, recommendation for short term rehab. TOC consult pt currently resides at [facility], family requesting change of facility closer to [place], [place] area. NCM called to pt's sister, Glenda Boone, introduced role of TOC/NCM. Glenda Boone agreeable to short term rehab, confirmed family requesting facility closed to [place] area to be close to family. NCM explained will fax out for short term rehab bed offers in requested area and in [place], explained if SNF in requested area accepts for short term rehab, facility may not offer LTC, Glenda Boone voiced understanding, states if unable to locate a short term rehab facility that offers LTC rehab in requested area, agreeable for patient to return to [place] for short term rehab and transition back to LTC, faxed out for bed offers. FL2 updated.       Expected Discharge Plan: Skilled Nursing Facility Barriers to Discharge: Continued Medical Work up  Expected Discharge Plan and Services       Living arrangements for the past 2 months: Single Family Home (LTC at [facility])                                       Social Determinants of Health (SDOH) Interventions SDOH Screenings   Food Insecurity: Patient Declined (08/09/2023)  Housing: Patient Declined (08/09/2023)  Transportation Needs: Patient Declined (08/09/2023)  Utilities: Patient Declined (08/09/2023)  Social Connections: Patient Unable To Answer (08/09/2023)  Tobacco Use: High Risk (08/09/2023)    Readmission Risk Interventions    08/14/2023   12:38 PM 08/12/2023    2:14 PM  Readmission Risk Prevention Plan  Transportation Screening Complete Complete  PCP or Specialist Appt within 5-7 Days Complete Complete   Home Care Screening Complete Complete  Medication Review (RN CM) Complete Complete

## 2023-08-14 NOTE — Progress Notes (Signed)
 PROGRESS NOTE    Glenda Boone  GHW:[id number] DOB: 09/25/1950 DOA: 08/09/2023 PCP: [facility]  Brief Narrative:73 y.o. female with a history of morbid obesity, bedbound, hypothyroidism, diabetes mellitus type 2, COPD, chronic respiratory failure on 3 L via nasal cannula, heart failure with preserved EF, CKD stage IIIb, depression, hyperlipidemia, paroxysmal atrial fibrillation. Patient presented secondary to shortness of breath and leg swelling with concern for possible acute heart failure. Patient started on IV Lasix. Echocardiogram obtained which is significant for preserved LVEF and grade 2 diastolic dysfunction. During hospitalization, patient was noted to have increasing lethargy requiring significant oxygen with irregular respiratory pattern. ABG obtained and was significant for evidence of acute hypercapnia with associated acidemia. Patient transferred to ICU, started on BiPAP, PCCM consulted.   Assessment & Plan:   Principal Problem:   Enterococcal bacteremia Active Problems:   Acute on chronic congestive heart failure (HCC)   Pressure injury of skin   Diarrhea  Acute on chronic HFpEF BNP of 215.3 with chest imaging suggestive of possible fluid. Transthoracic Echocardiogram ordered and is significant for LVEF of 70% with associated grade 2 diastolic dysfunction.  Right ventricular function is normal. -Lasix 80 mg IV every 8 changed to twice daily 3/21 Repeat chest x-ray today 3/21 -Daily BMP while on Lasix IV Cr 1.49 from 1.28 up trending will monitor closely   Acute on chronic respiratory failure with hypoxia Acute respiratory failure with hypercapnia-patient was lethargic.  BiPAP nightly and as needed ABG 7.1 3/123/69 was placed on BiPAP with improvement in mental status on the 17th. NIV on discharge Did not use BiPAP last night    Fever-blood culture positive for Enterococcus on ampicillin seen by ID repeat cultures ordered.  Stool C. difficile  negative.  COVID RSV and flu negative. Check stool pathogen panel positive for astrovirus!  COPD albuterol changes Xopenex due to tachycardia and A-fib RVR   Hypothyroidism -Continue levothyroxine 200 mcg daily   Primary hypertension -Continue amlodipine   Paroxysmal atrial fibrillation-continue Lopressor, Toprol, Cardizem as needed continue Eliquis  Diabetes mellitus type 2 Well controlled for age with last hemoglobin A1C of 7.8%. Patient is on Farxiga as an outpatient. -Continue carb modified diet -Continue SSI -CBG (last 3)  Recent Labs    08/14/23 0404 08/14/23 0826 08/14/23 1211  GLUCAP 147* 119* 217*     Acute macrocytic anemia Unclear etiology. -Check anemia panel   CKD stage IIIb Stable.   Depression -Continue Wellbutrin and BuSpar   Hyperlipidemia -Continue Lipitor   Obesity, class III No current weight, however body habitus consistent with at least obesity class III. Pressure Injury 08/10/23 Sacrum Mid Stage 2 -  Partial thickness loss of dermis presenting as a shallow open injury with a red, pink wound bed without slough. (Active)  08/10/23 1449  Location: Sacrum  Location Orientation: Mid  Staging: Stage 2 -  Partial thickness loss of dermis presenting as a shallow open injury with a red, pink wound bed without slough.  Wound Description (Comments):   Present on Admission: Yes  Dressing Type Foam - Lift dressing to assess site every shift 08/13/23 2030    Estimated body mass index is 60.06 kg/m as calculated from the following:   Height as of this encounter: 5\' 4"  (1.626 m).   Weight as of this encounter: 158.7 kg.  DVT prophylaxis:eliquis Code Status: dnr Family Communication: None at bedside  disposition Plan:  Status is: Inpatient Remains inpatient appropriate because: Acute illness   Consultants:  PCCM  Procedures: None  Antimicrobials: Ampicillin   Subjective:  Did not use BiPAP last night unclear as to why felt very anxious  could not use it Continues to have loose stools stool culture positive for astrovirus Objective: Vitals:   08/14/23 0730 08/14/23 0800 08/14/23 0900 08/14/23 1200  BP:  137/68 (!) 135/58   Pulse:  63 78   Resp:  10 (!) 24   Temp: 98 F (36.7 C)   98.2 F (36.8 C)  TempSrc: Oral   Oral  SpO2:  97% 94%   Weight:      Height:        Intake/Output Summary (Last 24 hours) at 08/14/2023 1302 Last data filed at 08/14/2023 0800 Gross per 24 hour  Intake 912.83 ml  Output 6740 ml  Net -5827.17 ml   Filed Weights   08/10/23 1201 08/12/23 0400  Weight: (!) 163.4 kg (!) 158.7 kg    Examination:  General exam: Appears in no acute distress Respiratory system: Clear to auscultation. Respiratory effort normal. Cardiovascular system: Tachy irregularly irregular  gastrointestinal system: Abdomen is distended, soft and nontender. No organomegaly or masses felt. Normal bowel sounds heard. Central nervous system: Alert and oriented.  Extremities: 1+ edema    Data Reviewed: I have personally reviewed following labs and imaging studies  CBC: Recent Labs  Lab 08/09/23 1123 08/10/23 0429 08/11/23 0019 08/12/23 0257  WBC 11.0* 11.8* 9.6 9.3  NEUTROABS 9.1*  --   --   --   HGB 9.7* 9.8* 9.0* 8.9*  HCT 35.0* 35.6* 32.4* 30.5*  MCV 106.1* 109.5* 106.9* 103.4*  PLT 250 260 227 235   Basic Metabolic Panel: Recent Labs  Lab 08/10/23 0429 08/11/23 0019 08/12/23 0257 08/13/23 0247 08/14/23 0237  NA 141 145 139 141 137  K 4.9 3.9 3.2* 3.5 3.3*  CL 100 98 92* 92* 90*  CO2 34* 37* 37* 38* 35*  GLUCOSE 182* 113* 121* 103* 110*  BUN 29* 30* 34* 32* 32*  CREATININE 1.39* 1.32* 1.23* 1.28* 1.49*  CALCIUM 8.3* 8.0* 8.0* 7.9* 7.8*  MG  --   --  1.9  --   --    GFR: Estimated Creatinine Clearance: 50.4 mL/min (A) (by C-G formula based on SCr of 1.49 mg/dL (H)). Liver Function Tests: Recent Labs  Lab 08/09/23 1123 08/11/23 0752  AST 12*  --   ALT 14  --   ALKPHOS 100  --   BILITOT  0.5  --   PROT 7.0  --   ALBUMIN 3.3* 2.8*   No results for input(s): "LIPASE", "AMYLASE" in the last 168 hours. No results for input(s): "AMMONIA" in the last 168 hours. Coagulation Profile: No results for input(s): "INR", "PROTIME" in the last 168 hours. Cardiac Enzymes: No results for input(s): "CKTOTAL", "CKMB", "CKMBINDEX", "TROPONINI" in the last 168 hours. BNP (last 3 results) No results for input(s): "PROBNP" in the last 8760 hours. HbA1C: No results for input(s): "HGBA1C" in the last 72 hours.  CBG: Recent Labs  Lab 08/13/23 1939 08/14/23 0010 08/14/23 0404 08/14/23 0826 08/14/23 1211  GLUCAP 196* 132* 147* 119* 217*   Lipid Profile: No results for input(s): "CHOL", "HDL", "LDLCALC", "TRIG", "CHOLHDL", "LDLDIRECT" in the last 72 hours. Thyroid Function Tests: No results for input(s): "TSH", "T4TOTAL", "FREET4", "T3FREE", "THYROIDAB" in the last 72 hours.  Anemia Panel: No results for input(s): "VITAMINB12", "FOLATE", "FERRITIN", "TIBC", "IRON", "RETICCTPCT" in the last 72 hours.  Sepsis Labs: No results for input(s): "PROCALCITON", "LATICACIDVEN" in the last 168  hours.  Recent Results (from the past 240 hours)  Resp panel by RT-PCR (RSV, Flu A&B, Covid) Anterior Nasal Swab     Status: None   Collection Time: 08/10/23 10:34 AM   Specimen: Anterior Nasal Swab  Result Value Ref Range Status   SARS Coronavirus 2 by RT PCR NEGATIVE NEGATIVE Final    Comment: (NOTE) SARS-CoV-2 target nucleic acids are NOT DETECTED.  The SARS-CoV-2 RNA is generally detectable in upper respiratory specimens during the acute phase of infection. The lowest concentration of SARS-CoV-2 viral copies this assay can detect is 138 copies/mL. A negative result does not preclude SARS-Cov-2 infection and should not be used as the sole basis for treatment or other patient management decisions. A negative result may occur with  improper specimen collection/handling, submission of specimen  other than nasopharyngeal swab, presence of viral mutation(s) within the areas targeted by this assay, and inadequate number of viral copies(<138 copies/mL). A negative result must be combined with clinical observations, patient history, and epidemiological information. The expected result is Negative.  Fact Sheet for Patients:  BloggerCourse.com  Fact Sheet for Healthcare Providers:  SeriousBroker.it  This test is no t yet approved or cleared by the [place] FDA and  has been authorized for detection and/or diagnosis of SARS-CoV-2 by FDA under an Emergency Use Authorization (EUA). This EUA will remain  in effect (meaning this test can be used) for the duration of the COVID-19 declaration under Section 564(b)(1) of the Act, 21 U.S.C.section 360bbb-3(b)(1), unless the authorization is terminated  or revoked sooner.       Influenza A by PCR NEGATIVE NEGATIVE Final   Influenza B by PCR NEGATIVE NEGATIVE Final    Comment: (NOTE) The Xpert Xpress SARS-CoV-2/FLU/RSV plus assay is intended as an aid in the diagnosis of influenza from Nasopharyngeal swab specimens and should not be used as a sole basis for treatment. Nasal washings and aspirates are unacceptable for Xpert Xpress SARS-CoV-2/FLU/RSV testing.  Fact Sheet for Patients: BloggerCourse.com  Fact Sheet for Healthcare Providers: SeriousBroker.it  This test is not yet approved or cleared by the [place] FDA and has been authorized for detection and/or diagnosis of SARS-CoV-2 by FDA under an Emergency Use Authorization (EUA). This EUA will remain in effect (meaning this test can be used) for the duration of the COVID-19 declaration under Section 564(b)(1) of the Act, 21 U.S.C. section 360bbb-3(b)(1), unless the authorization is terminated or revoked.     Resp Syncytial Virus by PCR NEGATIVE NEGATIVE Final     Comment: (NOTE) Fact Sheet for Patients: BloggerCourse.com  Fact Sheet for Healthcare Providers: SeriousBroker.it  This test is not yet approved or cleared by the [place] FDA and has been authorized for detection and/or diagnosis of SARS-CoV-2 by FDA under an Emergency Use Authorization (EUA). This EUA will remain in effect (meaning this test can be used) for the duration of the COVID-19 declaration under Section 564(b)(1) of the Act, 21 U.S.C. section 360bbb-3(b)(1), unless the authorization is terminated or revoked.  Performed at [facility], [address] [place], [place] [address]   Respiratory (~20 pathogens) panel by PCR     Status: None   Collection Time: 08/10/23 10:34 AM   Specimen: Nasopharyngeal Swab; Respiratory  Result Value Ref Range Status   Adenovirus NOT DETECTED NOT DETECTED Final   Coronavirus 229E NOT DETECTED NOT DETECTED Final    Comment: (NOTE) The Coronavirus on the Respiratory Panel, DOES NOT test for the novel  Coronavirus (2019 nCoV)  Coronavirus HKU1 NOT DETECTED NOT DETECTED Final   Coronavirus NL63 NOT DETECTED NOT DETECTED Final   Coronavirus OC43 NOT DETECTED NOT DETECTED Final   Metapneumovirus NOT DETECTED NOT DETECTED Final   Rhinovirus / Enterovirus NOT DETECTED NOT DETECTED Final   Influenza A NOT DETECTED NOT DETECTED Final   Influenza B NOT DETECTED NOT DETECTED Final   Parainfluenza Virus 1 NOT DETECTED NOT DETECTED Final   Parainfluenza Virus 2 NOT DETECTED NOT DETECTED Final   Parainfluenza Virus 3 NOT DETECTED NOT DETECTED Final   Parainfluenza Virus 4 NOT DETECTED NOT DETECTED Final   Respiratory Syncytial Virus NOT DETECTED NOT DETECTED Final   Bordetella pertussis NOT DETECTED NOT DETECTED Final   Bordetella Parapertussis NOT DETECTED NOT DETECTED Final   Chlamydophila pneumoniae NOT DETECTED NOT DETECTED Final   Mycoplasma pneumoniae NOT DETECTED NOT  DETECTED Final    Comment: Performed at [facility], [address], [place], [place] [address]  MRSA Next Gen by PCR, Nasal     Status: None   Collection Time: 08/10/23 11:31 AM   Specimen: Nasal Mucosa; Nasal Swab  Result Value Ref Range Status   MRSA by PCR Next Gen NOT DETECTED NOT DETECTED Final    Comment: (NOTE) The GeneXpert MRSA Assay (FDA approved for NASAL specimens only), is one component of a comprehensive MRSA colonization surveillance program. It is not intended to diagnose MRSA infection nor to guide or monitor treatment for MRSA infections. Test performance is not FDA approved in patients less than 22 years old. Performed at [facility], [address] [place], [place] [address]   Culture, blood (Routine X 2) w Reflex to ID Panel     Status: Abnormal (Preliminary result)   Collection Time: 08/11/23  8:44 AM   Specimen: BLOOD LEFT ARM  Result Value Ref Range Status   Specimen Description   Final    BLOOD LEFT ARM Performed at [facility], [address], [place], [place] [address]    Special Requests   Final    BOTTLES DRAWN AEROBIC AND ANAEROBIC Blood Culture adequate volume Performed at [facility], [name] [place], [place] [address]    Culture  Setup Time   Final    GRAM POSITIVE COCCI IN PAIRS AND CHAINS IN BOTH AEROBIC AND ANAEROBIC BOTTLES Organism ID to follow CRITICAL RESULT CALLED TO, READ BACK BY AND VERIFIED WITH: E JACKSON,[email or web address]  08/12/23 MK    Culture (A)  Final    ENTEROCOCCUS FAECALIS STREPTOCOCCUS MITIS/ORALIS SUSCEPTIBILITIES TO FOLLOW Performed at [facility], [address] [place], [place] [address]    Report Status PENDING  Incomplete   Organism ID, Bacteria ENTEROCOCCUS FAECALIS  Final      Susceptibility   Enterococcus faecalis - MIC*    AMPICILLIN <=2 SENSITIVE Sensitive     VANCOMYCIN 1 SENSITIVE Sensitive     GENTAMICIN SYNERGY SENSITIVE Sensitive     *  ENTEROCOCCUS FAECALIS  Culture, blood (Routine X 2) w Reflex to ID Panel     Status: None (Preliminary result)   Collection Time: 08/11/23  8:44 AM   Specimen: BLOOD RIGHT HAND  Result Value Ref Range Status   Specimen Description   Final    BLOOD RIGHT HAND Performed at [facility], [address], [place], [place] [address]    Special Requests   Final    BOTTLES DRAWN AEROBIC ONLY Blood Culture results may not be optimal due to  an inadequate volume of blood received in culture bottles Performed at [facility], [address], [place], [place] [address]    Culture   Final    NO GROWTH 3 DAYS Performed at [facility], [address] [place], [place] [address]    Report Status PENDING  Incomplete  Blood Culture ID Panel (Reflexed)     Status: Abnormal   Collection Time: 08/11/23  8:44 AM  Result Value Ref Range Status   Enterococcus faecalis DETECTED (A) NOT DETECTED Final    Comment: CRITICAL RESULT CALLED TO, READ BACK BY AND VERIFIED WITH: E JACKSON,[email or web address]  08/12/23 MK    Enterococcus Faecium NOT DETECTED NOT DETECTED Final   Listeria monocytogenes NOT DETECTED NOT DETECTED Final   Staphylococcus species NOT DETECTED NOT DETECTED Final   Staphylococcus aureus (BCID) NOT DETECTED NOT DETECTED Final   Staphylococcus epidermidis NOT DETECTED NOT DETECTED Final   Staphylococcus lugdunensis NOT DETECTED NOT DETECTED Final   Streptococcus species NOT DETECTED NOT DETECTED Final   Streptococcus agalactiae NOT DETECTED NOT DETECTED Final   Streptococcus pneumoniae NOT DETECTED NOT DETECTED Final   Streptococcus pyogenes NOT DETECTED NOT DETECTED Final   A.calcoaceticus-baumannii NOT DETECTED NOT DETECTED Final   Bacteroides fragilis NOT DETECTED NOT DETECTED Final   Enterobacterales NOT DETECTED NOT DETECTED Final   Enterobacter cloacae complex NOT DETECTED NOT DETECTED Final   Escherichia coli NOT DETECTED NOT DETECTED Final   Klebsiella  aerogenes NOT DETECTED NOT DETECTED Final   Klebsiella oxytoca NOT DETECTED NOT DETECTED Final   Klebsiella pneumoniae NOT DETECTED NOT DETECTED Final   Proteus species NOT DETECTED NOT DETECTED Final   Salmonella species NOT DETECTED NOT DETECTED Final   Serratia marcescens NOT DETECTED NOT DETECTED Final   Haemophilus influenzae NOT DETECTED NOT DETECTED Final   Neisseria meningitidis NOT DETECTED NOT DETECTED Final   Pseudomonas aeruginosa NOT DETECTED NOT DETECTED Final   Stenotrophomonas maltophilia NOT DETECTED NOT DETECTED Final   Candida albicans NOT DETECTED NOT DETECTED Final   Candida auris NOT DETECTED NOT DETECTED Final   Candida glabrata NOT DETECTED NOT DETECTED Final   Candida krusei NOT DETECTED NOT DETECTED Final   Candida parapsilosis NOT DETECTED NOT DETECTED Final   Candida tropicalis NOT DETECTED NOT DETECTED Final   Cryptococcus neoformans/gattii NOT DETECTED NOT DETECTED Final   Vancomycin resistance NOT DETECTED NOT DETECTED Final    Comment: Performed at [facility], [address], [place], [place] [address]  C Difficile Quick Screen w PCR reflex     Status: None   Collection Time: 08/11/23  1:55 PM   Specimen: STOOL  Result Value Ref Range Status   C Diff antigen NEGATIVE NEGATIVE Final   C Diff toxin NEGATIVE NEGATIVE Final   C Diff interpretation No C. difficile detected.  Final    Comment: Performed at [facility], [address], [place], [place] [address]  Culture, blood (Routine X 2) w Reflex to ID Panel     Status: None (Preliminary result)   Collection Time: 08/12/23 11:04 AM   Specimen: BLOOD LEFT HAND  Result Value Ref Range Status   Specimen Description   Final    BLOOD LEFT HAND Performed at [facility], [address] [place], [place] [address]    Special Requests   Final    BOTTLES DRAWN AEROBIC AND ANAEROBIC Blood Culture results may not be optimal due to an inadequate volume of blood received in culture  bottles Performed at [facility], [address], [place], [place] [address]    Culture   Final    NO GROWTH 2 DAYS Performed at [facility], [address], [place], [place] [address]    Report Status PENDING  Incomplete  Culture, blood (Routine X 2) w Reflex to ID Panel     Status: None (Preliminary result)   Collection Time: 08/12/23 11:12 AM   Specimen: BLOOD RIGHT HAND  Result Value Ref Range Status   Specimen Description   Final    BLOOD RIGHT HAND Performed at [facility], [address], [place], [place] [address]    Special Requests   Final    BOTTLES DRAWN AEROBIC AND ANAEROBIC Blood Culture results may not be optimal due to an inadequate volume of blood received in culture bottles Performed at [facility], [address] [place], [place] [address]    Culture   Final    NO GROWTH 2 DAYS Performed at [facility], [address], [place], [place] [address]    Report Status PENDING  Incomplete  Gastrointestinal Panel by PCR , Stool     Status: Abnormal   Collection Time: 08/12/23  3:14 PM   Specimen: Stool  Result Value Ref Range Status   Campylobacter species NOT DETECTED NOT DETECTED Final   Plesimonas shigelloides NOT DETECTED NOT DETECTED Final   Salmonella species NOT DETECTED NOT DETECTED Final   Yersinia enterocolitica NOT DETECTED NOT DETECTED Final   Vibrio species NOT DETECTED NOT DETECTED Final   Vibrio cholerae NOT DETECTED NOT DETECTED Final   Enteroaggregative E coli (EAEC) NOT DETECTED NOT DETECTED Final   Enteropathogenic E coli (EPEC) NOT DETECTED NOT DETECTED Final   Enterotoxigenic E coli (ETEC) NOT DETECTED NOT DETECTED Final   Shiga like toxin producing E coli (STEC) NOT DETECTED NOT DETECTED Final   Shigella/Enteroinvasive E coli (EIEC) NOT DETECTED NOT DETECTED Final   Cryptosporidium NOT DETECTED NOT DETECTED Final   Cyclospora cayetanensis NOT DETECTED NOT DETECTED Final   Entamoeba  histolytica NOT DETECTED NOT DETECTED Final   Giardia lamblia NOT DETECTED NOT DETECTED Final   Adenovirus F40/41 NOT DETECTED NOT DETECTED Final   Astrovirus DETECTED (A) NOT DETECTED Final   Norovirus GI/GII NOT DETECTED NOT DETECTED Final   Rotavirus A NOT DETECTED NOT DETECTED Final   Sapovirus (I, II, IV, and V) NOT DETECTED NOT DETECTED Final    Comment: Performed at [facility], [address] [place], [place] [address]         Radiology Studies: No results found.       Scheduled Meds:  apixaban  5 mg Oral BID   arformoterol  15 mcg Nebulization BID   atorvastatin  20 mg Oral Daily   budesonide (PULMICORT) nebulizer solution  0.25 mg Nebulization BID   busPIRone  7.5 mg Oral BID   Chlorhexidine Gluconate Cloth  6 each Topical Daily   famotidine  20 mg Oral Daily   furosemide  80 mg Intravenous Q8H   Gerhardt's butt cream   Topical BID   insulin aspart  0-15 Units Subcutaneous Q4H   levothyroxine  200 mcg Oral QAC breakfast   metoprolol succinate  12.5 mg Oral Daily   nystatin   Topical BID   mouth rinse  15 mL Mouth Rinse 4 times per day   revefenacin  175 mcg Nebulization Daily   topiramate  50 mg Oral QHS  Continuous Infusions:  ampicillin (OMNIPEN) IV Stopped (08/14/23 0949)     LOS: 5 days    Time spent: 36 min  Alwyn Ren, MD  08/14/2023, 1:02 PM

## 2023-08-14 NOTE — Telephone Encounter (Signed)
 Please make hospital follow-up for evaluation of COPD, OSA/OHS

## 2023-08-14 NOTE — Plan of Care (Signed)
  Problem: Nutritional: Goal: Maintenance of adequate nutrition will improve Outcome: Progressing   Problem: Education: Goal: Knowledge of General Education information will improve Description: Including pain rating scale, medication(s)/side effects and non-pharmacologic comfort measures Outcome: Progressing   Problem: Clinical Measurements: Goal: Cardiovascular complication will be avoided Outcome: Progressing   Problem: Coping: Goal: Level of anxiety will decrease Outcome: Progressing   Problem: Pain Managment: Goal: General experience of comfort will improve and/or be controlled Outcome: Progressing   Problem: Safety: Goal: Ability to remain free from injury will improve Outcome: Progressing

## 2023-08-14 NOTE — [place] FL2 (Signed)
 [place] MEDICAID FL2 LEVEL OF CARE FORM     IDENTIFICATION  Patient Name: Glenda Boone Birthdate: 03/24/1951 Sex: female Admission Date (Current Location): 08/09/2023  [facility] [place] and [place] Number:  Producer, television/film/video and Address:  [facility],  [address], [place] [address]      Provider Number: [id number]  Attending Physician Name and Address:  Alwyn Ren, MD  Relative Name and Phone Number:  HEFLIN,JENNIFER (Sister)  [phone] [facility])    Current Level of Care: Hospital Recommended Level of Care: Skilled Nursing Facility Prior Approval Number:    Date Approved/Denied:   PASRR Number: [id number]  Discharge Plan: SNF    Current Diagnoses: Patient Active Problem List   Diagnosis Date Noted   Enterococcal bacteremia 08/13/2023   Diarrhea 08/13/2023   Pressure injury of skin 08/12/2023   Acute on chronic congestive heart failure (HCC) 08/09/2023   Chronic atrial fibrillation (HCC) 05/30/2022   Closed fracture of left ankle 05/24/2022   Influenza A 05/24/2022   Acute on chronic hypoxic respiratory failure (HCC) 05/23/2022   COPD exacerbation (HCC) 03/28/2022   Acute on chronic heart failure with preserved ejection fraction (HCC) 03/28/2022   Acquired thrombophilia (HCC) 02/12/2020   Current use of long term anticoagulation 02/12/2020   Current every day smoker 02/12/2020   Atrial fibrillation with rapid ventricular response (HCC) 02/11/2020   Hyperthyroidism 02/11/2020   Pneumonia due to COVID-19 virus 01/31/2020   COPD (chronic obstructive pulmonary disease) (HCC) 01/31/2020   Essential hypertension 01/31/2020   Hypoxia 01/31/2020   Obesity, Class III, BMI 40-49.9 (morbid obesity) (HCC) 01/31/2020   Acute respiratory failure with hypoxia (HCC)    Type 2 diabetes mellitus with hyperlipidemia (HCC)    Hypothyroidism    Acute metabolic encephalopathy 12/30/2019   TIA (transient ischemic attack) 09/04/2019   MDD (major  depressive disorder), recurrent, in full remission (HCC) 04/25/2019   Aphagia 03/09/2019   Shingles outbreak 06/21/2015   Chronic bronchitis (HCC) 06/21/2015   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HCC) 11/28/2014   H/O diabetes mellitus 11/28/2014   Anxiety, generalized 11/28/2014   H/O hypercholesterolemia 11/28/2014   H/O: HTN (hypertension) 11/28/2014   H/O: hypothyroidism 11/28/2014   Depression, major, recurrent, moderate (HCC) 11/28/2014   H/O: obesity 11/28/2014   H/O disease 11/28/2014   Neuropathy 11/28/2014    Orientation RESPIRATION BLADDER Height & Weight     Self, Time, Situation, Place  O2 Incontinent, External catheter Weight: (!) 158.7 kg Height:  5\' 4"  (162.6 cm)  BEHAVIORAL SYMPTOMS/MOOD NEUROLOGICAL BOWEL NUTRITION STATUS      Incontinent Diet (carb modified)  AMBULATORY STATUS COMMUNICATION OF NEEDS Skin   Total Care Verbally PU Stage and Appropriate Care (Sacral mid stage 2 pressure injury)   PU Stage 2 Dressing:  (prn)                   Personal Care Assistance Level of Assistance  Total care       Total Care Assistance: Maximum assistance   Functional Limitations Info  Sight, Hearing, Speech Sight Info: Impaired (eyeglasses) Hearing Info: Adequate Speech Info: Adequate    SPECIAL CARE FACTORS FREQUENCY  PT (By licensed PT), OT (By licensed OT)     PT Frequency: 5x/wk OT Frequency: 5x/wk            Contractures Contractures Info: Not present    Additional Factors Info  Code Status, Allergies, Psychotropic Code Status Info: DNR Allergies Info: Codeine, Shellfish-derived Products, Gabapentin, Meperidine Hcl Psychotropic Info:  Buspar 7.5mg  po BID         Current Medications (08/14/2023):  This is the current hospital active medication list Current Facility-Administered Medications  Medication Dose Route Frequency Provider Last Rate Last Admin   acetaminophen (TYLENOL) tablet 650 mg  650 mg Oral Q6H PRN Luiz Iron, NP        Or   acetaminophen (TYLENOL) suppository 650 mg  650 mg Rectal Q4H PRN Luiz Iron, NP       ampicillin (OMNIPEN) 2 g in sodium chloride 0.9 % 100 mL IVPB  2 g Intravenous Q4H Luiz Iron, NP   Stopped at 08/14/23 0949   apixaban (ELIQUIS) tablet 5 mg  5 mg Oral BID Mannam, Praveen, MD   5 mg at 08/14/23 0919   arformoterol (BROVANA) nebulizer solution 15 mcg  15 mcg Nebulization BID Simonne Martinet, NP   15 mcg at 08/14/23 1610   atorvastatin (LIPITOR) tablet 20 mg  20 mg Oral Daily Alwyn Ren, MD   20 mg at 08/14/23 0919   budesonide (PULMICORT) nebulizer solution 0.25 mg  0.25 mg Nebulization BID Simonne Martinet, NP   0.25 mg at 08/14/23 0730   busPIRone (BUSPAR) tablet 7.5 mg  7.5 mg Oral BID Alwyn Ren, MD   7.5 mg at 08/14/23 9604   Chlorhexidine Gluconate Cloth 2 % PADS 6 each  6 each Topical Daily Narda Bonds, MD   6 each at 08/14/23 1008   famotidine (PEPCID) tablet 20 mg  20 mg Oral Daily Alwyn Ren, MD   20 mg at 08/14/23 0919   furosemide (LASIX) injection 80 mg  80 mg Intravenous Q8H Simonne Martinet, NP   80 mg at 08/14/23 5409   Gerhardt's butt cream   Topical BID Narda Bonds, MD   Given at 08/13/23 2302   insulin aspart (novoLOG) injection 0-15 Units  0-15 Units Subcutaneous Q4H Simonne Martinet, NP   2 Units at 08/14/23 0445   levalbuterol (XOPENEX) nebulizer solution 0.63 mg  0.63 mg Nebulization Q6H PRN Alwyn Ren, MD       levothyroxine (SYNTHROID) tablet 200 mcg  200 mcg Oral QAC breakfast Alwyn Ren, MD   200 mcg at 08/14/23 8119   metoprolol succinate (TOPROL-XL) 24 hr tablet 12.5 mg  12.5 mg Oral Daily Narda Bonds, MD   12.5 mg at 08/14/23 0919   metoprolol tartrate (LOPRESSOR) injection 5 mg  5 mg Intravenous Q6H PRN Alwyn Ren, MD   5 mg at 08/12/23 1478   nystatin (MYCOSTATIN/NYSTOP) topical powder   Topical BID Narda Bonds, MD   Given at 08/13/23 2253   ondansetron [facility]) injection  4 mg  4 mg Intravenous Q6H PRN Maryln Gottron, MD       Oral care mouth rinse  15 mL Mouth Rinse 4 times per day Narda Bonds, MD   15 mL at 08/13/23 2253   Oral care mouth rinse  15 mL Mouth Rinse PRN Narda Bonds, MD       revefenacin (YUPELRI) nebulizer solution 175 mcg  175 mcg Nebulization Daily Simonne Martinet, NP   175 mcg at 08/14/23 2956   sodium chloride flush (NS) 0.9 % injection 3 mL  3 mL Intravenous PRN Narda Bonds, MD   3 mL at 08/12/23 0511   topiramate (TOPAMAX) tablet 50 mg  50 mg Oral QHS Kirby Crigler, Mir M, MD   50 mg at  08/13/23 2246     Discharge Medications: Please see discharge summary for a list of discharge medications.  Relevant Imaging Results:  Relevant Lab Results:   Additional Information SSN: [id number]  Howell Rucks, RN

## 2023-08-14 NOTE — Progress Notes (Signed)
 Subjective: Patient still with diarrhea though she feels globally better   Antibiotics:  Anti-infectives (From admission, onward)    Start     Dose/Rate Route Frequency Ordered Stop   08/12/23 0600  ampicillin (OMNIPEN) 2 g in sodium chloride 0.9 % 100 mL IVPB        2 g 300 mL/hr over 20 Minutes Intravenous Every 4 hours 08/12/23 0446         Medications: Scheduled Meds:  apixaban  5 mg Oral BID   arformoterol  15 mcg Nebulization BID   atorvastatin  20 mg Oral Daily   budesonide (PULMICORT) nebulizer solution  0.25 mg Nebulization BID   busPIRone  7.5 mg Oral BID   Chlorhexidine Gluconate Cloth  6 each Topical Daily   famotidine  20 mg Oral Daily   furosemide  80 mg Intravenous BID   Gerhardt's butt cream   Topical BID   insulin aspart  0-15 Units Subcutaneous Q4H   levothyroxine  200 mcg Oral QAC breakfast   metoprolol succinate  12.5 mg Oral Daily   nystatin   Topical BID   mouth rinse  15 mL Mouth Rinse 4 times per day   potassium chloride  40 mEq Oral Once   revefenacin  175 mcg Nebulization Daily   topiramate  50 mg Oral QHS   Continuous Infusions:  ampicillin (OMNIPEN) IV Stopped (08/14/23 1354)   PRN Meds:.acetaminophen **OR** acetaminophen, levalbuterol, metoprolol tartrate, [DISCONTINUED] ondansetron **OR** ondansetron (ZOFRAN) IV, mouth rinse, oxyCODONE, sodium chloride flush    Objective: Weight change:   Intake/Output Summary (Last 24 hours) at 08/14/2023 1550 Last data filed at 08/14/2023 1513 Gross per 24 hour  Intake 537.83 ml  Output 6165 ml  Net -5627.17 ml   Blood pressure (!) 135/58, pulse 78, temperature 98.2 F (36.8 C), temperature source Oral, resp. rate (!) 24, height 5\' 4"  (1.626 m), weight (!) 158.7 kg, SpO2 94%. Temp:  [98 F (36.7 C)-99 F (37.2 C)] 98.2 F (36.8 C) (03/21 1200) Pulse Rate:  [60-78] 78 (03/21 0900) Resp:  [10-28] 24 (03/21 0900) BP: (111-156)/(54-89) 135/58 (03/21 0900) SpO2:  [94 %-100 %] 94 %  (03/21 0900) FiO2 (%):  [50 %] 50 % (03/21 0055)  Physical Exam: Physical Exam Constitutional:      General: She is not in acute distress.    Appearance: She is well-developed. She is not diaphoretic.  HENT:     Head: Normocephalic and atraumatic.     Right Ear: External ear normal.     Left Ear: External ear normal.     Mouth/Throat:     Pharynx: No oropharyngeal exudate.  Eyes:     General: No scleral icterus.    Conjunctiva/sclera: Conjunctivae normal.     Pupils: Pupils are equal, round, and reactive to light.  Cardiovascular:     Rate and Rhythm: Normal rate and regular rhythm.     Heart sounds: Normal heart sounds. No murmur heard.    No friction rub. No gallop.  Pulmonary:     Effort: Pulmonary effort is normal. No respiratory distress.     Breath sounds: Normal breath sounds. No stridor. No wheezing, rhonchi or rales.  Abdominal:     General: Bowel sounds are normal. There is no distension.     Palpations: Abdomen is soft.  Musculoskeletal:        General: No tenderness. Normal range of motion.  Lymphadenopathy:     Cervical: No cervical adenopathy.  Skin:    General: Skin is warm and dry.     Coloration: Skin is not pale.     Findings: No erythema or rash.  Neurological:     General: No focal deficit present.     Mental Status: She is alert and oriented to person, place, and time.     Motor: No abnormal muscle tone.     Coordination: Coordination normal.  Psychiatric:        Mood and Affect: Mood normal.        Behavior: Behavior normal.        Thought Content: Thought content normal.        Judgment: Judgment normal.      CBC:    BMET Recent Labs    08/13/23 0247 08/14/23 0237  NA 141 137  K 3.5 3.3*  CL 92* 90*  CO2 38* 35*  GLUCOSE 103* 110*  BUN 32* 32*  CREATININE 1.28* 1.49*  CALCIUM 7.9* 7.8*     Liver Panel  No results for input(s): "PROT", "ALBUMIN", "AST", "ALT", "ALKPHOS", "BILITOT", "BILIDIR", "IBILI" in the last 72  hours.      Sedimentation Rate No results for input(s): "ESRSEDRATE" in the last 72 hours. C-Reactive Protein No results for input(s): "CRP" in the last 72 hours.  Micro Results: Recent Results (from the past 720 hours)  Resp panel by RT-PCR (RSV, Flu A&B, Covid) Anterior Nasal Swab     Status: None   Collection Time: 08/10/23 10:34 AM   Specimen: Anterior Nasal Swab  Result Value Ref Range Status   SARS Coronavirus 2 by RT PCR NEGATIVE NEGATIVE Final    Comment: (NOTE) SARS-CoV-2 target nucleic acids are NOT DETECTED.  The SARS-CoV-2 RNA is generally detectable in upper respiratory specimens during the acute phase of infection. The lowest concentration of SARS-CoV-2 viral copies this assay can detect is 138 copies/mL. A negative result does not preclude SARS-Cov-2 infection and should not be used as the sole basis for treatment or other patient management decisions. A negative result may occur with  improper specimen collection/handling, submission of specimen other than nasopharyngeal swab, presence of viral mutation(s) within the areas targeted by this assay, and inadequate number of viral copies(<138 copies/mL). A negative result must be combined with clinical observations, patient history, and epidemiological information. The expected result is Negative.  Fact Sheet for Patients:  BloggerCourse.com  Fact Sheet for Healthcare Providers:  SeriousBroker.it  This test is no t yet approved or cleared by the [place] FDA and  has been authorized for detection and/or diagnosis of SARS-CoV-2 by FDA under an Emergency Use Authorization (EUA). This EUA will remain  in effect (meaning this test can be used) for the duration of the COVID-19 declaration under Section 564(b)(1) of the Act, 21 U.S.C.section 360bbb-3(b)(1), unless the authorization is terminated  or revoked sooner.       Influenza A by PCR NEGATIVE NEGATIVE  Final   Influenza B by PCR NEGATIVE NEGATIVE Final    Comment: (NOTE) The Xpert Xpress SARS-CoV-2/FLU/RSV plus assay is intended as an aid in the diagnosis of influenza from Nasopharyngeal swab specimens and should not be used as a sole basis for treatment. Nasal washings and aspirates are unacceptable for Xpert Xpress SARS-CoV-2/FLU/RSV testing.  Fact Sheet for Patients: BloggerCourse.com  Fact Sheet for Healthcare Providers: SeriousBroker.it  This test is not yet approved or cleared by the [place] FDA and has been authorized for detection and/or diagnosis of SARS-CoV-2 by FDA under an Emergency  Use Authorization (EUA). This EUA will remain in effect (meaning this test can be used) for the duration of the COVID-19 declaration under Section 564(b)(1) of the Act, 21 U.S.C. section 360bbb-3(b)(1), unless the authorization is terminated or revoked.     Resp Syncytial Virus by PCR NEGATIVE NEGATIVE Final    Comment: (NOTE) Fact Sheet for Patients: BloggerCourse.com  Fact Sheet for Healthcare Providers: SeriousBroker.it  This test is not yet approved or cleared by the [place] FDA and has been authorized for detection and/or diagnosis of SARS-CoV-2 by FDA under an Emergency Use Authorization (EUA). This EUA will remain in effect (meaning this test can be used) for the duration of the COVID-19 declaration under Section 564(b)(1) of the Act, 21 U.S.C. section 360bbb-3(b)(1), unless the authorization is terminated or revoked.  Performed at [facility], [address] [place], [place] [address]   Respiratory (~20 pathogens) panel by PCR     Status: None   Collection Time: 08/10/23 10:34 AM   Specimen: Nasopharyngeal Swab; Respiratory  Result Value Ref Range Status   Adenovirus NOT DETECTED NOT DETECTED Final   Coronavirus 229E NOT DETECTED NOT  DETECTED Final    Comment: (NOTE) The Coronavirus on the Respiratory Panel, DOES NOT test for the novel  Coronavirus (2019 nCoV)    Coronavirus HKU1 NOT DETECTED NOT DETECTED Final   Coronavirus NL63 NOT DETECTED NOT DETECTED Final   Coronavirus OC43 NOT DETECTED NOT DETECTED Final   Metapneumovirus NOT DETECTED NOT DETECTED Final   Rhinovirus / Enterovirus NOT DETECTED NOT DETECTED Final   Influenza A NOT DETECTED NOT DETECTED Final   Influenza B NOT DETECTED NOT DETECTED Final   Parainfluenza Virus 1 NOT DETECTED NOT DETECTED Final   Parainfluenza Virus 2 NOT DETECTED NOT DETECTED Final   Parainfluenza Virus 3 NOT DETECTED NOT DETECTED Final   Parainfluenza Virus 4 NOT DETECTED NOT DETECTED Final   Respiratory Syncytial Virus NOT DETECTED NOT DETECTED Final   Bordetella pertussis NOT DETECTED NOT DETECTED Final   Bordetella Parapertussis NOT DETECTED NOT DETECTED Final   Chlamydophila pneumoniae NOT DETECTED NOT DETECTED Final   Mycoplasma pneumoniae NOT DETECTED NOT DETECTED Final    Comment: Performed at [facility], [address], [place], [place] [address]  MRSA Next Gen by PCR, Nasal     Status: None   Collection Time: 08/10/23 11:31 AM   Specimen: Nasal Mucosa; Nasal Swab  Result Value Ref Range Status   MRSA by PCR Next Gen NOT DETECTED NOT DETECTED Final    Comment: (NOTE) The GeneXpert MRSA Assay (FDA approved for NASAL specimens only), is one component of a comprehensive MRSA colonization surveillance program. It is not intended to diagnose MRSA infection nor to guide or monitor treatment for MRSA infections. Test performance is not FDA approved in patients less than 33 years old. Performed at [facility], [address] [place], [place] [address]   Culture, blood (Routine X 2) w Reflex to ID Panel     Status: Abnormal (Preliminary result)   Collection Time: 08/11/23  8:44 AM   Specimen: BLOOD LEFT ARM  Result Value Ref Range Status    Specimen Description   Final    BLOOD LEFT ARM Performed at [facility], [address], [place], [place] [address]    Special Requests   Final    BOTTLES DRAWN AEROBIC AND ANAEROBIC Blood Culture adequate volume Performed at [facility], [address] [place],  [place] [address]    Culture  Setup Time   Final    GRAM POSITIVE COCCI IN PAIRS AND CHAINS IN BOTH AEROBIC AND ANAEROBIC BOTTLES Organism ID to follow CRITICAL RESULT CALLED TO, READ BACK BY AND VERIFIED WITH: E JACKSON,[email or web address]  08/12/23 MK    Culture (A)  Final    ENTEROCOCCUS FAECALIS STREPTOCOCCUS MITIS/ORALIS SUSCEPTIBILITIES TO FOLLOW Performed at [facility], [address], [place], [place] [address]    Report Status PENDING  Incomplete   Organism ID, Bacteria ENTEROCOCCUS FAECALIS  Final      Susceptibility   Enterococcus faecalis - MIC*    AMPICILLIN <=2 SENSITIVE Sensitive     VANCOMYCIN 1 SENSITIVE Sensitive     GENTAMICIN SYNERGY SENSITIVE Sensitive     * ENTEROCOCCUS FAECALIS  Culture, blood (Routine X 2) w Reflex to ID Panel     Status: None (Preliminary result)   Collection Time: 08/11/23  8:44 AM   Specimen: BLOOD RIGHT HAND  Result Value Ref Range Status   Specimen Description   Final    BLOOD RIGHT HAND Performed at [facility], [address], [place], [place] [address]    Special Requests   Final    BOTTLES DRAWN AEROBIC ONLY Blood Culture results may not be optimal due to an inadequate volume of blood received in culture bottles Performed at [facility], [address], [place], [place] [address]    Culture   Final    NO GROWTH 3 DAYS Performed at [facility], [address], [place], [place] [address]    Report Status PENDING  Incomplete  Blood Culture ID Panel (Reflexed)     Status: Abnormal   Collection Time: 08/11/23  8:44 AM  Result Value Ref Range Status   Enterococcus faecalis DETECTED (A) NOT DETECTED Final     Comment: CRITICAL RESULT CALLED TO, READ BACK BY AND VERIFIED WITH: E JACKSON,[email or web address]  08/12/23 MK    Enterococcus Faecium NOT DETECTED NOT DETECTED Final   Listeria monocytogenes NOT DETECTED NOT DETECTED Final   Staphylococcus species NOT DETECTED NOT DETECTED Final   Staphylococcus aureus (BCID) NOT DETECTED NOT DETECTED Final   Staphylococcus epidermidis NOT DETECTED NOT DETECTED Final   Staphylococcus lugdunensis NOT DETECTED NOT DETECTED Final   Streptococcus species NOT DETECTED NOT DETECTED Final   Streptococcus agalactiae NOT DETECTED NOT DETECTED Final   Streptococcus pneumoniae NOT DETECTED NOT DETECTED Final   Streptococcus pyogenes NOT DETECTED NOT DETECTED Final   A.calcoaceticus-baumannii NOT DETECTED NOT DETECTED Final   Bacteroides fragilis NOT DETECTED NOT DETECTED Final   Enterobacterales NOT DETECTED NOT DETECTED Final   Enterobacter cloacae complex NOT DETECTED NOT DETECTED Final   Escherichia coli NOT DETECTED NOT DETECTED Final   Klebsiella aerogenes NOT DETECTED NOT DETECTED Final   Klebsiella oxytoca NOT DETECTED NOT DETECTED Final   Klebsiella pneumoniae NOT DETECTED NOT DETECTED Final   Proteus species NOT DETECTED NOT DETECTED Final   Salmonella species NOT DETECTED NOT DETECTED Final   Serratia marcescens NOT DETECTED NOT DETECTED Final   Haemophilus influenzae NOT DETECTED NOT DETECTED Final   Neisseria meningitidis NOT DETECTED NOT DETECTED Final   Pseudomonas aeruginosa NOT DETECTED NOT DETECTED Final   Stenotrophomonas maltophilia NOT DETECTED NOT DETECTED Final   Candida albicans NOT DETECTED NOT DETECTED Final   Candida auris NOT DETECTED NOT DETECTED Final   Candida glabrata NOT DETECTED NOT DETECTED Final   Candida krusei NOT DETECTED NOT DETECTED Final   Candida  parapsilosis NOT DETECTED NOT DETECTED Final   Candida tropicalis NOT DETECTED NOT DETECTED Final   Cryptococcus neoformans/gattii NOT DETECTED NOT DETECTED Final   Vancomycin  resistance NOT DETECTED NOT DETECTED Final    Comment: Performed at [facility], [address] [place], [place] [address]  C Difficile Quick Screen w PCR reflex     Status: None   Collection Time: 08/11/23  1:55 PM   Specimen: STOOL  Result Value Ref Range Status   C Diff antigen NEGATIVE NEGATIVE Final   C Diff toxin NEGATIVE NEGATIVE Final   C Diff interpretation No C. difficile detected.  Final    Comment: Performed at [facility], [address], [place], [place] [address]  Culture, blood (Routine X 2) w Reflex to ID Panel     Status: None (Preliminary result)   Collection Time: 08/12/23 11:04 AM   Specimen: BLOOD LEFT HAND  Result Value Ref Range Status   Specimen Description   Final    BLOOD LEFT HAND Performed at [facility], [address], [place], [place] [address]    Special Requests   Final    BOTTLES DRAWN AEROBIC AND ANAEROBIC Blood Culture results may not be optimal due to an inadequate volume of blood received in culture bottles Performed at [facility], [address], [place], [place] [address]    Culture   Final    NO GROWTH 2 DAYS Performed at [facility], [address], [place], [place] [address]    Report Status PENDING  Incomplete  Culture, blood (Routine X 2) w Reflex to ID Panel     Status: None (Preliminary result)   Collection Time: 08/12/23 11:12 AM   Specimen: BLOOD RIGHT HAND  Result Value Ref Range Status   Specimen Description   Final    BLOOD RIGHT HAND Performed at [facility], [address], [place], [place] [address]    Special Requests   Final    BOTTLES DRAWN AEROBIC AND ANAEROBIC Blood Culture results may not be optimal due to an inadequate volume of blood received in culture bottles Performed at [facility], [address] [place], [place] [address]    Culture   Final    NO GROWTH 2 DAYS Performed at [facility], [address],  [place], [place] [address]    Report Status PENDING  Incomplete  Gastrointestinal Panel by PCR , Stool     Status: Abnormal   Collection Time: 08/12/23  3:14 PM   Specimen: Stool  Result Value Ref Range Status   Campylobacter species NOT DETECTED NOT DETECTED Final   Plesimonas shigelloides NOT DETECTED NOT DETECTED Final   Salmonella species NOT DETECTED NOT DETECTED Final   Yersinia enterocolitica NOT DETECTED NOT DETECTED Final   Vibrio species NOT DETECTED NOT DETECTED Final   Vibrio cholerae NOT DETECTED NOT DETECTED Final   Enteroaggregative E coli (EAEC) NOT DETECTED NOT DETECTED Final   Enteropathogenic E coli (EPEC) NOT DETECTED NOT DETECTED Final   Enterotoxigenic E coli (ETEC) NOT DETECTED NOT DETECTED Final   Shiga like toxin producing E coli (STEC) NOT DETECTED NOT DETECTED Final   Shigella/Enteroinvasive E coli (EIEC) NOT DETECTED NOT DETECTED Final   Cryptosporidium NOT DETECTED NOT DETECTED Final   Cyclospora cayetanensis NOT DETECTED NOT DETECTED Final   Entamoeba histolytica NOT DETECTED NOT DETECTED Final   Giardia lamblia NOT DETECTED NOT DETECTED Final  Adenovirus F40/41 NOT DETECTED NOT DETECTED Final   Astrovirus DETECTED (A) NOT DETECTED Final   Norovirus GI/GII NOT DETECTED NOT DETECTED Final   Rotavirus A NOT DETECTED NOT DETECTED Final   Sapovirus (I, II, IV, and V) NOT DETECTED NOT DETECTED Final    Comment: Performed at [facility], [address], [place], [place] [address]    Studies/Results: No results found.    Assessment/Plan:  INTERVAL HISTORY: repeat blood cultures still with no growth   Principal Problem:   Enterococcal bacteremia Active Problems:   Acute on chronic congestive heart failure (HCC)   Pressure injury of skin   Diarrhea    Glenda Boone is a 73 y.o. female with history of morbid obesity and generally bedbound status admitted with shortness of breath.  She was improving from standpoint of her heart failure  exacerbation but then began having diarrhea and was febrile blood cultures taken yielded Enterococcus faecalis and now strep intermedius  in 1 of 2 sites taken.  Her 2D echocardiogram was unrevealing though her body habitus makes it fairly low value study.  Working diagnosis is that she had gut translocation of the E faecalis into the blood in the context of her diarrheal illness.  Will continue ampicillin for now.  I do not think we would hazard a TEE in this patient who has a low likelihood of having endocarditis.   Diarrhea: Her C. difficile testing was negative she has a GI pathogen panel + for astrovirus  enteric precautions  CRITICAL CARE Performed by: Paulette Blanch Dam   Total critical care time: 30  minutes  Critical care time was exclusive of separately billable procedures and treating other patients.  Critical care was necessary to treat or prevent imminent or life-threatening deterioration.  Critical care was time spent personally by me on the following activities: development of treatment plan with patient and/or surrogate as well as nursing, discussions with consultants, evaluation of patient's response to treatment, examination of patient, obtaining history from patient or surrogate, ordering and performing treatments and interventions, ordering and review of laboratory studies, ordering and review of radiographic studies, pulse oximetry and re-evaluation of patient's condition.  Evaluation of the patient requires complex antimicrobial therapy evaluation, counseling , isolation needs to reduce disease transmission and risk assessment and mitigation.   I am available for questions this weekend and Dr. Renold Don will take over the service on Monday.   LOS: 5 days   Acey Lav 08/14/2023, 3:50 PM

## 2023-08-15 DIAGNOSIS — B952 Enterococcus as the cause of diseases classified elsewhere: Secondary | ICD-10-CM | POA: Diagnosis not present

## 2023-08-15 DIAGNOSIS — R7881 Bacteremia: Secondary | ICD-10-CM | POA: Diagnosis not present

## 2023-08-15 LAB — BASIC METABOLIC PANEL
Anion gap: 11 (ref 5–15)
BUN: 36 mg/dL — ABNORMAL HIGH (ref 8–23)
CO2: 36 mmol/L — ABNORMAL HIGH (ref 22–32)
Calcium: 8.2 mg/dL — ABNORMAL LOW (ref 8.9–10.3)
Chloride: 92 mmol/L — ABNORMAL LOW (ref 98–111)
Creatinine, Ser: 1.51 mg/dL — ABNORMAL HIGH (ref 0.44–1.00)
GFR, Estimated: 36 mL/min — ABNORMAL LOW (ref >60)
Glucose, Bld: 117 mg/dL — ABNORMAL HIGH (ref 70–99)
Potassium: 3.8 mmol/L (ref 3.5–5.1)
Sodium: 139 mmol/L (ref 135–145)

## 2023-08-15 LAB — GLUCOSE, CAPILLARY
Glucose-Capillary: 110 mg/dL — ABNORMAL HIGH (ref 70–99)
Glucose-Capillary: 111 mg/dL — ABNORMAL HIGH (ref 70–99)
Glucose-Capillary: 231 mg/dL — ABNORMAL HIGH (ref 70–99)
Glucose-Capillary: 203 mg/dL — ABNORMAL HIGH (ref 70–99)
Glucose-Capillary: 148 mg/dL — ABNORMAL HIGH (ref 70–99)

## 2023-08-15 LAB — HCV AB W REFLEX TO QUANT PCR: HCV Ab: NONREACTIVE

## 2023-08-15 LAB — CULTURE, BLOOD (ROUTINE X 2): Special Requests: ADEQUATE

## 2023-08-15 LAB — HCV INTERPRETATION

## 2023-08-15 MED ORDER — HYDROXYZINE HCL 10 MG PO TABS
10.0000 mg | ORAL_TABLET | Freq: Three times a day (TID) | ORAL | Status: DC
  Administered 2023-08-15 – 2023-08-17 (×8): 10 mg via ORAL
  Filled 2023-08-15 (×9): qty 1

## 2023-08-15 MED ORDER — OXYCODONE HCL 5 MG PO TABS
5.0000 mg | ORAL_TABLET | Freq: Four times a day (QID) | ORAL | Status: DC | PRN
  Administered 2023-08-15 – 2023-08-17 (×3): 5 mg via ORAL
  Filled 2023-08-15 (×3): qty 1

## 2023-08-15 MED ORDER — FUROSEMIDE 10 MG/ML IJ SOLN
40.0000 mg | Freq: Two times a day (BID) | INTRAMUSCULAR | Status: DC
  Administered 2023-08-16 – 2023-08-17 (×3): 40 mg via INTRAVENOUS
  Filled 2023-08-15 (×3): qty 4

## 2023-08-15 MED ORDER — POLYVINYL ALCOHOL 1.4 % OP SOLN
1.0000 [drp] | OPHTHALMIC | Status: DC | PRN
  Administered 2023-08-15 – 2023-08-16 (×3): 1 [drp] via OPHTHALMIC
  Filled 2023-08-15: qty 15

## 2023-08-15 NOTE — Progress Notes (Signed)
 PROGRESS NOTE    AVRIE KEDZIERSKI  UEA:[id number] DOB: [birth year] DOA: 08/09/2023 PCP: [facility]  Brief Narrative:73 y.o. female with a history of morbid obesity, bedbound, hypothyroidism, diabetes mellitus type 2, COPD, chronic respiratory failure on 3 L via nasal cannula, heart failure with preserved EF, CKD stage IIIb, depression, hyperlipidemia, paroxysmal atrial fibrillation. Patient presented secondary to shortness of breath and leg swelling with concern for possible acute heart failure. Patient started on IV Lasix. Echocardiogram obtained which is significant for preserved LVEF and grade 2 diastolic dysfunction. During hospitalization, patient was noted to have increasing lethargy requiring significant oxygen with irregular respiratory pattern. ABG obtained and was significant for evidence of acute hypercapnia with associated acidemia. Patient transferred to ICU, started on BiPAP, PCCM consulted.   Assessment & Plan:   Principal Problem:   Enterococcal bacteremia Active Problems:   Acute on chronic congestive heart failure (HCC)   Pressure injury of skin   Diarrhea   Streptococcal bacteremia  Acute on chronic HFpEF BNP of 215.3 with chest imaging suggestive of possible fluid. Transthoracic Echocardiogram ordered and is significant for LVEF of 70% with associated grade 2 diastolic dysfunction.  Right ventricular function is normal. -Lasix 80 mg IV every 8 changed to twice daily 3/21, then changed to 40 mg twice a day on 3/22 Repeat chest x-ray 3/21 resolved CHF Cr 1.51 from 1.49 from 1.28 up trending will monitor closely   Acute on chronic respiratory failure with hypoxia Acute respiratory failure with hypercapnia-resolved with BiPAP.  However she has not been using BiPAP for the last 2 nights saying that she is anxious cannot keep it all night and that she feels fine without it.  Try to explain to her that she needs to do use it every night in order to avoid  going back into the same situation.   ABG 7.1 3/123/69 was placed on BiPAP with improvement in mental status on the 17th. NIV on discharge Did not use BiPAP last 2 nights Encourage her to use BiPAP to avoid acute hypercapnia   fever-blood culture positive for Enterococcus on ampicillin seen by ID repeat cultures ordered.  Stool C. difficile negative.  COVID RSV and flu negative. Check stool pathogen panel positive for astrovirus!  COPD continue Xopenex Hypothyroidism -Continue levothyroxine 200 mcg daily   Primary hypertension -Continue amlodipine   Paroxysmal atrial fibrillation-continue Lopressor, Toprol, Cardizem as needed continue Eliquis  Diabetes mellitus type 2 Well controlled for age with last hemoglobin A1C of 7.8%. Patient is on Farxiga as an outpatient. -Continue carb modified diet -Continue SSI -CBG (last 3)  Recent Labs    08/15/23 0447 08/15/23 0720 08/15/23 1130  GLUCAP 110* 111* 231*     Acute macrocytic anemia Unclear etiology. -Check anemia panel   CKD stage IIIb Stable.   Depression -Continue Wellbutrin and BuSpar   Hyperlipidemia -Continue Lipitor   Obesity, class III No current weight, however body habitus consistent with at least obesity class III. Pressure Injury 08/10/23 Sacrum Mid Stage 2 -  Partial thickness loss of dermis presenting as a shallow open injury with a red, pink wound bed without slough. (Active)  08/10/23 1449  Location: Sacrum  Location Orientation: Mid  Staging: Stage 2 -  Partial thickness loss of dermis presenting as a shallow open injury with a red, pink wound bed without slough.  Wound Description (Comments):   Present on Admission: Yes  Dressing Type Foam - Lift dressing to assess site every shift 08/14/23 2030  Estimated body mass index is 60.06 kg/m as calculated from the following:   Height as of this encounter: 5\' 4"  (1.626 m).   Weight as of this encounter: 158.7 kg.  DVT prophylaxis:eliquis Code  Status: dnr Family Communication: None at bedside  disposition Plan:  Status is: Inpatient Remains inpatient appropriate because: Acute illness   Consultants:  PCCM  Procedures: None  Antimicrobials: Ampicillin   Subjective:  Again she did not use BiPAP last night in spite of ongoing encouragement.   Objective: Vitals:   08/15/23 0755 08/15/23 0800 08/15/23 0900 08/15/23 0905  BP:   135/66   Pulse:   73   Resp:  18 15   Temp:      TempSrc:      SpO2: 99%  90% 93%  Weight:      Height:        Intake/Output Summary (Last 24 hours) at 08/15/2023 1257 Last data filed at 08/15/2023 0934 Gross per 24 hour  Intake 979.9 ml  Output 3310 ml  Net -2330.1 ml   Filed Weights   08/10/23 1201 08/12/23 0400  Weight: (!) 163.4 kg (!) 158.7 kg    Examination:  General exam: Appears in no acute distress Respiratory system: Clear to auscultation. Respiratory effort normal. Cardiovascular system: Tachy irregularly irregular  gastrointestinal system: Abdomen is distended, soft and nontender. No organomegaly or masses felt. Normal bowel sounds heard. Central nervous system: Alert and oriented.  Extremities: 1+ edema    Data Reviewed: I have personally reviewed following labs and imaging studies  CBC: Recent Labs  Lab 08/09/23 1123 08/10/23 0429 08/11/23 0019 08/12/23 0257  WBC 11.0* 11.8* 9.6 9.3  NEUTROABS 9.1*  --   --   --   HGB 9.7* 9.8* 9.0* 8.9*  HCT 35.0* 35.6* 32.4* 30.5*  MCV 106.1* 109.5* 106.9* 103.4*  PLT 250 260 227 235   Basic Metabolic Panel: Recent Labs  Lab 08/11/23 0019 08/12/23 0257 08/13/23 0247 08/14/23 0237 08/15/23 0543  NA 145 139 141 137 139  K 3.9 3.2* 3.5 3.3* 3.8  CL 98 92* 92* 90* 92*  CO2 37* 37* 38* 35* 36*  GLUCOSE 113* 121* 103* 110* 117*  BUN 30* 34* 32* 32* 36*  CREATININE 1.32* 1.23* 1.28* 1.49* 1.51*  CALCIUM 8.0* 8.0* 7.9* 7.8* 8.2*  MG  --  1.9  --   --   --    GFR: Estimated Creatinine Clearance: 49.7 mL/min (A) (by  C-G formula based on SCr of 1.51 mg/dL (H)). Liver Function Tests: Recent Labs  Lab 08/09/23 1123 08/11/23 0752  AST 12*  --   ALT 14  --   ALKPHOS 100  --   BILITOT 0.5  --   PROT 7.0  --   ALBUMIN 3.3* 2.8*   No results for input(s): "LIPASE", "AMYLASE" in the last 168 hours. No results for input(s): "AMMONIA" in the last 168 hours. Coagulation Profile: No results for input(s): "INR", "PROTIME" in the last 168 hours. Cardiac Enzymes: No results for input(s): "CKTOTAL", "CKMB", "CKMBINDEX", "TROPONINI" in the last 168 hours. BNP (last 3 results) No results for input(s): "PROBNP" in the last 8760 hours. HbA1C: No results for input(s): "HGBA1C" in the last 72 hours.  CBG: Recent Labs  Lab 08/14/23 2005 08/14/23 2349 08/15/23 0447 08/15/23 0720 08/15/23 1130  GLUCAP 214* 182* 110* 111* 231*   Lipid Profile: No results for input(s): "CHOL", "HDL", "LDLCALC", "TRIG", "CHOLHDL", "LDLDIRECT" in the last 72 hours. Thyroid Function Tests: No results for  input(s): "TSH", "T4TOTAL", "FREET4", "T3FREE", "THYROIDAB" in the last 72 hours.  Anemia Panel: No results for input(s): "VITAMINB12", "FOLATE", "FERRITIN", "TIBC", "IRON", "RETICCTPCT" in the last 72 hours.  Sepsis Labs: No results for input(s): "PROCALCITON", "LATICACIDVEN" in the last 168 hours.  Recent Results (from the past 240 hours)  Resp panel by RT-PCR (RSV, Flu A&B, Covid) Anterior Nasal Swab     Status: None   Collection Time: 08/10/23 10:34 AM   Specimen: Anterior Nasal Swab  Result Value Ref Range Status   SARS Coronavirus 2 by RT PCR NEGATIVE NEGATIVE Final    Comment: (NOTE) SARS-CoV-2 target nucleic acids are NOT DETECTED.  The SARS-CoV-2 RNA is generally detectable in upper respiratory specimens during the acute phase of infection. The lowest concentration of SARS-CoV-2 viral copies this assay can detect is 138 copies/mL. A negative result does not preclude SARS-Cov-2 infection and should not be used  as the sole basis for treatment or other patient management decisions. A negative result may occur with  improper specimen collection/handling, submission of specimen other than nasopharyngeal swab, presence of viral mutation(s) within the areas targeted by this assay, and inadequate number of viral copies(<138 copies/mL). A negative result must be combined with clinical observations, patient history, and epidemiological information. The expected result is Negative.  Fact Sheet for Patients:  BloggerCourse.com  Fact Sheet for Healthcare Providers:  SeriousBroker.it  This test is no t yet approved or cleared by the [place] FDA and  has been authorized for detection and/or diagnosis of SARS-CoV-2 by FDA under an Emergency Use Authorization (EUA). This EUA will remain  in effect (meaning this test can be used) for the duration of the COVID-19 declaration under Section 564(b)(1) of the Act, 21 U.S.C.section 360bbb-3(b)(1), unless the authorization is terminated  or revoked sooner.       Influenza A by PCR NEGATIVE NEGATIVE Final   Influenza B by PCR NEGATIVE NEGATIVE Final    Comment: (NOTE) The Xpert Xpress SARS-CoV-2/FLU/RSV plus assay is intended as an aid in the diagnosis of influenza from Nasopharyngeal swab specimens and should not be used as a sole basis for treatment. Nasal washings and aspirates are unacceptable for Xpert Xpress SARS-CoV-2/FLU/RSV testing.  Fact Sheet for Patients: BloggerCourse.com  Fact Sheet for Healthcare Providers: SeriousBroker.it  This test is not yet approved or cleared by the [place] FDA and has been authorized for detection and/or diagnosis of SARS-CoV-2 by FDA under an Emergency Use Authorization (EUA). This EUA will remain in effect (meaning this test can be used) for the duration of the COVID-19 declaration under Section 564(b)(1)  of the Act, 21 U.S.C. section 360bbb-3(b)(1), unless the authorization is terminated or revoked.     Resp Syncytial Virus by PCR NEGATIVE NEGATIVE Final    Comment: (NOTE) Fact Sheet for Patients: BloggerCourse.com  Fact Sheet for Healthcare Providers: SeriousBroker.it  This test is not yet approved or cleared by the [place] FDA and has been authorized for detection and/or diagnosis of SARS-CoV-2 by FDA under an Emergency Use Authorization (EUA). This EUA will remain in effect (meaning this test can be used) for the duration of the COVID-19 declaration under Section 564(b)(1) of the Act, 21 U.S.C. section 360bbb-3(b)(1), unless the authorization is terminated or revoked.  Performed at [facility], [address] [place], [place] [address]   Respiratory (~20 pathogens) panel by PCR     Status: None   Collection Time: 08/10/23 10:34 AM   Specimen: Nasopharyngeal Swab; Respiratory  Result Value  Ref Range Status   Adenovirus NOT DETECTED NOT DETECTED Final   Coronavirus 229E NOT DETECTED NOT DETECTED Final    Comment: (NOTE) The Coronavirus on the Respiratory Panel, DOES NOT test for the novel  Coronavirus (2019 nCoV)    Coronavirus HKU1 NOT DETECTED NOT DETECTED Final   Coronavirus NL63 NOT DETECTED NOT DETECTED Final   Coronavirus OC43 NOT DETECTED NOT DETECTED Final   Metapneumovirus NOT DETECTED NOT DETECTED Final   Rhinovirus / Enterovirus NOT DETECTED NOT DETECTED Final   Influenza A NOT DETECTED NOT DETECTED Final   Influenza B NOT DETECTED NOT DETECTED Final   Parainfluenza Virus 1 NOT DETECTED NOT DETECTED Final   Parainfluenza Virus 2 NOT DETECTED NOT DETECTED Final   Parainfluenza Virus 3 NOT DETECTED NOT DETECTED Final   Parainfluenza Virus 4 NOT DETECTED NOT DETECTED Final   Respiratory Syncytial Virus NOT DETECTED NOT DETECTED Final   Bordetella pertussis NOT DETECTED NOT DETECTED Final    Bordetella Parapertussis NOT DETECTED NOT DETECTED Final   Chlamydophila pneumoniae NOT DETECTED NOT DETECTED Final   Mycoplasma pneumoniae NOT DETECTED NOT DETECTED Final    Comment: Performed at [facility], [address], [place], [place] [address]  MRSA Next Gen by PCR, Nasal     Status: None   Collection Time: 08/10/23 11:31 AM   Specimen: Nasal Mucosa; Nasal Swab  Result Value Ref Range Status   MRSA by PCR Next Gen NOT DETECTED NOT DETECTED Final    Comment: (NOTE) The GeneXpert MRSA Assay (FDA approved for NASAL specimens only), is one component of a comprehensive MRSA colonization surveillance program. It is not intended to diagnose MRSA infection nor to guide or monitor treatment for MRSA infections. Test performance is not FDA approved in patients less than 31 years old. Performed at [facility], [address] [place], [place] [address]   Culture, blood (Routine X 2) w Reflex to ID Panel     Status: Abnormal   Collection Time: 08/11/23  8:44 AM   Specimen: BLOOD LEFT ARM  Result Value Ref Range Status   Specimen Description   Final    BLOOD LEFT ARM Performed at [facility], [address], [place], [place] [address]    Special Requests   Final    BOTTLES DRAWN AEROBIC AND ANAEROBIC Blood Culture adequate volume Performed at [facility], [address] [place], [place] [address]    Culture  Setup Time   Final    GRAM POSITIVE COCCI IN PAIRS AND CHAINS IN BOTH AEROBIC AND ANAEROBIC BOTTLES Organism ID to follow CRITICAL RESULT CALLED TO, READ BACK BY AND VERIFIED WITH: E JACKSON,[email or web address]  08/12/23 MK Performed at [facility], [address] [place], [place] [address]    Culture (A)  Final    ENTEROCOCCUS FAECALIS STREPTOCOCCUS MITIS/ORALIS    Report Status 08/15/2023 FINAL  Final   Organism ID, Bacteria ENTEROCOCCUS FAECALIS  Final   Organism ID, Bacteria STREPTOCOCCUS MITIS/ORALIS  Final       Susceptibility   Enterococcus faecalis - MIC*    AMPICILLIN <=2 SENSITIVE Sensitive     VANCOMYCIN 1 SENSITIVE Sensitive     GENTAMICIN SYNERGY SENSITIVE Sensitive     * ENTEROCOCCUS FAECALIS   Streptococcus mitis/oralis - MIC*    PENICILLIN <=0.06 SENSITIVE Sensitive     CEFTRIAXONE <=0.12 SENSITIVE Sensitive     LEVOFLOXACIN 1 SENSITIVE Sensitive     VANCOMYCIN 0.5 SENSITIVE Sensitive     *  STREPTOCOCCUS MITIS/ORALIS  Culture, blood (Routine X 2) w Reflex to ID Panel     Status: None (Preliminary result)   Collection Time: 08/11/23  8:44 AM   Specimen: BLOOD RIGHT HAND  Result Value Ref Range Status   Specimen Description   Final    BLOOD RIGHT HAND Performed at [facility], [address], [place], [place] [address]    Special Requests   Final    BOTTLES DRAWN AEROBIC ONLY Blood Culture results may not be optimal due to an inadequate volume of blood received in culture bottles Performed at [facility], [address] [place], [place] [address]    Culture   Final    NO GROWTH 4 DAYS Performed at [facility], [address], [place], [place] [address]    Report Status PENDING  Incomplete  Blood Culture ID Panel (Reflexed)     Status: Abnormal   Collection Time: 08/11/23  8:44 AM  Result Value Ref Range Status   Enterococcus faecalis DETECTED (A) NOT DETECTED Final    Comment: CRITICAL RESULT CALLED TO, READ BACK BY AND VERIFIED WITH: E JACKSON,[email or web address]  08/12/23 MK    Enterococcus Faecium NOT DETECTED NOT DETECTED Final   Listeria monocytogenes NOT DETECTED NOT DETECTED Final   Staphylococcus species NOT DETECTED NOT DETECTED Final   Staphylococcus aureus (BCID) NOT DETECTED NOT DETECTED Final   Staphylococcus epidermidis NOT DETECTED NOT DETECTED Final   Staphylococcus lugdunensis NOT DETECTED NOT DETECTED Final   Streptococcus species NOT DETECTED NOT DETECTED Final   Streptococcus agalactiae NOT DETECTED NOT DETECTED Final    Streptococcus pneumoniae NOT DETECTED NOT DETECTED Final   Streptococcus pyogenes NOT DETECTED NOT DETECTED Final   A.calcoaceticus-baumannii NOT DETECTED NOT DETECTED Final   Bacteroides fragilis NOT DETECTED NOT DETECTED Final   Enterobacterales NOT DETECTED NOT DETECTED Final   Enterobacter cloacae complex NOT DETECTED NOT DETECTED Final   Escherichia coli NOT DETECTED NOT DETECTED Final   Klebsiella aerogenes NOT DETECTED NOT DETECTED Final   Klebsiella oxytoca NOT DETECTED NOT DETECTED Final   Klebsiella pneumoniae NOT DETECTED NOT DETECTED Final   Proteus species NOT DETECTED NOT DETECTED Final   Salmonella species NOT DETECTED NOT DETECTED Final   Serratia marcescens NOT DETECTED NOT DETECTED Final   Haemophilus influenzae NOT DETECTED NOT DETECTED Final   Neisseria meningitidis NOT DETECTED NOT DETECTED Final   Pseudomonas aeruginosa NOT DETECTED NOT DETECTED Final   Stenotrophomonas maltophilia NOT DETECTED NOT DETECTED Final   Candida albicans NOT DETECTED NOT DETECTED Final   Candida auris NOT DETECTED NOT DETECTED Final   Candida glabrata NOT DETECTED NOT DETECTED Final   Candida krusei NOT DETECTED NOT DETECTED Final   Candida parapsilosis NOT DETECTED NOT DETECTED Final   Candida tropicalis NOT DETECTED NOT DETECTED Final   Cryptococcus neoformans/gattii NOT DETECTED NOT DETECTED Final   Vancomycin resistance NOT DETECTED NOT DETECTED Final    Comment: Performed at [facility], [address], [place], [place] [address]  C Difficile Quick Screen w PCR reflex     Status: None   Collection Time: 08/11/23  1:55 PM   Specimen: STOOL  Result Value Ref Range Status   C Diff antigen NEGATIVE NEGATIVE Final   C Diff toxin NEGATIVE NEGATIVE Final   C Diff interpretation No C. difficile detected.  Final    Comment: Performed at [facility], [address], [place], [place] [address]  Culture, blood (Routine X 2) w  Reflex to ID Panel     Status: None  (Preliminary result)   Collection Time: 08/12/23 11:04 AM   Specimen: BLOOD LEFT HAND  Result Value Ref Range Status   Specimen Description   Final    BLOOD LEFT HAND Performed at [facility], [address], [place], [place] [address]    Special Requests   Final    BOTTLES DRAWN AEROBIC AND ANAEROBIC Blood Culture results may not be optimal due to an inadequate volume of blood received in culture bottles Performed at [facility], [address], [place], [place] [address]    Culture   Final    NO GROWTH 3 DAYS Performed at [facility], [address], [place], [place] [address]    Report Status PENDING  Incomplete  Culture, blood (Routine X 2) w Reflex to ID Panel     Status: None (Preliminary result)   Collection Time: 08/12/23 11:12 AM   Specimen: BLOOD RIGHT HAND  Result Value Ref Range Status   Specimen Description   Final    BLOOD RIGHT HAND Performed at [facility], [address] [place], [place] [address]    Special Requests   Final    BOTTLES DRAWN AEROBIC AND ANAEROBIC Blood Culture results may not be optimal due to an inadequate volume of blood received in culture bottles Performed at [facility], [address] [place], [place] [address]    Culture   Final    NO GROWTH 3 DAYS Performed at [facility], [address] [place], [place] [address]    Report Status PENDING  Incomplete  Gastrointestinal Panel by PCR , Stool     Status: Abnormal   Collection Time: 08/12/23  3:14 PM   Specimen: Stool  Result Value Ref Range Status   Campylobacter species NOT DETECTED NOT DETECTED Final   Plesimonas shigelloides NOT DETECTED NOT DETECTED Final   Salmonella species NOT DETECTED NOT DETECTED Final   Yersinia enterocolitica NOT DETECTED NOT DETECTED Final   Vibrio species NOT DETECTED NOT DETECTED Final   Vibrio cholerae NOT DETECTED NOT DETECTED Final   Enteroaggregative E coli (EAEC) NOT DETECTED NOT  DETECTED Final   Enteropathogenic E coli (EPEC) NOT DETECTED NOT DETECTED Final   Enterotoxigenic E coli (ETEC) NOT DETECTED NOT DETECTED Final   Shiga like toxin producing E coli (STEC) NOT DETECTED NOT DETECTED Final   Shigella/Enteroinvasive E coli (EIEC) NOT DETECTED NOT DETECTED Final   Cryptosporidium NOT DETECTED NOT DETECTED Final   Cyclospora cayetanensis NOT DETECTED NOT DETECTED Final   Entamoeba histolytica NOT DETECTED NOT DETECTED Final   Giardia lamblia NOT DETECTED NOT DETECTED Final   Adenovirus F40/41 NOT DETECTED NOT DETECTED Final   Astrovirus DETECTED (A) NOT DETECTED Final   Norovirus GI/GII NOT DETECTED NOT DETECTED Final   Rotavirus A NOT DETECTED NOT DETECTED Final   Sapovirus (I, II, IV, and V) NOT DETECTED NOT DETECTED Final    Comment: Performed at [facility], [address], [place], [place] [address]         Radiology Studies: DG Chest 1 View Result Date: 08/14/2023 CLINICAL DATA:  Shortness of breath EXAM: CHEST  1 VIEW COMPARISON:  08/11/2023 FINDINGS: Mildly degraded exam due to AP portable technique and patient body habitus. Apical lordotic AP portable views x2. Patient rotated right. Normal heart size for level of inspiration. No pleural effusion or pneumothorax. Low lung volumes with resultant pulmonary  interstitial prominence. No congestive failure. No lobar consolidation. Suspect mild subsegmental atelectasis at the left lung base. IMPRESSION: Cardiomegaly with low lung volumes. Resolved congestive heart failure. Mild limitations as detailed above. Probable subsegmental atelectasis the left lung base. Electronically Signed   By: Jeronimo Greaves M.D.   On: 08/14/2023 18:33         Scheduled Meds:  apixaban  5 mg Oral BID   arformoterol  15 mcg Nebulization BID   atorvastatin  20 mg Oral Daily   budesonide (PULMICORT) nebulizer solution  0.25 mg Nebulization BID   busPIRone  7.5 mg Oral BID   Chlorhexidine Gluconate Cloth  6 each  Topical Daily   famotidine  20 mg Oral Daily   furosemide  80 mg Intravenous BID   Gerhardt's butt cream   Topical BID   hydrOXYzine  10 mg Oral TID   insulin aspart  0-15 Units Subcutaneous Q4H   levothyroxine  200 mcg Oral QAC breakfast   metoprolol succinate  12.5 mg Oral Daily   nystatin   Topical BID   mouth rinse  15 mL Mouth Rinse 4 times per day   revefenacin  175 mcg Nebulization Daily   topiramate  50 mg Oral QHS   Continuous Infusions:  ampicillin (OMNIPEN) IV 2 g (08/15/23 1213)     LOS: 6 days    Time spent: 36 min  Alwyn Ren, MD  08/15/2023, 12:57 PM

## 2023-08-16 DIAGNOSIS — B952 Enterococcus as the cause of diseases classified elsewhere: Secondary | ICD-10-CM | POA: Diagnosis not present

## 2023-08-16 DIAGNOSIS — R7881 Bacteremia: Secondary | ICD-10-CM | POA: Diagnosis not present

## 2023-08-16 LAB — BASIC METABOLIC PANEL
Anion gap: 10 (ref 5–15)
BUN: 36 mg/dL — ABNORMAL HIGH (ref 8–23)
CO2: 34 mmol/L — ABNORMAL HIGH (ref 22–32)
Calcium: 8.3 mg/dL — ABNORMAL LOW (ref 8.9–10.3)
Chloride: 95 mmol/L — ABNORMAL LOW (ref 98–111)
Creatinine, Ser: 1.39 mg/dL — ABNORMAL HIGH (ref 0.44–1.00)
GFR, Estimated: 40 mL/min — ABNORMAL LOW (ref >60)
Glucose, Bld: 143 mg/dL — ABNORMAL HIGH (ref 70–99)
Potassium: 3.7 mmol/L (ref 3.5–5.1)
Sodium: 139 mmol/L (ref 135–145)

## 2023-08-16 LAB — GLUCOSE, CAPILLARY
Glucose-Capillary: 150 mg/dL — ABNORMAL HIGH (ref 70–99)
Glucose-Capillary: 152 mg/dL — ABNORMAL HIGH (ref 70–99)
Glucose-Capillary: 158 mg/dL — ABNORMAL HIGH (ref 70–99)
Glucose-Capillary: 207 mg/dL — ABNORMAL HIGH (ref 70–99)
Glucose-Capillary: 87 mg/dL (ref 70–99)
Glucose-Capillary: 99 mg/dL (ref 70–99)

## 2023-08-16 LAB — CULTURE, BLOOD (ROUTINE X 2): Culture: NO GROWTH

## 2023-08-16 NOTE — Progress Notes (Signed)
   08/16/23 2225  BiPAP/CPAP/SIPAP  BiPAP/CPAP/SIPAP Pt Type Adult  BiPAP/CPAP/SIPAP Resmed  Mask Type Full face mask  Mask Size Medium  Respiratory Rate 18 breaths/min  IPAP 16 cmH20  EPAP 8 cmH2O  Flow Rate 3 lpm  Patient Home Equipment No  Auto Titrate No  CPAP/SIPAP surface wiped down Yes  BiPAP/CPAP /SiPAP Vitals  Pulse Rate (!) 57  Resp 18  SpO2 95 %  Bilateral Breath Sounds Diminished;Rhonchi  MEWS Score/Color  MEWS Score 0  MEWS Score Color Glenda Boone

## 2023-08-16 NOTE — Progress Notes (Signed)
 PROGRESS NOTE    Glenda Boone  UJW:[id number] DOB: 1950-06-07 DOA: 08/09/2023 PCP: [facility]  Brief Narrative:73 y.o. female with a history of morbid obesity, bedbound, hypothyroidism, diabetes mellitus type 2, COPD, chronic respiratory failure on 3 L via nasal cannula, heart failure with preserved EF, CKD stage IIIb, depression, hyperlipidemia, paroxysmal atrial fibrillation. Patient presented secondary to shortness of breath and leg swelling with concern for possible acute heart failure. Patient started on IV Lasix. Echocardiogram obtained which is significant for preserved LVEF and grade 2 diastolic dysfunction. During hospitalization, patient was noted to have increasing lethargy requiring significant oxygen with irregular respiratory pattern. ABG obtained and was significant for evidence of acute hypercapnia with associated acidemia. Patient transferred to ICU, started on BiPAP, PCCM consulted.   Assessment & Plan:   Principal Problem:   Enterococcal bacteremia Active Problems:   Acute on chronic congestive heart failure (HCC)   Pressure injury of skin   Diarrhea   Streptococcal bacteremia  Acute on chronic HFpEF BNP of 215.3 with chest imaging suggestive of possible fluid. Transthoracic Echocardiogram ordered and is significant for LVEF of 70% with associated grade 2 diastolic dysfunction.  Right ventricular function is normal. -Lasix 80 mg IV every 8 changed to twice daily 3/21, then changed to 40 mg twice a day on 3/22 Repeat chest x-ray 3/21 resolved CHF Cr 1.39 from 1.51 from 1.49 from 1.28 up trending will monitor closely   Acute on chronic respiratory failure with hypoxia Acute respiratory failure with hypercapnia-resolved with BiPAP.  She continues to refuse using BiPAP she has not used it for the last 3 nights.   ABG 7.1 3/123/69 was placed on BiPAP with improvement in mental status on the 17th. NIV on discharge Encourage her to use BiPAP to avoid  acute hypercapnia   fever-blood culture positive for Enterococcus on ampicillin seen by ID repeat cultures ordered.  Stool C. difficile negative.  COVID RSV and flu negative. Check stool pathogen panel positive for astrovirus!  Supportive measures to be continued.  Her diarrhea has resolved and rectal tube has been taken out on 08/15/2023.  COPD continue Xopenex Hypothyroidism -Continue levothyroxine 200 mcg daily   Primary hypertension -Continue amlodipine   Paroxysmal atrial fibrillation-continue Lopressor, Toprol, Cardizem as needed continue Eliquis  Diabetes mellitus type 2 Well controlled for age with last hemoglobin A1C of 7.8%. Patient is on Farxiga as an outpatient. -Continue carb modified diet -Continue SSI -CBG (last 3)  Recent Labs    08/16/23 0031 08/16/23 0439 08/16/23 0758  GLUCAP 152* 87 99     Acute macrocytic anemia Unclear etiology. -Check anemia panel   CKD stage IIIb Stable.   Depression -Continue Wellbutrin and BuSpar   Hyperlipidemia -Continue Lipitor   Obesity, class III No current weight, however body habitus consistent with at least obesity class III. Pressure Injury 08/10/23 Sacrum Mid Stage 2 -  Partial thickness loss of dermis presenting as a shallow open injury with a red, pink wound bed without slough. (Active)  08/10/23 1449  Location: Sacrum  Location Orientation: Mid  Staging: Stage 2 -  Partial thickness loss of dermis presenting as a shallow open injury with a red, pink wound bed without slough.  Wound Description (Comments):   Present on Admission: Yes  Dressing Type Foam - Lift dressing to assess site every shift 08/14/23 2030    Estimated body mass index is 60.06 kg/m as calculated from the following:   Height as of this encounter: 5\' 4"  (1.626 m).  Weight as of this encounter: 158.7 kg.  DVT prophylaxis:eliquis Code Status: dnr Family Communication: None at bedside  disposition Plan:  Status is: Inpatient Remains  inpatient appropriate because: Acute illness   Consultants:  PCCM  Procedures: None  Antimicrobials: Ampicillin   Subjective: Patient continues to refuse BiPAP every night she says she does not need to use it anymore she can sleep better without it  Objective: Vitals:   08/16/23 0128 08/16/23 0442 08/16/23 0747 08/16/23 0840  BP:  (!) 116/55  (!) 130/59  Pulse:  (!) 57  66  Resp: 18 20    Temp:  98.6 F (37 C)    TempSrc:  Oral    SpO2:  98% 95% 97%  Weight:      Height:        Intake/Output Summary (Last 24 hours) at 08/16/2023 1005 Last data filed at 08/16/2023 1660 Gross per 24 hour  Intake 1785.13 ml  Output 4000 ml  Net -2214.87 ml   Filed Weights   08/10/23 1201 08/12/23 0400  Weight: (!) 163.4 kg (!) 158.7 kg    Examination:  General exam: Appears in no acute distress Respiratory system: Clear to auscultation. Respiratory effort normal. Cardiovascular system: Tachy irregularly irregular  gastrointestinal system: Abdomen is distended, soft and nontender. No organomegaly or masses felt. Normal bowel sounds heard. Central nervous system: Alert and oriented.  Extremities: 1+ edema    Data Reviewed: I have personally reviewed following labs and imaging studies  CBC: Recent Labs  Lab 08/09/23 1123 08/10/23 0429 08/11/23 0019 08/12/23 0257  WBC 11.0* 11.8* 9.6 9.3  NEUTROABS 9.1*  --   --   --   HGB 9.7* 9.8* 9.0* 8.9*  HCT 35.0* 35.6* 32.4* 30.5*  MCV 106.1* 109.5* 106.9* 103.4*  PLT 250 260 227 235   Basic Metabolic Panel: Recent Labs  Lab 08/12/23 0257 08/13/23 0247 08/14/23 0237 08/15/23 0543 08/16/23 0602  NA 139 141 137 139 139  K 3.2* 3.5 3.3* 3.8 3.7  CL 92* 92* 90* 92* 95*  CO2 37* 38* 35* 36* 34*  GLUCOSE 121* 103* 110* 117* 143*  BUN 34* 32* 32* 36* 36*  CREATININE 1.23* 1.28* 1.49* 1.51* 1.39*  CALCIUM 8.0* 7.9* 7.8* 8.2* 8.3*  MG 1.9  --   --   --   --    GFR: Estimated Creatinine Clearance: 54 mL/min (A) (by C-G formula  based on SCr of 1.39 mg/dL (H)). Liver Function Tests: Recent Labs  Lab 08/09/23 1123 08/11/23 0752  AST 12*  --   ALT 14  --   ALKPHOS 100  --   BILITOT 0.5  --   PROT 7.0  --   ALBUMIN 3.3* 2.8*   No results for input(s): "LIPASE", "AMYLASE" in the last 168 hours. No results for input(s): "AMMONIA" in the last 168 hours. Coagulation Profile: No results for input(s): "INR", "PROTIME" in the last 168 hours. Cardiac Enzymes: No results for input(s): "CKTOTAL", "CKMB", "CKMBINDEX", "TROPONINI" in the last 168 hours. BNP (last 3 results) No results for input(s): "PROBNP" in the last 8760 hours. HbA1C: No results for input(s): "HGBA1C" in the last 72 hours.  CBG: Recent Labs  Lab 08/15/23 1611 08/15/23 2007 08/16/23 0031 08/16/23 0439 08/16/23 0758  GLUCAP 203* 148* 152* 87 99   Lipid Profile: No results for input(s): "CHOL", "HDL", "LDLCALC", "TRIG", "CHOLHDL", "LDLDIRECT" in the last 72 hours. Thyroid Function Tests: No results for input(s): "TSH", "T4TOTAL", "FREET4", "T3FREE", "THYROIDAB" in the last 72 hours.  Anemia Panel: No results for input(s): "VITAMINB12", "FOLATE", "FERRITIN", "TIBC", "IRON", "RETICCTPCT" in the last 72 hours.  Sepsis Labs: No results for input(s): "PROCALCITON", "LATICACIDVEN" in the last 168 hours.  Recent Results (from the past 240 hours)  Resp panel by RT-PCR (RSV, Flu A&B, Covid) Anterior Nasal Swab     Status: None   Collection Time: 08/10/23 10:34 AM   Specimen: Anterior Nasal Swab  Result Value Ref Range Status   SARS Coronavirus 2 by RT PCR NEGATIVE NEGATIVE Final    Comment: (NOTE) SARS-CoV-2 target nucleic acids are NOT DETECTED.  The SARS-CoV-2 RNA is generally detectable in upper respiratory specimens during the acute phase of infection. The lowest concentration of SARS-CoV-2 viral copies this assay can detect is 138 copies/mL. A negative result does not preclude SARS-Cov-2 infection and should not be used as the sole  basis for treatment or other patient management decisions. A negative result may occur with  improper specimen collection/handling, submission of specimen other than nasopharyngeal swab, presence of viral mutation(s) within the areas targeted by this assay, and inadequate number of viral copies(<138 copies/mL). A negative result must be combined with clinical observations, patient history, and epidemiological information. The expected result is Negative.  Fact Sheet for Patients:  BloggerCourse.com  Fact Sheet for Healthcare Providers:  SeriousBroker.it  This test is no t yet approved or cleared by the [place] FDA and  has been authorized for detection and/or diagnosis of SARS-CoV-2 by FDA under an Emergency Use Authorization (EUA). This EUA will remain  in effect (meaning this test can be used) for the duration of the COVID-19 declaration under Section 564(b)(1) of the Act, 21 U.S.C.section 360bbb-3(b)(1), unless the authorization is terminated  or revoked sooner.       Influenza A by PCR NEGATIVE NEGATIVE Final   Influenza B by PCR NEGATIVE NEGATIVE Final    Comment: (NOTE) The Xpert Xpress SARS-CoV-2/FLU/RSV plus assay is intended as an aid in the diagnosis of influenza from Nasopharyngeal swab specimens and should not be used as a sole basis for treatment. Nasal washings and aspirates are unacceptable for Xpert Xpress SARS-CoV-2/FLU/RSV testing.  Fact Sheet for Patients: BloggerCourse.com  Fact Sheet for Healthcare Providers: SeriousBroker.it  This test is not yet approved or cleared by the [place] FDA and has been authorized for detection and/or diagnosis of SARS-CoV-2 by FDA under an Emergency Use Authorization (EUA). This EUA will remain in effect (meaning this test can be used) for the duration of the COVID-19 declaration under Section 564(b)(1) of the Act,  21 U.S.C. section 360bbb-3(b)(1), unless the authorization is terminated or revoked.     Resp Syncytial Virus by PCR NEGATIVE NEGATIVE Final    Comment: (NOTE) Fact Sheet for Patients: BloggerCourse.com  Fact Sheet for Healthcare Providers: SeriousBroker.it  This test is not yet approved or cleared by the [place] FDA and has been authorized for detection and/or diagnosis of SARS-CoV-2 by FDA under an Emergency Use Authorization (EUA). This EUA will remain in effect (meaning this test can be used) for the duration of the COVID-19 declaration under Section 564(b)(1) of the Act, 21 U.S.C. section 360bbb-3(b)(1), unless the authorization is terminated or revoked.  Performed at [facility], [address], [place], [place] [address]   Respiratory (~20 pathogens) panel by PCR     Status: None   Collection Time: 08/10/23 10:34 AM   Specimen: Nasopharyngeal Swab; Respiratory  Result Value Ref Range Status   Adenovirus NOT DETECTED NOT DETECTED Final  Coronavirus 229E NOT DETECTED NOT DETECTED Final    Comment: (NOTE) The Coronavirus on the Respiratory Panel, DOES NOT test for the novel  Coronavirus (2019 nCoV)    Coronavirus HKU1 NOT DETECTED NOT DETECTED Final   Coronavirus NL63 NOT DETECTED NOT DETECTED Final   Coronavirus OC43 NOT DETECTED NOT DETECTED Final   Metapneumovirus NOT DETECTED NOT DETECTED Final   Rhinovirus / Enterovirus NOT DETECTED NOT DETECTED Final   Influenza A NOT DETECTED NOT DETECTED Final   Influenza B NOT DETECTED NOT DETECTED Final   Parainfluenza Virus 1 NOT DETECTED NOT DETECTED Final   Parainfluenza Virus 2 NOT DETECTED NOT DETECTED Final   Parainfluenza Virus 3 NOT DETECTED NOT DETECTED Final   Parainfluenza Virus 4 NOT DETECTED NOT DETECTED Final   Respiratory Syncytial Virus NOT DETECTED NOT DETECTED Final   Bordetella pertussis NOT DETECTED NOT DETECTED Final    Bordetella Parapertussis NOT DETECTED NOT DETECTED Final   Chlamydophila pneumoniae NOT DETECTED NOT DETECTED Final   Mycoplasma pneumoniae NOT DETECTED NOT DETECTED Final    Comment: Performed at [facility], [address] [place], [place] [address]  MRSA Next Gen by PCR, Nasal     Status: None   Collection Time: 08/10/23 11:31 AM   Specimen: Nasal Mucosa; Nasal Swab  Result Value Ref Range Status   MRSA by PCR Next Gen NOT DETECTED NOT DETECTED Final    Comment: (NOTE) The GeneXpert MRSA Assay (FDA approved for NASAL specimens only), is one component of a comprehensive MRSA colonization surveillance program. It is not intended to diagnose MRSA infection nor to guide or monitor treatment for MRSA infections. Test performance is not FDA approved in patients less than 49 years old. Performed at [facility], [address], [place], [place] [address]   Culture, blood (Routine X 2) w Reflex to ID Panel     Status: Abnormal   Collection Time: 08/11/23  8:44 AM   Specimen: BLOOD LEFT ARM  Result Value Ref Range Status   Specimen Description   Final    BLOOD LEFT ARM Performed at [facility], [address], [place], [place] [address]    Special Requests   Final    BOTTLES DRAWN AEROBIC AND ANAEROBIC Blood Culture adequate volume Performed at [facility], [address], [place], [place] [address]    Culture  Setup Time   Final    GRAM POSITIVE COCCI IN PAIRS AND CHAINS IN BOTH AEROBIC AND ANAEROBIC BOTTLES Organism ID to follow CRITICAL RESULT CALLED TO, READ BACK BY AND VERIFIED WITH: E JACKSON,[email or web address]  08/12/23 MK Performed at [facility], [address] [place], [place] [address]    Culture (A)  Final    ENTEROCOCCUS FAECALIS STREPTOCOCCUS MITIS/ORALIS    Report Status 08/15/2023 FINAL  Final   Organism ID, Bacteria ENTEROCOCCUS FAECALIS  Final   Organism ID, Bacteria STREPTOCOCCUS MITIS/ORALIS  Final       Susceptibility   Enterococcus faecalis - MIC*    AMPICILLIN <=2 SENSITIVE Sensitive     VANCOMYCIN 1 SENSITIVE Sensitive     GENTAMICIN SYNERGY SENSITIVE Sensitive     * ENTEROCOCCUS FAECALIS   Streptococcus mitis/oralis - MIC*    PENICILLIN <=0.06 SENSITIVE Sensitive     CEFTRIAXONE <=0.12 SENSITIVE Sensitive     LEVOFLOXACIN 1 SENSITIVE Sensitive     VANCOMYCIN 0.5 SENSITIVE Sensitive     * STREPTOCOCCUS MITIS/ORALIS  Culture, blood (Routine X 2) w Reflex to  ID Panel     Status: None   Collection Time: 08/11/23  8:44 AM   Specimen: BLOOD RIGHT HAND  Result Value Ref Range Status   Specimen Description   Final    BLOOD RIGHT HAND Performed at [facility], [address], [place], [place] [address]    Special Requests   Final    BOTTLES DRAWN AEROBIC ONLY Blood Culture results may not be optimal due to an inadequate volume of blood received in culture bottles Performed at [facility], [address] [place], [place] [address]    Culture   Final    NO GROWTH 5 DAYS Performed at [facility], [address] [place], [place] [address]    Report Status 08/16/2023 FINAL  Final  Blood Culture ID Panel (Reflexed)     Status: Abnormal   Collection Time: 08/11/23  8:44 AM  Result Value Ref Range Status   Enterococcus faecalis DETECTED (A) NOT DETECTED Final    Comment: CRITICAL RESULT CALLED TO, READ BACK BY AND VERIFIED WITH: E JACKSON,[email or web address]  08/12/23 MK    Enterococcus Faecium NOT DETECTED NOT DETECTED Final   Listeria monocytogenes NOT DETECTED NOT DETECTED Final   Staphylococcus species NOT DETECTED NOT DETECTED Final   Staphylococcus aureus (BCID) NOT DETECTED NOT DETECTED Final   Staphylococcus epidermidis NOT DETECTED NOT DETECTED Final   Staphylococcus lugdunensis NOT DETECTED NOT DETECTED Final   Streptococcus species NOT DETECTED NOT DETECTED Final   Streptococcus agalactiae NOT DETECTED NOT DETECTED Final   Streptococcus pneumoniae  NOT DETECTED NOT DETECTED Final   Streptococcus pyogenes NOT DETECTED NOT DETECTED Final   A.calcoaceticus-baumannii NOT DETECTED NOT DETECTED Final   Bacteroides fragilis NOT DETECTED NOT DETECTED Final   Enterobacterales NOT DETECTED NOT DETECTED Final   Enterobacter cloacae complex NOT DETECTED NOT DETECTED Final   Escherichia coli NOT DETECTED NOT DETECTED Final   Klebsiella aerogenes NOT DETECTED NOT DETECTED Final   Klebsiella oxytoca NOT DETECTED NOT DETECTED Final   Klebsiella pneumoniae NOT DETECTED NOT DETECTED Final   Proteus species NOT DETECTED NOT DETECTED Final   Salmonella species NOT DETECTED NOT DETECTED Final   Serratia marcescens NOT DETECTED NOT DETECTED Final   Haemophilus influenzae NOT DETECTED NOT DETECTED Final   Neisseria meningitidis NOT DETECTED NOT DETECTED Final   Pseudomonas aeruginosa NOT DETECTED NOT DETECTED Final   Stenotrophomonas maltophilia NOT DETECTED NOT DETECTED Final   Candida albicans NOT DETECTED NOT DETECTED Final   Candida auris NOT DETECTED NOT DETECTED Final   Candida glabrata NOT DETECTED NOT DETECTED Final   Candida krusei NOT DETECTED NOT DETECTED Final   Candida parapsilosis NOT DETECTED NOT DETECTED Final   Candida tropicalis NOT DETECTED NOT DETECTED Final   Cryptococcus neoformans/gattii NOT DETECTED NOT DETECTED Final   Vancomycin resistance NOT DETECTED NOT DETECTED Final    Comment: Performed at [facility], [address], [place], [place] [address]  C Difficile Quick Screen w PCR reflex     Status: None   Collection Time: 08/11/23  1:55 PM   Specimen: STOOL  Result Value Ref Range Status   C Diff antigen NEGATIVE NEGATIVE Final   C Diff toxin NEGATIVE NEGATIVE Final   C Diff interpretation No C. difficile detected.  Final    Comment: Performed at [facility], [address] [place], [place] [address]  Culture, blood (Routine X 2) w Reflex to ID Panel     Status: None (Preliminary result)  Collection Time: 08/12/23 11:04 AM   Specimen: BLOOD LEFT HAND  Result Value Ref Range Status   Specimen Description   Final    BLOOD LEFT HAND Performed at [facility], [address] [place], [place] [address]    Special Requests   Final    BOTTLES DRAWN AEROBIC AND ANAEROBIC Blood Culture results may not be optimal due to an inadequate volume of blood received in culture bottles Performed at [facility], [address], [place], [place] [address]    Culture   Final    NO GROWTH 4 DAYS Performed at [facility], [address] [place], [place] [address]    Report Status PENDING  Incomplete  Culture, blood (Routine X 2) w Reflex to ID Panel     Status: None (Preliminary result)   Collection Time: 08/12/23 11:12 AM   Specimen: BLOOD RIGHT HAND  Result Value Ref Range Status   Specimen Description   Final    BLOOD RIGHT HAND Performed at [facility], [address], [place], [place] [address]    Special Requests   Final    BOTTLES DRAWN AEROBIC AND ANAEROBIC Blood Culture results may not be optimal due to an inadequate volume of blood received in culture bottles Performed at [facility], [address], [place], [place] [address]    Culture   Final    NO GROWTH 4 DAYS Performed at [facility], [address], [place], [place] [address]    Report Status PENDING  Incomplete  Gastrointestinal Panel by PCR , Stool     Status: Abnormal   Collection Time: 08/12/23  3:14 PM   Specimen: Stool  Result Value Ref Range Status   Campylobacter species NOT DETECTED NOT DETECTED Final   Plesimonas shigelloides NOT DETECTED NOT DETECTED Final   Salmonella species NOT DETECTED NOT DETECTED Final   Yersinia enterocolitica NOT DETECTED NOT DETECTED Final   Vibrio species NOT DETECTED NOT DETECTED Final   Vibrio cholerae NOT DETECTED NOT DETECTED Final   Enteroaggregative E coli (EAEC) NOT DETECTED NOT DETECTED Final    Enteropathogenic E coli (EPEC) NOT DETECTED NOT DETECTED Final   Enterotoxigenic E coli (ETEC) NOT DETECTED NOT DETECTED Final   Shiga like toxin producing E coli (STEC) NOT DETECTED NOT DETECTED Final   Shigella/Enteroinvasive E coli (EIEC) NOT DETECTED NOT DETECTED Final   Cryptosporidium NOT DETECTED NOT DETECTED Final   Cyclospora cayetanensis NOT DETECTED NOT DETECTED Final   Entamoeba histolytica NOT DETECTED NOT DETECTED Final   Giardia lamblia NOT DETECTED NOT DETECTED Final   Adenovirus F40/41 NOT DETECTED NOT DETECTED Final   Astrovirus DETECTED (A) NOT DETECTED Final   Norovirus GI/GII NOT DETECTED NOT DETECTED Final   Rotavirus A NOT DETECTED NOT DETECTED Final   Sapovirus (I, II, IV, and V) NOT DETECTED NOT DETECTED Final    Comment: Performed at [facility] [address], [place], [place] [address]         Radiology Studies: DG Chest 1 View Result Date: 08/14/2023 CLINICAL DATA:  Shortness of breath EXAM: CHEST  1 VIEW COMPARISON:  08/11/2023 FINDINGS: Mildly degraded exam due to AP portable technique and patient body habitus. Apical lordotic AP portable views x2. Patient rotated right. Normal heart size for level of inspiration. No pleural effusion or pneumothorax. Low lung volumes with resultant pulmonary interstitial prominence. No congestive failure. No lobar consolidation. Suspect mild subsegmental atelectasis at the left  lung base. IMPRESSION: Cardiomegaly with low lung volumes. Resolved congestive heart failure. Mild limitations as detailed above. Probable subsegmental atelectasis the left lung base. Electronically Signed   By: Jeronimo Greaves M.D.   On: 08/14/2023 18:33         Scheduled Meds:  apixaban  5 mg Oral BID   arformoterol  15 mcg Nebulization BID   atorvastatin  20 mg Oral Daily   budesonide (PULMICORT) nebulizer solution  0.25 mg Nebulization BID   busPIRone  7.5 mg Oral BID   Chlorhexidine Gluconate Cloth  6 each Topical Daily    famotidine  20 mg Oral Daily   furosemide  40 mg Intravenous BID   Gerhardt's butt cream   Topical BID   hydrOXYzine  10 mg Oral TID   insulin aspart  0-15 Units Subcutaneous Q4H   levothyroxine  200 mcg Oral QAC breakfast   metoprolol succinate  12.5 mg Oral Daily   nystatin   Topical BID   mouth rinse  15 mL Mouth Rinse 4 times per day   revefenacin  175 mcg Nebulization Daily   topiramate  50 mg Oral QHS   Continuous Infusions:  ampicillin (OMNIPEN) IV 2 g (08/16/23 0907)     LOS: 7 days    Time spent: 36 min  Alwyn Ren, MD  08/16/2023, 10:05 AM

## 2023-08-17 DIAGNOSIS — R7881 Bacteremia: Secondary | ICD-10-CM | POA: Diagnosis not present

## 2023-08-17 DIAGNOSIS — B952 Enterococcus as the cause of diseases classified elsewhere: Secondary | ICD-10-CM | POA: Diagnosis not present

## 2023-08-17 LAB — BASIC METABOLIC PANEL
Anion gap: 10 (ref 5–15)
BUN: 38 mg/dL — ABNORMAL HIGH (ref 8–23)
CO2: 34 mmol/L — ABNORMAL HIGH (ref 22–32)
Calcium: 8.4 mg/dL — ABNORMAL LOW (ref 8.9–10.3)
Chloride: 95 mmol/L — ABNORMAL LOW (ref 98–111)
Creatinine, Ser: 1.59 mg/dL — ABNORMAL HIGH (ref 0.44–1.00)
GFR, Estimated: 34 mL/min — ABNORMAL LOW (ref >60)
Glucose, Bld: 134 mg/dL — ABNORMAL HIGH (ref 70–99)
Potassium: 3.6 mmol/L (ref 3.5–5.1)
Sodium: 139 mmol/L (ref 135–145)

## 2023-08-17 LAB — CULTURE, BLOOD (ROUTINE X 2)
Culture: NO GROWTH
Culture: NO GROWTH

## 2023-08-17 LAB — GLUCOSE, CAPILLARY
Glucose-Capillary: 180 mg/dL — ABNORMAL HIGH (ref 70–99)
Glucose-Capillary: 120 mg/dL — ABNORMAL HIGH (ref 70–99)
Glucose-Capillary: 142 mg/dL — ABNORMAL HIGH (ref 70–99)
Glucose-Capillary: 123 mg/dL — ABNORMAL HIGH (ref 70–99)
Glucose-Capillary: 124 mg/dL — ABNORMAL HIGH (ref 70–99)

## 2023-08-17 MED ORDER — ALBUTEROL SULFATE (2.5 MG/3ML) 0.083% IN NEBU: 2.5000 mg | INHALATION_SOLUTION | Freq: Four times a day (QID) | RESPIRATORY_TRACT | 12 refills | Status: DC

## 2023-08-17 MED ORDER — POTASSIUM CHLORIDE CRYS ER 20 MEQ PO TBCR: 20.0000 meq | EXTENDED_RELEASE_TABLET | Freq: Every day | ORAL | Status: DC

## 2023-08-17 MED ORDER — AMOXICILLIN 500 MG PO CAPS: 1000.0000 mg | ORAL_CAPSULE | Freq: Three times a day (TID) | ORAL | 0 refills | Status: DC

## 2023-08-17 MED ORDER — INSULIN ASPART 100 UNIT/ML IJ SOLN: 0.0000 [IU] | Freq: Every day | INTRAMUSCULAR | Status: DC

## 2023-08-17 MED ORDER — ARFORMOTEROL TARTRATE 15 MCG/2ML IN NEBU: 15.0000 ug | INHALATION_SOLUTION | Freq: Two times a day (BID) | RESPIRATORY_TRACT | Status: DC

## 2023-08-17 MED ORDER — BUDESONIDE 0.25 MG/2ML IN SUSP: 0.2500 mg | Freq: Two times a day (BID) | RESPIRATORY_TRACT | 12 refills | Status: AC

## 2023-08-17 MED ORDER — SODIUM CHLORIDE 0.9 % IV SOLN
2.0000 g | Freq: Four times a day (QID) | INTRAVENOUS | Status: DC
  Filled 2023-08-17: qty 2000

## 2023-08-17 MED ORDER — AMOXICILLIN 500 MG PO CAPS
1000.0000 mg | ORAL_CAPSULE | Freq: Three times a day (TID) | ORAL | Status: DC
Start: 2023-08-17 — End: 2023-08-17
  Administered 2023-08-17: 1000 mg via ORAL
  Filled 2023-08-17 (×2): qty 2

## 2023-08-17 MED ORDER — ALBUTEROL SULFATE (2.5 MG/3ML) 0.083% IN NEBU: 2.5000 mg | INHALATION_SOLUTION | Freq: Four times a day (QID) | RESPIRATORY_TRACT | Status: DC

## 2023-08-17 MED ORDER — INSULIN ASPART 100 UNIT/ML IJ SOLN
0.0000 [IU] | Freq: Three times a day (TID) | INTRAMUSCULAR | Status: DC
  Administered 2023-08-17 (×2): 2 [IU] via SUBCUTANEOUS

## 2023-08-17 MED ORDER — FUROSEMIDE 40 MG PO TABS: 40.0000 mg | ORAL_TABLET | Freq: Two times a day (BID) | ORAL | Status: DC

## 2023-08-17 MED ORDER — METOPROLOL SUCCINATE ER 25 MG PO TB24: 12.5000 mg | ORAL_TABLET | Freq: Every day | ORAL | Status: DC

## 2023-08-17 NOTE — Progress Notes (Signed)
 Subjective: Moved to the floor Good appetite No focal pain Felt well Rectal tube just removed so not sure if still having diarrhea  No f/c   Antibiotics:  Anti-infectives (From admission, onward)    Start     Dose/Rate Route Frequency Ordered Stop   08/17/23 1400  ampicillin (OMNIPEN) 2 g in sodium chloride 0.9 % 100 mL IVPB  Status:  Discontinued        2 g 300 mL/hr over 20 Minutes Intravenous Every 6 hours 08/17/23 0813 08/17/23 1209   08/17/23 1400  amoxicillin (AMOXIL) capsule 1,000 mg        1,000 mg Oral Every 8 hours 08/17/23 1209 08/26/23 0559   08/12/23 0600  ampicillin (OMNIPEN) 2 g in sodium chloride 0.9 % 100 mL IVPB  Status:  Discontinued        2 g 300 mL/hr over 20 Minutes Intravenous Every 4 hours 08/12/23 0446 08/17/23 0813       Medications: Scheduled Meds:  amoxicillin  1,000 mg Oral Q8H   apixaban  5 mg Oral BID   arformoterol  15 mcg Nebulization BID   atorvastatin  20 mg Oral Daily   budesonide (PULMICORT) nebulizer solution  0.25 mg Nebulization BID   busPIRone  7.5 mg Oral BID   Chlorhexidine Gluconate Cloth  6 each Topical Daily   famotidine  20 mg Oral Daily   furosemide  40 mg Intravenous BID   Gerhardt's butt cream   Topical BID   hydrOXYzine  10 mg Oral TID   insulin aspart  0-15 Units Subcutaneous TID WC   insulin aspart  0-5 Units Subcutaneous QHS   levothyroxine  200 mcg Oral QAC breakfast   metoprolol succinate  12.5 mg Oral Daily   nystatin   Topical BID   mouth rinse  15 mL Mouth Rinse 4 times per day   revefenacin  175 mcg Nebulization Daily   topiramate  50 mg Oral QHS   Continuous Infusions:   PRN Meds:.levalbuterol, metoprolol tartrate, [DISCONTINUED] ondansetron **OR** ondansetron (ZOFRAN) IV, mouth rinse, oxyCODONE, polyvinyl alcohol, sodium chloride flush    Objective: Weight change:   Intake/Output Summary (Last 24 hours) at 08/17/2023 1312 Last data filed at 08/17/2023 1000 Gross per 24 hour  Intake  220 ml  Output 2650 ml  Net -2430 ml   Blood pressure 131/79, pulse 63, temperature 97.8 F (36.6 C), temperature source Oral, resp. rate 18, height 5\' 4"  (1.626 m), weight (!) 158.7 kg, SpO2 100%. Temp:  [97.8 F (36.6 C)-98.8 F (37.1 C)] 97.8 F (36.6 C) (03/24 1230) Pulse Rate:  [57-64] 63 (03/24 1230) Resp:  [16-20] 18 (03/24 1230) BP: (127-132)/(57-79) 131/79 (03/24 1230) SpO2:  [89 %-100 %] 100 % (03/24 1230)  Physical Exam: Physical Exam Constitutional:      General: She is not in acute distress.    Appearance: She is well-developed. She is not diaphoretic.  HENT:     Head: Normocephalic and atraumatic.     Right Ear: External ear normal.     Left Ear: External ear normal.     Mouth/Throat:     Pharynx: No oropharyngeal exudate.  Eyes:     General: No scleral icterus.    Conjunctiva/sclera: Conjunctivae normal.     Pupils: Pupils are equal, round, and reactive to light.  Cardiovascular:     Rate and Rhythm: Normal rate and regular rhythm.     Heart sounds: Normal heart sounds. No murmur heard.  No friction rub. No gallop.  Pulmonary:     Effort: Pulmonary effort is normal. No respiratory distress.     Breath sounds: Normal breath sounds. No stridor. No wheezing, rhonchi or rales.  Abdominal:     General: Bowel sounds are normal. There is no distension.     Palpations: Abdomen is soft.  Musculoskeletal:        General: No tenderness. Normal range of motion.  Lymphadenopathy:     Cervical: No cervical adenopathy.  Skin:    General: Skin is warm and dry.     Coloration: Skin is not pale.     Findings: No erythema or rash.  Neurological:     General: No focal deficit present.     Mental Status: She is alert and oriented to person, place, and time.     Motor: No abnormal muscle tone.     Coordination: Coordination normal.  Psychiatric:        Mood and Affect: Mood normal.        Behavior: Behavior normal.        Thought Content: Thought content normal.         Judgment: Judgment normal.      LABs: Lab Results  Component Value Date   WBC 9.3 08/12/2023   HGB 8.9 (L) 08/12/2023   HCT 30.5 (L) 08/12/2023   MCV 103.4 (H) 08/12/2023   PLT 235 08/12/2023    Last metabolic panel Lab Results  Component Value Date   GLUCOSE 134 (H) 08/17/2023   NA 139 08/17/2023   K 3.6 08/17/2023   CL 95 (L) 08/17/2023   CO2 34 (H) 08/17/2023   BUN 38 (H) 08/17/2023   CREATININE 1.59 (H) 08/17/2023   GFRNONAA 34 (L) 08/17/2023   CALCIUM 8.4 (L) 08/17/2023   PROT 7.0 08/09/2023   ALBUMIN 2.8 (L) 08/11/2023   BILITOT 0.5 08/09/2023   ALKPHOS 100 08/09/2023   AST 12 (L) 08/09/2023   ALT 14 08/09/2023   ANIONGAP 10 08/17/2023   Micro Results: Recent Results (from the past 720 hours)  Resp panel by RT-PCR (RSV, Flu A&B, Covid) Anterior Nasal Swab     Status: None   Collection Time: 08/10/23 10:34 AM   Specimen: Anterior Nasal Swab  Result Value Ref Range Status   SARS Coronavirus 2 by RT PCR NEGATIVE NEGATIVE Final    Comment: (NOTE) SARS-CoV-2 target nucleic acids are NOT DETECTED.  The SARS-CoV-2 RNA is generally detectable in upper respiratory specimens during the acute phase of infection. The lowest concentration of SARS-CoV-2 viral copies this assay can detect is 138 copies/mL. A negative result does not preclude SARS-Cov-2 infection and should not be used as the sole basis for treatment or other patient management decisions. A negative result may occur with  improper specimen collection/handling, submission of specimen other than nasopharyngeal swab, presence of viral mutation(s) within the areas targeted by this assay, and inadequate number of viral copies(<138 copies/mL). A negative result must be combined with clinical observations, patient history, and epidemiological information. The expected result is Negative.  Fact Sheet for Patients:  BloggerCourse.com  Fact Sheet for Healthcare Providers:   SeriousBroker.it  This test is no t yet approved or cleared by the [place] FDA and  has been authorized for detection and/or diagnosis of SARS-CoV-2 by FDA under an Emergency Use Authorization (EUA). This EUA will remain  in effect (meaning this test can be used) for the duration of the COVID-19 declaration under Section 564(b)(1) of the Act,  21 U.S.C.section 360bbb-3(b)(1), unless the authorization is terminated  or revoked sooner.       Influenza A by PCR NEGATIVE NEGATIVE Final   Influenza B by PCR NEGATIVE NEGATIVE Final    Comment: (NOTE) The Xpert Xpress SARS-CoV-2/FLU/RSV plus assay is intended as an aid in the diagnosis of influenza from Nasopharyngeal swab specimens and should not be used as a sole basis for treatment. Nasal washings and aspirates are unacceptable for Xpert Xpress SARS-CoV-2/FLU/RSV testing.  Fact Sheet for Patients: BloggerCourse.com  Fact Sheet for Healthcare Providers: SeriousBroker.it  This test is not yet approved or cleared by the [place] FDA and has been authorized for detection and/or diagnosis of SARS-CoV-2 by FDA under an Emergency Use Authorization (EUA). This EUA will remain in effect (meaning this test can be used) for the duration of the COVID-19 declaration under Section 564(b)(1) of the Act, 21 U.S.C. section 360bbb-3(b)(1), unless the authorization is terminated or revoked.     Resp Syncytial Virus by PCR NEGATIVE NEGATIVE Final    Comment: (NOTE) Fact Sheet for Patients: BloggerCourse.com  Fact Sheet for Healthcare Providers: SeriousBroker.it  This test is not yet approved or cleared by the [place] FDA and has been authorized for detection and/or diagnosis of SARS-CoV-2 by FDA under an Emergency Use Authorization (EUA). This EUA will remain in effect (meaning this test can be used) for  the duration of the COVID-19 declaration under Section 564(b)(1) of the Act, 21 U.S.C. section 360bbb-3(b)(1), unless the authorization is terminated or revoked.  Performed at [facility], [address] [place], [place] [address]   Respiratory (~20 pathogens) panel by PCR     Status: None   Collection Time: 08/10/23 10:34 AM   Specimen: Nasopharyngeal Swab; Respiratory  Result Value Ref Range Status   Adenovirus NOT DETECTED NOT DETECTED Final   Coronavirus 229E NOT DETECTED NOT DETECTED Final    Comment: (NOTE) The Coronavirus on the Respiratory Panel, DOES NOT test for the novel  Coronavirus (2019 nCoV)    Coronavirus HKU1 NOT DETECTED NOT DETECTED Final   Coronavirus NL63 NOT DETECTED NOT DETECTED Final   Coronavirus OC43 NOT DETECTED NOT DETECTED Final   Metapneumovirus NOT DETECTED NOT DETECTED Final   Rhinovirus / Enterovirus NOT DETECTED NOT DETECTED Final   Influenza A NOT DETECTED NOT DETECTED Final   Influenza B NOT DETECTED NOT DETECTED Final   Parainfluenza Virus 1 NOT DETECTED NOT DETECTED Final   Parainfluenza Virus 2 NOT DETECTED NOT DETECTED Final   Parainfluenza Virus 3 NOT DETECTED NOT DETECTED Final   Parainfluenza Virus 4 NOT DETECTED NOT DETECTED Final   Respiratory Syncytial Virus NOT DETECTED NOT DETECTED Final   Bordetella pertussis NOT DETECTED NOT DETECTED Final   Bordetella Parapertussis NOT DETECTED NOT DETECTED Final   Chlamydophila pneumoniae NOT DETECTED NOT DETECTED Final   Mycoplasma pneumoniae NOT DETECTED NOT DETECTED Final    Comment: Performed at [facility], [address] [place], [place] [address]  MRSA Next Gen by PCR, Nasal     Status: None   Collection Time: 08/10/23 11:31 AM   Specimen: Nasal Mucosa; Nasal Swab  Result Value Ref Range Status   MRSA by PCR Next Gen NOT DETECTED NOT DETECTED Final    Comment: (NOTE) The GeneXpert MRSA Assay (FDA approved for NASAL specimens only), is one component of a  comprehensive MRSA colonization surveillance program. It is not intended to diagnose MRSA infection nor to guide or monitor treatment for MRSA infections.  Test performance is not FDA approved in patients less than 19 years old. Performed at [facility], [address] [place], [place] [address]   Culture, blood (Routine X 2) w Reflex to ID Panel     Status: Abnormal   Collection Time: 08/11/23  8:44 AM   Specimen: BLOOD LEFT ARM  Result Value Ref Range Status   Specimen Description   Final    BLOOD LEFT ARM Performed at [facility], [address], [place], [place] [address]    Special Requests   Final    BOTTLES DRAWN AEROBIC AND ANAEROBIC Blood Culture adequate volume Performed at [facility], [address] [place], [place] [address]    Culture  Setup Time   Final    GRAM POSITIVE COCCI IN PAIRS AND CHAINS IN BOTH AEROBIC AND ANAEROBIC BOTTLES Organism ID to follow CRITICAL RESULT CALLED TO, READ BACK BY AND VERIFIED WITH: E JACKSON,[email or web address]  08/12/23 MK Performed at [facility], [address] [place], [place] [address]    Culture (A)  Final    ENTEROCOCCUS FAECALIS STREPTOCOCCUS MITIS/ORALIS    Report Status 08/15/2023 FINAL  Final   Organism ID, Bacteria ENTEROCOCCUS FAECALIS  Final   Organism ID, Bacteria STREPTOCOCCUS MITIS/ORALIS  Final      Susceptibility   Enterococcus faecalis - MIC*    AMPICILLIN <=2 SENSITIVE Sensitive     VANCOMYCIN 1 SENSITIVE Sensitive     GENTAMICIN SYNERGY SENSITIVE Sensitive     * ENTEROCOCCUS FAECALIS   Streptococcus mitis/oralis - MIC*    PENICILLIN <=0.06 SENSITIVE Sensitive     CEFTRIAXONE <=0.12 SENSITIVE Sensitive     LEVOFLOXACIN 1 SENSITIVE Sensitive     VANCOMYCIN 0.5 SENSITIVE Sensitive     * STREPTOCOCCUS MITIS/ORALIS  Culture, blood (Routine X 2) w Reflex to ID Panel     Status: None   Collection Time: 08/11/23  8:44 AM   Specimen: BLOOD RIGHT HAND  Result Value  Ref Range Status   Specimen Description   Final    BLOOD RIGHT HAND Performed at [facility], [address] [place], [place] [address]    Special Requests   Final    BOTTLES DRAWN AEROBIC ONLY Blood Culture results may not be optimal due to an inadequate volume of blood received in culture bottles Performed at [facility], [address], [place], [place] [address]    Culture   Final    NO GROWTH 5 DAYS Performed at [facility], [address], [place], [place] [address]    Report Status 08/16/2023 FINAL  Final  Blood Culture ID Panel (Reflexed)     Status: Abnormal   Collection Time: 08/11/23  8:44 AM  Result Value Ref Range Status   Enterococcus faecalis DETECTED (A) NOT DETECTED Final    Comment: CRITICAL RESULT CALLED TO, READ BACK BY AND VERIFIED WITH: E JACKSON,[email or web address]  08/12/23 MK    Enterococcus Faecium NOT DETECTED NOT DETECTED Final   Listeria monocytogenes NOT DETECTED NOT DETECTED Final   Staphylococcus species NOT DETECTED NOT DETECTED Final   Staphylococcus aureus (BCID) NOT DETECTED NOT DETECTED Final   Staphylococcus epidermidis NOT DETECTED NOT DETECTED Final   Staphylococcus lugdunensis NOT DETECTED NOT DETECTED Final   Streptococcus species NOT DETECTED NOT DETECTED Final   Streptococcus agalactiae NOT DETECTED NOT DETECTED Final   Streptococcus pneumoniae NOT DETECTED NOT DETECTED Final   Streptococcus pyogenes NOT DETECTED NOT DETECTED Final  A.calcoaceticus-baumannii NOT DETECTED NOT DETECTED Final   Bacteroides fragilis NOT DETECTED NOT DETECTED Final   Enterobacterales NOT DETECTED NOT DETECTED Final   Enterobacter cloacae complex NOT DETECTED NOT DETECTED Final   Escherichia coli NOT DETECTED NOT DETECTED Final   Klebsiella aerogenes NOT DETECTED NOT DETECTED Final   Klebsiella oxytoca NOT DETECTED NOT DETECTED Final   Klebsiella pneumoniae NOT DETECTED NOT DETECTED Final   Proteus species NOT DETECTED NOT  DETECTED Final   Salmonella species NOT DETECTED NOT DETECTED Final   Serratia marcescens NOT DETECTED NOT DETECTED Final   Haemophilus influenzae NOT DETECTED NOT DETECTED Final   Neisseria meningitidis NOT DETECTED NOT DETECTED Final   Pseudomonas aeruginosa NOT DETECTED NOT DETECTED Final   Stenotrophomonas maltophilia NOT DETECTED NOT DETECTED Final   Candida albicans NOT DETECTED NOT DETECTED Final   Candida auris NOT DETECTED NOT DETECTED Final   Candida glabrata NOT DETECTED NOT DETECTED Final   Candida krusei NOT DETECTED NOT DETECTED Final   Candida parapsilosis NOT DETECTED NOT DETECTED Final   Candida tropicalis NOT DETECTED NOT DETECTED Final   Cryptococcus neoformans/gattii NOT DETECTED NOT DETECTED Final   Vancomycin resistance NOT DETECTED NOT DETECTED Final    Comment: Performed at [facility], [address], [place], [place] [address]  C Difficile Quick Screen w PCR reflex     Status: None   Collection Time: 08/11/23  1:55 PM   Specimen: STOOL  Result Value Ref Range Status   C Diff antigen NEGATIVE NEGATIVE Final   C Diff toxin NEGATIVE NEGATIVE Final   C Diff interpretation No C. difficile detected.  Final    Comment: Performed at [facility], [address], [place], [place] [address]  Culture, blood (Routine X 2) w Reflex to ID Panel     Status: None   Collection Time: 08/12/23 11:04 AM   Specimen: BLOOD LEFT HAND  Result Value Ref Range Status   Specimen Description   Final    BLOOD LEFT HAND Performed at [facility], [address] [place], [place] [address]    Special Requests   Final    BOTTLES DRAWN AEROBIC AND ANAEROBIC Blood Culture results may not be optimal due to an inadequate volume of blood received in culture bottles Performed at [facility], [address] [place], [place] [address]    Culture   Final    NO GROWTH 5 DAYS Performed at [facility], [address] [place],  [place] [address]    Report Status 08/17/2023 FINAL  Final  Culture, blood (Routine X 2) w Reflex to ID Panel     Status: None   Collection Time: 08/12/23 11:12 AM   Specimen: BLOOD RIGHT HAND  Result Value Ref Range Status   Specimen Description   Final    BLOOD RIGHT HAND Performed at [facility], [address] [place], [place] [address]    Special Requests   Final    BOTTLES DRAWN AEROBIC AND ANAEROBIC Blood Culture results may not be optimal due to an inadequate volume of blood received in culture bottles Performed at [facility], [address], [place], [place] [address]    Culture   Final    NO GROWTH 5 DAYS Performed at [facility], [address], [place], [place] [address]    Report Status 08/17/2023 FINAL  Final  Gastrointestinal Panel by PCR , Stool  Status: Abnormal   Collection Time: 08/12/23  3:14 PM   Specimen: Stool  Result Value Ref Range Status   Campylobacter species NOT DETECTED NOT DETECTED Final   Plesimonas shigelloides NOT DETECTED NOT DETECTED Final   Salmonella species NOT DETECTED NOT DETECTED Final   Yersinia enterocolitica NOT DETECTED NOT DETECTED Final   Vibrio species NOT DETECTED NOT DETECTED Final   Vibrio cholerae NOT DETECTED NOT DETECTED Final   Enteroaggregative E coli (EAEC) NOT DETECTED NOT DETECTED Final   Enteropathogenic E coli (EPEC) NOT DETECTED NOT DETECTED Final   Enterotoxigenic E coli (ETEC) NOT DETECTED NOT DETECTED Final   Shiga like toxin producing E coli (STEC) NOT DETECTED NOT DETECTED Final   Shigella/Enteroinvasive E coli (EIEC) NOT DETECTED NOT DETECTED Final   Cryptosporidium NOT DETECTED NOT DETECTED Final   Cyclospora cayetanensis NOT DETECTED NOT DETECTED Final   Entamoeba histolytica NOT DETECTED NOT DETECTED Final   Giardia lamblia NOT DETECTED NOT DETECTED Final   Adenovirus F40/41 NOT DETECTED NOT DETECTED Final   Astrovirus DETECTED (A) NOT DETECTED Final   Norovirus GI/GII NOT  DETECTED NOT DETECTED Final   Rotavirus A NOT DETECTED NOT DETECTED Final   Sapovirus (I, II, IV, and V) NOT DETECTED NOT DETECTED Final    Comment: Performed at [facility], [address] [place], [place] [address]    Studies/Results: No results found.    Assessment/Plan:   Principal Problem:   Enterococcal bacteremia Active Problems:   Acute on chronic congestive heart failure (HCC)   Pressure injury of skin   Diarrhea   Streptococcal bacteremia    MERIBETH VITUG is a 73 y.o. female with history of morbid obesity and generally bedbound status admitted with shortness of breath.  She was improving from standpoint of her heart failure exacerbation but then began having diarrhea found to have astrovirus on gi pcr, complicated by e faecalis bacteremia  Tte unrevealing, but in setting obesity  Working diagnosis is that she had gut translocation of the E faecalis into the blood in the context of her diarrheal illness.  Initial bcx 3/18 with 1 of 2 set strep mitis/oralis and e faecalis 3/19 bcx repeat ngtd  Agree tee can be deferred    3/24 clinically doing much better  -would finish 14 days of abx starting 3/19 for the e faecalis bacteremia; can be done with amoxicillin; transitioned today  -id will sign off; discharge when cleared by primary team -continue contact precaution for diarrhea per IP protocol -discussed with dr Jerolyn Center     LOS: 8 days   Daielle Melcher T Duel Conrad 08/17/2023, 1:12 PM

## 2023-08-17 NOTE — Progress Notes (Signed)
 PHARMACY NOTE:  ANTIMICROBIAL RENAL DOSAGE ADJUSTMENT  Current antimicrobial regimen includes a mismatch between antimicrobial dosage and estimated renal function.  As per policy approved by the Pharmacy & Therapeutics and Medical Executive Committees, the antimicrobial dosage will be adjusted accordingly.  Current antimicrobial dosage:  ampicillin 2 gm IV q4h  Indication: bacteremia  Renal Function:  Estimated Creatinine Clearance: 47.2 mL/min (A) (by C-G formula based on SCr of 1.59 mg/dL (H)).  Antimicrobial dosage has been changed to:  ampicillin 2 gm IV q6h  Additional comments: consider change to PO amoxicillin   Thank you for allowing pharmacy to be a part of this patient's care.  Herby Abraham, Pharm.D Use secure chat for questions 08/17/2023 8:15 AM

## 2023-08-17 NOTE — Progress Notes (Signed)
 Physical Therapy Treatment Patient Details Name: Glenda Boone MRN: [id number] DOB: 07/21/1950 Today's Date: 08/17/2023   History of Present Illness Glenda Boone is a 73 year old female presenting 08/09/23  with shortness of breath and increased leg swelling, volume overload,hypercarrbic, hypoxia. PMH: morbid obese, HTN, anxiety and depression, COPD, ankle fracture,    PT Comments  Pt seen for PT tx with pt agreeable. Pt requesting to eat lunch but agreeable to attempt sitting EOB. Pt does well at directing her care;  pt requires min assist for supine>sit with HOB elevated & bed rails to come to sitting on R side of bed. Pt reports prior to admission she had PRN assistance for bed mobility but was able to perform stand pivot transfers bed<>w/c, w/c<>toilet without assistance & is hopeful to return to that level of function. Pt set up with lunch tray & left sitting on EOB with call bell in reach. Continue to recommend acute PT services to progress mobility as able.    If plan is discharge home, recommend the following: A lot of help with bathing/dressing/bathroom;A lot of help with walking and/or transfers   Can travel by private vehicle     No  Equipment Recommendations  None recommended by PT    Recommendations for Other Services       Precautions / Restrictions Precautions Precautions: Fall Restrictions Weight Bearing Restrictions Per Provider Order: No     Mobility  Bed Mobility Overal bed mobility: Needs Assistance Bed Mobility: Supine to Sit     Supine to sit: Min assist, HOB elevated, Used rails (exit R side of bed, slightly extra time)          Transfers                        Ambulation/Gait                   Stairs             Wheelchair Mobility     Tilt Bed    Modified Rankin (Stroke Patients Only)       Balance Overall balance assessment: Needs assistance Sitting-balance support: Bilateral upper extremity supported,  Feet supported Sitting balance-Leahy Scale: Good Sitting balance - Comments: supervision static sitting                                    Communication Communication Communication: No apparent difficulties  Cognition Arousal: Alert Behavior During Therapy: WFL for tasks assessed/performed   PT - Cognitive impairments: No apparent impairments                         Following commands: Intact      Cueing    Exercises      General Comments        Pertinent Vitals/Pain Pain Assessment Pain Assessment: No/denies pain    Home Living                          Prior Function            PT Goals (current goals can now be found in the care plan section) Acute Rehab PT Goals Patient Stated Goal: to be able to get up by myself PT Goal Formulation: With patient Time For Goal Achievement: 08/27/23 Potential to Achieve Goals: Good Progress towards  PT goals: Progressing toward goals    Frequency    Min 2X/week      PT Plan      Co-evaluation              AM-PAC PT "6 Clicks" Mobility   Outcome Measure  Help needed turning from your back to your side while in a flat bed without using bedrails?: A Lot Help needed moving from lying on your back to sitting on the side of a flat bed without using bedrails?: Total Help needed moving to and from a bed to a chair (including a wheelchair)?: A Lot Help needed standing up from a chair using your arms (e.g., wheelchair or bedside chair)?: A Lot Help needed to walk in hospital room?: Total Help needed climbing 3-5 steps with a railing? : Total 6 Click Score: 9    End of Session Equipment Utilized During Treatment: Oxygen Activity Tolerance: Patient tolerated treatment well Patient left: in bed;with call bell/phone within reach;with bed alarm set   PT Visit Diagnosis: Muscle weakness (generalized) (M62.81);Other abnormalities of gait and mobility (R26.89)     Time: 1610-9604 PT  Time Calculation (min) (ACUTE ONLY): 11 min  Charges:    $Therapeutic Activity: 8-22 mins PT General Charges $$ ACUTE PT VISIT: 1 Visit                     Aleda Grana, PT, DPT 08/17/23, 12:10 PM    Sandi Mariscal 08/17/2023, 12:08 PM

## 2023-08-17 NOTE — TOC Transition Note (Addendum)
 Transition of Care [facility]) - Discharge Note   Patient Details  Name: LEEBA BARBE MRN: [id number] Date of Birth: 14-Apr-1951  Transition of Care [facility]) CM/SW Contact:  Lanier Clam, RN Phone Number: 08/17/2023, 1:31 PM   Clinical Narrative: Per [facility] d/c summary please note patient refusing CPAP;f/u with pulmonology. going to rm#[id number],report #[phone]. Will call [facility] once d/c summary available. -4p called again to confirm [facility] for p/u for return [place]. No further CM needs.      Final next level of care: Skilled Nursing Facility Barriers to Discharge: No Barriers Identified   Patient Goals and CMS Choice Patient states their goals for this hospitalization and ongoing recovery are:: Rehab CMS Medicare.gov Compare Post Acute Care list provided to:: Patient Represenative (must comment) (Jennifer(sister)) Choice offered to / list presented to : Sibling [place] ownership interest in [facility].provided to:: Sibling    Discharge Placement              Patient chooses bed at: [facility] Patient to be transferred to facility by: [facility] Name of family member notified: Victorino Dike Patient and family notified of of transfer: 08/17/23  Discharge Plan and Services Additional resources added to the After Visit Summary for                                       Social Drivers of Health (SDOH) Interventions SDOH Screenings   Food Insecurity: Patient Declined (08/09/2023)  Housing: Patient Declined (08/09/2023)  Transportation Needs: Patient Declined (08/09/2023)  Utilities: Patient Declined (08/09/2023)  Social Connections: Patient Unable To Answer (08/09/2023)  Tobacco Use: High Risk (08/09/2023)     Readmission Risk Interventions    08/14/2023   12:38 PM 08/12/2023    2:14 PM  Readmission Risk Prevention Plan  Transportation Screening Complete Complete  PCP or Specialist Appt within 5-7 Days Complete Complete  Home Care Screening Complete  Complete  Medication Review (RN CM) Complete Complete

## 2023-08-17 NOTE — Progress Notes (Signed)
 Report called to [facility] facility.

## 2023-08-17 NOTE — Discharge Summary (Signed)
 Physician Discharge Summary  Glenda Boone:[id number] DOB: 1950-08-20 DOA: 08/09/2023  PCP: [facility]  Admit date: 08/09/2023 Discharge date: 08/17/2023  Admitted From: Nursing home Disposition: Nursing home Recommendations for Outpatient Follow-up:  Follow up with PCP in 1-2 weeks Please obtain BMP/CBC in one week Please follow up with pulmonology for obstructive sleep apnea workup and for  BiPAP/NIV  Home Health: None Equipment/Devices none Discharge Condition: Stable CODE STATUS: DNR  Diet recommendation: carb modified Brief/Interim Summary: 73 y.o. female with a history of morbid obesity, bedbound, hypothyroidism, diabetes mellitus type 2, COPD, chronic respiratory failure on 3 L via nasal cannula, heart failure with preserved EF, CKD stage IIIb, depression, hyperlipidemia, paroxysmal atrial fibrillation. Patient presented secondary to shortness of breath and leg swelling with concern for possible acute heart failure. Patient started on IV Lasix. Echocardiogram obtained which is significant for preserved LVEF and grade 2 diastolic dysfunction. During hospitalization, patient was noted to have increasing lethargy requiring significant oxygen with irregular respiratory pattern. ABG obtained and was significant for evidence of acute hypercapnia with associated acidemia. Patient transferred to ICU, started on BiPAP, PCCM consulted.  Patient had Foley catheter during the hospital stay this was taken out on August 17, 2023 and she was able to urinate after the Foley was taken out.   Discharge Diagnoses:  Principal Problem:   Enterococcal bacteremia Active Problems:   Acute on chronic congestive heart failure (HCC)   Pressure injury of skin   Diarrhea   Streptococcal bacteremia   Pressure Injury 08/10/23 Sacrum Mid Stage 2 -  Partial thickness loss of dermis presenting as a shallow open injury with a red, pink wound bed without slough. (Active)  08/10/23 1449   Location: Sacrum  Location Orientation: Mid  Staging: Stage 2 -  Partial thickness loss of dermis presenting as a shallow open injury with a red, pink wound bed without slough.  Wound Description (Comments):   Present on Admission: Yes  Dressing Type None 08/17/23 0807    Acute on chronic HFpEF BNP of 215.3 with chest imaging suggestive of possible fluid. Transthoracic Echocardiogram ordered and is significant for LVEF of 70% with associated grade 2 diastolic dysfunction.  Right ventricular function is normal.  She was treated with high-dose Lasix IV during the hospital stay and discharged on 40 mg twice a day.  Repeat chest x-ray showed resolved CHF.  Renal functions remained stable.  Please follow-up BMP in 1 week to make sure renal functions and potassium are stable.   Acute on chronic respiratory failure with hypoxia Acute respiratory failure with hypercapnia-resolved with BiPAP.  She has refused BiPAP for the last 3 nights.  Looking through her old records she has been refusing CPAP since 2023.  She will need outpatient pulmonary follow-up for workup for obstructive sleep apnea and NIV.     Enterococcal bacteremia.  Patient was seen by infectious disease.  She will be discharged on ampicillin for 14 days total.  Fever-blood culture positive for Enterococcus on amoxicillin seen by ID repeat cultures ordered.   Amoxicillin1000 mg Q8 till August 26, 2023.   Stool C. difficile negative.  COVID RSV and flu negative. Check stool pathogen panel positive for astrovirus!  Diarrhea has resolved.  Rectal tube was taken out on 08/15/2023.    COPD continue albuterol nebulizer  Hypothyroidism -Continue levothyroxine 200 mcg daily   Primary hypertension Norvasc was stopped on discharge as it causes edema to bilateral lower extremity Continue metoprolol and Lasix   Paroxysmal atrial  fibrillation-continue Eliquis and Toprol   Diabetes mellitus type 2 Well controlled for age with last hemoglobin A1C  of 7.8%.  Continue Farxiga.      Acute macrocytic anemia stable   CKD stage IIIb Stable.   Depression -Continue Wellbutrin and BuSpar   Hyperlipidemia -Continue Lipitor   Obesity, class III No current weight, however body habitus consistent with at least obesity class III.     Pressure Injury 08/10/23 Sacrum Mid Stage 2 -  Partial thickness loss of dermis presenting as a shallow open injury with a red, pink wound bed without slough. (Active)  08/10/23 1449  Location: Sacrum  Location Orientation: Mid  Staging: Stage 2 -  Partial thickness loss of dermis presenting as a shallow open injury with a red, pink wound bed without slough.  Wound Description (Comments):   Present on Admission: Yes  Dressing Type Foam - Lift dressing to assess site every shift 08/14/23 2030    Estimated body mass index is 60.06 kg/m as calculated from the following:   Height as of this encounter: 5\' 4"  (1.626 m).   Weight as of this encounter: 158.7 kg.  Discharge Instructions  Discharge Instructions     Diet - low sodium heart healthy   Complete by: As directed    Discharge wound care:   Complete by: As directed    See orders   Increase activity slowly   Complete by: As directed       Allergies as of 08/17/2023       Reactions   Codeine Nausea Only, Other (See Comments)   "Allergic," per verbal MAR   Shellfish-derived Products Hives, Other (See Comments)   "Allergic," per verbal MAR   Gabapentin Other (See Comments)   Gives nightmares- "Allergic," per verbal MAR   Meperidine Hcl Anxiety, Other (See Comments)   "Allergic," per verbal MAR        Medication List     STOP taking these medications    acetaminophen 325 MG tablet Commonly known as: TYLENOL   amLODipine 2.5 MG tablet Commonly known as: NORVASC   ibuprofen 200 MG tablet Commonly known as: ADVIL   tiZANidine 2 MG tablet Commonly known as: ZANAFLEX   topiramate 50 MG tablet Commonly known as: TOPAMAX        TAKE these medications    albuterol 108 (90 Base) MCG/ACT inhaler Commonly known as: VENTOLIN HFA Inhale 2 puffs into the lungs every 6 (six) hours as needed for wheezing or shortness of breath.   amoxicillin 500 MG capsule Commonly known as: AMOXIL Take 2 capsules (1,000 mg total) by mouth 3 (three) times daily.   apixaban 5 MG Tabs tablet Commonly known as: ELIQUIS Take 1 tablet (5 mg total) by mouth 2 (two) times daily.   arformoterol 15 MCG/2ML Nebu Commonly known as: BROVANA Take 2 mLs (15 mcg total) by nebulization 2 (two) times daily.   atorvastatin 20 MG tablet Commonly known as: LIPITOR Take 1 tablet (20 mg total) by mouth daily.   barrier cream Crea Commonly known as: non-specified Apply 1 Application topically 2 (two) times daily. To labia and groin area for irritation  and with all incontinent care.   budesonide 0.25 MG/2ML nebulizer solution Commonly known as: PULMICORT Take 2 mLs (0.25 mg total) by nebulization 2 (two) times daily.   buPROPion ER 100 MG 12 hr tablet Commonly known as: WELLBUTRIN SR Take 100 mg by mouth daily.   busPIRone 7.5 MG tablet Commonly known as: BUSPAR Take 7.5  mg by mouth 2 (two) times daily. What changed: Another medication with the same name was removed. Continue taking this medication, and follow the directions you see here.   dapagliflozin propanediol 10 MG Tabs tablet Commonly known as: FARXIGA Take 1 tablet (10 mg total) by mouth daily.   furosemide 40 MG tablet Commonly known as: LASIX Take 1 tablet (40 mg total) by mouth daily.   levothyroxine 200 MCG tablet Commonly known as: SYNTHROID Take 200 mcg by mouth daily before breakfast.   lidocaine 4 % Place 1 patch onto the skin daily.   metoprolol succinate 25 MG 24 hr tablet Commonly known as: TOPROL-XL Take 0.5 tablets (12.5 mg total) by mouth daily. Start taking on: August 18, 2023 What changed: additional instructions   MiraLax 17 GM/SCOOP powder Generic  drug: polyethylene glycol powder Take 17 g by mouth daily as needed for moderate constipation.   OXYGEN Inhale 2 L/min into the lungs continuous. Every shift   tetrahydrozoline 0.05 % ophthalmic solution Place 2 drops into both eyes.               Discharge Care Instructions  (From admission, onward)           Start     Ordered   08/17/23 0000  Discharge wound care:       Comments: See orders   08/17/23 1335            Contact information for after-discharge care     Destination     [facility] .   Service: Skilled Nursing Contact information: [address] [place] [address] [phone]                    Allergies  Allergen Reactions   Codeine Nausea Only and Other (See Comments)    "Allergic," per verbal MAR   Shellfish-Derived Products Hives and Other (See Comments)    "Allergic," per verbal MAR   Gabapentin Other (See Comments)    Gives nightmares- "Allergic," per verbal MAR   Meperidine Hcl Anxiety and Other (See Comments)    "Allergic," per verbal MAR    Consultations: pccm  Procedures/Studies: DG Chest 1 View Result Date: 08/14/2023 CLINICAL DATA:  Shortness of breath EXAM: CHEST  1 VIEW COMPARISON:  08/11/2023 FINDINGS: Mildly degraded exam due to AP portable technique and patient body habitus. Apical lordotic AP portable views x2. Patient rotated right. Normal heart size for level of inspiration. No pleural effusion or pneumothorax. Low lung volumes with resultant pulmonary interstitial prominence. No congestive failure. No lobar consolidation. Suspect mild subsegmental atelectasis at the left lung base. IMPRESSION: Cardiomegaly with low lung volumes. Resolved congestive heart failure. Mild limitations as detailed above. Probable subsegmental atelectasis the left lung base. Electronically Signed   By: Jeronimo Greaves M.D.   On: 08/14/2023 18:33   DG CHEST PORT 1 VIEW Result Date: 08/11/2023 CLINICAL DATA:   Hypoxia and shortness of breath. EXAM: PORTABLE CHEST 1 VIEW COMPARISON:  Chest x-ray dated August 09, 2023. FINDINGS: Stable cardiomegaly. Low lung volumes. Worsening diffuse interstitial thickening. Possible small bilateral pleural effusions. No pneumothorax. No acute osseous abnormality. IMPRESSION: 1. Worsening interstitial pulmonary edema. Electronically Signed   By: Obie Dredge M.D.   On: 08/11/2023 12:48   ECHOCARDIOGRAM COMPLETE Result Date: 08/10/2023    ECHOCARDIOGRAM REPORT   Patient Name:   Glenda Boone Date of Exam: 08/10/2023 Medical Rec #:  [id number]       Height:  64.0 in Accession #:    [id number]      Weight:       341.0 lb Date of Birth:  September 13, 1950       BSA:          2.454 m Patient Age:    73 years        BP:           141/56 mmHg Patient Gender: F               HR:           51 bpm. Exam Location:  Inpatient Procedure: 2D Echo, Cardiac Doppler and Color Doppler (Both Spectral and Color            Flow Doppler were utilized during procedure). Indications:    CHF- Acute Diastolic  History:        Patient has prior history of Echocardiogram examinations, most                 recent 05/28/2021. Risk Factors:Hypertension, Diabetes and Current                 Smoker.  Sonographer:    Karma Ganja Referring Phys: [id number] MIR M [facility]  Sonographer Comments: Patient is obese and Technically challenging study due to limited acoustic windows. IMPRESSIONS  1. Left ventricular ejection fraction, by estimation, is 70 to 75%. The left ventricle has hyperdynamic function. The left ventricle has no regional wall motion abnormalities. There is moderate concentric left ventricular hypertrophy. Left ventricular diastolic parameters are consistent with Grade II diastolic dysfunction (pseudonormalization).  2. Right ventricular systolic function is normal. The right ventricular size is normal.  3. Left atrial size was moderately dilated.  4. The mitral valve is normal in structure. Trivial mitral  valve regurgitation. No evidence of mitral stenosis.  5. The aortic valve is normal in structure. Aortic valve regurgitation is not visualized. No aortic stenosis is present.  6. The inferior vena cava is dilated in size with <50% respiratory variability, suggesting right atrial pressure of 15 mmHg. FINDINGS  Left Ventricle: Left ventricular ejection fraction, by estimation, is 70 to 75%. The left ventricle has hyperdynamic function. The left ventricle has no regional wall motion abnormalities. The left ventricular internal cavity size was normal in size. There is moderate concentric left ventricular hypertrophy. Left ventricular diastolic parameters are consistent with Grade II diastolic dysfunction (pseudonormalization). Right Ventricle: The right ventricular size is normal. No increase in right ventricular wall thickness. Right ventricular systolic function is normal. Left Atrium: Left atrial size was moderately dilated. Right Atrium: Right atrial size was normal in size. Pericardium: There is no evidence of pericardial effusion. Mitral Valve: The mitral valve is normal in structure. Mild mitral annular calcification. Trivial mitral valve regurgitation. No evidence of mitral valve stenosis. MV peak gradient, 11.8 mmHg. The mean mitral valve gradient is 3.0 mmHg. Tricuspid Valve: The tricuspid valve is normal in structure. Tricuspid valve regurgitation is trivial. No evidence of tricuspid stenosis. Aortic Valve: The aortic valve is normal in structure. Aortic valve regurgitation is not visualized. No aortic stenosis is present. Aortic valve mean gradient measures 7.0 mmHg. Aortic valve peak gradient measures 15.5 mmHg. Aortic valve area, by VTI measures 2.78 cm. Pulmonic Valve: The pulmonic valve was normal in structure. Pulmonic valve regurgitation is not visualized. No evidence of pulmonic stenosis. Aorta: The aortic root is normal in size and structure. Venous: The inferior vena cava is dilated in size with  less than  50% respiratory variability, suggesting right atrial pressure of 15 mmHg. IAS/Shunts: No atrial level shunt detected by color flow Doppler.  LEFT VENTRICLE PLAX 2D LVIDd:         4.70 cm   Diastology LVIDs:         2.35 cm   LV e' medial:    4.57 cm/s LV PW:         1.70 cm   LV E/e' medial:  37.9 LV IVS:        1.90 cm   LV e' lateral:   8.16 cm/s LVOT diam:     2.10 cm   LV E/e' lateral: 21.2 LV SV:         110 LV SV Index:   45 LVOT Area:     3.46 cm  RIGHT VENTRICLE             IVC RV Basal diam:  4.10 cm     IVC diam: 2.20 cm RV S prime:     15.90 cm/s TAPSE (M-mode): 3.6 cm LEFT ATRIUM              Index        RIGHT ATRIUM           Index LA diam:        4.30 cm  1.75 cm/m   RA Area:     19.10 cm LA Vol (A2C):   180.0 ml 73.33 ml/m  RA Volume:   52.90 ml  21.55 ml/m LA Vol (A4C):   95.3 ml  38.83 ml/m LA Biplane Vol: 130.0 ml 52.96 ml/m  AORTIC VALVE AV Area (Vmax):    2.30 cm AV Area (Vmean):   2.57 cm AV Area (VTI):     2.78 cm AV Vmax:           197.00 cm/s AV Vmean:          113.000 cm/s AV VTI:            0.395 m AV Peak Grad:      15.5 mmHg AV Mean Grad:      7.0 mmHg LVOT Vmax:         131.00 cm/s LVOT Vmean:        83.700 cm/s LVOT VTI:          0.317 m LVOT/AV VTI ratio: 0.80  AORTA Ao Root diam: 3.40 cm MITRAL VALVE MV Area (PHT): 3.63 cm     SHUNTS MV Area VTI:   1.99 cm     Systemic VTI:  0.32 m MV Peak grad:  11.8 mmHg    Systemic Diam: 2.10 cm MV Mean grad:  3.0 mmHg MV Vmax:       1.72 m/s MV Vmean:      78.6 cm/s MV Decel Time: 209 msec MV E velocity: 173.00 cm/s MV A velocity: 136.00 cm/s MV E/A ratio:  1.27 Arvilla Meres MD Electronically signed by Arvilla Meres MD Signature Date/Time: 08/10/2023/9:33:08 AM    Final    DG Chest Portable 1 View Result Date: 08/09/2023 CLINICAL DATA:  Dyspnea.  Shortness of breath and wheezing. EXAM: PORTABLE CHEST 1 VIEW COMPARISON:  05/29/2022 FINDINGS: Stable cardiac enlargement. Chronic asymmetric elevation of right  hemidiaphragm. Mild diffuse interstitial prominence. No pleural effusion or consolidative change. IMPRESSION: 1. Cardiomegaly and mild diffuse interstitial prominence. 2. Chronic asymmetric elevation of right hemidiaphragm. Electronically Signed   By: Signa Kell M.D.   On: 08/09/2023 11:55   (Echo, Carotid, EGD,  Colonoscopy, ERCP)    Subjective:  Eating bagel no new c/o Discharge Exam: Vitals:   08/17/23 0843 08/17/23 1230  BP:  131/79  Pulse:  63  Resp:  18  Temp:  97.8 F (36.6 C)  SpO2: 91% 100%   Vitals:   08/17/23 0500 08/17/23 0836 08/17/23 0843 08/17/23 1230  BP:    131/79  Pulse:    63  Resp: 17   18  Temp:    97.8 F (36.6 C)  TempSrc:    Oral  SpO2: 93% 91% 91% 100%  Weight:      Height:        General: Pt is alert, awake, not in acute distress Cardiovascular: RRR, S1/S2 +, no rubs, no gallops Respiratory: clear bilaterally, no wheezing, no rhonchi Abdominal: Soft, NT, ND, bowel sounds + Extremities: edema   The results of significant diagnostics from this hospitalization (including imaging, microbiology, ancillary and laboratory) are listed below for reference.     Microbiology: Recent Results (from the past 240 hours)  Resp panel by RT-PCR (RSV, Flu A&B, Covid) Anterior Nasal Swab     Status: None   Collection Time: 08/10/23 10:34 AM   Specimen: Anterior Nasal Swab  Result Value Ref Range Status   SARS Coronavirus 2 by RT PCR NEGATIVE NEGATIVE Final    Comment: (NOTE) SARS-CoV-2 target nucleic acids are NOT DETECTED.  The SARS-CoV-2 RNA is generally detectable in upper respiratory specimens during the acute phase of infection. The lowest concentration of SARS-CoV-2 viral copies this assay can detect is 138 copies/mL. A negative result does not preclude SARS-Cov-2 infection and should not be used as the sole basis for treatment or other patient management decisions. A negative result may occur with  improper specimen collection/handling,  submission of specimen other than nasopharyngeal swab, presence of viral mutation(s) within the areas targeted by this assay, and inadequate number of viral copies(<138 copies/mL). A negative result must be combined with clinical observations, patient history, and epidemiological information. The expected result is Negative.  Fact Sheet for Patients:  BloggerCourse.com  Fact Sheet for Healthcare Providers:  SeriousBroker.it  This test is no t yet approved or cleared by the [place] FDA and  has been authorized for detection and/or diagnosis of SARS-CoV-2 by FDA under an Emergency Use Authorization (EUA). This EUA will remain  in effect (meaning this test can be used) for the duration of the COVID-19 declaration under Section 564(b)(1) of the Act, 21 U.S.C.section 360bbb-3(b)(1), unless the authorization is terminated  or revoked sooner.       Influenza A by PCR NEGATIVE NEGATIVE Final   Influenza B by PCR NEGATIVE NEGATIVE Final    Comment: (NOTE) The Xpert Xpress SARS-CoV-2/FLU/RSV plus assay is intended as an aid in the diagnosis of influenza from Nasopharyngeal swab specimens and should not be used as a sole basis for treatment. Nasal washings and aspirates are unacceptable for Xpert Xpress SARS-CoV-2/FLU/RSV testing.  Fact Sheet for Patients: BloggerCourse.com  Fact Sheet for Healthcare Providers: SeriousBroker.it  This test is not yet approved or cleared by the [place] FDA and has been authorized for detection and/or diagnosis of SARS-CoV-2 by FDA under an Emergency Use Authorization (EUA). This EUA will remain in effect (meaning this test can be used) for the duration of the COVID-19 declaration under Section 564(b)(1) of the Act, 21 U.S.C. section 360bbb-3(b)(1), unless the authorization is terminated or revoked.     Resp Syncytial Virus by PCR NEGATIVE  NEGATIVE Final  Comment: (NOTE) Fact Sheet for Patients: BloggerCourse.com  Fact Sheet for Healthcare Providers: SeriousBroker.it  This test is not yet approved or cleared by the [place] FDA and has been authorized for detection and/or diagnosis of SARS-CoV-2 by FDA under an Emergency Use Authorization (EUA). This EUA will remain in effect (meaning this test can be used) for the duration of the COVID-19 declaration under Section 564(b)(1) of the Act, 21 U.S.C. section 360bbb-3(b)(1), unless the authorization is terminated or revoked.  Performed at [facility], [address] [place], [place] [address]   Respiratory (~20 pathogens) panel by PCR     Status: None   Collection Time: 08/10/23 10:34 AM   Specimen: Nasopharyngeal Swab; Respiratory  Result Value Ref Range Status   Adenovirus NOT DETECTED NOT DETECTED Final   Coronavirus 229E NOT DETECTED NOT DETECTED Final    Comment: (NOTE) The Coronavirus on the Respiratory Panel, DOES NOT test for the novel  Coronavirus (2019 nCoV)    Coronavirus HKU1 NOT DETECTED NOT DETECTED Final   Coronavirus NL63 NOT DETECTED NOT DETECTED Final   Coronavirus OC43 NOT DETECTED NOT DETECTED Final   Metapneumovirus NOT DETECTED NOT DETECTED Final   Rhinovirus / Enterovirus NOT DETECTED NOT DETECTED Final   Influenza A NOT DETECTED NOT DETECTED Final   Influenza B NOT DETECTED NOT DETECTED Final   Parainfluenza Virus 1 NOT DETECTED NOT DETECTED Final   Parainfluenza Virus 2 NOT DETECTED NOT DETECTED Final   Parainfluenza Virus 3 NOT DETECTED NOT DETECTED Final   Parainfluenza Virus 4 NOT DETECTED NOT DETECTED Final   Respiratory Syncytial Virus NOT DETECTED NOT DETECTED Final   Bordetella pertussis NOT DETECTED NOT DETECTED Final   Bordetella Parapertussis NOT DETECTED NOT DETECTED Final   Chlamydophila pneumoniae NOT DETECTED NOT DETECTED Final   Mycoplasma  pneumoniae NOT DETECTED NOT DETECTED Final    Comment: Performed at [facility], [address], [place], [place] [address]  MRSA Next Gen by PCR, Nasal     Status: None   Collection Time: 08/10/23 11:31 AM   Specimen: Nasal Mucosa; Nasal Swab  Result Value Ref Range Status   MRSA by PCR Next Gen NOT DETECTED NOT DETECTED Final    Comment: (NOTE) The GeneXpert MRSA Assay (FDA approved for NASAL specimens only), is one component of a comprehensive MRSA colonization surveillance program. It is not intended to diagnose MRSA infection nor to guide or monitor treatment for MRSA infections. Test performance is not FDA approved in patients less than 14 years old. Performed at [facility], [address] [place], [place] [address]   Culture, blood (Routine X 2) w Reflex to ID Panel     Status: Abnormal   Collection Time: 08/11/23  8:44 AM   Specimen: BLOOD LEFT ARM  Result Value Ref Range Status   Specimen Description   Final    BLOOD LEFT ARM Performed at [facility], [address], [place], [place] [address]    Special Requests   Final    BOTTLES DRAWN AEROBIC AND ANAEROBIC Blood Culture adequate volume Performed at [facility], [address], [place], [place] [address]    Culture  Setup Time   Final    GRAM POSITIVE COCCI IN PAIRS AND CHAINS IN BOTH AEROBIC AND ANAEROBIC BOTTLES Organism ID to follow CRITICAL RESULT CALLED TO, READ BACK BY AND VERIFIED WITH: E [email or web address]  08/12/23 MK Performed at [facility], [address] [place],  [place] [address]    Culture (A)  Final    ENTEROCOCCUS FAECALIS STREPTOCOCCUS MITIS/ORALIS    Report Status 08/15/2023 FINAL  Final   Organism ID, Bacteria ENTEROCOCCUS FAECALIS  Final   Organism ID, Bacteria STREPTOCOCCUS MITIS/ORALIS  Final      Susceptibility   Enterococcus faecalis - MIC*    AMPICILLIN <=2 SENSITIVE Sensitive     VANCOMYCIN 1 SENSITIVE Sensitive      GENTAMICIN SYNERGY SENSITIVE Sensitive     * ENTEROCOCCUS FAECALIS   Streptococcus mitis/oralis - MIC*    PENICILLIN <=0.06 SENSITIVE Sensitive     CEFTRIAXONE <=0.12 SENSITIVE Sensitive     LEVOFLOXACIN 1 SENSITIVE Sensitive     VANCOMYCIN 0.5 SENSITIVE Sensitive     * STREPTOCOCCUS MITIS/ORALIS  Culture, blood (Routine X 2) w Reflex to ID Panel     Status: None   Collection Time: 08/11/23  8:44 AM   Specimen: BLOOD RIGHT HAND  Result Value Ref Range Status   Specimen Description   Final    BLOOD RIGHT HAND Performed at [facility], [address] [place], [place] [address]    Special Requests   Final    BOTTLES DRAWN AEROBIC ONLY Blood Culture results may not be optimal due to an inadequate volume of blood received in culture bottles Performed at [facility], [address] [place], [place] [address]    Culture   Final    NO GROWTH 5 DAYS Performed at [facility], [address], [place], [place] [address]    Report Status 08/16/2023 FINAL  Final  Blood Culture ID Panel (Reflexed)     Status: Abnormal   Collection Time: 08/11/23  8:44 AM  Result Value Ref Range Status   Enterococcus faecalis DETECTED (A) NOT DETECTED Final    Comment: CRITICAL RESULT CALLED TO, READ BACK BY AND VERIFIED WITH: E JACKSON,[email or web address]@[email or web address]  08/12/23 MK    Enterococcus Faecium NOT DETECTED NOT DETECTED Final   Listeria monocytogenes NOT DETECTED NOT DETECTED Final   Staphylococcus species NOT DETECTED NOT DETECTED Final   Staphylococcus aureus (BCID) NOT DETECTED NOT DETECTED Final   Staphylococcus epidermidis NOT DETECTED NOT DETECTED Final   Staphylococcus lugdunensis NOT DETECTED NOT DETECTED Final   Streptococcus species NOT DETECTED NOT DETECTED Final   Streptococcus agalactiae NOT DETECTED NOT DETECTED Final   Streptococcus pneumoniae NOT DETECTED NOT DETECTED Final   Streptococcus pyogenes NOT DETECTED NOT DETECTED Final   A.calcoaceticus-baumannii NOT  DETECTED NOT DETECTED Final   Bacteroides fragilis NOT DETECTED NOT DETECTED Final   Enterobacterales NOT DETECTED NOT DETECTED Final   Enterobacter cloacae complex NOT DETECTED NOT DETECTED Final   Escherichia coli NOT DETECTED NOT DETECTED Final   Klebsiella aerogenes NOT DETECTED NOT DETECTED Final   Klebsiella oxytoca NOT DETECTED NOT DETECTED Final   Klebsiella pneumoniae NOT DETECTED NOT DETECTED Final   Proteus species NOT DETECTED NOT DETECTED Final   Salmonella species NOT DETECTED NOT DETECTED Final   Serratia marcescens NOT DETECTED NOT DETECTED Final   Haemophilus influenzae NOT DETECTED NOT DETECTED Final   Neisseria meningitidis NOT DETECTED NOT DETECTED Final   Pseudomonas aeruginosa NOT DETECTED NOT DETECTED Final   Stenotrophomonas maltophilia NOT DETECTED NOT DETECTED Final   Candida albicans NOT DETECTED NOT DETECTED Final   Candida auris NOT DETECTED NOT DETECTED Final   Candida glabrata NOT DETECTED NOT DETECTED Final   Candida krusei NOT DETECTED NOT DETECTED Final   Candida parapsilosis NOT DETECTED NOT DETECTED Final  Candida tropicalis NOT DETECTED NOT DETECTED Final   Cryptococcus neoformans/gattii NOT DETECTED NOT DETECTED Final   Vancomycin resistance NOT DETECTED NOT DETECTED Final    Comment: Performed at [facility], [address] [place], [place] [address]  C Difficile Quick Screen w PCR reflex     Status: None   Collection Time: 08/11/23  1:55 PM   Specimen: STOOL  Result Value Ref Range Status   C Diff antigen NEGATIVE NEGATIVE Final   C Diff toxin NEGATIVE NEGATIVE Final   C Diff interpretation No C. difficile detected.  Final    Comment: Performed at [facility], [address], [place], [place] [address]  Culture, blood (Routine X 2) w Reflex to ID Panel     Status: None   Collection Time: 08/12/23 11:04 AM   Specimen: BLOOD LEFT HAND  Result Value Ref Range Status   Specimen Description   Final    BLOOD LEFT  HAND Performed at [facility], [address] [place], [place] [address]    Special Requests   Final    BOTTLES DRAWN AEROBIC AND ANAEROBIC Blood Culture results may not be optimal due to an inadequate volume of blood received in culture bottles Performed at [facility], [address], [place], [place] [address]    Culture   Final    NO GROWTH 5 DAYS Performed at [facility], [address], [place], [place] [address]    Report Status 08/17/2023 FINAL  Final  Culture, blood (Routine X 2) w Reflex to ID Panel     Status: None   Collection Time: 08/12/23 11:12 AM   Specimen: BLOOD RIGHT HAND  Result Value Ref Range Status   Specimen Description   Final    BLOOD RIGHT HAND Performed at [facility], [address], [place], [place] [address]    Special Requests   Final    BOTTLES DRAWN AEROBIC AND ANAEROBIC Blood Culture results may not be optimal due to an inadequate volume of blood received in culture bottles Performed at [facility], [address] [place], [place] [address]    Culture   Final    NO GROWTH 5 DAYS Performed at [facility], [address], [place], [place] [address]    Report Status 08/17/2023 FINAL  Final  Gastrointestinal Panel by PCR , Stool     Status: Abnormal   Collection Time: 08/12/23  3:14 PM   Specimen: Stool  Result Value Ref Range Status   Campylobacter species NOT DETECTED NOT DETECTED Final   Plesimonas shigelloides NOT DETECTED NOT DETECTED Final   Salmonella species NOT DETECTED NOT DETECTED Final   Yersinia enterocolitica NOT DETECTED NOT DETECTED Final   Vibrio species NOT DETECTED NOT DETECTED Final   Vibrio cholerae NOT DETECTED NOT DETECTED Final   Enteroaggregative E coli (EAEC) NOT DETECTED NOT DETECTED Final   Enteropathogenic E coli (EPEC) NOT DETECTED NOT DETECTED Final   Enterotoxigenic E coli (ETEC) NOT DETECTED NOT DETECTED Final   Shiga like toxin producing E  coli (STEC) NOT DETECTED NOT DETECTED Final   Shigella/Enteroinvasive E coli (EIEC) NOT DETECTED NOT DETECTED Final   Cryptosporidium NOT DETECTED NOT DETECTED Final   Cyclospora cayetanensis NOT DETECTED NOT DETECTED Final   Entamoeba histolytica NOT DETECTED NOT DETECTED Final   Giardia lamblia NOT DETECTED NOT DETECTED Final   Adenovirus F40/41 NOT DETECTED NOT DETECTED Final   Astrovirus  DETECTED (A) NOT DETECTED Final   Norovirus GI/GII NOT DETECTED NOT DETECTED Final   Rotavirus A NOT DETECTED NOT DETECTED Final   Sapovirus (I, II, IV, and V) NOT DETECTED NOT DETECTED Final    Comment: Performed at [facility], [address] [place], [place] [address]     Labs: BNP (last 3 results) Recent Labs    08/09/23 1123  BNP 215.3*   Basic Metabolic Panel: Recent Labs  Lab 08/12/23 0257 08/13/23 0247 08/14/23 0237 08/15/23 0543 08/16/23 0602 08/17/23 0454  NA 139 141 137 139 139 139  K 3.2* 3.5 3.3* 3.8 3.7 3.6  CL 92* 92* 90* 92* 95* 95*  CO2 37* 38* 35* 36* 34* 34*  GLUCOSE 121* 103* 110* 117* 143* 134*  BUN 34* 32* 32* 36* 36* 38*  CREATININE 1.23* 1.28* 1.49* 1.51* 1.39* 1.59*  CALCIUM 8.0* 7.9* 7.8* 8.2* 8.3* 8.4*  MG 1.9  --   --   --   --   --    Liver Function Tests: Recent Labs  Lab 08/11/23 0752  ALBUMIN 2.8*   No results for input(s): "LIPASE", "AMYLASE" in the last 168 hours. No results for input(s): "AMMONIA" in the last 168 hours. CBC: Recent Labs  Lab 08/11/23 0019 08/12/23 0257  WBC 9.6 9.3  HGB 9.0* 8.9*  HCT 32.4* 30.5*  MCV 106.9* 103.4*  PLT 227 235   Cardiac Enzymes: No results for input(s): "CKTOTAL", "CKMB", "CKMBINDEX", "TROPONINI" in the last 168 hours. BNP: Invalid input(s): "POCBNP" CBG: Recent Labs  Lab 08/16/23 1959 08/16/23 2336 08/17/23 0423 08/17/23 0818 08/17/23 1226  GLUCAP 180* 158* 120* 142* 123*   D-Dimer No results for input(s): "DDIMER" in the last 72 hours. Hgb A1c No results for input(s):  "HGBA1C" in the last 72 hours. Lipid Profile No results for input(s): "CHOL", "HDL", "LDLCALC", "TRIG", "CHOLHDL", "LDLDIRECT" in the last 72 hours. Thyroid function studies No results for input(s): "TSH", "T4TOTAL", "T3FREE", "THYROIDAB" in the last 72 hours.  Invalid input(s): "FREET3" Anemia work up No results for input(s): "VITAMINB12", "FOLATE", "FERRITIN", "TIBC", "IRON", "RETICCTPCT" in the last 72 hours. Urinalysis    Component Value Date/Time   COLORURINE YELLOW 03/29/2022 0850   APPEARANCEUR HAZY (A) 03/29/2022 0850   LABSPEC 1.017 03/29/2022 0850   PHURINE 5.0 03/29/2022 0850   GLUCOSEU NEGATIVE 03/29/2022 0850   HGBUR NEGATIVE 03/29/2022 0850   BILIRUBINUR NEGATIVE 03/29/2022 0850   KETONESUR NEGATIVE 03/29/2022 0850   PROTEINUR NEGATIVE 03/29/2022 0850   NITRITE POSITIVE (A) 03/29/2022 0850   LEUKOCYTESUR SMALL (A) 03/29/2022 0850   Sepsis Labs Recent Labs  Lab 08/11/23 0019 08/12/23 0257  WBC 9.6 9.3   Microbiology Recent Results (from the past 240 hours)  Resp panel by RT-PCR (RSV, Flu A&B, Covid) Anterior Nasal Swab     Status: None   Collection Time: 08/10/23 10:34 AM   Specimen: Anterior Nasal Swab  Result Value Ref Range Status   SARS Coronavirus 2 by RT PCR NEGATIVE NEGATIVE Final    Comment: (NOTE) SARS-CoV-2 target nucleic acids are NOT DETECTED.  The SARS-CoV-2 RNA is generally detectable in upper respiratory specimens during the acute phase of infection. The lowest concentration of SARS-CoV-2 viral copies this assay can detect is 138 copies/mL. A negative result does not preclude SARS-Cov-2 infection and should not be used as the sole basis for treatment or other patient management decisions. A negative result may occur with  improper specimen collection/handling, submission of specimen other than nasopharyngeal swab, presence of  viral mutation(s) within the areas targeted by this assay, and inadequate number of viral copies(<138 copies/mL). A  negative result must be combined with clinical observations, patient history, and epidemiological information. The expected result is Negative.  Fact Sheet for Patients:  BloggerCourse.com  Fact Sheet for Healthcare Providers:  SeriousBroker.it  This test is no t yet approved or cleared by the [place] FDA and  has been authorized for detection and/or diagnosis of SARS-CoV-2 by FDA under an Emergency Use Authorization (EUA). This EUA will remain  in effect (meaning this test can be used) for the duration of the COVID-19 declaration under Section 564(b)(1) of the Act, 21 U.S.C.section 360bbb-3(b)(1), unless the authorization is terminated  or revoked sooner.       Influenza A by PCR NEGATIVE NEGATIVE Final   Influenza B by PCR NEGATIVE NEGATIVE Final    Comment: (NOTE) The Xpert Xpress SARS-CoV-2/FLU/RSV plus assay is intended as an aid in the diagnosis of influenza from Nasopharyngeal swab specimens and should not be used as a sole basis for treatment. Nasal washings and aspirates are unacceptable for Xpert Xpress SARS-CoV-2/FLU/RSV testing.  Fact Sheet for Patients: BloggerCourse.com  Fact Sheet for Healthcare Providers: SeriousBroker.it  This test is not yet approved or cleared by the [place] FDA and has been authorized for detection and/or diagnosis of SARS-CoV-2 by FDA under an Emergency Use Authorization (EUA). This EUA will remain in effect (meaning this test can be used) for the duration of the COVID-19 declaration under Section 564(b)(1) of the Act, 21 U.S.C. section 360bbb-3(b)(1), unless the authorization is terminated or revoked.     Resp Syncytial Virus by PCR NEGATIVE NEGATIVE Final    Comment: (NOTE) Fact Sheet for Patients: BloggerCourse.com  Fact Sheet for Healthcare  Providers: SeriousBroker.it  This test is not yet approved or cleared by the [place] FDA and has been authorized for detection and/or diagnosis of SARS-CoV-2 by FDA under an Emergency Use Authorization (EUA). This EUA will remain in effect (meaning this test can be used) for the duration of the COVID-19 declaration under Section 564(b)(1) of the Act, 21 U.S.C. section 360bbb-3(b)(1), unless the authorization is terminated or revoked.  Performed at [facility], [address], [place], [place] [address]   Respiratory (~20 pathogens) panel by PCR     Status: None   Collection Time: 08/10/23 10:34 AM   Specimen: Nasopharyngeal Swab; Respiratory  Result Value Ref Range Status   Adenovirus NOT DETECTED NOT DETECTED Final   Coronavirus 229E NOT DETECTED NOT DETECTED Final    Comment: (NOTE) The Coronavirus on the Respiratory Panel, DOES NOT test for the novel  Coronavirus (2019 nCoV)    Coronavirus HKU1 NOT DETECTED NOT DETECTED Final   Coronavirus NL63 NOT DETECTED NOT DETECTED Final   Coronavirus OC43 NOT DETECTED NOT DETECTED Final   Metapneumovirus NOT DETECTED NOT DETECTED Final   Rhinovirus / Enterovirus NOT DETECTED NOT DETECTED Final   Influenza A NOT DETECTED NOT DETECTED Final   Influenza B NOT DETECTED NOT DETECTED Final   Parainfluenza Virus 1 NOT DETECTED NOT DETECTED Final   Parainfluenza Virus 2 NOT DETECTED NOT DETECTED Final   Parainfluenza Virus 3 NOT DETECTED NOT DETECTED Final   Parainfluenza Virus 4 NOT DETECTED NOT DETECTED Final   Respiratory Syncytial Virus NOT DETECTED NOT DETECTED Final   Bordetella pertussis NOT DETECTED NOT DETECTED Final   Bordetella Parapertussis NOT DETECTED NOT DETECTED Final   Chlamydophila pneumoniae NOT DETECTED NOT DETECTED Final   Mycoplasma  pneumoniae NOT DETECTED NOT DETECTED Final    Comment: Performed at [facility], [address] [place], [place] [address]  MRSA  Next Gen by PCR, Nasal     Status: None   Collection Time: 08/10/23 11:31 AM   Specimen: Nasal Mucosa; Nasal Swab  Result Value Ref Range Status   MRSA by PCR Next Gen NOT DETECTED NOT DETECTED Final    Comment: (NOTE) The GeneXpert MRSA Assay (FDA approved for NASAL specimens only), is one component of a comprehensive MRSA colonization surveillance program. It is not intended to diagnose MRSA infection nor to guide or monitor treatment for MRSA infections. Test performance is not FDA approved in patients less than 37 years old. Performed at [facility], [address] [place], [place] [address]   Culture, blood (Routine X 2) w Reflex to ID Panel     Status: Abnormal   Collection Time: 08/11/23  8:44 AM   Specimen: BLOOD LEFT ARM  Result Value Ref Range Status   Specimen Description   Final    BLOOD LEFT ARM Performed at [facility], [address] [place], [place] [address]    Special Requests   Final    BOTTLES DRAWN AEROBIC AND ANAEROBIC Blood Culture adequate volume Performed at [facility], [address], [place], [place] [address]    Culture  Setup Time   Final    GRAM POSITIVE COCCI IN PAIRS AND CHAINS IN BOTH AEROBIC AND ANAEROBIC BOTTLES Organism ID to follow CRITICAL RESULT CALLED TO, READ BACK BY AND VERIFIED WITH: E JACKSON,[email or web address]  08/12/23 MK Performed at [facility], [address], [place], [place] [address]    Culture (A)  Final    ENTEROCOCCUS FAECALIS STREPTOCOCCUS MITIS/ORALIS    Report Status 08/15/2023 FINAL  Final   Organism ID, Bacteria ENTEROCOCCUS FAECALIS  Final   Organism ID, Bacteria STREPTOCOCCUS MITIS/ORALIS  Final      Susceptibility   Enterococcus faecalis - MIC*    AMPICILLIN <=2 SENSITIVE Sensitive     VANCOMYCIN 1 SENSITIVE Sensitive     GENTAMICIN SYNERGY SENSITIVE Sensitive     * ENTEROCOCCUS FAECALIS   Streptococcus mitis/oralis - MIC*    PENICILLIN <=0.06 SENSITIVE  Sensitive     CEFTRIAXONE <=0.12 SENSITIVE Sensitive     LEVOFLOXACIN 1 SENSITIVE Sensitive     VANCOMYCIN 0.5 SENSITIVE Sensitive     * STREPTOCOCCUS MITIS/ORALIS  Culture, blood (Routine X 2) w Reflex to ID Panel     Status: None   Collection Time: 08/11/23  8:44 AM   Specimen: BLOOD RIGHT HAND  Result Value Ref Range Status   Specimen Description   Final    BLOOD RIGHT HAND Performed at [facility], [address], [place], [place] [address]    Special Requests   Final    BOTTLES DRAWN AEROBIC ONLY Blood Culture results may not be optimal due to an inadequate volume of blood received in culture bottles Performed at [facility], [address] [place], [place] [address]    Culture   Final    NO GROWTH 5 DAYS Performed at [facility], [address], [place], [place] [address]    Report Status 08/16/2023 FINAL  Final  Blood Culture ID Panel (Reflexed)     Status: Abnormal   Collection Time: 08/11/23  8:44 AM  Result Value Ref Range Status   Enterococcus faecalis DETECTED (A) NOT DETECTED Final  Comment: CRITICAL RESULT CALLED TO, READ BACK BY AND VERIFIED WITH: E JACKSON,[email or web address]  08/12/23 MK    Enterococcus Faecium NOT DETECTED NOT DETECTED Final   Listeria monocytogenes NOT DETECTED NOT DETECTED Final   Staphylococcus species NOT DETECTED NOT DETECTED Final   Staphylococcus aureus (BCID) NOT DETECTED NOT DETECTED Final   Staphylococcus epidermidis NOT DETECTED NOT DETECTED Final   Staphylococcus lugdunensis NOT DETECTED NOT DETECTED Final   Streptococcus species NOT DETECTED NOT DETECTED Final   Streptococcus agalactiae NOT DETECTED NOT DETECTED Final   Streptococcus pneumoniae NOT DETECTED NOT DETECTED Final   Streptococcus pyogenes NOT DETECTED NOT DETECTED Final   A.calcoaceticus-baumannii NOT DETECTED NOT DETECTED Final   Bacteroides fragilis NOT DETECTED NOT DETECTED Final   Enterobacterales NOT DETECTED NOT DETECTED Final    Enterobacter cloacae complex NOT DETECTED NOT DETECTED Final   Escherichia coli NOT DETECTED NOT DETECTED Final   Klebsiella aerogenes NOT DETECTED NOT DETECTED Final   Klebsiella oxytoca NOT DETECTED NOT DETECTED Final   Klebsiella pneumoniae NOT DETECTED NOT DETECTED Final   Proteus species NOT DETECTED NOT DETECTED Final   Salmonella species NOT DETECTED NOT DETECTED Final   Serratia marcescens NOT DETECTED NOT DETECTED Final   Haemophilus influenzae NOT DETECTED NOT DETECTED Final   Neisseria meningitidis NOT DETECTED NOT DETECTED Final   Pseudomonas aeruginosa NOT DETECTED NOT DETECTED Final   Stenotrophomonas maltophilia NOT DETECTED NOT DETECTED Final   Candida albicans NOT DETECTED NOT DETECTED Final   Candida auris NOT DETECTED NOT DETECTED Final   Candida glabrata NOT DETECTED NOT DETECTED Final   Candida krusei NOT DETECTED NOT DETECTED Final   Candida parapsilosis NOT DETECTED NOT DETECTED Final   Candida tropicalis NOT DETECTED NOT DETECTED Final   Cryptococcus neoformans/gattii NOT DETECTED NOT DETECTED Final   Vancomycin resistance NOT DETECTED NOT DETECTED Final    Comment: Performed at [facility], [address], [place], [place] [address]  C Difficile Quick Screen w PCR reflex     Status: None   Collection Time: 08/11/23  1:55 PM   Specimen: STOOL  Result Value Ref Range Status   C Diff antigen NEGATIVE NEGATIVE Final   C Diff toxin NEGATIVE NEGATIVE Final   C Diff interpretation No C. difficile detected.  Final    Comment: Performed at [facility], [address] [place], [place] [address]  Culture, blood (Routine X 2) w Reflex to ID Panel     Status: None   Collection Time: 08/12/23 11:04 AM   Specimen: BLOOD LEFT HAND  Result Value Ref Range Status   Specimen Description   Final    BLOOD LEFT HAND Performed at [facility], [address], [place], [place] [address]    Special Requests   Final    BOTTLES DRAWN AEROBIC AND  ANAEROBIC Blood Culture results may not be optimal due to an inadequate volume of blood received in culture bottles Performed at [facility], [address] [place], [place] [address]    Culture   Final    NO GROWTH 5 DAYS Performed at [facility], [address], [place], [place] [address]    Report Status 08/17/2023 FINAL  Final  Culture, blood (Routine X 2) w Reflex to ID Panel     Status: None   Collection Time: 08/12/23 11:12 AM   Specimen: BLOOD RIGHT HAND  Result Value Ref Range Status   Specimen Description   Final    BLOOD  RIGHT HAND Performed at [facility], [address] [place], [place] [address]    Special Requests   Final    BOTTLES DRAWN AEROBIC AND ANAEROBIC Blood Culture results may not be optimal due to an inadequate volume of blood received in culture bottles Performed at [facility], [address], [place], [place] [address]    Culture   Final    NO GROWTH 5 DAYS Performed at [facility], [address] [place], [place] [address]    Report Status 08/17/2023 FINAL  Final  Gastrointestinal Panel by PCR , Stool     Status: Abnormal   Collection Time: 08/12/23  3:14 PM   Specimen: Stool  Result Value Ref Range Status   Campylobacter species NOT DETECTED NOT DETECTED Final   Plesimonas shigelloides NOT DETECTED NOT DETECTED Final   Salmonella species NOT DETECTED NOT DETECTED Final   Yersinia enterocolitica NOT DETECTED NOT DETECTED Final   Vibrio species NOT DETECTED NOT DETECTED Final   Vibrio cholerae NOT DETECTED NOT DETECTED Final   Enteroaggregative E coli (EAEC) NOT DETECTED NOT DETECTED Final   Enteropathogenic E coli (EPEC) NOT DETECTED NOT DETECTED Final   Enterotoxigenic E coli (ETEC) NOT DETECTED NOT DETECTED Final   Shiga like toxin producing E coli (STEC) NOT DETECTED NOT DETECTED Final   Shigella/Enteroinvasive E coli (EIEC) NOT DETECTED NOT DETECTED Final   Cryptosporidium NOT  DETECTED NOT DETECTED Final   Cyclospora cayetanensis NOT DETECTED NOT DETECTED Final   Entamoeba histolytica NOT DETECTED NOT DETECTED Final   Giardia lamblia NOT DETECTED NOT DETECTED Final   Adenovirus F40/41 NOT DETECTED NOT DETECTED Final   Astrovirus DETECTED (A) NOT DETECTED Final   Norovirus GI/GII NOT DETECTED NOT DETECTED Final   Rotavirus A NOT DETECTED NOT DETECTED Final   Sapovirus (I, II, IV, and V) NOT DETECTED NOT DETECTED Final    Comment: Performed at [facility], [address], [place], [place] [address]     Time coordinating discharge: 39 min SIGNED  Alwyn Ren, MD  [facility] 08/17/2023, 1:39 PM

## 2023-08-17 NOTE — TOC Progression Note (Addendum)
 Transition of Care [facility]) - Progression Note    Patient Details  Name: Glenda Boone MRN: [id number] Date of Birth: 1951-01-11  [facility] CM/SW Contact  Jnya Brossard, Olegario Messier, RN Phone Number: 08/17/2023, 12:13 PM  Clinical Narrative:  spoke to patient's sister Annette Stable rep Melody Comas now agree for return back to [facility] will start auth once PT note available. -1:13p Lacinda Axon has [facility]  rep Adella Nissen please do d/c summary if stable.    Expected Discharge Plan: Skilled Nursing Facility Barriers to Discharge: Insurance Authorization  Expected Discharge Plan and Services       Living arrangements for the past 2 months: Single Family Home (LTC at [place])                                       Social Determinants of Health (SDOH) Interventions SDOH Screenings   Food Insecurity: Patient Declined (08/09/2023)  Housing: Patient Declined (08/09/2023)  Transportation Needs: Patient Declined (08/09/2023)  Utilities: Patient Declined (08/09/2023)  Social Connections: Patient Unable To Answer (08/09/2023)  Tobacco Use: High Risk (08/09/2023)    Readmission Risk Interventions    08/14/2023   12:38 PM 08/12/2023    2:14 PM  Readmission Risk Prevention Plan  Transportation Screening Complete Complete  PCP or Specialist Appt within 5-7 Days Complete Complete  Home Care Screening Complete Complete  Medication Review (RN CM) Complete Complete

## 2023-08-17 NOTE — TOC Progression Note (Addendum)
 Transition of Care [facility]) - Progression Note    Patient Details  Name: NOELE ICENHOUR MRN: [id number] Date of Birth: 22-May-1951  Transition of Care [facility]) CM/SW Contact  Zoe Goonan, Olegario Messier, RN Phone Number: 08/17/2023, 10:15 AM  Clinical Narrative:    Spoke to sister Victorino Dike. She chose a facility.PT needs to see again prior starting auth.likely d/c tomorrow if auth received for [facility] @ [facility].  -10:34a Received call back from Tammy rep from [facility] will start auth once PT note placed. -spoke to patient's sister Annette [employer] rep Melody Comas now agree for return back to [employer] will start auth once PT note available.   Expected Discharge Plan: Skilled Nursing Facility Barriers to Discharge: Continued Medical Work up  Expected Discharge Plan and Services       Living arrangements for the past 2 months: Single Family Home (LTC at [facility])                                       Social Determinants of Health (SDOH) Interventions SDOH Screenings   Food Insecurity: Patient Declined (08/09/2023)  Housing: Patient Declined (08/09/2023)  Transportation Needs: Patient Declined (08/09/2023)  Utilities: Patient Declined (08/09/2023)  Social Connections: Patient Unable To Answer (08/09/2023)  Tobacco Use: High Risk (08/09/2023)    Readmission Risk Interventions    08/14/2023   12:38 PM 08/12/2023    2:14 PM  Readmission Risk Prevention Plan  Transportation Screening Complete Complete  PCP or Specialist Appt within 5-7 Days Complete Complete  Home Care Screening Complete Complete  Medication Review (RN CM) Complete Complete

## 2023-09-25 ENCOUNTER — Inpatient Hospital Stay: Admitting: Pulmonary Disease

## 2023-11-04 ENCOUNTER — Other Ambulatory Visit: Payer: Self-pay | Admitting: Family Medicine

## 2023-11-04 DIAGNOSIS — Z1231 Encounter for screening mammogram for malignant neoplasm of breast: Secondary | ICD-10-CM

## 2023-11-26 ENCOUNTER — Ambulatory Visit

## 2023-12-08 ENCOUNTER — Ambulatory Visit
Admission: RE | Admit: 2023-12-08 | Discharge: 2023-12-08 | Disposition: A | Discharge status: Home / Self Care | Source: Ambulatory Visit | Attending: Family Medicine

## 2023-12-08 DIAGNOSIS — Z1231 Encounter for screening mammogram for malignant neoplasm of breast: Secondary | ICD-10-CM

## 2023-12-29 ENCOUNTER — Other Ambulatory Visit: Payer: Self-pay | Admitting: Family Medicine

## 2023-12-29 DIAGNOSIS — Z1231 Encounter for screening mammogram for malignant neoplasm of breast: Secondary | ICD-10-CM

## 2024-01-12 ENCOUNTER — Encounter (HOSPITAL_COMMUNITY): Payer: Self-pay | Admitting: Internal Medicine

## 2024-01-12 ENCOUNTER — Emergency Department (HOSPITAL_COMMUNITY)

## 2024-01-12 ENCOUNTER — Inpatient Hospital Stay (HOSPITAL_COMMUNITY)
Admission: EM | Admit: 2024-01-12 | Discharge: 2024-01-20 | DRG: 291 | Disposition: A | Discharge status: Skilled Nursing Facility | Source: Skilled Nursing Facility | Attending: Internal Medicine | Admitting: Internal Medicine

## 2024-01-12 DIAGNOSIS — N3001 Acute cystitis with hematuria: Secondary | ICD-10-CM

## 2024-01-12 DIAGNOSIS — Z8616 Personal history of COVID-19: Secondary | ICD-10-CM | POA: Diagnosis not present

## 2024-01-12 DIAGNOSIS — D539 Nutritional anemia, unspecified: Secondary | ICD-10-CM | POA: Diagnosis present

## 2024-01-12 DIAGNOSIS — I480 Paroxysmal atrial fibrillation: Secondary | ICD-10-CM | POA: Diagnosis present

## 2024-01-12 DIAGNOSIS — Z515 Encounter for palliative care: Secondary | ICD-10-CM | POA: Diagnosis not present

## 2024-01-12 DIAGNOSIS — E66813 Obesity, class 3: Secondary | ICD-10-CM | POA: Diagnosis present

## 2024-01-12 DIAGNOSIS — N1832 Chronic kidney disease, stage 3b: Secondary | ICD-10-CM | POA: Diagnosis present

## 2024-01-12 DIAGNOSIS — E874 Mixed disorder of acid-base balance: Secondary | ICD-10-CM | POA: Diagnosis present

## 2024-01-12 DIAGNOSIS — Z6841 Body Mass Index (BMI) 40.0 and over, adult: Secondary | ICD-10-CM | POA: Diagnosis not present

## 2024-01-12 DIAGNOSIS — I10 Essential (primary) hypertension: Secondary | ICD-10-CM | POA: Diagnosis not present

## 2024-01-12 DIAGNOSIS — E870 Hyperosmolality and hypernatremia: Secondary | ICD-10-CM | POA: Diagnosis present

## 2024-01-12 DIAGNOSIS — G629 Polyneuropathy, unspecified: Secondary | ICD-10-CM

## 2024-01-12 DIAGNOSIS — Z8261 Family history of arthritis: Secondary | ICD-10-CM

## 2024-01-12 DIAGNOSIS — F411 Generalized anxiety disorder: Secondary | ICD-10-CM | POA: Diagnosis present

## 2024-01-12 DIAGNOSIS — G9341 Metabolic encephalopathy: Secondary | ICD-10-CM | POA: Diagnosis present

## 2024-01-12 DIAGNOSIS — I272 Pulmonary hypertension, unspecified: Secondary | ICD-10-CM | POA: Diagnosis present

## 2024-01-12 DIAGNOSIS — Z9981 Dependence on supplemental oxygen: Secondary | ICD-10-CM

## 2024-01-12 DIAGNOSIS — Z8639 Personal history of other endocrine, nutritional and metabolic disease: Secondary | ICD-10-CM

## 2024-01-12 DIAGNOSIS — Z90710 Acquired absence of both cervix and uterus: Secondary | ICD-10-CM

## 2024-01-12 DIAGNOSIS — I482 Chronic atrial fibrillation, unspecified: Secondary | ICD-10-CM | POA: Diagnosis present

## 2024-01-12 DIAGNOSIS — Z8543 Personal history of malignant neoplasm of ovary: Secondary | ICD-10-CM

## 2024-01-12 DIAGNOSIS — E1140 Type 2 diabetes mellitus with diabetic neuropathy, unspecified: Secondary | ICD-10-CM | POA: Diagnosis present

## 2024-01-12 DIAGNOSIS — Z993 Dependence on wheelchair: Secondary | ICD-10-CM

## 2024-01-12 DIAGNOSIS — J441 Chronic obstructive pulmonary disease with (acute) exacerbation: Secondary | ICD-10-CM | POA: Diagnosis present

## 2024-01-12 DIAGNOSIS — J189 Pneumonia, unspecified organism: Secondary | ICD-10-CM

## 2024-01-12 DIAGNOSIS — E039 Hypothyroidism, unspecified: Secondary | ICD-10-CM | POA: Diagnosis present

## 2024-01-12 DIAGNOSIS — Z800 Family history of malignant neoplasm of digestive organs: Secondary | ICD-10-CM

## 2024-01-12 DIAGNOSIS — F32A Depression, unspecified: Secondary | ICD-10-CM | POA: Diagnosis present

## 2024-01-12 DIAGNOSIS — E538 Deficiency of other specified B group vitamins: Secondary | ICD-10-CM | POA: Diagnosis present

## 2024-01-12 DIAGNOSIS — E1122 Type 2 diabetes mellitus with diabetic chronic kidney disease: Secondary | ICD-10-CM | POA: Diagnosis present

## 2024-01-12 DIAGNOSIS — I5033 Acute on chronic diastolic (congestive) heart failure: Secondary | ICD-10-CM | POA: Diagnosis present

## 2024-01-12 DIAGNOSIS — K219 Gastro-esophageal reflux disease without esophagitis: Secondary | ICD-10-CM | POA: Diagnosis present

## 2024-01-12 DIAGNOSIS — R0602 Shortness of breath: Secondary | ICD-10-CM | POA: Diagnosis present

## 2024-01-12 DIAGNOSIS — J9622 Acute and chronic respiratory failure with hypercapnia: Secondary | ICD-10-CM | POA: Diagnosis present

## 2024-01-12 DIAGNOSIS — Z7189 Other specified counseling: Secondary | ICD-10-CM | POA: Diagnosis not present

## 2024-01-12 DIAGNOSIS — B961 Klebsiella pneumoniae [K. pneumoniae] as the cause of diseases classified elsewhere: Secondary | ICD-10-CM | POA: Diagnosis present

## 2024-01-12 DIAGNOSIS — I130 Hypertensive heart and chronic kidney disease with heart failure and stage 1 through stage 4 chronic kidney disease, or unspecified chronic kidney disease: Principal | ICD-10-CM | POA: Diagnosis present

## 2024-01-12 DIAGNOSIS — E875 Hyperkalemia: Secondary | ICD-10-CM | POA: Diagnosis not present

## 2024-01-12 DIAGNOSIS — Z7951 Long term (current) use of inhaled steroids: Secondary | ICD-10-CM

## 2024-01-12 DIAGNOSIS — E1169 Type 2 diabetes mellitus with other specified complication: Secondary | ICD-10-CM | POA: Diagnosis present

## 2024-01-12 DIAGNOSIS — J9621 Acute and chronic respiratory failure with hypoxia: Secondary | ICD-10-CM | POA: Diagnosis present

## 2024-01-12 DIAGNOSIS — Z1152 Encounter for screening for COVID-19: Secondary | ICD-10-CM | POA: Diagnosis not present

## 2024-01-12 DIAGNOSIS — E871 Hypo-osmolality and hyponatremia: Secondary | ICD-10-CM | POA: Diagnosis present

## 2024-01-12 DIAGNOSIS — Z66 Do not resuscitate: Secondary | ICD-10-CM | POA: Diagnosis present

## 2024-01-12 DIAGNOSIS — Z885 Allergy status to narcotic agent status: Secondary | ICD-10-CM

## 2024-01-12 DIAGNOSIS — Z8673 Personal history of transient ischemic attack (TIA), and cerebral infarction without residual deficits: Secondary | ICD-10-CM | POA: Diagnosis not present

## 2024-01-12 DIAGNOSIS — Z79899 Other long term (current) drug therapy: Secondary | ICD-10-CM

## 2024-01-12 DIAGNOSIS — Z91013 Allergy to seafood: Secondary | ICD-10-CM

## 2024-01-12 DIAGNOSIS — E876 Hypokalemia: Secondary | ICD-10-CM | POA: Diagnosis not present

## 2024-01-12 DIAGNOSIS — E785 Hyperlipidemia, unspecified: Secondary | ICD-10-CM | POA: Diagnosis present

## 2024-01-12 DIAGNOSIS — Z7984 Long term (current) use of oral hypoglycemic drugs: Secondary | ICD-10-CM

## 2024-01-12 DIAGNOSIS — F17210 Nicotine dependence, cigarettes, uncomplicated: Secondary | ICD-10-CM | POA: Diagnosis present

## 2024-01-12 DIAGNOSIS — J9601 Acute respiratory failure with hypoxia: Principal | ICD-10-CM

## 2024-01-12 DIAGNOSIS — Z79890 Hormone replacement therapy (postmenopausal): Secondary | ICD-10-CM

## 2024-01-12 DIAGNOSIS — Z8249 Family history of ischemic heart disease and other diseases of the circulatory system: Secondary | ICD-10-CM

## 2024-01-12 DIAGNOSIS — Z7901 Long term (current) use of anticoagulants: Secondary | ICD-10-CM

## 2024-01-12 DIAGNOSIS — Z888 Allergy status to other drugs, medicaments and biological substances status: Secondary | ICD-10-CM

## 2024-01-12 LAB — I-STAT VENOUS BLOOD GAS, ED
Acid-Base Excess: 18 mmol/L — ABNORMAL HIGH (ref 0.0–2.0)
Acid-Base Excess: 16 mmol/L — ABNORMAL HIGH (ref 0.0–2.0)
Bicarbonate: 43.5 mmol/L — ABNORMAL HIGH (ref 20.0–28.0)
Bicarbonate: 47.9 mmol/L — ABNORMAL HIGH (ref 20.0–28.0)
Calcium, Ion: 0.92 mmol/L — ABNORMAL LOW (ref 1.15–1.40)
Calcium, Ion: 1.01 mmol/L — ABNORMAL LOW (ref 1.15–1.40)
HCT: 38 % (ref 36.0–46.0)
HCT: 35 % — ABNORMAL LOW (ref 36.0–46.0)
Hemoglobin: 12.9 g/dL (ref 12.0–15.0)
Hemoglobin: 11.9 g/dL — ABNORMAL LOW (ref 12.0–15.0)
O2 Saturation: 100 %
O2 Saturation: 99 %
Potassium: 4.7 mmol/L (ref 3.5–5.1)
Potassium: 4.3 mmol/L (ref 3.5–5.1)
Sodium: 138 mmol/L (ref 135–145)
Sodium: 141 mmol/L (ref 135–145)
TCO2: 45 mmol/L — ABNORMAL HIGH (ref 22–32)
TCO2: >50 mmol/L — ABNORMAL HIGH (ref 22–32)
pCO2, Ven: 51.8 mmHg (ref 44–60)
pCO2, Ven: 107.2 mmHg (ref 44–60)
pH, Ven: 7.532 — ABNORMAL HIGH (ref 7.25–7.43)
pH, Ven: 7.258 (ref 7.25–7.43)
pO2, Ven: 200 mmHg — ABNORMAL HIGH (ref 32–45)
pO2, Ven: 181 mmHg — ABNORMAL HIGH (ref 32–45)

## 2024-01-12 LAB — URINALYSIS, ROUTINE W REFLEX MICROSCOPIC
Bilirubin Urine: NEGATIVE
Glucose, UA: >=500 mg/dL — AB
Ketones, ur: NEGATIVE mg/dL
Nitrite: NEGATIVE
Protein, ur: NEGATIVE mg/dL
Specific Gravity, Urine: 1.01 (ref 1.005–1.030)
WBC, UA: >50 WBC/hpf (ref 0–5)
pH: 5 (ref 5.0–8.0)

## 2024-01-12 LAB — CBC WITH DIFFERENTIAL/PLATELET
Abs Immature Granulocytes: 0.06 K/uL (ref 0.00–0.07)
Basophils Absolute: 0.1 K/uL (ref 0.0–0.1)
Basophils Relative: 1 %
Eosinophils Absolute: 0.3 K/uL (ref 0.0–0.5)
Eosinophils Relative: 4 %
HCT: 40.8 % (ref 36.0–46.0)
Hemoglobin: 11.2 g/dL — ABNORMAL LOW (ref 12.0–15.0)
Immature Granulocytes: 1 %
Lymphocytes Relative: 14 %
Lymphs Abs: 1.1 K/uL (ref 0.7–4.0)
MCH: 29.9 pg (ref 26.0–34.0)
MCHC: 27.5 g/dL — ABNORMAL LOW (ref 30.0–36.0)
MCV: 108.8 fL — ABNORMAL HIGH (ref 80.0–100.0)
Monocytes Absolute: 0.4 K/uL (ref 0.1–1.0)
Monocytes Relative: 5 %
Neutro Abs: 6.1 K/uL (ref 1.7–7.7)
Neutrophils Relative %: 75 %
Platelets: 221 K/uL (ref 150–400)
RBC: 3.75 MIL/uL — ABNORMAL LOW (ref 3.87–5.11)
RDW: 16.6 % — ABNORMAL HIGH (ref 11.5–15.5)
WBC: 8 K/uL (ref 4.0–10.5)
nRBC: 0 % (ref 0.0–0.2)

## 2024-01-12 LAB — AMMONIA: Ammonia: 60 umol/L — ABNORMAL HIGH (ref 9–35)

## 2024-01-12 LAB — RESP PANEL BY RT-PCR (RSV, FLU A&B, COVID)  RVPGX2
Influenza A by PCR: NEGATIVE
Influenza B by PCR: NEGATIVE
Resp Syncytial Virus by PCR: NEGATIVE
SARS Coronavirus 2 by RT PCR: NEGATIVE

## 2024-01-12 LAB — I-STAT CG4 LACTIC ACID, ED
Lactic Acid, Venous: 1.9 mmol/L (ref 0.5–1.9)
Lactic Acid, Venous: 1.5 mmol/L (ref 0.5–1.9)

## 2024-01-12 LAB — BASIC METABOLIC PANEL WITH GFR
Anion gap: 13 (ref 5–15)
BUN: 26 mg/dL — ABNORMAL HIGH (ref 8–23)
CO2: 36 mmol/L — ABNORMAL HIGH (ref 22–32)
Calcium: 8.3 mg/dL — ABNORMAL LOW (ref 8.9–10.3)
Chloride: 91 mmol/L — ABNORMAL LOW (ref 98–111)
Creatinine, Ser: 1.47 mg/dL — ABNORMAL HIGH (ref 0.44–1.00)
GFR, Estimated: 37 mL/min — ABNORMAL LOW (ref >60)
Glucose, Bld: 172 mg/dL — ABNORMAL HIGH (ref 70–99)
Potassium: 5 mmol/L (ref 3.5–5.1)
Sodium: 140 mmol/L (ref 135–145)

## 2024-01-12 LAB — BRAIN NATRIURETIC PEPTIDE: B Natriuretic Peptide: 104.8 pg/mL — ABNORMAL HIGH (ref 0.0–100.0)

## 2024-01-12 LAB — TROPONIN I (HIGH SENSITIVITY)
Troponin I (High Sensitivity): 7 ng/L (ref <18)
Troponin I (High Sensitivity): 9 ng/L (ref <18)

## 2024-01-12 LAB — PROCALCITONIN: Procalcitonin: <0.1 ng/mL

## 2024-01-12 MED ORDER — ALBUTEROL SULFATE (2.5 MG/3ML) 0.083% IN NEBU
10.0000 mg | INHALATION_SOLUTION | Freq: Once | RESPIRATORY_TRACT | Status: AC
  Administered 2024-01-12: 10 mg via RESPIRATORY_TRACT
  Filled 2024-01-12: qty 12

## 2024-01-12 MED ORDER — ACETAMINOPHEN 325 MG PO TABS
650.0000 mg | ORAL_TABLET | Freq: Four times a day (QID) | ORAL | Status: DC | PRN
  Administered 2024-01-15 – 2024-01-20 (×6): 650 mg via ORAL
  Filled 2024-01-12 (×7): qty 2

## 2024-01-12 MED ORDER — ONDANSETRON HCL 4 MG/2ML IJ SOLN
4.0000 mg | Freq: Once | INTRAMUSCULAR | Status: AC
  Administered 2024-01-12: 4 mg via INTRAVENOUS
  Filled 2024-01-12: qty 2

## 2024-01-12 MED ORDER — FUROSEMIDE 10 MG/ML IJ SOLN
60.0000 mg | Freq: Two times a day (BID) | INTRAMUSCULAR | Status: DC
  Administered 2024-01-12 – 2024-01-14 (×4): 60 mg via INTRAVENOUS
  Filled 2024-01-12 (×4): qty 6

## 2024-01-12 MED ORDER — METHYLPREDNISOLONE SODIUM SUCC 125 MG IJ SOLR: 125.0000 mg | Freq: Two times a day (BID) | INTRAMUSCULAR | Status: DC

## 2024-01-12 MED ORDER — SODIUM CHLORIDE 0.9 % IV SOLN
500.0000 mg | Freq: Once | INTRAVENOUS | Status: AC
  Administered 2024-01-12: 500 mg via INTRAVENOUS
  Filled 2024-01-12: qty 5

## 2024-01-12 MED ORDER — ATORVASTATIN CALCIUM 10 MG PO TABS
20.0000 mg | ORAL_TABLET | Freq: Every day | ORAL | Status: DC
  Administered 2024-01-14 – 2024-01-19 (×6): 20 mg via ORAL
  Filled 2024-01-12 (×6): qty 2

## 2024-01-12 MED ORDER — APIXABAN 5 MG PO TABS
5.0000 mg | ORAL_TABLET | Freq: Two times a day (BID) | ORAL | Status: DC
  Administered 2024-01-13 – 2024-01-20 (×13): 5 mg via ORAL
  Filled 2024-01-12 (×14): qty 1

## 2024-01-12 MED ORDER — POLYETHYLENE GLYCOL 3350 17 G PO PACK
17.0000 g | PACK | Freq: Every day | ORAL | Status: DC | PRN
  Administered 2024-01-15: 17 g via ORAL
  Filled 2024-01-12: qty 1

## 2024-01-12 MED ORDER — METHYLPREDNISOLONE SODIUM SUCC 125 MG IJ SOLR
125.0000 mg | Freq: Once | INTRAMUSCULAR | Status: AC
  Administered 2024-01-12: 125 mg via INTRAVENOUS
  Filled 2024-01-12: qty 2

## 2024-01-12 MED ORDER — LEVOTHYROXINE SODIUM 100 MCG PO TABS
200.0000 ug | ORAL_TABLET | Freq: Every day | ORAL | Status: DC
  Administered 2024-01-15 – 2024-01-20 (×6): 200 ug via ORAL
  Filled 2024-01-12 (×7): qty 2

## 2024-01-12 MED ORDER — IOHEXOL 350 MG/ML SOLN
75.0000 mL | Freq: Once | INTRAVENOUS | Status: AC | PRN
  Administered 2024-01-12: 75 mL via INTRAVENOUS

## 2024-01-12 MED ORDER — METOPROLOL SUCCINATE ER 25 MG PO TB24
12.5000 mg | ORAL_TABLET | Freq: Every day | ORAL | Status: DC
  Filled 2024-01-12: qty 1

## 2024-01-12 MED ORDER — FUROSEMIDE 20 MG PO TABS: 40.0000 mg | ORAL_TABLET | Freq: Two times a day (BID) | ORAL | Status: DC

## 2024-01-12 MED ORDER — FUROSEMIDE 10 MG/ML IJ SOLN
80.0000 mg | Freq: Once | INTRAMUSCULAR | Status: AC
  Administered 2024-01-12: 80 mg via INTRAVENOUS
  Filled 2024-01-12: qty 8

## 2024-01-12 MED ORDER — BUDESONIDE 0.25 MG/2ML IN SUSP
0.2500 mg | Freq: Two times a day (BID) | RESPIRATORY_TRACT | Status: DC
  Administered 2024-01-13 – 2024-01-15 (×5): 0.25 mg via RESPIRATORY_TRACT
  Filled 2024-01-12 (×5): qty 2

## 2024-01-12 MED ORDER — IPRATROPIUM BROMIDE 0.02 % IN SOLN
0.5000 mg | Freq: Four times a day (QID) | RESPIRATORY_TRACT | Status: DC
  Administered 2024-01-12 – 2024-01-14 (×8): 0.5 mg via RESPIRATORY_TRACT
  Filled 2024-01-12 (×7): qty 2.5

## 2024-01-12 MED ORDER — ACETAMINOPHEN 650 MG RE SUPP: 650.0000 mg | Freq: Four times a day (QID) | RECTAL | Status: DC | PRN

## 2024-01-12 MED ORDER — MORPHINE SULFATE (PF) 4 MG/ML IV SOLN
4.0000 mg | Freq: Once | INTRAVENOUS | Status: AC
  Administered 2024-01-12: 4 mg via INTRAVENOUS
  Filled 2024-01-12: qty 1

## 2024-01-12 MED ORDER — ALBUTEROL SULFATE (2.5 MG/3ML) 0.083% IN NEBU
2.5000 mg | INHALATION_SOLUTION | RESPIRATORY_TRACT | Status: DC | PRN
  Administered 2024-01-13: 2.5 mg via RESPIRATORY_TRACT
  Filled 2024-01-12: qty 3

## 2024-01-12 MED ORDER — PREDNISONE 5 MG PO TABS: 50.0000 mg | ORAL_TABLET | Freq: Every day | ORAL | Status: DC

## 2024-01-12 MED ORDER — SODIUM CHLORIDE 0.9% FLUSH
3.0000 mL | Freq: Two times a day (BID) | INTRAVENOUS | Status: DC
  Administered 2024-01-12 – 2024-01-20 (×16): 3 mL via INTRAVENOUS

## 2024-01-12 MED ORDER — SODIUM CHLORIDE 0.9 % IV SOLN
2.0000 g | Freq: Once | INTRAVENOUS | Status: AC
  Administered 2024-01-12: 2 g via INTRAVENOUS
  Filled 2024-01-12: qty 20

## 2024-01-12 MED ORDER — PREDNISONE 20 MG PO TABS: 40.0000 mg | ORAL_TABLET | Freq: Every day | ORAL | Status: DC

## 2024-01-12 NOTE — ED Provider Notes (Signed)
 [facility] Provider Note  CSN: [id number] Arrival date & time: 01/12/24 1008  Chief Complaint(s) Respiratory Distress  HPI Glenda Boone is a 73 y.o. female with past medical history as below, significant for COPD, chronic respiratory failure, HFpEF, A-fib, hypertension, obesity, DNR/DNI who presents to the ED with complaint of respiratory distress  Patient arrives from SNF secondary to respiratory distress, lethargic.  On EMS arrival pulse ox was in the 70s on 4 L nasal cannula, patient was switched to CPAP and pulse ox did improve.  Upon arrival patient appears to be in respiratory distress, she was transitioned to BiPAP with continued improvement to her respiratory status.  She appears overtly volume overloaded.  She has limited responsiveness to questions but is able to move her extremity spontaneously and open her eyes.  DNR form in hand  Past Medical History Past Medical History:  Diagnosis Date   Acute metabolic encephalopathy 12/30/2019   Acute on chronic congestive heart failure (HCC) 08/09/2023   Acute on chronic hypoxic respiratory failure (HCC) 05/23/2022   Anxiety    Atrial fibrillation with rapid ventricular response (HCC) 02/11/2020   Bronchitis    COPD (chronic obstructive pulmonary disease) (HCC)    Depression    Diabetes mellitus, type II (HCC)    Dyspnea    Enterococcal bacteremia 08/13/2023   GERD (gastroesophageal reflux disease)    Hyperlipidemia    Hypertension    Hypothyroidism    Neuropathy    Ovarian cancer (HCC)    Pneumonia due to COVID-19 virus 01/31/2020   Shingles    Shingles outbreak 06/21/2015   Streptococcal bacteremia 08/14/2023   Thyroid  disease    Patient Active Problem List   Diagnosis Date Noted   Acute on chronic respiratory failure with hypoxia (HCC) 01/12/2024   Diarrhea 08/13/2023   Pressure injury of skin 08/12/2023   Chronic atrial fibrillation (HCC) 05/30/2022   Closed fracture  of left ankle 05/24/2022   COPD exacerbation (HCC) 03/28/2022   Acquired thrombophilia (HCC) 02/12/2020   Current use of long term anticoagulation 02/12/2020   Current every day smoker 02/12/2020   COPD (chronic obstructive pulmonary disease) (HCC) 01/31/2020   Essential hypertension 01/31/2020   Obesity, Class III, BMI 40-49.9 (morbid obesity) 01/31/2020   Type 2 diabetes mellitus with hyperlipidemia (HCC)    Hypothyroidism    History of TIA (transient ischemic attack) 09/04/2019   Aphagia 03/09/2019   Chronic bronchitis (HCC) 06/21/2015   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HCC) 11/28/2014   H/O diabetes mellitus 11/28/2014   Anxiety, generalized 11/28/2014   H/O hypercholesterolemia 11/28/2014   H/O: hypothyroidism 11/28/2014   Depression, major, recurrent, moderate (HCC) 11/28/2014   H/O disease 11/28/2014   Neuropathy 11/28/2014   Home Medication(s) Prior to Admission medications   Medication Sig Start Date End Date Taking? Authorizing Provider  acetaminophen  (TYLENOL ) 325 MG tablet Take 650 mg by mouth every 6 (six) hours as needed (general discomfort, fever >/= 101F).   Yes [provider]  albuterol  (VENTOLIN  HFA) 108 (90 Base) MCG/ACT inhaler Inhale 2 puffs into the lungs every 6 (six) hours as needed for wheezing or shortness of breath. 01/01/20  Yes Gonfa, Taye T, MD  apixaban  (ELIQUIS ) 5 MG TABS tablet Take 1 tablet (5 mg total) by mouth 2 (two) times daily. 02/16/20  Yes Awanda City, MD  atorvastatin  (LIPITOR) 20 MG tablet Take 1 tablet (20 mg total) by mouth daily. Patient taking differently: Take 20 mg by mouth at bedtime. 05/31/22  Yes Nooruddin, Saad, MD  barrier cream (NON-SPECIFIED) CREA Apply 1 Application topically See admin instructions. Apply to labia and groin area every day and night shift and apply three times daily with every shift with all incontinent care.   Yes [provider]  budesonide  (PULMICORT ) 0.25 MG/2ML nebulizer solution Take 2 mLs  (0.25 mg total) by nebulization 2 (two) times daily. 08/17/23  Yes Will Almarie MATSU, MD  buPROPion  (WELLBUTRIN ) 75 MG tablet Take 75 mg by mouth daily.   Yes [provider]  busPIRone  (BUSPAR ) 7.5 MG tablet Take 7.5 mg by mouth 2 (two) times daily.   Yes [provider]  cyclobenzaprine  (FLEXERIL ) 5 MG tablet Take 5 mg by mouth daily.   Yes [provider]  dapagliflozin  propanediol (FARXIGA ) 10 MG TABS tablet Take 1 tablet (10 mg total) by mouth daily. 05/31/22  Yes Nooruddin, Saad, MD  furosemide  (LASIX ) 40 MG tablet Take 1 tablet (40 mg total) by mouth 2 (two) times daily. Patient taking differently: Take 20-40 mg by mouth See admin instructions. Give 1 tablet (40mg ) by mouth twice daily and give 1/2 tablet (20mg ) by mouth in the afternoon for weight gain in CHF for 8 days. 08/17/23  Yes Will Almarie MATSU, MD  levothyroxine  (SYNTHROID ) 200 MCG tablet Take 200 mcg by mouth daily.   Yes [provider]  lidocaine  4 % Place 1 patch onto the skin daily.   Yes [provider]  linagliptin  (TRADJENTA ) 5 MG TABS tablet Take 5 mg by mouth daily.   Yes [provider]  metoprolol  succinate (TOPROL -XL) 25 MG 24 hr tablet Take 0.5 tablets (12.5 mg total) by mouth daily. 08/18/23  Yes Will Almarie MATSU, MD  nystatin  cream (MYCOSTATIN ) Apply 1 Application topically 2 (two) times daily.   Yes [provider]  OXYGEN Inhale 2 L/min into the lungs continuous.   Yes [provider]  polyethylene glycol powder (MIRALAX ) 17 GM/SCOOP powder Take 17 g by mouth daily as needed (constipation).   Yes [provider]  tetrahydrozoline 0.05 % ophthalmic solution Place 2 drops into both eyes daily.   Yes [provider]  tolterodine (DETROL) 1 MG tablet Take 0.5 mg by mouth daily.   Yes [provider]  umeclidinium-vilanterol (ANORO ELLIPTA) 62.5-25 MCG/ACT AEPB Inhale 1 puff into the lungs daily.   Yes [provider]  Vibegron (GEMTESA) 75 MG TABS Take 75 mg by mouth daily.   Yes [provider]  Zinc Oxide 20 % PSTE Apply 1 application  topically every 6 (six) hours as needed (vaginal irritation). unsupervised self-administration   Yes [provider]                                                                                                                                    Past Surgical History Past Surgical History:  Procedure Laterality Date   ABDOMINAL HYSTERECTOMY  ANKLE SURGERY Left    CHOLECYSTECTOMY     OVARY SURGERY     SMALL INTESTINE SURGERY     TONSILLECTOMY     Family History Family History  Problem Relation Age of Onset   Heart Problems Mother    Hypertension Mother    Colon cancer Father    Arthritis-Osteo Sister     Social History Social History   Tobacco Use   Smoking status: Every Day    Current packs/day: 1.00    Average packs/day: 1 pack/day for 44.1 years (44.1 ttl pk-yrs)    Types: Cigarettes    Start date: 12/19/1979   Smokeless tobacco: Never  Substance Use Topics   Alcohol  use: No    Alcohol /week: 0.0 standard drinks of alcohol    Drug use: No   Allergies Codeine, Shellfish allergy, Demerol [meperidine hcl], and Neurontin  [gabapentin ]  Review of Systems A thorough review of systems was obtained and all systems are negative except as noted in the HPI and PMH.   Physical Exam Vital Signs  I have reviewed the triage vital signs BP (!) 131/111   Pulse 75   Resp 17   SpO2 97%  Physical Exam Vitals and nursing note reviewed.  Constitutional:      General: She is in acute distress.     Appearance: Normal appearance. She is obese. She is ill-appearing.  HENT:     Head: Normocephalic and atraumatic.     Right Ear: External ear normal.     Left Ear: External ear normal.     Nose: Nose normal.     Mouth/Throat:     Mouth: Mucous membranes are moist.  Eyes:     General: No scleral icterus.       Right eye: No  discharge.        Left eye: No discharge.  Cardiovascular:     Rate and Rhythm: Normal rate and regular rhythm.     Pulses: Normal pulses.     Heart sounds: Normal heart sounds.  Pulmonary:     Effort: Pulmonary effort is normal. Tachypnea present. No respiratory distress.     Breath sounds: Decreased air movement present. No stridor. Decreased breath sounds and wheezing present.  Abdominal:     General: Abdomen is flat. There is no distension.     Palpations: Abdomen is soft.     Tenderness: There is no abdominal tenderness.  Musculoskeletal:     Cervical back: No rigidity.     Right lower leg: No edema.     Left lower leg: No edema.  Skin:    General: Skin is warm and dry.     Capillary Refill: Capillary refill takes less than 2 seconds.  Neurological:     Mental Status: She is alert.  Psychiatric:        Mood and Affect: Mood normal.        Behavior: Behavior normal. Behavior is cooperative.     ED Results and Treatments Labs (all labs ordered are listed, but only abnormal results are displayed) Labs Reviewed  BASIC METABOLIC PANEL WITH GFR - Abnormal; Notable for the following components:      Result Value   Chloride 91 (*)    CO2 36 (*)    Glucose, Bld 172 (*)    BUN 26 (*)    Creatinine, Ser 1.47 (*)    Calcium  8.3 (*)    GFR, Estimated 37 (*)    All other components within normal limits  CBC WITH  DIFFERENTIAL/PLATELET - Abnormal; Notable for the following components:   RBC 3.75 (*)    Hemoglobin 11.2 (*)    MCV 108.8 (*)    MCHC 27.5 (*)    RDW 16.6 (*)    All other components within normal limits  BRAIN NATRIURETIC PEPTIDE - Abnormal; Notable for the following components:   B Natriuretic Peptide 104.8 (*)    All other components within normal limits  URINALYSIS, ROUTINE W REFLEX MICROSCOPIC - Abnormal; Notable for the following components:   APPearance CLOUDY (*)    Glucose, UA >=500 (*)    Hgb urine dipstick SMALL (*)    Leukocytes,Ua LARGE (*)     Bacteria, UA MANY (*)    All other components within normal limits  AMMONIA - Abnormal; Notable for the following components:   Ammonia 60 (*)    All other components within normal limits  I-STAT VENOUS BLOOD GAS, ED - Abnormal; Notable for the following components:   pH, Ven 7.532 (*)    pO2, Ven 200 (*)    Bicarbonate 43.5 (*)    TCO2 45 (*)    Acid-Base Excess 18.0 (*)    Calcium , Ion 0.92 (*)    All other components within normal limits  RESP PANEL BY RT-PCR (RSV, FLU A&B, COVID)  RVPGX2  EXPECTORATED SPUTUM ASSESSMENT W GRAM STAIN, RFLX TO RESP C  URINE CULTURE  PROCALCITONIN  I-STAT CG4 LACTIC ACID, ED  I-STAT CG4 LACTIC ACID, ED  I-STAT VENOUS BLOOD GAS, ED  I-STAT VENOUS BLOOD GAS, ED  TROPONIN I (HIGH SENSITIVITY)  TROPONIN I (HIGH SENSITIVITY)                                                                                                                          Radiology CT Angio Chest PE W and/or Wo Contrast Result Date: 01/12/2024 EXAM: CTA CHEST AORTA 01/12/2024 12:35:50 PM TECHNIQUE: CTA of the chest was performed after the administration of intravenous contrast. Multiplanar reformatted images are provided for review. MIP images are provided for review. Automated exposure control, iterative reconstruction, and/or weight based adjustment of the mA/kV was utilized to reduce the radiation dose to as low as reasonably achievable. COMPARISON: CT dated 09/10/2010. CLINICAL HISTORY: Pulmonary embolism (PE) suspected, high prob. Triage notes; Pt BIB [facility] from [facility] for lethargy/resp distress. Arrived on CPAP with EMS. Pt was found to be slow to respond, at facility, 76% on 4L, diminished lung sounds. PT has hx of intubation. Also hx of CHF \\T \ CKD, swelling in BL all extremities; 34/84 97% CPAP, HR 82, CBG 178. Duoneb given. Facility increased her Lasix  dose this week. FINDINGS: AORTA: Aortic atherosclerosis (ICD10-I70.0). MEDIASTINUM: Moderate cardiomegaly. 3 vessel  Coronary artery atherosclerosis. LYMPH NODES: Extremely limited evaluation for thoracic adenopathy. LUNGS AND PLEURA: Right greater than left base dependent air space disease. No large pleural effusion. Tracheobronchial narrowing is likely related to expiratory imaging and a component of tracheomalacia. Right greater than left lower lobe dependent airspace  disease. No saddle type pulmonary embolism, no lobar embolism to the right lung base. Pulmonary artery enlargement suggests pulmonary arterial hypertension. UPPER ABDOMEN: Hepatic steatosis with mild caudate lobe enlargement. An incompletely imaged right adrenal mass measures 3.3 x 3.1 cm on image 135 of series 5, similar to 09/10/2010. SOFT TISSUES AND BONES: No acute bone or soft tissue abnormality. ARTIFACTS/LIMITATIONS: Severely degraded, nearly nondiagnostic evaluation secondary to patient body habitus and position, as well as motion. IMPRESSION: 1. Severely limited, essentially nondiagnostic exam for evaluation of pulmonary emboli beyond the outflow tract. 2. Right greater than left lower lobe dependent airspace disease, possibly atelectasis. At the right lung base, Infection or aspiration cannot be excluded. 3. Pulmonary artery enlargement suggests pulmonary arterial hypertension. 4. Moderate cardiomegaly. Aortic atherosclerosis (icd10-i70.0). Coronary artery atherosclerosis. 5. Hepatic steatosis with caudate lobe enlargement. Cannot exclude mild cirrhosis. 6. Right adrenal mass, similar to prior study from 09/10/10, presumed benign. Electronically signed by: Rockey Kilts MD 01/12/2024 01:14 PM EDT RP Workstation: [id number]   DG Chest Port 1 View Result Date: 01/12/2024 CLINICAL DATA:  Respiratory distress. EXAM: PORTABLE CHEST 1 VIEW COMPARISON:  August 14, 2023. FINDINGS: Stable cardiomegaly with mild central pulmonary vascular congestion. Hypoinflation of the lungs is noted with elevated right hemidiaphragm. Minimal pulmonary edema may be present. Bony  thorax is unremarkable. IMPRESSION: Stable cardiomegaly with mild central pulmonary vascular congestion. Minimal pulmonary edema may be present. Electronically Signed   By: Lynwood Landy Raddle M.D.   On: 01/12/2024 11:29    Pertinent labs & imaging results that were available during my care of the patient were reviewed by me and considered in my medical decision making (see MDM for details).  Medications Ordered in ED Medications  azithromycin  (ZITHROMAX ) 500 mg in sodium chloride  0.9 % 250 mL IVPB (500 mg Intravenous New Bag/Given 01/12/24 1519)  atorvastatin  (LIPITOR) tablet 20 mg (has no administration in time range)  metoprolol  succinate (TOPROL -XL) 24 hr tablet 12.5 mg (has no administration in time range)  levothyroxine  (SYNTHROID ) tablet 200 mcg (has no administration in time range)  apixaban  (ELIQUIS ) tablet 5 mg (has no administration in time range)  budesonide  (PULMICORT ) nebulizer solution 0.25 mg (has no administration in time range)  furosemide  (LASIX ) injection 60 mg (has no administration in time range)  ipratropium (ATROVENT ) nebulizer solution 0.5 mg (0.5 mg Nebulization Given 01/12/24 1452)  albuterol  (PROVENTIL ) (2.5 MG/3ML) 0.083% nebulizer solution 2.5 mg (has no administration in time range)  sodium chloride  flush (NS) 0.9 % injection 3 mL (has no administration in time range)  acetaminophen  (TYLENOL ) tablet 650 mg (has no administration in time range)    Or  acetaminophen  (TYLENOL ) suppository 650 mg (has no administration in time range)  polyethylene glycol (MIRALAX  / GLYCOLAX ) packet 17 g (has no administration in time range)  predniSONE  (DELTASONE ) tablet 50 mg (has no administration in time range)  furosemide  (LASIX ) injection 80 mg (80 mg Intravenous Given 01/12/24 1057)  methylPREDNISolone  sodium succinate (SOLU-MEDROL ) 125 mg/2 mL injection 125 mg (125 mg Intravenous Given 01/12/24 1237)  albuterol  (PROVENTIL ) (2.5 MG/3ML) 0.083% nebulizer solution 10 mg (10 mg  Nebulization Given 01/12/24 1209)  iohexol  (OMNIPAQUE ) 350 MG/ML injection 75 mL (75 mLs Intravenous Contrast Given 01/12/24 1238)  cefTRIAXone  (ROCEPHIN ) 2 g in sodium chloride  0.9 % 100 mL IVPB (2 g Intravenous New Bag/Given 01/12/24 1519)  morphine  (PF) 4 MG/ML injection 4 mg (4 mg Intravenous Given 01/12/24 1509)  ondansetron  (ZOFRAN ) injection 4 mg (4 mg Intravenous Given 01/12/24 1509)  Procedures .Critical Care  Performed by: Elnor Jayson LABOR, DO Authorized by: Elnor Jayson LABOR, DO   Critical care provider statement:    Critical care time (minutes):  30   Critical care time was exclusive of:  Separately billable procedures and treating other patients   Critical care was necessary to treat or prevent imminent or life-threatening deterioration of the following conditions:  Respiratory failure   Critical care was time spent personally by me on the following activities:  Development of treatment plan with patient or surrogate, discussions with consultants, evaluation of patient's response to treatment, examination of patient, ordering and review of laboratory studies, ordering and review of radiographic studies, ordering and performing treatments and interventions, pulse oximetry, re-evaluation of patient's condition, review of old charts and obtaining history from patient or surrogate   Care discussed with: admitting provider     (including critical care time)  Medical Decision Making / ED Course    Medical Decision Making:    Glenda Boone is a 73 y.o. female with past medical history as below, significant for COPD, chronic respiratory failure, HFpEF, A-fib, hypertension, obesity, DNR/DNI who presents to the ED with complaint of respiratory distress. The complaint involves an extensive differential diagnosis and also carries with it a high risk of complications  and morbidity.  Serious etiology was considered. Ddx includes but is not limited to: In my evaluation of this patient's dyspnea my DDx includes, but is not limited to, pneumonia, pulmonary embolism, pneumothorax, pulmonary edema, metabolic acidosis, asthma, COPD, cardiac cause, anemia, anxiety, etc.    Complete initial physical exam performed, notably the patient was in acute respiratory distress.    Reviewed and confirmed nursing documentation for past medical history, family history, social history.  Vital signs reviewed.    Acute on chronic respiratory failure with hypoxia Fluid overload, hx CHF Pneumonia COPD exacerbation> -Arrived on CPAP, transition to BiPAP -Patient is DNR/DNI, resides at SNF - Respiratory distress seems multifactorial, she has slight wheeze> given 1 hour nebulized breathing treatment and Solu-Medrol  with some improvement - She appears overtly volume overloaded externally and was started on diuresis, BNP is not acutely elevated ( she is obese, BNP perhaps artificially now), does have evidence of CHF on her x-ray, - possible PNA on CT, start rocephin /azithro; RVP neg  Acute cystitis> - UTI noted on UA, send culture, given Rocephin  - No vomiting or significant abdominal pain, less likely pyelonephritis  Plan to admit patient for acute on chronic respiratory failure, likely influenced by pneumonia, COPD and CHF.  She also has concurrent UTI.  Admit to hospitalist.                         Additional history obtained: -Additional history obtained from ems -External records from outside source obtained and reviewed including: Chart review including previous notes, labs, imaging, consultation notes including  Recent admission, medications, prior labs   Lab Tests: -I ordered, reviewed, and interpreted labs.   The pertinent results include:   Labs Reviewed  BASIC METABOLIC PANEL WITH GFR - Abnormal; Notable for the following components:       Result Value   Chloride 91 (*)    CO2 36 (*)    Glucose, Bld 172 (*)    BUN 26 (*)    Creatinine, Ser 1.47 (*)    Calcium  8.3 (*)    GFR, Estimated 37 (*)    All other components within normal limits  CBC WITH DIFFERENTIAL/PLATELET - Abnormal;  Notable for the following components:   RBC 3.75 (*)    Hemoglobin 11.2 (*)    MCV 108.8 (*)    MCHC 27.5 (*)    RDW 16.6 (*)    All other components within normal limits  BRAIN NATRIURETIC PEPTIDE - Abnormal; Notable for the following components:   B Natriuretic Peptide 104.8 (*)    All other components within normal limits  URINALYSIS, ROUTINE W REFLEX MICROSCOPIC - Abnormal; Notable for the following components:   APPearance CLOUDY (*)    Glucose, UA >=500 (*)    Hgb urine dipstick SMALL (*)    Leukocytes,Ua LARGE (*)    Bacteria, UA MANY (*)    All other components within normal limits  AMMONIA - Abnormal; Notable for the following components:   Ammonia 60 (*)    All other components within normal limits  I-STAT VENOUS BLOOD GAS, ED - Abnormal; Notable for the following components:   pH, Ven 7.532 (*)    pO2, Ven 200 (*)    Bicarbonate 43.5 (*)    TCO2 45 (*)    Acid-Base Excess 18.0 (*)    Calcium , Ion 0.92 (*)    All other components within normal limits  RESP PANEL BY RT-PCR (RSV, FLU A&B, COVID)  RVPGX2  EXPECTORATED SPUTUM ASSESSMENT W GRAM STAIN, RFLX TO RESP C  URINE CULTURE  PROCALCITONIN  I-STAT CG4 LACTIC ACID, ED  I-STAT CG4 LACTIC ACID, ED  I-STAT VENOUS BLOOD GAS, ED  I-STAT VENOUS BLOOD GAS, ED  TROPONIN I (HIGH SENSITIVITY)  TROPONIN I (HIGH SENSITIVITY)    Notable for as above  EKG   EKG Interpretation Date/Time:    Ventricular Rate:    PR Interval:    QRS Duration:    QT Interval:    QTC Calculation:   R Axis:      Text Interpretation:           Imaging Studies ordered: I ordered imaging studies including CTPE CXR I independently visualized the following imaging with scope of  interpretation limited to determining acute life threatening conditions related to emergency care; findings noted above I agree with the radiologist interpretation If any imaging was obtained with contrast I closely monitored patient for any possible adverse reaction a/w contrast administration in the emergency department   Medicines ordered and prescription drug management: Meds ordered this encounter  Medications   furosemide  (LASIX ) injection 80 mg   methylPREDNISolone  sodium succinate (SOLU-MEDROL ) 125 mg/2 mL injection 125 mg   albuterol  (PROVENTIL ) (2.5 MG/3ML) 0.083% nebulizer solution 10 mg   iohexol  (OMNIPAQUE ) 350 MG/ML injection 75 mL   cefTRIAXone  (ROCEPHIN ) 2 g in sodium chloride  0.9 % 100 mL IVPB    Antibiotic Indication::   CAP   azithromycin  (ZITHROMAX ) 500 mg in sodium chloride  0.9 % 250 mL IVPB    Antibiotic Indication::   CAP   atorvastatin  (LIPITOR) tablet 20 mg   DISCONTD: furosemide  (LASIX ) tablet 40 mg    Patient taking differently: Give 1 tablet (40mg ) by mouth twice daily and give 1/2 tablet (20mg ) by mouth in the afternoon for weight gain in CHF for 8 days.     metoprolol  succinate (TOPROL -XL) 24 hr tablet 12.5 mg   levothyroxine  (SYNTHROID ) tablet 200 mcg   apixaban  (ELIQUIS ) tablet 5 mg   budesonide  (PULMICORT ) nebulizer solution 0.25 mg   furosemide  (LASIX ) injection 60 mg   DISCONTD: methylPREDNISolone  sodium succinate (SOLU-MEDROL ) 125 mg/2 mL injection 125 mg    IV methylprednisolone  will be converted  to either a q12h or q24h frequency with the same total daily dose (TDD).  Ordered Dose: 1 to 125 mg TDD; convert to: TDD q24h.  Ordered Dose: 126 to 250 mg TDD; convert to: TDD div q12h.  Ordered Dose: >250 mg TDD; DAW.   DISCONTD: predniSONE  (DELTASONE ) tablet 40 mg   ipratropium (ATROVENT ) nebulizer solution 0.5 mg   albuterol  (PROVENTIL ) (2.5 MG/3ML) 0.083% nebulizer solution 2.5 mg   sodium chloride  flush (NS) 0.9 % injection 3 mL   OR Linked Order  Group    acetaminophen  (TYLENOL ) tablet 650 mg    acetaminophen  (TYLENOL ) suppository 650 mg   polyethylene glycol (MIRALAX  / GLYCOLAX ) packet 17 g   predniSONE  (DELTASONE ) tablet 50 mg   morphine  (PF) 4 MG/ML injection 4 mg   ondansetron  (ZOFRAN ) injection 4 mg    -I have reviewed the patients home medicines and have made adjustments as needed   Consultations Obtained: I requested consultation with the hospitalist,  and discussed lab and imaging findings as well as pertinent plan    Cardiac Monitoring: The patient was maintained on a cardiac monitor.  I personally viewed and interpreted the cardiac monitored which showed an underlying rhythm of: sinus tachy Continuous pulse oximetry interpreted by myself, 93% on BiPAP.    Social Determinants of Health:  Diagnosis or treatment significantly limited by social determinants of health: current smoker and obesity   Reevaluation: After the interventions noted above, I reevaluated the patient and found that they have improved  Co morbidities that complicate the patient evaluation  Past Medical History:  Diagnosis Date   Acute metabolic encephalopathy 12/30/2019   Acute on chronic congestive heart failure (HCC) 08/09/2023   Acute on chronic hypoxic respiratory failure (HCC) 05/23/2022   Anxiety    Atrial fibrillation with rapid ventricular response (HCC) 02/11/2020   Bronchitis    COPD (chronic obstructive pulmonary disease) (HCC)    Depression    Diabetes mellitus, type II (HCC)    Dyspnea    Enterococcal bacteremia 08/13/2023   GERD (gastroesophageal reflux disease)    Hyperlipidemia    Hypertension    Hypothyroidism    Neuropathy    Ovarian cancer (HCC)    Pneumonia due to COVID-19 virus 01/31/2020   Shingles    Shingles outbreak 06/21/2015   Streptococcal bacteremia 08/14/2023   Thyroid  disease       Dispostion: Disposition decision including need for hospitalization was considered, and patient admitted to the  hospital.    Final Clinical Impression(s) / ED Diagnoses Final diagnoses:  Acute respiratory failure with hypoxia (HCC)  COPD exacerbation (HCC)  Community acquired pneumonia, unspecified laterality  Acute cystitis with hematuria        Elnor Jayson LABOR, DO 01/12/24 1610

## 2024-01-12 NOTE — Progress Notes (Signed)
 Responded to page to support patient who can to ED as respiratory distress. Pt being intubated Chaplain provided support to patient and staff.  Chaplain available as needed.  Rayleen Dade, [place], [facility], Pager [phone]

## 2024-01-12 NOTE — Progress Notes (Signed)
 Patient was transported to CT & back to trauma C on BIPAP without any complications.

## 2024-01-12 NOTE — ED Notes (Signed)
 Patient transported to CT

## 2024-01-12 NOTE — ED Triage Notes (Signed)
 Pt BIB [facility] from [facility] for lethargy/resp distress.  Arrived on CPAP with EMS.  Pt was found to be slow to respond, at facility, 76% on 4L, diminished lung sounds.  PT has hx of intubation.  Also hx of CHF & CKD, swelling in BL all extremities   134/84 97% CPAP, HR 82, CBG 178. .    Duoneb given. Facility increased her Lasix  dose this week.

## 2024-01-12 NOTE — ED Provider Notes (Signed)
.  Ultrasound ED Peripheral IV (Provider)  Date/Time: 01/12/2024 11:11 AM  Performed by: Neldon Hamp RAMAN, PA Authorized by: Neldon Hamp RAMAN, PA   Procedure details:    Indications: multiple failed IV attempts     Skin Prep: chlorhexidine  gluconate     Location:  Left AC   Angiocath:  20 G   Bedside Ultrasound Guided: Yes     Images: not archived     Patient tolerated procedure without complications: Yes     Dressing applied: Yes   Comments:     Due to emergent nature of this case I did not archive US  images     Neldon Hamp RAMAN, PA 01/12/24 1111    Elnor Jayson LABOR, DO 01/12/24 1610

## 2024-01-12 NOTE — ED Notes (Signed)
 PT noted to have an 02 sat in the 70's consistently with a good waveform.  Respiratory contacted.  They instructed this RN to increase 02 on vent to 100%.  Oxygen saturation has increased to 98%

## 2024-01-12 NOTE — H&P (Signed)
 History and Physical   [name] Boone:[id number] DOB: July 06, 1950 DOA: 01/12/2024  PCP: Glenda Boone LABOR, MD   Patient coming from: SNF  Chief Complaint: Lethargy, respiratory distress  HPI: Glenda Boone is a 73 y.o. female with medical history significant of hypertension, hyperlipidemia, diabetes, TIA, atrial fibrillation, hypothyroidism, chronic diastolic CHF, anxiety, depression, obesity, chronic respiratory failure with hypoxia on 2-4 L, chronic bronchitis, COPD, neuropathy presenting with respiratory distress and lethargy from SNF.  Patient noted to be not acting herself, more lethargic and noted to be short of breath/developing respiratory distress.  EMS was called and on their arrival patient was saturating 70% on her 4 L.  Patient was placed on CPAP with minimal improvement and was brought to the ED.  Patient placed on BiPAP in the ED and has began to improve.  Patient unable to fully participate in review of systems due to BiPAP and some continued lethargy.  ED Course: Vital signs in the ED notable for blood pressure in the 110s-150 systolic, respiratory rate in the 20s, requiring BiPAP to maintain saturations.  Lab workup included BMP with chloride 91, bicarb 36, BUN 26, creatinine stable at 1.47, glucose 172, calcium  8.3.  CBC without leukocytosis, hemoglobin stable to mildly above baseline 11.2.  BNP elevated at 104.  Troponin negative with repeat pending.  Lactic acid negative x 2.  Respiratory panel for flu COVID and RSV pending.  Ammonia level mildly elevated at 60.  Urinalysis with glucose, hemoglobin, leukocytes, bacteria.  Urine culture pending.  Expectorated sputum culture pending.  VBG with pH 7.53 but normal pCO2, repeat VBG on BiPAP is pending.  Chest x-ray showed stable cardiomegaly with central pulmonary venous congestion.  CTA PE study showed limited exam that was essentially nondiagnostic for PE.  Noted to have right greater than left lower lobe airspace disease  possibly secondary to atelectasis but unable to exclude right basilar infection.  Changes consistent with pulmonary hypertension.  Patient received ceftriaxone , azithromycin , Solu-Medrol , albuterol , Lasix , BiPAP in the ED.  Review of Systems: Patient unable to fully participate in review of systems due to BiPAP and some continued lethargy.  Past Medical History:  Diagnosis Date   Acute metabolic encephalopathy 12/30/2019   Acute on chronic congestive heart failure (HCC) 08/09/2023   Acute on chronic hypoxic respiratory failure (HCC) 05/23/2022   Anxiety    Atrial fibrillation with rapid ventricular response (HCC) 02/11/2020   Bronchitis    COPD (chronic obstructive pulmonary disease) (HCC)    Depression    Diabetes mellitus, type II (HCC)    Dyspnea    Enterococcal bacteremia 08/13/2023   GERD (gastroesophageal reflux disease)    Hyperlipidemia    Hypertension    Hypothyroidism    Neuropathy    Ovarian cancer (HCC)    Pneumonia due to COVID-19 virus 01/31/2020   Shingles    Shingles outbreak 06/21/2015   Streptococcal bacteremia 08/14/2023   Thyroid  disease     Past Surgical History:  Procedure Laterality Date   ABDOMINAL HYSTERECTOMY     ANKLE SURGERY Left    CHOLECYSTECTOMY     OVARY SURGERY     SMALL INTESTINE SURGERY     TONSILLECTOMY      Social History  reports that she has been smoking cigarettes. She started smoking about 44 years ago. She has a 44.1 pack-year smoking history. She has never used smokeless tobacco. She reports that she does not drink alcohol  and does not use drugs.  Allergies  Allergen Reactions  Codeine Nausea Only   Shellfish Allergy Hives   Demerol [Meperidine Hcl] Anxiety   Neurontin  [Gabapentin ] Other (See Comments)    Nightmares    Family History  Problem Relation Age of Onset   Heart Problems Mother    Hypertension Mother    Colon cancer Father    Arthritis-Osteo Sister   Reviewed on admission  Prior to Admission  medications   Medication Sig Start Date End Date Taking? Authorizing Provider  acetaminophen  (TYLENOL ) 325 MG tablet Take 650 mg by mouth every 6 (six) hours as needed (general discomfort, fever >/= 101F).   Yes [provider]  albuterol  (VENTOLIN  HFA) 108 (90 Base) MCG/ACT inhaler Inhale 2 puffs into the lungs every 6 (six) hours as needed for wheezing or shortness of breath. 01/01/20  Yes Gonfa, Taye T, MD  apixaban  (ELIQUIS ) 5 MG TABS tablet Take 1 tablet (5 mg total) by mouth 2 (two) times daily. 02/16/20  Yes Awanda [place], MD  atorvastatin  (LIPITOR) 20 MG tablet Take 1 tablet (20 mg total) by mouth daily. Patient taking differently: Take 20 mg by mouth at bedtime. 05/31/22  Yes Nooruddin, Saad, MD  barrier cream (NON-SPECIFIED) CREA Apply 1 Application topically See admin instructions. Apply to labia and groin area every day and night shift and apply three times daily with every shift with all incontinent care.   Yes [provider]  budesonide  (PULMICORT ) 0.25 MG/2ML nebulizer solution Take 2 mLs (0.25 mg total) by nebulization 2 (two) times daily. 08/17/23  Yes Will Almarie MATSU, MD  buPROPion  (WELLBUTRIN ) 75 MG tablet Take 75 mg by mouth daily.   Yes [provider]  busPIRone  (BUSPAR ) 7.5 MG tablet Take 7.5 mg by mouth 2 (two) times daily.   Yes [provider]  cyclobenzaprine  (FLEXERIL ) 5 MG tablet Take 5 mg by mouth daily.   Yes [provider]  dapagliflozin  propanediol (FARXIGA ) 10 MG TABS tablet Take 1 tablet (10 mg total) by mouth daily. 05/31/22  Yes Nooruddin, Saad, MD  furosemide  (LASIX ) 40 MG tablet Take 1 tablet (40 mg total) by mouth 2 (two) times daily. Patient taking differently: Take 20-40 mg by mouth See admin instructions. Give 1 tablet (40mg ) by mouth twice daily and give 1/2 tablet (20mg ) by mouth in the afternoon for weight gain in CHF for 8 days. 08/17/23  Yes Will Almarie MATSU, MD  levothyroxine  (SYNTHROID ) 200 MCG tablet Take 200  mcg by mouth daily.   Yes [provider]  lidocaine  4 % Place 1 patch onto the skin daily.   Yes [provider]  linagliptin  (TRADJENTA ) 5 MG TABS tablet Take 5 mg by mouth daily.   Yes [provider]  metoprolol  succinate (TOPROL -XL) 25 MG 24 hr tablet Take 0.5 tablets (12.5 mg total) by mouth daily. 08/18/23  Yes Will Almarie MATSU, MD  nystatin  cream (MYCOSTATIN ) Apply 1 Application topically 2 (two) times daily.   Yes [provider]  OXYGEN Inhale 2 L/min into the lungs continuous.   Yes [provider]  polyethylene glycol powder (MIRALAX ) 17 GM/SCOOP powder Take 17 g by mouth daily as needed (constipation).   Yes [provider]  tetrahydrozoline 0.05 % ophthalmic solution Place 2 drops into both eyes daily.   Yes [provider]  tolterodine (DETROL) 1 MG tablet Take 0.5 mg by mouth daily.   Yes [provider]  umeclidinium-vilanterol (ANORO ELLIPTA) 62.5-25 MCG/ACT AEPB Inhale 1 puff into the lungs daily.   Yes [provider]  Vibegron (GEMTESA) 75 MG TABS Take 75 mg by mouth daily.   Yes [provider]  Zinc Oxide 20 % PSTE Apply 1 application  topically every 6 (six) hours as needed (vaginal irritation). unsupervised self-administration   Yes [provider]    Physical Exam: Vitals:   01/12/24 1030 01/12/24 1045 01/12/24 1100 01/12/24 1209  BP: 139/85 (!) 145/101 (!) 152/81 (!) 140/78  Pulse: 78 79 72 78  Resp: 17 20 15  (!) 24  SpO2: (!) 89% 99% 100% 95%    Physical Exam Constitutional:      General: She is not in acute distress.    Appearance: Normal appearance. She is obese.  HENT:     Head: Normocephalic and atraumatic.     Mouth/Throat:     Mouth: Mucous membranes are moist.     Pharynx: Oropharynx is clear.  Eyes:     Extraocular Movements: Extraocular movements intact.     Pupils: Pupils are equal, round, and reactive to light.  Cardiovascular:     Rate and  Rhythm: Normal rate and regular rhythm.     Pulses: Normal pulses.     Heart sounds: Normal heart sounds.  Pulmonary:     Effort: Pulmonary effort is normal. No respiratory distress.     Breath sounds: Wheezing and rales present.     Comments: BiPAP, distant breath sounds Abdominal:     General: Bowel sounds are normal. There is no distension.     Palpations: Abdomen is soft.     Tenderness: There is no abdominal tenderness.  Musculoskeletal:        General: No swelling or deformity.     Right lower leg: Edema present.     Left lower leg: Edema present.  Skin:    General: Skin is warm and dry.  Neurological:     General: No focal deficit present.     Mental Status: Mental status is at baseline.    Labs on Admission: I have personally reviewed following labs and imaging studies  CBC: Recent Labs  Lab 01/12/24 1023 01/12/24 1036  WBC 8.0  --   NEUTROABS 6.1  --   HGB 11.2* 12.9  HCT 40.8 38.0  MCV 108.8*  --   PLT 221  --     Basic Metabolic Panel: Recent Labs  Lab 01/12/24 1023 01/12/24 1036  NA 140 138  K 5.0 4.7  CL 91*  --   CO2 36*  --   GLUCOSE 172*  --   BUN 26*  --   CREATININE 1.47*  --   CALCIUM  8.3*  --     GFR: CrCl cannot be calculated (Unknown ideal weight.).  Liver Function Tests: No results for input(s): AST, ALT, ALKPHOS, BILITOT, PROT, ALBUMIN in the last 168 hours.  Urine analysis:    Component Value Date/Time   COLORURINE YELLOW 01/12/2024 1252   APPEARANCEUR CLOUDY (A) 01/12/2024 1252   LABSPEC 1.010 01/12/2024 1252   PHURINE 5.0 01/12/2024 1252   GLUCOSEU >=500 (A) 01/12/2024 1252   HGBUR SMALL (A) 01/12/2024 1252   BILIRUBINUR NEGATIVE 01/12/2024 1252   KETONESUR NEGATIVE 01/12/2024 1252   PROTEINUR NEGATIVE 01/12/2024 1252   NITRITE NEGATIVE 01/12/2024 1252   LEUKOCYTESUR LARGE (A) 01/12/2024 1252    Radiological Exams on Admission: CT Angio Chest PE W and/or Wo Contrast Result Date: 01/12/2024 EXAM: CTA  CHEST AORTA 01/12/2024 12:35:50 PM TECHNIQUE: CTA of the chest was performed after the administration of intravenous contrast. Multiplanar reformatted images are  provided for review. MIP images are provided for review. Automated exposure control, iterative reconstruction, and/or weight based adjustment of the mA/kV was utilized to reduce the radiation dose to as low as reasonably achievable. COMPARISON: CT dated 09/10/2010. CLINICAL HISTORY: Pulmonary embolism (PE) suspected, high prob. Triage notes; Pt BIB [facility] from [facility] for lethargy/resp distress. Arrived on CPAP with EMS. Pt was found to be slow to respond, at facility, 76% on 4L, diminished lung sounds. PT has hx of intubation. Also hx of CHF \\T \ CKD, swelling in BL all extremities; 34/84 97% CPAP, HR 82, CBG 178. Duoneb given. Facility increased her Lasix  dose this week. FINDINGS: AORTA: Aortic atherosclerosis (ICD10-I70.0). MEDIASTINUM: Moderate cardiomegaly. 3 vessel Coronary artery atherosclerosis. LYMPH NODES: Extremely limited evaluation for thoracic adenopathy. LUNGS AND PLEURA: Right greater than left base dependent air space disease. No large pleural effusion. Tracheobronchial narrowing is likely related to expiratory imaging and a component of tracheomalacia. Right greater than left lower lobe dependent airspace disease. No saddle type pulmonary embolism, no lobar embolism to the right lung base. Pulmonary artery enlargement suggests pulmonary arterial hypertension. UPPER ABDOMEN: Hepatic steatosis with mild caudate lobe enlargement. An incompletely imaged right adrenal mass measures 3.3 x 3.1 cm on image 135 of series 5, similar to 09/10/2010. SOFT TISSUES AND BONES: No acute bone or soft tissue abnormality. ARTIFACTS/LIMITATIONS: Severely degraded, nearly nondiagnostic evaluation secondary to patient body habitus and position, as well as motion. IMPRESSION: 1. Severely limited, essentially nondiagnostic exam for evaluation of pulmonary  emboli beyond the outflow tract. 2. Right greater than left lower lobe dependent airspace disease, possibly atelectasis. At the right lung base, Infection or aspiration cannot be excluded. 3. Pulmonary artery enlargement suggests pulmonary arterial hypertension. 4. Moderate cardiomegaly. Aortic atherosclerosis (icd10-i70.0). Coronary artery atherosclerosis. 5. Hepatic steatosis with caudate lobe enlargement. Cannot exclude mild cirrhosis. 6. Right adrenal mass, similar to prior study from 09/10/10, presumed benign. Electronically signed by: Rockey Kilts MD 01/12/2024 01:14 PM EDT RP Workstation: [id number]   DG Chest Port 1 View Result Date: 01/12/2024 CLINICAL DATA:  Respiratory distress. EXAM: PORTABLE CHEST 1 VIEW COMPARISON:  August 14, 2023. FINDINGS: Stable cardiomegaly with mild central pulmonary vascular congestion. Hypoinflation of the lungs is noted with elevated right hemidiaphragm. Minimal pulmonary edema may be present. Bony thorax is unremarkable. IMPRESSION: Stable cardiomegaly with mild central pulmonary vascular congestion. Minimal pulmonary edema may be present. Electronically Signed   By: Lynwood Landy Raddle M.D.   On: 01/12/2024 11:29   EKG: Independently reviewed.  Sinus rhythm at 73 bpm.  Nonspecific T wave changes.  Baseline wander multiple leads.  Low voltage multiple leads.  Assessment/Plan Active Problems:   Anxiety, generalized   Neuropathy   History of TIA (transient ischemic attack)   Essential hypertension   Obesity, Class III, BMI 40-49.9 (morbid obesity)   Type 2 diabetes mellitus with hyperlipidemia (HCC)   Hypothyroidism   COPD exacerbation (HCC)   Chronic atrial fibrillation (HCC)    Acute chronic respiratory failure with hypoxia Acute exacerbation of COPD Acute on chronic diastolic CHF > Patient presented in respiratory distress with lethargy and hypoxia from SNF. > Saturating 70% on 4 L at facility, placed on CPAP by EMS with minimal improvement and placed on  BiPAP in the ED. > Patient noted to have wheezing and imaging unable to rule out infection so patient received Solu-Medrol , albuterol , ceftriaxone , azithromycin  for possible pneumonia/COPD exacerbation. > Patient also noted to be volume overloaded on exam and received dose of Lasix .  BNP 104  but likely falsely low in the setting of obesity.  Imaging with evidence of fluid and edema on exam. > Suspect respiratory failure is multifactorial.  Improving on BiPAP, will continue with treatment for COPD exacerbation and volume overload/CHF exacerbation. - Monitor on progressive unit overnight - Continue with BiPAP, wean as tolerated For COPD exacerbation/?Pneumonia - Daily steroids - Scheduled Atrovent  - As needed albuterol  - Follow-up RVP - Procalcitonin - Follow-up sputum cultures For CHF exacerbation - Has had recent echo - Strict I's and O's, daily weights - Continue IV Lasix  twice daily - Check magnesium  - Trend renal function and electrolytes  Hypertension - Plan to resume metoprolol  tomorrow  Hyperlipidemia - Resume atorvastatin  tomorrow  Hypothyroidism - Continue home Synthroid   Atrial fibrillation - Restart metoprolol  tomorrow - Continue home Eliquis  when able  History of TIA - Continue atorvastatin  and Eliquis  when able  Anxiety Depression - Resume bupropion  when able  Obesity - Noted  DVT prophylaxis: Eliquis  Code Status:   DNR Family Communication:  Updated at bedside  Disposition Plan:   Patient is from:  SNF  Anticipated DC to:  Same as above  Anticipated DC date:  2 to 4 days  Anticipated DC barriers: None  Consults called:  None Admission status:  Inpatient, progressive  Severity of Illness: The appropriate patient status for this patient is INPATIENT. Inpatient status is judged to be reasonable and necessary in order to provide the required intensity of service to ensure the patient's safety. The patient's presenting symptoms, physical exam findings,  and initial radiographic and laboratory data in the context of their chronic comorbidities is felt to place them at high risk for further clinical deterioration. Furthermore, it is not anticipated that the patient will be medically stable for discharge from the hospital within 2 midnights of admission.   * I certify that at the point of admission it is my clinical judgment that the patient will require inpatient hospital care spanning beyond 2 midnights from the point of admission due to high intensity of service, high risk for further deterioration and high frequency of surveillance required.DEWAINE Marsa KATHEE Seena MD [facility]  How to contact the [facility] Attending or Consulting provider 7A - 7P or covering provider during after hours 7P -7A, for this patient?   Check the care team in [facility] and look for a) attending/consulting [facility] provider listed and b) the [facility] team listed Log into [email or web address] and use [place]'s universal password to access. If you do not have the password, please contact the hospital operator. Locate the [facility] provider you are looking for under [facility] and page to a number that you can be directly reached. If you still have difficulty reaching the provider, please page the [facility] (Director on Call) for the Hospitalists listed on amion for assistance.  01/12/2024, 2:11 PM

## 2024-01-12 NOTE — ED Notes (Signed)
 CCMD called.

## 2024-01-13 DIAGNOSIS — G9341 Metabolic encephalopathy: Secondary | ICD-10-CM | POA: Diagnosis not present

## 2024-01-13 DIAGNOSIS — J9622 Acute and chronic respiratory failure with hypercapnia: Secondary | ICD-10-CM | POA: Diagnosis not present

## 2024-01-13 DIAGNOSIS — J441 Chronic obstructive pulmonary disease with (acute) exacerbation: Secondary | ICD-10-CM | POA: Diagnosis not present

## 2024-01-13 DIAGNOSIS — J9621 Acute and chronic respiratory failure with hypoxia: Secondary | ICD-10-CM | POA: Diagnosis not present

## 2024-01-13 LAB — BLOOD GAS, VENOUS
Acid-Base Excess: 18.9 mmol/L — ABNORMAL HIGH (ref 0.0–2.0)
Bicarbonate: 49.2 mmol/L — ABNORMAL HIGH (ref 20.0–28.0)
Drawn by: 59174
O2 Saturation: 65.9 %
Patient temperature: 37
pCO2, Ven: 100 mmHg (ref 44–60)
pH, Ven: 7.3 (ref 7.25–7.43)
pO2, Ven: 38 mmHg (ref 32–45)

## 2024-01-13 LAB — COMPREHENSIVE METABOLIC PANEL WITH GFR
ALT: 13 U/L (ref 0–44)
AST: 22 U/L (ref 15–41)
Albumin: 3 g/dL — ABNORMAL LOW (ref 3.5–5.0)
Alkaline Phosphatase: 86 U/L (ref 38–126)
Anion gap: 10 (ref 5–15)
BUN: 30 mg/dL — ABNORMAL HIGH (ref 8–23)
CO2: 43 mmol/L — ABNORMAL HIGH (ref 22–32)
Calcium: 8 mg/dL — ABNORMAL LOW (ref 8.9–10.3)
Chloride: 91 mmol/L — ABNORMAL LOW (ref 98–111)
Creatinine, Ser: 1.56 mg/dL — ABNORMAL HIGH (ref 0.44–1.00)
GFR, Estimated: 35 mL/min — ABNORMAL LOW (ref >60)
Glucose, Bld: 216 mg/dL — ABNORMAL HIGH (ref 70–99)
Potassium: 5.7 mmol/L — ABNORMAL HIGH (ref 3.5–5.1)
Sodium: 144 mmol/L (ref 135–145)
Total Bilirubin: 0.7 mg/dL (ref 0.0–1.2)
Total Protein: 7.2 g/dL (ref 6.5–8.1)

## 2024-01-13 LAB — BLOOD GAS, ARTERIAL
Acid-Base Excess: 17.2 mmol/L — ABNORMAL HIGH (ref 0.0–2.0)
Bicarbonate: 49.6 mmol/L — ABNORMAL HIGH (ref 20.0–28.0)
O2 Saturation: 96.8 %
Patient temperature: 37
pCO2 arterial: 108 mmHg (ref 32–48)
pH, Arterial: 7.27 — ABNORMAL LOW (ref 7.35–7.45)
pO2, Arterial: 81 mmHg — ABNORMAL LOW (ref 83–108)

## 2024-01-13 LAB — CBC
HCT: 37.1 % (ref 36.0–46.0)
Hemoglobin: 10.5 g/dL — ABNORMAL LOW (ref 12.0–15.0)
MCH: 30.3 pg (ref 26.0–34.0)
MCHC: 28.3 g/dL — ABNORMAL LOW (ref 30.0–36.0)
MCV: 106.9 fL — ABNORMAL HIGH (ref 80.0–100.0)
Platelets: 193 K/uL (ref 150–400)
RBC: 3.47 MIL/uL — ABNORMAL LOW (ref 3.87–5.11)
RDW: 16.3 % — ABNORMAL HIGH (ref 11.5–15.5)
WBC: 11.3 K/uL — ABNORMAL HIGH (ref 4.0–10.5)
nRBC: 0.2 % (ref 0.0–0.2)

## 2024-01-13 LAB — RESPIRATORY PANEL BY PCR

## 2024-01-13 LAB — BRAIN NATRIURETIC PEPTIDE: B Natriuretic Peptide: 132.7 pg/mL — ABNORMAL HIGH (ref 0.0–100.0)

## 2024-01-13 LAB — GLUCOSE, CAPILLARY: Glucose-Capillary: 207 mg/dL — ABNORMAL HIGH (ref 70–99)

## 2024-01-13 LAB — POTASSIUM
Potassium: 5.1 mmol/L (ref 3.5–5.1)
Potassium: 4.6 mmol/L (ref 3.5–5.1)

## 2024-01-13 MED ORDER — METHYLPREDNISOLONE SODIUM SUCC 125 MG IJ SOLR
60.0000 mg | Freq: Every day | INTRAMUSCULAR | Status: DC
  Administered 2024-01-14 – 2024-01-17 (×4): 60 mg via INTRAVENOUS
  Filled 2024-01-13 (×4): qty 2

## 2024-01-13 MED ORDER — METHYLPREDNISOLONE SODIUM SUCC 125 MG IJ SOLR
125.0000 mg | Freq: Every day | INTRAMUSCULAR | Status: DC
  Administered 2024-01-13: 125 mg via INTRAVENOUS
  Filled 2024-01-13: qty 2

## 2024-01-13 MED ORDER — PANTOPRAZOLE SODIUM 40 MG IV SOLR
40.0000 mg | INTRAVENOUS | Status: DC
  Administered 2024-01-13 – 2024-01-16 (×4): 40 mg via INTRAVENOUS
  Filled 2024-01-13 (×4): qty 10

## 2024-01-13 MED ORDER — ACETAZOLAMIDE SODIUM 500 MG IJ SOLR
500.0000 mg | Freq: Four times a day (QID) | INTRAMUSCULAR | Status: AC
  Administered 2024-01-13 – 2024-01-14 (×4): 500 mg via INTRAVENOUS
  Filled 2024-01-13 (×4): qty 500

## 2024-01-13 MED ORDER — SODIUM CHLORIDE 0.9 % IV SOLN
2.0000 g | INTRAVENOUS | Status: DC
  Administered 2024-01-13 – 2024-01-15 (×3): 2 g via INTRAVENOUS
  Filled 2024-01-13 (×4): qty 20

## 2024-01-13 MED ORDER — STERILE WATER FOR INJECTION IJ SOLN
INTRAMUSCULAR | Status: AC
  Administered 2024-01-13: 10 mL
  Filled 2024-01-13: qty 10

## 2024-01-13 MED ORDER — INSULIN ASPART 100 UNIT/ML IV SOLN
5.0000 [IU] | Freq: Once | INTRAVENOUS | Status: AC
  Administered 2024-01-13: 5 [IU] via INTRAVENOUS

## 2024-01-13 MED ORDER — METOPROLOL SUCCINATE ER 25 MG PO TB24
12.5000 mg | ORAL_TABLET | Freq: Every day | ORAL | Status: DC
  Administered 2024-01-15 – 2024-01-18 (×4): 12.5 mg via ORAL
  Filled 2024-01-13 (×5): qty 1

## 2024-01-13 NOTE — Progress Notes (Signed)
 PROGRESS NOTE    Glenda Boone  FMW:[id number] DOB: 09/05/1950 DOA: 01/12/2024 PCP: Feliciano Devoria LABOR, MD  Chief Complaint  Patient presents with   Respiratory Distress    Brief Narrative:   [name] is a 73 y.o. female with medical history significant of hypertension, hyperlipidemia, diabetes, TIA, atrial fibrillation, hypothyroidism, chronic diastolic CHF, anxiety, depression, obesity, chronic respiratory failure with hypoxia on 2-4 L, chronic bronchitis, COPD, neuropathy presenting with respiratory distress and lethargy from SNF.  Workup significant for respiratory distress and hypercapnia where she was started on BiPAP.   Assessment & Plan:   Principal Problem:   Acute on chronic respiratory failure with hypoxia (HCC) Active Problems:   Anxiety, generalized   Neuropathy   History of TIA (transient ischemic attack)   Essential hypertension   Obesity, Class III, BMI 40-49.9 (morbid obesity)   Type 2 diabetes mellitus with hyperlipidemia (HCC)   Hypothyroidism   COPD exacerbation (HCC)   Chronic atrial fibrillation (HCC)  Acute on chronic respiratory failure with hypoxia and hypercapnia  Acute exacerbation of COPD Acute on chronic diastolic CHF -Patient with significant respiratory distress, increased work of breathing and lethargy secondary to CO2 retention -She is on BiPAP, vent setting adjusted PCCM input greatly appreciated. -Continue with IV steroids, continue scheduled bronchodilators. -Check respiratory viral panel, check respiratory cultures -COVID/RSV/flu  is negative - Chest FiO2 to keep oxygen saturation between 90 and 93% - Hold Diamox . -Continue with IV diuresis. -high risk for intubation, PCCM consulted  Acute encephalopathy due to hypercapnia  UTI -Continue with Rocephin    Hypertension - Continue with metoprolol    Hyperlipidemia - Resume atorvastatin  stable   Hypothyroidism - Continue home Synthroid    Atrial fibrillation - Continue  with metoprolol  - Continue home Eliquis     History of TIA - Continue atorvastatin  and Eliquis  when able   Anxiety Depression - Resume bupropion  when able   Morbid obesity - Body mass index is 59.03 kg/m.  CKD stage IIIb - Monitor closely as diuresis  Hyperkalemia - Recheck potassium level, will hold on Lokelma for now as on aggressive IV diuresis  Macrocytic anemia B12 deficiency - Low B12 level noted in March, will start on supplement  DVT prophylaxis: Eliquis  Code Status: limited. Family Communication: Unable to reach any family members on the available phone numbers including sister and niece  Disposition:   Status is: Inpatient    Consultants:  PCCM    The patient is critically ill with multi-organ failure.  Critical care was necessary to treat or prevent imminent or life-threatening deterioration of  acute respiratory failure, acute cardiac failure, renal failure and was exclusive of separately billable procedures and treating other patients. Total critical care time spent by me: 40 minutes Time spent personally by me on obtaining history from patient or surrogate, evaluation of the patient, evaluation of patient's response to treatment, ordering and review of laboratory studies, ordering and review of radiographic studies, ordering and performing treatments and interventions, and re-evaluation of the patient's condition. Subjective:  Patient remained on BiPAP overnight with frequent adjustment, she is confused this morning  Objective: Vitals:   01/13/24 0429 01/13/24 0500 01/13/24 0617 01/13/24 0734  BP:  (!) 138/102  (!) 144/91  Pulse:  88  85  Resp:  (!) 24  (!) 30  Temp:    98.6 F (37 C)  TempSrc:    Axillary  SpO2: 93% 95% 96% 92%  Weight:  (!) 156 kg      Intake/Output Summary (Last 24  hours) at 01/13/2024 0948 Last data filed at 01/12/2024 1832 Gross per 24 hour  Intake --  Output 400 ml  Net -400 ml   Filed Weights   01/13/24 0500   Weight: (!) 156 kg    Examination:  [place]ley ill-appearing, awake, oriented to self, confused  Diminished air entry bilaterally Regular rate and rhythm, no rubs or gallops  Obese, bowel sounds present  Extremity with generalized edema    Data Reviewed: I have personally reviewed following labs and imaging studies  CBC: Recent Labs  Lab 01/12/24 1023 01/12/24 1036 01/12/24 1622 01/13/24 0435  WBC 8.0  --   --  11.3*  NEUTROABS 6.1  --   --   --   HGB 11.2* 12.9 11.9* 10.5*  HCT 40.8 38.0 35.0* 37.1  MCV 108.8*  --   --  106.9*  PLT 221  --   --  193    Basic Metabolic Panel: Recent Labs  Lab 01/12/24 1023 01/12/24 1036 01/12/24 1622 01/13/24 0435  NA 140 138 141 144  K 5.0 4.7 4.3 5.7*  CL 91*  --   --  91*  CO2 36*  --   --  43*  GLUCOSE 172*  --   --  216*  BUN 26*  --   --  30*  CREATININE 1.47*  --   --  1.56*  CALCIUM  8.3*  --   --  8.0*    GFR: Estimated Creatinine Clearance: 47.5 mL/min (A) (by C-G formula based on SCr of 1.56 mg/dL (H)).  Liver Function Tests: Recent Labs  Lab 01/13/24 0435  AST 22  ALT 13  ALKPHOS 86  BILITOT 0.7  PROT 7.2  ALBUMIN 3.0*    CBG: Recent Labs  Lab 01/13/24 0456  GLUCAP 207*     Recent Results (from the past 240 hours)  Resp panel by RT-PCR (RSV, Flu A&B, Covid) Anterior Nasal Swab     Status: None   Collection Time: 01/12/24 12:52 PM   Specimen: Anterior Nasal Swab  Result Value Ref Range Status   SARS Coronavirus 2 by RT PCR NEGATIVE NEGATIVE Final   Influenza A by PCR NEGATIVE NEGATIVE Final   Influenza B by PCR NEGATIVE NEGATIVE Final    Comment: (NOTE) The Xpert Xpress SARS-CoV-2/FLU/RSV plus assay is intended as an aid in the diagnosis of influenza from Nasopharyngeal swab specimens and should not be used as a sole basis for treatment. Nasal washings and aspirates are unacceptable for Xpert Xpress SARS-CoV-2/FLU/RSV testing.  Fact Sheet for  Patients: BloggerCourse.com  Fact Sheet for Healthcare Providers: SeriousBroker.it  This test is not yet approved or cleared by the [place]  FDA and has been authorized for detection and/or diagnosis of SARS-CoV-2 by FDA under an Emergency Use Authorization (EUA). This EUA will remain in effect (meaning this test can be used) for the duration of the COVID-19 declaration under Section 564(b)(1) of the Act, 21 U.S.C. section 360bbb-3(b)(1), unless the authorization is terminated or revoked.     Resp Syncytial Virus by PCR NEGATIVE NEGATIVE Final    Comment: (NOTE) Fact Sheet for Patients: BloggerCourse.com  Fact Sheet for Healthcare Providers: SeriousBroker.it  This test is not yet approved or cleared by the [place]  FDA and has been authorized for detection and/or diagnosis of SARS-CoV-2 by FDA under an Emergency Use Authorization (EUA). This EUA will remain in effect (meaning this test can be used) for the duration of the COVID-19 declaration under Section 564(b)(1) of the Act, 21  U.S.C. section 360bbb-3(b)(1), unless the authorization is terminated or revoked.  Performed at [facility], [address] [place], [place] [address]          Radiology Studies: CT Angio Chest PE W and/or Wo Contrast Result Date: 01/12/2024 EXAM: CTA CHEST AORTA 01/12/2024 12:35:50 PM TECHNIQUE: CTA of the chest was performed after the administration of intravenous contrast. Multiplanar reformatted images are provided for review. MIP images are provided for review. Automated exposure control, iterative reconstruction, and/or weight based adjustment of the mA/kV was utilized to reduce the radiation dose to as low as reasonably achievable. COMPARISON: CT dated 09/10/2010. CLINICAL HISTORY: Pulmonary embolism (PE) suspected, high prob. Triage notes; Pt BIB [facility] from [facility] for  lethargy/resp distress. Arrived on CPAP with EMS. Pt was found to be slow to respond, at facility, 76% on 4L, diminished lung sounds. PT has hx of intubation. Also hx of CHF \\T \ CKD, swelling in BL all extremities; 34/84 97% CPAP, HR 82, CBG 178. Duoneb given. Facility increased her Lasix  dose this week. FINDINGS: AORTA: Aortic atherosclerosis (ICD10-I70.0). MEDIASTINUM: Moderate cardiomegaly. 3 vessel Coronary artery atherosclerosis. LYMPH NODES: Extremely limited evaluation for thoracic adenopathy. LUNGS AND PLEURA: Right greater than left base dependent air space disease. No large pleural effusion. Tracheobronchial narrowing is likely related to expiratory imaging and a component of tracheomalacia. Right greater than left lower lobe dependent airspace disease. No saddle type pulmonary embolism, no lobar embolism to the right lung base. Pulmonary artery enlargement suggests pulmonary arterial hypertension. UPPER ABDOMEN: Hepatic steatosis with mild caudate lobe enlargement. An incompletely imaged right adrenal mass measures 3.3 x 3.1 cm on image 135 of series 5, similar to 09/10/2010. SOFT TISSUES AND BONES: No acute bone or soft tissue abnormality. ARTIFACTS/LIMITATIONS: Severely degraded, nearly nondiagnostic evaluation secondary to patient body habitus and position, as well as motion. IMPRESSION: 1. Severely limited, essentially nondiagnostic exam for evaluation of pulmonary emboli beyond the outflow tract. 2. Right greater than left lower lobe dependent airspace disease, possibly atelectasis. At the right lung base, Infection or aspiration cannot be excluded. 3. Pulmonary artery enlargement suggests pulmonary arterial hypertension. 4. Moderate cardiomegaly. Aortic atherosclerosis (icd10-i70.0). Coronary artery atherosclerosis. 5. Hepatic steatosis with caudate lobe enlargement. Cannot exclude mild cirrhosis. 6. Right adrenal mass, similar to prior study from 09/10/10, presumed benign. Electronically signed by:  Rockey Kilts MD 01/12/2024 01:14 PM EDT RP Workstation: [id number]   DG Chest Port 1 View Result Date: 01/12/2024 CLINICAL DATA:  Respiratory distress. EXAM: PORTABLE CHEST 1 VIEW COMPARISON:  August 14, 2023. FINDINGS: Stable cardiomegaly with mild central pulmonary vascular congestion. Hypoinflation of the lungs is noted with elevated right hemidiaphragm. Minimal pulmonary edema may be present. Bony thorax is unremarkable. IMPRESSION: Stable cardiomegaly with mild central pulmonary vascular congestion. Minimal pulmonary edema may be present. Electronically Signed   By: Lynwood Landy Raddle M.D.   On: 01/12/2024 11:29        Scheduled Meds:  acetaZOLAMIDE   500 mg Intravenous Q6H   apixaban   5 mg Oral BID   atorvastatin   20 mg Oral QHS   budesonide   0.25 mg Nebulization BID   furosemide   60 mg Intravenous BID   ipratropium  0.5 mg Nebulization Q6H   levothyroxine   200 mcg Oral Q0600   methylPREDNISolone  (SOLU-MEDROL ) injection  125 mg Intravenous Daily   metoprolol  succinate  12.5 mg Oral Daily   pantoprazole  (PROTONIX ) IV  40 mg Intravenous Q24H   sodium chloride  flush  3 mL Intravenous Q12H   Continuous  Infusions:  cefTRIAXone  (ROCEPHIN )  IV       LOS: 1 day    Brayton Lye, MD [facility]   To contact the attending provider between 7A-7P or the covering provider during after hours 7P-7A, please log into the web site [email or web address] and access using universal [place] password for that web site. If you do not have the password, please call the hospital operator.  01/13/2024, 9:48 AM

## 2024-01-13 NOTE — Progress Notes (Signed)
 I have discussed with sister at bedside, grandson Toribio, about her tenuous respiratory status, Discussed with them about her wishes if she ever wanted to be intubated and a life support, and at this point they wish to continue for current measure including noninvasive support, and all measures including antibiotics, diuresis, and aggressive management and monitoring, but they wish to keep her DNR, no CPR, and NO intubation/ventilation . Brayton Lye MD

## 2024-01-13 NOTE — Progress Notes (Signed)
 Episode of monoclonic seizure activity. Rapid response initiated. On-call MD aware. Labs ordered. Activity subsided with neuro assessment; awareness and alertness improved now with appropriate verbal response.

## 2024-01-13 NOTE — Plan of Care (Signed)
  Problem: Education: Goal: Knowledge of General Education information will improve Description: Including pain rating scale, medication(s)/side effects and non-pharmacologic comfort measures Outcome: Progressing   Problem: Health Behavior/Discharge Planning: Goal: Ability to manage health-related needs will improve Outcome: Progressing   Problem: Clinical Measurements: Goal: Ability to maintain clinical measurements within normal limits will improve Outcome: Progressing Goal: Will remain free from infection Outcome: Progressing Goal: Diagnostic test results will improve Outcome: Progressing Goal: Respiratory complications will improve Outcome: Progressing Goal: Cardiovascular complication will be avoided Outcome: Progressing   Problem: Activity: Goal: Risk for activity intolerance will decrease Outcome: Progressing   Problem: Nutrition: Goal: Adequate nutrition will be maintained Outcome: Progressing   Problem: Coping: Goal: Level of anxiety will decrease Outcome: Progressing   Problem: Elimination: Goal: Will not experience complications related to bowel motility Outcome: Progressing Goal: Will not experience complications related to urinary retention Outcome: Progressing   Problem: Pain Managment: Goal: General experience of comfort will improve and/or be controlled Outcome: Progressing   Problem: Safety: Goal: Ability to remain free from injury will improve Outcome: Progressing   Problem: Skin Integrity: Goal: Risk for impaired skin integrity will decrease Outcome: Progressing   Problem: Education: Goal: Ability to demonstrate management of disease process will improve Outcome: Progressing Goal: Ability to verbalize understanding of medication therapies will improve Outcome: Progressing Goal: Individualized Educational Video(s) Outcome: Progressing   Problem: Activity: Goal: Capacity to carry out activities will improve Outcome: Progressing    Problem: Cardiac: Goal: Ability to achieve and maintain adequate cardiopulmonary perfusion will improve Outcome: Progressing   Problem: Education: Goal: Knowledge of disease or condition will improve Outcome: Progressing Goal: Knowledge of the prescribed therapeutic regimen will improve Outcome: Progressing Goal: Individualized Educational Video(s) Outcome: Progressing   Problem: Activity: Goal: Ability to tolerate increased activity will improve Outcome: Progressing Goal: Will verbalize the importance of balancing activity with adequate rest periods Outcome: Progressing   Problem: Respiratory: Goal: Ability to maintain a clear airway will improve Outcome: Progressing Goal: Levels of oxygenation will improve Outcome: Progressing Goal: Ability to maintain adequate ventilation will improve Outcome: Progressing   Problem: Activity: Goal: Ability to tolerate increased activity will improve Outcome: Progressing   Problem: Clinical Measurements: Goal: Ability to maintain a body temperature in the normal range will improve Outcome: Progressing   Problem: Respiratory: Goal: Ability to maintain adequate ventilation will improve Outcome: Progressing Goal: Ability to maintain a clear airway will improve Outcome: Progressing

## 2024-01-13 NOTE — Significant Event (Addendum)
 Rapid Response Event Note   Reason for Call : Seizure like activity  Initial Focused Assessment:  I was notified by staff of pt with seizure like activity. Upon my arrival, pt is alert, tracking, with myoclonic twitching in all four extremities. Pt is responsive to noxious stimuli, communicates to the pain but not following commands. BBS Diminshed. Prior VBG at 1622 on 8/19 was 7.25/107/181/47.9. Suspect severe hypercarbia. Pt has been on NIPPV 12/6 since 1000 8/19. TV 300-400 on average. Dr. Keturah came to bedside.   0500-98.74F, HR 88 SR, 138/102 (110), RR 24 with sats 95% on 70% Fio2   Interventions:  -ABG (7.27/108/81/49.6) -Duoneb -NIPPV adjustments per RT       MD Notified: Dr. Keturah came to bedside Call Time: 0451 Arrival Time: 0455 End Time: 0540  Griselda Alm ORN, RN

## 2024-01-13 NOTE — Progress Notes (Signed)
 Heart Failure Navigator Progress Note  Assessed for Heart & Vascular TOC clinic readiness.  Patient does not meet criteria due to EF 70-75%, was last seen with [facility]. No HF TOC. .   Navigator will sign off at this time.   Stephane Haddock, BSN, Scientist, clinical (histocompatibility and immunogenetics) Only

## 2024-01-13 NOTE — Consult Note (Signed)
 NAME:  Glenda Boone, MRN:  [id number], DOB:  [birth year], LOS: 1 ADMISSION DATE:  01/12/2024, CONSULTATION DATE:  01/13/2024 REFERRING MD:  CHARM Elgregawy - TR[facility], CHIEF COMPLAINT: Hypoxic and hypercapnic respiratory failure  History of Present Illness:  Glenda Boone is a 73 year old female with past medical history significant for chronic respiratory failure on 2 to 4 L nasal cannula at baseline, chronic bronchitis, COPD, HFpEF HTN, HLD, type 2 diabetes, prior TIA, atrial fibrillation, and anxiety who presented to the at [facility] 8/19 for complaints of respiratory distress and lethargy.  Workup on admission revealed acute on chronic hypoxic and hypercapnic respiratory failure in the setting of COPD and CHF exacerbations for which she was admitted per hospitalist service.  Morning of 8/20 PCCM consulted for assistance in management given persistent hypercapnia despite BiPAP use.  Pertinent  Medical History  chronic respiratory failure on 2 to 4 L nasal cannula at baseline, chronic bronchitis, COPD, HFpEF HTN, HLD, type 2 diabetes, prior TIA, atrial fibrillation, and anxiety  Significant Hospital Events: Including procedures, antibiotic start and stop dates in addition to other pertinent events   8/19 presented for acute on chronic hypoxic and hypercapnic respiratory failure in the setting of COPD and CHF exacerbations 8/20 PCCM consulted for persistent hypercapnia  Interim History / Subjective:  Seen lying in bed on BiPAP more alert, requesting for something to drink  Objective    Blood pressure (!) 144/91, pulse 85, temperature 98.6 F (37 C), temperature source Axillary, resp. rate (!) 30, weight (!) 156 kg, SpO2 92%.    Vent Mode: BIPAP;PCV FiO2 (%):  [40 %-100 %] 60 % Set Rate:  [20 bmp] 20 bmp PEEP:  [6 cmH20] 6 cmH20 Pressure Support:  [6 cmH20-12 cmH20] 12 cmH20   Intake/Output Summary (Last 24 hours) at 01/13/2024 0930 Last data filed at 01/12/2024 1832 Gross per 24 hour   Intake --  Output 400 ml  Net -400 ml   Filed Weights   01/13/24 0500  Weight: (!) 156 kg    Examination: General: Acute on chronic ill-appearing morbidly obese elderly female lying in bed on BiPAP in no acute distress  HEENT: BiPAP mask in place, MM pink/moist, PERRL,  Neuro: Alert and oriented to self, asking for something to drink CV: s1s2 regular rate and rhythm, no murmur, rubs, or gallops,  PULM: Diminished breath tones bilaterally, slight leak to BiPAP mask, no increased work of breathing, tolerating BiPAP GI: soft, bowel sounds active in all 4 quadrants, non-tender, non-distended Extremities: warm/dry, generalized nonpitting edema  Skin: no rashes or lesions    Resolved problem list   Assessment and Plan  Acute on chronic hypoxic and hypercapnic respiratory failure - VBG on admission with pCO2 107 with bicarb 47.9 indicating significant hypercapnia at baseline Acute COPD exacerbation present on admission - Per chart review it appears patient was wheezing on admission concerning for acute COPD exacerbation History of acute on chronic diastolic congestive heart failure P: Continue BiPAP until mentation is improved enough for a break Aspiration precautions Continue steroids Continue scheduled bronchodilators Check RVP panel Follow-up sputum culture Encourage incentive spirometer use when able Strict intake and output Diuresis as able Schedule Diamox  At this time patient is stable to maintain progressive status with BiPAP use on the floor given improvement in mentation.  Have attempted to contact family to confirm CODE STATUS but no answer to both calls.  If patient were to be intubated she is at high risk for prolonged ventilator support with potential  need for tracheostomy therefore would like to confirmed with family wishes prior to intubation if possible  Best Practice (right click and Reselect all SmartList Selections daily)  Per primary   Labs    CBC: Recent Labs  Lab 01/12/24 1023 01/12/24 1036 01/12/24 1622 01/13/24 0435  WBC 8.0  --   --  11.3*  NEUTROABS 6.1  --   --   --   HGB 11.2* 12.9 11.9* 10.5*  HCT 40.8 38.0 35.0* 37.1  MCV 108.8*  --   --  106.9*  PLT 221  --   --  193    Basic Metabolic Panel: Recent Labs  Lab 01/12/24 1023 01/12/24 1036 01/12/24 1622 01/13/24 0435  NA 140 138 141 144  K 5.0 4.7 4.3 5.7*  CL 91*  --   --  91*  CO2 36*  --   --  43*  GLUCOSE 172*  --   --  216*  BUN 26*  --   --  30*  CREATININE 1.47*  --   --  1.56*  CALCIUM  8.3*  --   --  8.0*   GFR: Estimated Creatinine Clearance: 47.5 mL/min (A) (by C-G formula based on SCr of 1.56 mg/dL (H)). Recent Labs  Lab 01/12/24 1023 01/12/24 1032 01/12/24 1252 01/12/24 1259 01/13/24 0435  PROCALCITON  --   --  <0.10  --   --   WBC 8.0  --   --   --  11.3*  LATICACIDVEN  --  1.9  --  1.5  --     Liver Function Tests: Recent Labs  Lab 01/13/24 0435  AST 22  ALT 13  ALKPHOS 86  BILITOT 0.7  PROT 7.2  ALBUMIN 3.0*   No results for input(s): LIPASE, AMYLASE in the last 168 hours. Recent Labs  Lab 01/12/24 1023  AMMONIA 60*    ABG    Component Value Date/Time   PHART 7.27 (L) 01/13/2024 0540   PCO2ART 108 (HH) 01/13/2024 0540   PO2ART 81 (L) 01/13/2024 0540   HCO3 49.2 (H) 01/13/2024 0824   TCO2 >50 (H) 01/12/2024 1622   O2SAT 65.9 01/13/2024 0824     Coagulation Profile: No results for input(s): INR, PROTIME in the last 168 hours.  Cardiac Enzymes: No results for input(s): CKTOTAL, CKMB, CKMBINDEX, TROPONINI in the last 168 hours.  HbA1C: Hgb A1c MFr Bld  Date/Time Value Ref Range Status  08/09/2023 01:39 PM 7.8 (H) 4.8 - 5.6 % Final    Comment:    (NOTE) Pre diabetes:          5.7%-6.4%  Diabetes:              >6.4%  Glycemic control for   <7.0% adults with diabetes   03/28/2022 05:09 PM 7.3 (H) 4.8 - 5.6 % Final    Comment:    (NOTE) Pre diabetes:           5.7%-6.4%  Diabetes:              >6.4%  Glycemic control for   <7.0% adults with diabetes     CBG: Recent Labs  Lab 01/13/24 0456  GLUCAP 207*    Review of Systems:   Unable to assess   Past Medical History:  She,  has a past medical history of Acute metabolic encephalopathy (12/30/2019), Acute on chronic congestive heart failure (HCC) (08/09/2023), Acute on chronic hypoxic respiratory failure (HCC) (05/23/2022), Anxiety, Atrial fibrillation with rapid ventricular response (HCC) (02/11/2020), Bronchitis,  COPD (chronic obstructive pulmonary disease) (HCC), Depression, Diabetes mellitus, type II (HCC), Dyspnea, Enterococcal bacteremia (08/13/2023), GERD (gastroesophageal reflux disease), Hyperlipidemia, Hypertension, Hypothyroidism, Neuropathy, Ovarian cancer (HCC), Pneumonia due to COVID-19 virus (01/31/2020), Shingles, Shingles outbreak (06/21/2015), Streptococcal bacteremia (08/14/2023), and Thyroid  disease.   Surgical History:   Past Surgical History:  Procedure Laterality Date   ABDOMINAL HYSTERECTOMY     ANKLE SURGERY Left    CHOLECYSTECTOMY     OVARY SURGERY     SMALL INTESTINE SURGERY     TONSILLECTOMY       Social History:   reports that she has been smoking cigarettes. She started smoking about 44 years ago. She has a 44.1 pack-year smoking history. She has never used smokeless tobacco. She reports that she does not drink alcohol  and does not use drugs.   Family History:  Her family history includes Arthritis-Osteo in her sister; Colon cancer in her father; Heart Problems in her mother; Hypertension in her mother.   Allergies Allergies  Allergen Reactions   Shellfish Allergy Hives   Codeine Nausea Only   Demerol [Meperidine Hcl] Anxiety   Neurontin  [Gabapentin ] Other (See Comments)    Nightmares     Home Medications  Prior to Admission medications   Medication Sig Start Date End Date Taking? Authorizing Provider  acetaminophen  (TYLENOL ) 325 MG tablet Take  650 mg by mouth every 6 (six) hours as needed (general discomfort, fever >/= 101F).   Yes [provider]  apixaban  (ELIQUIS ) 5 MG TABS tablet Take 1 tablet (5 mg total) by mouth 2 (two) times daily. 02/16/20  Yes Awanda City, MD  atorvastatin  (LIPITOR) 20 MG tablet Take 1 tablet (20 mg total) by mouth daily. Patient taking differently: Take 20 mg by mouth at bedtime. 05/31/22  Yes Nooruddin, Saad, MD  barrier cream (NON-SPECIFIED) CREA Apply 1 Application topically See admin instructions. Apply to labia and groin area every day and night shift and apply three times daily with every shift with all incontinent care.   Yes [provider]  budesonide  (PULMICORT ) 0.25 MG/2ML nebulizer solution Take 2 mLs (0.25 mg total) by nebulization 2 (two) times daily. 08/17/23  Yes Will Almarie MATSU, MD  buPROPion  (WELLBUTRIN ) 75 MG tablet Take 75 mg by mouth daily.   Yes [provider]  busPIRone  (BUSPAR ) 7.5 MG tablet Take 7.5 mg by mouth 2 (two) times daily.   Yes [provider]  cyanocobalamin 1000 MCG tablet Take 1,000 mcg by mouth once a week.   Yes [provider]  cyclobenzaprine  (FLEXERIL ) 5 MG tablet Take 5 mg by mouth daily as needed for muscle spasms.   Yes [provider]  dapagliflozin  propanediol (FARXIGA ) 10 MG TABS tablet Take 1 tablet (10 mg total) by mouth daily. 05/31/22  Yes Nooruddin, Saad, MD  furosemide  (LASIX ) 20 MG tablet Take 20 mg by mouth See admin instructions. Give 20mg  by mouth once daily in the afternoon for wt gain for 8 days. To be given in addition to regularly scheduled 40mg  twice daily. 01/08/24 01/22/24 Yes [provider]  furosemide  (LASIX ) 40 MG tablet Take 1 tablet (40 mg total) by mouth 2 (two) times daily. 08/17/23  Yes Will Almarie MATSU, MD  levothyroxine  (SYNTHROID ) 200 MCG tablet Take 200 mcg by mouth daily.   Yes [provider]  linagliptin  (TRADJENTA ) 5 MG TABS tablet Take 5 mg by mouth daily.   Yes  [provider]  metoprolol  succinate (TOPROL -XL) 25 MG 24 hr tablet Take 0.5 tablets (12.5  mg total) by mouth daily. 08/18/23  Yes Will Almarie MATSU, MD  nystatin  cream (MYCOSTATIN ) Apply 1 Application topically 2 (two) times daily. Two month therapy for candidiasis 12/05/23 02/06/24 Yes [provider]  OXYGEN Inhale 2 L/min into the lungs continuous.   Yes [provider]  polyethylene glycol powder (MIRALAX ) 17 GM/SCOOP powder Take 17 g by mouth daily as needed (constipation).   Yes [provider]  tetrahydrozoline 0.05 % ophthalmic solution Place 2 drops into both eyes daily.   Yes [provider]  tolterodine (DETROL) 1 MG tablet Take 0.5 mg by mouth daily.   Yes [provider]  umeclidinium-vilanterol (ANORO ELLIPTA) 62.5-25 MCG/ACT AEPB Inhale 1 puff into the lungs daily.   Yes [provider]  Vibegron (GEMTESA) 75 MG TABS Take 75 mg by mouth daily.   Yes [provider]  albuterol  (VENTOLIN  HFA) 108 (90 Base) MCG/ACT inhaler Inhale 2 puffs into the lungs every 6 (six) hours as needed for wheezing or shortness of breath. Patient not taking: Reported on 01/12/2024 01/01/20   Kathrin Mignon DASEN, MD  lidocaine  4 % Place 1 patch onto the skin daily. Patient not taking: Reported on 01/12/2024    [provider]  Zinc Oxide 20 % PSTE Apply 1 application  topically every 6 (six) hours as needed (vaginal irritation). unsupervised self-administration Patient not taking: Reported on 01/12/2024    [provider]     Critical care time: NA  Knute Mazzuca D. Harris, NP-C [facility] Personal contact information can be found on Amion  If no contact or response made please call [phone] 01/13/2024, 9:45 AM

## 2024-01-13 NOTE — Significant Event (Addendum)
 Notified by RN of possible seizure activity. On my assessment she has myoclonic jerking. She is somnolent on BiPAP, she arouses to moderate nonpainful stimuli and maintains brief alertness, not able to provide verbal response to questioning. Hx hypercarbic respiratory failure, suspect worsening hypercarbia resulting in myoclonus. RRT called and at bedside during my evaluation   A/P  Myoclonus, likely worsening hypercarbia  -- RT called to adjust BiPAP, currently on PSV -- Stat ABG, additional management pending results  -- Give nebs   Update:  ABG 7.27 / 108 / 81, Bicarb 43. RT has adjusted to PC 18 / 6. We will repeat a VBG in 1 hr to reassess. If not improving hypercarbia or any worsening mental status will need PCCM evaluation for possible intubation   Dorn Dawson, MD  [facility]

## 2024-01-14 ENCOUNTER — Encounter (HOSPITAL_COMMUNITY): Payer: Self-pay | Admitting: Internal Medicine

## 2024-01-14 ENCOUNTER — Other Ambulatory Visit: Payer: Self-pay

## 2024-01-14 DIAGNOSIS — J9621 Acute and chronic respiratory failure with hypoxia: Secondary | ICD-10-CM | POA: Diagnosis not present

## 2024-01-14 DIAGNOSIS — J441 Chronic obstructive pulmonary disease with (acute) exacerbation: Secondary | ICD-10-CM | POA: Diagnosis not present

## 2024-01-14 DIAGNOSIS — Z7189 Other specified counseling: Secondary | ICD-10-CM | POA: Diagnosis not present

## 2024-01-14 DIAGNOSIS — Z515 Encounter for palliative care: Secondary | ICD-10-CM | POA: Diagnosis not present

## 2024-01-14 LAB — COMPREHENSIVE METABOLIC PANEL WITH GFR
ALT: 17 U/L (ref 0–44)
AST: 18 U/L (ref 15–41)
Albumin: 2.8 g/dL — ABNORMAL LOW (ref 3.5–5.0)
Alkaline Phosphatase: 68 U/L (ref 38–126)
Anion gap: 17 — ABNORMAL HIGH (ref 5–15)
BUN: 37 mg/dL — ABNORMAL HIGH (ref 8–23)
CO2: 39 mmol/L — ABNORMAL HIGH (ref 22–32)
Calcium: 8.2 mg/dL — ABNORMAL LOW (ref 8.9–10.3)
Chloride: 91 mmol/L — ABNORMAL LOW (ref 98–111)
Creatinine, Ser: 1.48 mg/dL — ABNORMAL HIGH (ref 0.44–1.00)
GFR, Estimated: 37 mL/min — ABNORMAL LOW (ref >60)
Glucose, Bld: 147 mg/dL — ABNORMAL HIGH (ref 70–99)
Potassium: 3.6 mmol/L (ref 3.5–5.1)
Sodium: 147 mmol/L — ABNORMAL HIGH (ref 135–145)
Total Bilirubin: 0.8 mg/dL (ref 0.0–1.2)
Total Protein: 6.4 g/dL — ABNORMAL LOW (ref 6.5–8.1)

## 2024-01-14 LAB — CBC
HCT: 31.1 % — ABNORMAL LOW (ref 36.0–46.0)
Hemoglobin: 9 g/dL — ABNORMAL LOW (ref 12.0–15.0)
MCH: 29.9 pg (ref 26.0–34.0)
MCHC: 28.9 g/dL — ABNORMAL LOW (ref 30.0–36.0)
MCV: 103.3 fL — ABNORMAL HIGH (ref 80.0–100.0)
Platelets: 218 K/uL (ref 150–400)
RBC: 3.01 MIL/uL — ABNORMAL LOW (ref 3.87–5.11)
RDW: 16.5 % — ABNORMAL HIGH (ref 11.5–15.5)
WBC: 13.7 K/uL — ABNORMAL HIGH (ref 4.0–10.5)
nRBC: 0 % (ref 0.0–0.2)

## 2024-01-14 LAB — URINE CULTURE: Culture: >=100000 — AB

## 2024-01-14 LAB — BRAIN NATRIURETIC PEPTIDE: B Natriuretic Peptide: 58.1 pg/mL (ref 0.0–100.0)

## 2024-01-14 LAB — MAGNESIUM: Magnesium: 2.4 mg/dL (ref 1.7–2.4)

## 2024-01-14 MED ORDER — ENSURE PLUS HIGH PROTEIN PO LIQD
237.0000 mL | Freq: Two times a day (BID) | ORAL | Status: DC
  Administered 2024-01-14 – 2024-01-20 (×8): 237 mL via ORAL

## 2024-01-14 MED ORDER — POTASSIUM CHLORIDE 10 MEQ/100ML IV SOLN
10.0000 meq | INTRAVENOUS | Status: AC
  Administered 2024-01-14 (×4): 10 meq via INTRAVENOUS
  Filled 2024-01-14 (×4): qty 100

## 2024-01-14 MED ORDER — FUROSEMIDE 10 MG/ML IJ SOLN
40.0000 mg | Freq: Two times a day (BID) | INTRAMUSCULAR | Status: DC
  Administered 2024-01-14 – 2024-01-17 (×7): 40 mg via INTRAVENOUS
  Filled 2024-01-14 (×8): qty 4

## 2024-01-14 MED ORDER — POTASSIUM CHLORIDE CRYS ER 10 MEQ PO TBCR
10.0000 meq | EXTENDED_RELEASE_TABLET | Freq: Once | ORAL | Status: AC
  Administered 2024-01-14: 10 meq via ORAL
  Filled 2024-01-14: qty 1

## 2024-01-14 NOTE — Progress Notes (Signed)
 PT Cancellation Note  Patient Details Name: Glenda Boone MRN: [id number] DOB: 1950-09-18   Cancelled Treatment:    Reason Eval/Treat Not Completed: Medical issues which prohibited therapy (Pt currently on BIPAP. Will follow up later if time allows.)   Kerigan Narvaez 01/14/2024, 9:51 AM

## 2024-01-14 NOTE — Progress Notes (Signed)
 OT Cancellation Note  Patient Details Name: Glenda Boone MRN: [id number] DOB: April 29, 1951   Cancelled Treatment:    Reason Eval/Treat Not Completed: Medical issues which prohibited therapy (Pt requiring bipap, will continue to follow.)  Kennth Mliss Helling 01/14/2024, 11:28 AM Mliss HERO, OTR/L Acute Rehabilitation Services Office: [phone]

## 2024-01-14 NOTE — Plan of Care (Signed)
  Problem: Education: Goal: Knowledge of General Education information will improve Description: Including pain rating scale, medication(s)/side effects and non-pharmacologic comfort measures Outcome: Progressing   Problem: Health Behavior/Discharge Planning: Goal: Ability to manage health-related needs will improve Outcome: Progressing   Problem: Clinical Measurements: Goal: Ability to maintain clinical measurements within normal limits will improve Outcome: Progressing Goal: Will remain free from infection Outcome: Progressing Goal: Diagnostic test results will improve Outcome: Progressing Goal: Respiratory complications will improve Outcome: Progressing Goal: Cardiovascular complication will be avoided Outcome: Progressing   Problem: Activity: Goal: Risk for activity intolerance will decrease Outcome: Progressing   Problem: Nutrition: Goal: Adequate nutrition will be maintained Outcome: Progressing   Problem: Coping: Goal: Level of anxiety will decrease Outcome: Progressing   Problem: Elimination: Goal: Will not experience complications related to bowel motility Outcome: Progressing Goal: Will not experience complications related to urinary retention Outcome: Progressing   Problem: Pain Managment: Goal: General experience of comfort will improve and/or be controlled Outcome: Progressing   Problem: Safety: Goal: Ability to remain free from injury will improve Outcome: Progressing   Problem: Skin Integrity: Goal: Risk for impaired skin integrity will decrease Outcome: Progressing   Problem: Education: Goal: Ability to demonstrate management of disease process will improve Outcome: Progressing Goal: Ability to verbalize understanding of medication therapies will improve Outcome: Progressing Goal: Individualized Educational Video(s) Outcome: Progressing   Problem: Activity: Goal: Capacity to carry out activities will improve Outcome: Progressing    Problem: Cardiac: Goal: Ability to achieve and maintain adequate cardiopulmonary perfusion will improve Outcome: Progressing   Problem: Education: Goal: Knowledge of disease or condition will improve Outcome: Progressing Goal: Knowledge of the prescribed therapeutic regimen will improve Outcome: Progressing Goal: Individualized Educational Video(s) Outcome: Progressing   Problem: Activity: Goal: Ability to tolerate increased activity will improve Outcome: Progressing Goal: Will verbalize the importance of balancing activity with adequate rest periods Outcome: Progressing   Problem: Respiratory: Goal: Ability to maintain a clear airway will improve Outcome: Progressing Goal: Levels of oxygenation will improve Outcome: Progressing Goal: Ability to maintain adequate ventilation will improve Outcome: Progressing   Problem: Activity: Goal: Ability to tolerate increased activity will improve Outcome: Progressing   Problem: Clinical Measurements: Goal: Ability to maintain a body temperature in the normal range will improve Outcome: Progressing   Problem: Respiratory: Goal: Ability to maintain adequate ventilation will improve Outcome: Progressing Goal: Ability to maintain a clear airway will improve Outcome: Progressing

## 2024-01-14 NOTE — Progress Notes (Signed)
 Physical Therapy Evaluation Patient Details Name: Glenda Boone MRN: [id number] DOB: March 04, 1951 Today's Date: 01/14/2024  History of Present Illness  Pt is a 73 year old resident of [facility] who presented on 01/12/24 with lethargy, hypoxia ans swelling. Placed on bipap. Seizure like episode on 01/13/24. PMH: HTN, HLD, DM, TIA, afib, hypothyroidism, CHF, anxiety, depression, obesity, COPD on 2-4L O2, neuropathy.  Clinical Impression  Pt presents with admitting diagnosis above. Co-treat with OT. Pt today was able to sit EOB with +2 Min/Mod A. PTA pt reports that she is a long term resident at [facility] where she was mostly WC bound however was able to transfer with 1 person assistance. Patient will benefit from continued inpatient follow up therapy, <3 hours/day. PT will continue to follow.         If plan is discharge home, recommend the following: Two people to help with walking and/or transfers;A lot of help with bathing/dressing/bathroom;Assistance with cooking/housework;Direct supervision/assist for medications management;Direct supervision/assist for financial management;Assist for transportation;Help with stairs or ramp for entrance;Supervision due to cognitive status   Can travel by private vehicle   No    Equipment Recommendations None recommended by PT  Recommendations for Other Services       Functional Status Assessment Patient has had a recent decline in their functional status and demonstrates the ability to make significant improvements in function in a reasonable and predictable amount of time.     Precautions / Restrictions Precautions Precautions: Fall Restrictions Weight Bearing Restrictions Per Provider Order: No      Mobility  Bed Mobility Overal bed mobility: Needs Assistance Bed Mobility: Supine to Sit, Sit to Sidelying     Supine to sit: Min assist, +2 for physical assistance   Sit to sidelying: Mod assist, +2 for physical assistance General  bed mobility comments: multimodal cues and min assist for LEs to EOB, min assist to raise trunk from elevated HOB with use of rail, guided trunk and assisted LEs back in bed, positioned pt on her R side for pressure relief    Transfers                   General transfer comment: NT    Ambulation/Gait                  Stairs            Wheelchair Mobility     Tilt Bed    Modified Rankin (Stroke Patients Only)       Balance Overall balance assessment: Needs assistance   Sitting balance-Leahy Scale: Fair                                       Pertinent Vitals/Pain Pain Assessment Pain Assessment: Faces Faces Pain Scale: Hurts even more Pain Location: buttocks Pain Descriptors / Indicators: Sore    Home Living Family/patient expects to be discharged to:: Skilled nursing facility                   Additional Comments: resident of [facility]. Pt reports that she is mostly WC bound however is able to transfer with staff assist.    Prior Function Prior Level of Function : Needs assist             Mobility Comments: +1 assist for transfers, w/c bound ADLs Comments: assist with bathing, dressing, pericare after BSC use, able  to self feed and groom, uses     Extremity/Trunk Assessment   Upper Extremity Assessment Upper Extremity Assessment: Generalized weakness    Lower Extremity Assessment Lower Extremity Assessment: Generalized weakness    Cervical / Trunk Assessment Cervical / Trunk Assessment: Other exceptions (weakness, obesity)  Communication   Communication Communication: No apparent difficulties    Cognition Arousal: Alert Behavior During Therapy: Flat affect                             Following commands: Impaired Following commands impaired: Follows one step commands with increased time (and multimodal cues)     Cueing Cueing Techniques: Verbal cues, Tactile cues     General Comments  General comments (skin integrity, edema, etc.): VSS on 5.5L    Exercises     Assessment/Plan    PT Assessment Patient needs continued PT services  PT Problem List Decreased strength;Decreased range of motion;Decreased activity tolerance;Decreased balance;Decreased mobility;Decreased coordination;Decreased cognition;Decreased knowledge of use of DME;Decreased safety awareness;Decreased knowledge of precautions;Cardiopulmonary status limiting activity;Impaired sensation       PT Treatment Interventions DME instruction;Gait training;Stair training;Therapeutic activities;Therapeutic exercise;Functional mobility training;Balance training;Neuromuscular re-education;Patient/family education    PT Goals (Current goals can be found in the Care Plan section)  Acute Rehab PT Goals Patient Stated Goal: to get better PT Goal Formulation: With patient Time For Goal Achievement: 01/28/24 Potential to Achieve Goals: Fair    Frequency Min 1X/week     Co-evaluation PT/OT/SLP Co-Evaluation/Treatment: Yes Reason for Co-Treatment: For patient/therapist safety PT goals addressed during session: Mobility/safety with mobility OT goals addressed during session: Strengthening/ROM;ADL's and self-care       AM-PAC PT 6 Clicks Mobility  Outcome Measure Help needed turning from your back to your side while in a flat bed without using bedrails?: A Lot Help needed moving from lying on your back to sitting on the side of a flat bed without using bedrails?: A Lot Help needed moving to and from a bed to a chair (including a wheelchair)?: A Lot Help needed standing up from a chair using your arms (e.g., wheelchair or bedside chair)?: Total Help needed to walk in hospital room?: Total Help needed climbing 3-5 steps with a railing? : Total 6 Click Score: 9    End of Session Equipment Utilized During Treatment: Oxygen Activity Tolerance: Patient tolerated treatment well Patient left: in bed;with call  bell/phone within reach;with bed alarm set Nurse Communication: Mobility status PT Visit Diagnosis: Other abnormalities of gait and mobility (R26.89)    Time: 8549-8481 PT Time Calculation (min) (ACUTE ONLY): 28 min   Charges:   PT Evaluation $PT Eval Moderate Complexity: 1 Mod   PT General Charges $$ ACUTE PT VISIT: 1 Visit         Sueellen NOVAK, PT, DPT Acute Rehab Services [phone]   Fountain Derusha 01/14/2024, 4:07 PM

## 2024-01-14 NOTE — Plan of Care (Signed)
  Problem: Clinical Measurements: Goal: Ability to maintain clinical measurements within normal limits will improve Outcome: Progressing Goal: Will remain free from infection Outcome: Progressing Goal: Diagnostic test results will improve Outcome: Progressing Goal: Respiratory complications will improve Outcome: Progressing   Problem: Elimination: Goal: Will not experience complications related to bowel motility Outcome: Progressing Goal: Will not experience complications related to urinary retention Outcome: Progressing   Problem: Pain Managment: Goal: General experience of comfort will improve and/or be controlled Outcome: Progressing

## 2024-01-14 NOTE — TOC Initial Note (Signed)
 Transition of Care [facility]) - Initial/Assessment Note    Patient Details  Name: Glenda Boone MRN: [id number] Date of Birth: 1950-06-27  Transition of Care [facility]) CM/SW Contact:    Inocente GORMAN Kindle, LCSW Phone Number: 01/14/2024, 10:50 AM  Clinical Narrative:                 Patient admitted from [facility] under long term care. CSW continuing to follow.   Expected Discharge Plan: Skilled Nursing Facility Barriers to Discharge: Continued Medical Work up   Patient Goals and CMS Choice Patient states their goals for this hospitalization and ongoing recovery are:: Recover          Expected Discharge Plan and Services In-house Referral: Clinical Social Work   Post Acute Care Choice: Skilled Nursing Facility Living arrangements for the past 2 months: Skilled Nursing Facility                                      Prior Living Arrangements/Services Living arrangements for the past 2 months: Skilled Nursing Facility Lives with:: Facility Resident Patient language and need for interpreter reviewed:: Yes Do you feel safe going back to the place where you live?: Yes      Need for Family Participation in Patient Care: Yes (Comment) Care giver support system in place?: Yes (comment)   Criminal Activity/Legal Involvement Pertinent to Current Situation/Hospitalization: No - Comment as needed  Activities of Daily Living   ADL Screening (condition at time of admission) Independently performs ADLs?: No Does the patient have a NEW difficulty with bathing/dressing/toileting/self-feeding that is expected to last >3 days?: Yes (Initiates electronic notice to provider for possible OT consult) Does the patient have a NEW difficulty with getting in/out of bed, walking, or climbing stairs that is expected to last >3 days?: Yes (Initiates electronic notice to provider for possible PT consult) Does the patient have a NEW difficulty with communication that is expected to last >3 days?: Yes  (Initiates electronic notice to provider for possible SLP consult) Is the patient deaf or have difficulty hearing?: No Does the patient have difficulty seeing, even when wearing glasses/contacts?: No Does the patient have difficulty concentrating, remembering, or making decisions?: Yes  Permission Sought/Granted Permission sought to share information with : Facility Medical sales representative, Family Supports Permission granted to share information with : No  Share Information with NAME: HEFLIN,JENNIFER- Sister   [phone]  Permission granted to share info w AGENCY: [facility]        Emotional Assessment Appearance:: Appears stated age Attitude/Demeanor/Rapport: Unable to Assess Affect (typically observed): Unable to Assess Orientation: :  (Disoriented x4) Alcohol  / Substance Use: Not Applicable Psych Involvement: No (comment)  Admission diagnosis:  COPD exacerbation (HCC) [J44.1] Acute cystitis with hematuria [N30.01] Acute respiratory failure with hypoxia (HCC) [J96.01] Acute on chronic respiratory failure with hypoxia (HCC) [J96.21] Community acquired pneumonia, unspecified laterality [J18.9] Patient Active Problem List   Diagnosis Date Noted   Acute on chronic respiratory failure with hypoxia (HCC) 01/12/2024   Diarrhea 08/13/2023   Pressure injury of skin 08/12/2023   Chronic atrial fibrillation (HCC) 05/30/2022   Closed fracture of left ankle 05/24/2022   COPD exacerbation (HCC) 03/28/2022   Acquired thrombophilia (HCC) 02/12/2020   Current use of long term anticoagulation 02/12/2020   Current every day smoker 02/12/2020   COPD (chronic obstructive pulmonary disease) (HCC) 01/31/2020   Essential hypertension 01/31/2020   Obesity, Class III, BMI  40-49.9 (morbid obesity) 01/31/2020   Type 2 diabetes mellitus with hyperlipidemia (HCC)    Hypothyroidism    History of TIA (transient ischemic attack) 09/04/2019   Aphagia 03/09/2019   Chronic bronchitis (HCC) 06/21/2015    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HCC) 11/28/2014   H/O diabetes mellitus 11/28/2014   Anxiety, generalized 11/28/2014   H/O hypercholesterolemia 11/28/2014   H/O: hypothyroidism 11/28/2014   Depression, major, recurrent, moderate (HCC) 11/28/2014   H/O disease 11/28/2014   Neuropathy 11/28/2014   PCP:  Feliciano Devoria LABOR, MD Pharmacy:   [facility] - [place], [place] - [address] [place] [address] Phone: [phone] Fax: [phone]     Social Drivers of Health (SDOH) Social History: SDOH Screenings   Food Insecurity: Patient Unable To Answer (01/13/2024)  Housing: Patient Unable To Answer (01/13/2024)  Transportation Needs: Patient Unable To Answer (01/13/2024)  Utilities: Patient Unable To Answer (01/13/2024)  Social Connections: Patient Unable To Answer (01/13/2024)  Tobacco Use: High Risk (01/14/2024)   SDOH Interventions:     Readmission Risk Interventions    08/14/2023   12:38 PM 08/12/2023    2:14 PM  Readmission Risk Prevention Plan  Transportation Screening Complete Complete  PCP or Specialist Appt within 5-7 Days Complete Complete  Home Care Screening Complete Complete  Medication Review (RN CM) Complete Complete

## 2024-01-14 NOTE — Progress Notes (Addendum)
 PROGRESS NOTE    Glenda Boone  FMW:[id number] DOB: 1951-03-21 DOA: 01/12/2024 PCP: Feliciano Devoria LABOR, MD  Chief Complaint  Patient presents with   Respiratory Distress    Brief Narrative:   Glenda Boone is a 72 y.o. female with medical history significant of hypertension, hyperlipidemia, diabetes, TIA, atrial fibrillation, hypothyroidism, chronic diastolic CHF, anxiety, depression, obesity, chronic respiratory failure with hypoxia on 2-4 L, chronic bronchitis, COPD, neuropathy presenting with respiratory distress and lethargy from SNF.  Workup significant for respiratory distress and hypercapnia where she was started on BiPAP.   Assessment & Plan:   Principal Problem:   Acute on chronic respiratory failure with hypoxia (HCC) Active Problems:   Anxiety, generalized   Neuropathy   History of TIA (transient ischemic attack)   Essential hypertension   Obesity, Class III, BMI 40-49.9 (morbid obesity)   Type 2 diabetes mellitus with hyperlipidemia (HCC)   Hypothyroidism   COPD exacerbation (HCC)   Chronic atrial fibrillation (HCC)  Acute on chronic respiratory failure with hypoxia and hypercapnia  Acute exacerbation of COPD Acute on chronic diastolic CHF -Patient with significant respiratory distress, increased work of breathing and lethargy secondary to CO2 retention -She is on BiPAP, vent setting adjusted PCCM input greatly appreciated. -Continue with IV steroids, continue scheduled bronchodilators. -Check respiratory viral panel, check respiratory cultures -COVID/RSV/flu  is negative -Respiratory viral panel is negative - Chest FiO2 to keep oxygen saturation between 90 and 93% - Finished Diamox , monitor closely as increased anion gap to 17 -Continue with IV diuresis. -high risk for intubation, PCCM consulted, patient currently DNR/DNI - Patient has improved this morning, will give some breaks from continuous BiPAP try on soft diet  Acute encephalopathy due to  hypercapnia - She is more awake this morning  Klebsiella pneumonia UTI -Continue with Rocephin    Hypertension - Continue with metoprolol    Hyperlipidemia - Resume atorvastatin  stable   Hypothyroidism - Continue Boone Synthroid    Atrial fibrillation - Continue with metoprolol  - Continue Boone Eliquis     History of TIA - Continue atorvastatin  and Eliquis  when able   Anxiety Depression - Resume bupropion  when able   Morbid obesity - Body mass index is 59.03 kg/m.  CKD stage IIIb - Monitor closely as diuresis  Hyperkalemia - Recheck potassium level, will hold on Lokelma for now as on aggressive IV diuresis  Hypokalemia - Replaced  Hypomagnesemia - Replaced  Hyponatremia - due to  diuresis, started on diet today and encouraged to increase her fluid intake  Macrocytic anemia B12 deficiency - Low B12 level noted in March, will start on supplement  DVT prophylaxis: Eliquis  Code Status: limited. Family Communication:  Discussed with family at bedside yesterday, and updated daughter by phone today Disposition:   Status is: Inpatient     The patient is critically ill with multi-organ failure.  Critical care was necessary to treat or prevent imminent or life-threatening deterioration of  acute respiratory failure, acute cardiac failure, renal failure Total critical care time spent by me: 35 minutes Time spent personally by me on obtaining history from patient or surrogate, evaluation of the patient, evaluation of patient's response to treatment, ordering and review of laboratory studies, ordering and review of radiographic studies, ordering and performing treatments and interventions, and re-evaluation of the patient's condition. Consultants:  PCCM Palliative medicine     Subjective:  She has been more awake this morning, trying off BiPAP  Objective: Vitals:   01/13/24 2050 01/14/24 0008 01/14/24 0734 01/14/24 0735  BP:  Pulse: (!) 104   67  Resp:  (!) 23   (!) 22  Temp:      TempSrc:      SpO2: 90% 93% 93% 94%  Weight:        Intake/Output Summary (Last 24 hours) at 01/14/2024 1158 Last data filed at 01/13/2024 1550 Gross per 24 hour  Intake 0 ml  Output --  Net 0 ml   Filed Weights   01/13/24 0500  Weight: (!) 156 kg    Examination:  Acutely ill-appearing, more awake today and conversant and appropriate Diminished air entry bilaterally RRR,No Gallops,Rubs or new Murmurs, No Parasternal Heave +ve B.Sounds, Abd Soft, No tenderness, No rebound - guarding or rigidity. No Cyanosis, Clubbing or edema, No new Rash or bruise      Data Reviewed: I have personally reviewed following labs and imaging studies  CBC: Recent Labs  Lab 01/12/24 1023 01/12/24 1036 01/12/24 1622 01/13/24 0435 01/14/24 0413  WBC 8.0  --   --  11.3* 13.7*  NEUTROABS 6.1  --   --   --   --   HGB 11.2* 12.9 11.9* 10.5* 9.0*  HCT 40.8 38.0 35.0* 37.1 31.1*  MCV 108.8*  --   --  106.9* 103.3*  PLT 221  --   --  193 218    Basic Metabolic Panel: Recent Labs  Lab 01/12/24 1023 01/12/24 1036 01/12/24 1622 01/13/24 0435 01/13/24 0943 01/13/24 1422 01/14/24 0413  NA 140 138 141 144  --   --  147*  K 5.0 4.7 4.3 5.7* 5.1 4.6 3.6  CL 91*  --   --  91*  --   --  91*  CO2 36*  --   --  43*  --   --  39*  GLUCOSE 172*  --   --  216*  --   --  147*  BUN 26*  --   --  30*  --   --  37*  CREATININE 1.47*  --   --  1.56*  --   --  1.48*  CALCIUM  8.3*  --   --  8.0*  --   --  8.2*  MG  --   --   --   --   --   --  2.4    GFR: Estimated Creatinine Clearance: 50.1 mL/min (A) (by C-G formula based on SCr of 1.48 mg/dL (H)).  Liver Function Tests: Recent Labs  Lab 01/13/24 0435 01/14/24 0413  AST 22 18  ALT 13 17  ALKPHOS 86 68  BILITOT 0.7 0.8  PROT 7.2 6.4*  ALBUMIN 3.0* 2.8*    CBG: Recent Labs  Lab 01/13/24 0456  GLUCAP 207*     Recent Results (from the past 240 hours)  Resp panel by RT-PCR (RSV, Flu A&B, Covid) Anterior  Nasal Swab     Status: None   Collection Time: 01/12/24 12:52 PM   Specimen: Anterior Nasal Swab  Result Value Ref Range Status   SARS Coronavirus 2 by RT PCR NEGATIVE NEGATIVE Final   Influenza A by PCR NEGATIVE NEGATIVE Final   Influenza B by PCR NEGATIVE NEGATIVE Final    Comment: (NOTE) The Xpert Xpress SARS-CoV-2/FLU/RSV plus assay is intended as an aid in the diagnosis of influenza from Nasopharyngeal swab specimens and should not be used as a sole basis for treatment. Nasal washings and aspirates are unacceptable for Xpert Xpress SARS-CoV-2/FLU/RSV testing.  Fact Sheet for Patients: BloggerCourse.com  Fact Sheet for Healthcare  Providers: SeriousBroker.it  This test is not yet approved or cleared by the [place]  FDA and has been authorized for detection and/or diagnosis of SARS-CoV-2 by FDA under an Emergency Use Authorization (EUA). This EUA will remain in effect (meaning this test can be used) for the duration of the COVID-19 declaration under Section 564(b)(1) of the Act, 21 U.S.C. section 360bbb-3(b)(1), unless the authorization is terminated or revoked.     Resp Syncytial Virus by PCR NEGATIVE NEGATIVE Final    Comment: (NOTE) Fact Sheet for Patients: BloggerCourse.com  Fact Sheet for Healthcare Providers: SeriousBroker.it  This test is not yet approved or cleared by the [place]  FDA and has been authorized for detection and/or diagnosis of SARS-CoV-2 by FDA under an Emergency Use Authorization (EUA). This EUA will remain in effect (meaning this test can be used) for the duration of the COVID-19 declaration under Section 564(b)(1) of the Act, 21 U.S.C. section 360bbb-3(b)(1), unless the authorization is terminated or revoked.  Performed at [facility], [address], [place], [place] [address]   Urine Culture     Status: Abnormal   Collection  Time: 01/12/24 12:52 PM   Specimen: Urine, Clean Catch  Result Value Ref Range Status   Specimen Description URINE, CLEAN CATCH  Final   Special Requests   Final    NONE Performed at [facility], [address] [place] [address], [place], [place] [address]    Culture >=100,000 COLONIES/mL KLEBSIELLA PNEUMONIAE (A)  Final   Report Status 01/14/2024 FINAL  Final   Organism ID, Bacteria KLEBSIELLA PNEUMONIAE (A)  Final      Susceptibility   Klebsiella pneumoniae - MIC*    AMPICILLIN  RESISTANT Resistant     CEFAZOLIN (URINE) Value in next row Sensitive      2 SENSITIVEThis is a modified FDA-approved test that has been validated and its performance characteristics determined by the reporting laboratory.  This laboratory is certified under the Clinical Laboratory Improvement Amendments CLIA as qualified to perform high complexity clinical laboratory testing.    CEFEPIME Value in next row Sensitive      2 SENSITIVEThis is a modified FDA-approved test that has been validated and its performance characteristics determined by the reporting laboratory.  This laboratory is certified under the Clinical Laboratory Improvement Amendments CLIA as qualified to perform high complexity clinical laboratory testing.    ERTAPENEM Value in next row Sensitive      2 SENSITIVEThis is a modified FDA-approved test that has been validated and its performance characteristics determined by the reporting laboratory.  This laboratory is certified under the Clinical Laboratory Improvement Amendments CLIA as qualified to perform high complexity clinical laboratory testing.    CEFTRIAXONE  Value in next row Sensitive      2 SENSITIVEThis is a modified FDA-approved test that has been validated and its performance characteristics determined by the reporting laboratory.  This laboratory is certified under the Clinical Laboratory Improvement Amendments CLIA as qualified to perform high complexity clinical laboratory testing.    CIPROFLOXACIN  Value in next row Sensitive      2 SENSITIVEThis is a modified FDA-approved test that has been validated and its performance characteristics determined by the reporting laboratory.  This laboratory is certified under the Clinical Laboratory Improvement Amendments CLIA as qualified to perform high complexity clinical laboratory testing.    GENTAMICIN Value in next row Sensitive      2 SENSITIVEThis is a modified FDA-approved test that has been validated and its performance characteristics determined by  the reporting laboratory.  This laboratory is certified under the Clinical Laboratory Improvement Amendments CLIA as qualified to perform high complexity clinical laboratory testing.    NITROFURANTOIN Value in next row Intermediate      2 SENSITIVEThis is a modified FDA-approved test that has been validated and its performance characteristics determined by the reporting laboratory.  This laboratory is certified under the Clinical Laboratory Improvement Amendments CLIA as qualified to perform high complexity clinical laboratory testing.    TRIMETH /SULFA  Value in next row Sensitive      2 SENSITIVEThis is a modified FDA-approved test that has been validated and its performance characteristics determined by the reporting laboratory.  This laboratory is certified under the Clinical Laboratory Improvement Amendments CLIA as qualified to perform high complexity clinical laboratory testing.    AMPICILLIN /SULBACTAM Value in next row Sensitive      2 SENSITIVEThis is a modified FDA-approved test that has been validated and its performance characteristics determined by the reporting laboratory.  This laboratory is certified under the Clinical Laboratory Improvement Amendments CLIA as qualified to perform high complexity clinical laboratory testing.    PIP/TAZO Value in next row Sensitive ug/mL     <=4 SENSITIVEThis is a modified FDA-approved test that has been validated and its performance characteristics determined  by the reporting laboratory.  This laboratory is certified under the Clinical Laboratory Improvement Amendments CLIA as qualified to perform high complexity clinical laboratory testing.    MEROPENEM Value in next row Sensitive      <=4 SENSITIVEThis is a modified FDA-approved test that has been validated and its performance characteristics determined by the reporting laboratory.  This laboratory is certified under the Clinical Laboratory Improvement Amendments CLIA as qualified to perform high complexity clinical laboratory testing.    * >=100,000 COLONIES/mL KLEBSIELLA PNEUMONIAE  Respiratory (~20 pathogens) panel by PCR     Status: None   Collection Time: 01/12/24 12:52 PM   Specimen: Nasopharyngeal Swab; Respiratory  Result Value Ref Range Status   Adenovirus NOT DETECTED NOT DETECTED Final   Coronavirus 229E NOT DETECTED NOT DETECTED Final    Comment: (NOTE) The Coronavirus on the Respiratory Panel, DOES NOT test for the novel  Coronavirus (2019 nCoV)    Coronavirus HKU1 NOT DETECTED NOT DETECTED Final   Coronavirus NL63 NOT DETECTED NOT DETECTED Final   Coronavirus OC43 NOT DETECTED NOT DETECTED Final   Metapneumovirus NOT DETECTED NOT DETECTED Final   Rhinovirus / Enterovirus NOT DETECTED NOT DETECTED Final   Influenza A NOT DETECTED NOT DETECTED Final   Influenza B NOT DETECTED NOT DETECTED Final   Parainfluenza Virus 1 NOT DETECTED NOT DETECTED Final   Parainfluenza Virus 2 NOT DETECTED NOT DETECTED Final   Parainfluenza Virus 3 NOT DETECTED NOT DETECTED Final   Parainfluenza Virus 4 NOT DETECTED NOT DETECTED Final   Respiratory Syncytial Virus NOT DETECTED NOT DETECTED Final   Bordetella pertussis NOT DETECTED NOT DETECTED Final   Bordetella Parapertussis NOT DETECTED NOT DETECTED Final   Chlamydophila pneumoniae NOT DETECTED NOT DETECTED Final   Mycoplasma pneumoniae NOT DETECTED NOT DETECTED Final    Comment: Performed at [facility], [address], [place],  [place] [address]         Radiology Studies: CT Angio Chest PE W and/or Wo Contrast Result Date: 01/12/2024 EXAM: CTA CHEST AORTA 01/12/2024 12:35:50 PM TECHNIQUE: CTA of the chest was performed after the administration of intravenous contrast. Multiplanar reformatted images are provided for review. MIP images are provided for review.  Automated exposure control, iterative reconstruction, and/or weight based adjustment of the mA/kV was utilized to reduce the radiation dose to as low as reasonably achievable. COMPARISON: CT dated 09/10/2010. CLINICAL HISTORY: Pulmonary embolism (PE) suspected, high prob. Triage notes; Pt BIB [facility] from [facility] for lethargy/resp distress. Arrived on CPAP with EMS. Pt was found to be slow to respond, at facility, 76% on 4L, diminished lung sounds. PT has hx of intubation. Also hx of CHF \\T \ CKD, swelling in BL all extremities; 34/84 97% CPAP, HR 82, CBG 178. Duoneb given. Facility increased her Lasix  dose this week. FINDINGS: AORTA: Aortic atherosclerosis (ICD10-I70.0). MEDIASTINUM: Moderate cardiomegaly. 3 vessel Coronary artery atherosclerosis. LYMPH NODES: Extremely limited evaluation for thoracic adenopathy. LUNGS AND PLEURA: Right greater than left base dependent air space disease. No large pleural effusion. Tracheobronchial narrowing is likely related to expiratory imaging and a component of tracheomalacia. Right greater than left lower lobe dependent airspace disease. No saddle type pulmonary embolism, no lobar embolism to the right lung base. Pulmonary artery enlargement suggests pulmonary arterial hypertension. UPPER ABDOMEN: Hepatic steatosis with mild caudate lobe enlargement. An incompletely imaged right adrenal mass measures 3.3 x 3.1 cm on image 135 of series 5, similar to 09/10/2010. SOFT TISSUES AND BONES: No acute bone or soft tissue abnormality. ARTIFACTS/LIMITATIONS: Severely degraded, nearly nondiagnostic evaluation secondary to patient body habitus and  position, as well as motion. IMPRESSION: 1. Severely limited, essentially nondiagnostic exam for evaluation of pulmonary emboli beyond the outflow tract. 2. Right greater than left lower lobe dependent airspace disease, possibly atelectasis. At the right lung base, Infection or aspiration cannot be excluded. 3. Pulmonary artery enlargement suggests pulmonary arterial hypertension. 4. Moderate cardiomegaly. Aortic atherosclerosis (icd10-i70.0). Coronary artery atherosclerosis. 5. Hepatic steatosis with caudate lobe enlargement. Cannot exclude mild cirrhosis. 6. Right adrenal mass, similar to prior study from 09/10/10, presumed benign. Electronically signed by: Rockey Kilts MD 01/12/2024 01:14 PM EDT RP Workstation: [id number]        Scheduled Meds:  apixaban   5 mg Oral BID   atorvastatin   20 mg Oral QHS   budesonide   0.25 mg Nebulization BID   furosemide   60 mg Intravenous BID   ipratropium  0.5 mg Nebulization Q6H   levothyroxine   200 mcg Oral Q0600   methylPREDNISolone  (SOLU-MEDROL ) injection  60 mg Intravenous Daily   metoprolol  succinate  12.5 mg Oral Daily   pantoprazole  (PROTONIX ) IV  40 mg Intravenous Q24H   sodium chloride  flush  3 mL Intravenous Q12H   Continuous Infusions:  cefTRIAXone  (ROCEPHIN )  IV 2 g (01/13/24 1340)     LOS: 2 days    Brayton Lye, MD [facility]   To contact the attending provider between 7A-7P or the covering provider during after hours 7P-7A, please log into the web site [email or web address] and access using universal [place] password for that web site. If you do not have the password, please call the hospital operator.  01/14/2024, 11:58 AM

## 2024-01-14 NOTE — Evaluation (Signed)
 Occupational Therapy Evaluation Patient Details Name: Glenda Boone MRN: [id number] DOB: 1951/03/23 Today's Date: 01/14/2024   History of Present Illness   Pt is a 73 year old resident of [facility] who presented on 01/12/24 with lethargy, hypoxia ans swelling. Placed on bipap. Seizure like episode on 01/13/24. PMH: HTN, HLD, DM, TIA, afib, hypothyroidism, CHF, anxiety, depression, obesity, COPD on 2-4L O2, neuropathy.     Clinical Impressions Pt requires one person assist for transfers and uses a w/c as her primary means of mobility. She can self feed and groom. She is otherwise dependent in self care. Pt presents with generalized weakness and decreased activity tolerance. No family available to determine baseline in cognition. Pt requires +2 min to mod assist for bed mobility and demonstrates fair sitting balance. She declined standing today. VSS on 6L O2. Positioned pt on her R side to relieve pain on buttocks, RN made aware. Pt will benefit from a pressure relief mattress. Patient will benefit from continued inpatient follow up therapy, <3 hours/day. Will follow acutely.      If plan is discharge home, recommend the following:   Two people to help with walking and/or transfers;Two people to help with bathing/dressing/bathroom;Assistance with cooking/housework;Direct supervision/assist for medications management;Direct supervision/assist for financial management;Assist for transportation;Help with stairs or ramp for entrance     Functional Status Assessment   Patient has had a recent decline in their functional status and demonstrates the ability to make significant improvements in function in a reasonable and predictable amount of time.     Equipment Recommendations   None recommended by OT     Recommendations for Other Services         Precautions/Restrictions   Precautions Precautions: Fall Restrictions Weight Bearing Restrictions Per Provider Order: No      Mobility Bed Mobility Overal bed mobility: Needs Assistance Bed Mobility: Supine to Sit, Sit to Sidelying     Supine to sit: Min assist, +2 for physical assistance   Sit to sidelying: Mod assist, +2 for physical assistance General bed mobility comments: multimodal cues and min assist for LEs to EOB, min assist to raise trunk from elevated HOB with use of rail, guided trunk and assisted LEs back in bed, positioned pt on her R side for pressure relief    Transfers                   General transfer comment: NT      Balance Overall balance assessment: Needs assistance   Sitting balance-Leahy Scale: Fair                                     ADL either performed or assessed with clinical judgement   ADL Overall ADL's : Needs assistance/impaired Eating/Feeding: Independent;Bed level   Grooming: Supervision/safety;Sitting   Upper Body Bathing: Moderate assistance;Sitting   Lower Body Bathing: Total assistance;Bed level;+2 for physical assistance   Upper Body Dressing : Minimal assistance;Sitting   Lower Body Dressing: Total assistance;Bed level       Toileting- Clothing Manipulation and Hygiene: Total assistance;Bed level               Vision Ability to See in Adequate Light: 0 Adequate Patient Visual Report: No change from baseline       Perception         Praxis         Pertinent Vitals/Pain Pain Assessment Pain  Assessment: Faces Faces Pain Scale: Hurts even more Pain Location: buttocks Pain Descriptors / Indicators: Sore Pain Intervention(s): Repositioned, Monitored during session     Extremity/Trunk Assessment Upper Extremity Assessment Upper Extremity Assessment: Generalized weakness;Right hand dominant   Lower Extremity Assessment Lower Extremity Assessment: Defer to PT evaluation   Cervical / Trunk Assessment Cervical / Trunk Assessment: Other exceptions (weakness, obesity)   Communication  Communication Communication: No apparent difficulties   Cognition Arousal: Alert Behavior During Therapy: Flat affect Cognition: No family/caregiver present to determine baseline                               Following commands: Impaired Following commands impaired: Follows one step commands with increased time (and multimodal cues)     Cueing  General Comments   Cueing Techniques: Verbal cues;Tactile cues      Exercises     Shoulder Instructions      Home Living Family/patient expects to be discharged to:: Skilled nursing facility                                 Additional Comments: resident of [facility]. Pt reports that she is mostly WC bound however is able to transfer with staff assist.      Prior Functioning/Environment Prior Level of Function : Needs assist             Mobility Comments: +1 assist for transfers, w/c bound ADLs Comments: assist with bathing, dressing, pericare after BSC use, able to self feed and groom, uses    OT Problem List: Decreased strength;Decreased activity tolerance;Decreased cognition;Pain;Cardiopulmonary status limiting activity   OT Treatment/Interventions: Therapeutic activities;Patient/family education;Self-care/ADL training      OT Goals(Current goals can be found in the care plan section)   Acute Rehab OT Goals OT Goal Formulation: With patient Time For Goal Achievement: 01/28/24 Potential to Achieve Goals: Fair ADL Goals Pt Will Perform Grooming: with set-up;sitting Pt Will Transfer to Toilet: with +2 assist;stand pivot transfer;bedside commode;with min assist Additional ADL Goal #1: Pt will complete supine to sit with rail with supervision in preparation for ADLs.   OT Frequency:  Min 1X/week    Co-evaluation PT/OT/SLP Co-Evaluation/Treatment: Yes Reason for Co-Treatment: For patient/therapist safety   OT goals addressed during session: Strengthening/ROM;ADL's and self-care       AM-PAC OT 6 Clicks Daily Activity     Outcome Measure Help from another person eating meals?: None Help from another person taking care of personal grooming?: A Little Help from another person toileting, which includes using toliet, bedpan, or urinal?: Total Help from another person bathing (including washing, rinsing, drying)?: A Lot Help from another person to put on and taking off regular upper body clothing?: A Little Help from another person to put on and taking off regular lower body clothing?: Total 6 Click Score: 14   End of Session Equipment Utilized During Treatment: Oxygen Nurse Communication: Mobility status;Other (comment) (needs pressure relief mattress)  Activity Tolerance: Patient tolerated treatment well Patient left: in bed;with call bell/phone within reach;with bed alarm set  OT Visit Diagnosis: Muscle weakness (generalized) (M62.81);Other (comment) (decreased activity tolerance)                Time: 8568-8480 OT Time Calculation (min): 48 min Charges:  OT General Charges $OT Visit: 1 Visit OT Evaluation $OT Eval Moderate Complexity: 1 Mod  Glenda Boone, OTR/L Acute  Rehabilitation Services Office: [phone]   Glenda Boone 01/14/2024, 3:44 PM

## 2024-01-14 NOTE — Consult Note (Signed)
 Palliative Care Consult Note                                  Date: 01/14/2024   Patient Name: Glenda Boone  DOB: [birth year]  MRN: [id number]  Age / Sex: 73 y.o., female  PCP: Feliciano Devoria LABOR, MD Referring Physician: Sherlon Brayton RAMAN, MD  Reason for Consultation: Establishing goals of care  HPI/Patient Profile: 73 y.o. female  with past medical history of COPD with chronic respiratory failure on home O2, chronic diastolic CHF, atrial fibrillation, diabetes, neuropathy, TIA, hypothyroidism, HTN, and HLD who presented to the ED on 01/12/2024 with respiratory distress and lethargy.  She is admitted with acute on chronic respiratory failure and was placed on BiPAP.   Past Medical History:  Diagnosis Date   Acute metabolic encephalopathy 12/30/2019   Acute on chronic congestive heart failure (HCC) 08/09/2023   Acute on chronic hypoxic respiratory failure (HCC) 05/23/2022   Anxiety    Atrial fibrillation with rapid ventricular response (HCC) 02/11/2020   Bronchitis    COPD (chronic obstructive pulmonary disease) (HCC)    Depression    Diabetes mellitus, type II (HCC)    Dyspnea    Enterococcal bacteremia 08/13/2023   GERD (gastroesophageal reflux disease)    Hyperlipidemia    Hypertension    Hypothyroidism    Neuropathy    Ovarian cancer (HCC)    Pneumonia due to COVID-19 virus 01/31/2020   Shingles    Shingles outbreak 06/21/2015   Streptococcal bacteremia 08/14/2023   Thyroid  disease     Palliative Medicine has been consulted for goals of care discussions. Patient and family are faced with anticipatory care needs and complex medical decision making.   Clinical Assessment and Goals of Care:   Extensive chart review has been completed prior to meeting with patient/family including labs, vital signs, imaging, progress/consult notes, orders, medications and available advance directive documents.    I met with *** to discuss  diagnosis, prognosis, GOC, EOL wishes, disposition, and options.  I introduced Palliative Medicine as specialized medical care for people living with serious illness. It focuses on providing relief from the symptoms and stress of a serious illness.   Created space and opportunity for patient and family to express thoughts and feelings regarding current medical situation. Values and goals of care were attempted to be elicited.  A discussion was had today regarding advanced directives. We discussed code status and scopes of care. We discussed the difference between full scope versus limited interventions versus comfort care. The MOST form was introduced and discussed.   Life Review: ***  Functional Status: ***  Patient/Family Understanding of Illness: ***  Discussion: *** We discussed patient's current illness and what it means in the larger context of his/her ongoing co-morbidities. Current clinical status was reviewed. Natural disease trajectory of *** was discussed.  Discussed the importance of continued conversation with the medical team regarding overall plan of care and treatment options, ensuring decisions are within the context of the patients values and GOCs.  Questions and concerns addressed. Patient/family encouraged to call with questions or concerns.   Review of Systems  Objective:   Primary Diagnoses: Present on Admission:  Anxiety, generalized  Chronic atrial fibrillation (HCC)  COPD exacerbation (HCC)  Essential hypertension  Hypothyroidism  Obesity, Class III, BMI 40-49.9 (morbid obesity)  Type 2 diabetes mellitus with hyperlipidemia (HCC)  Acute on chronic respiratory failure with hypoxia (HCC)  Physical Exam  Vital Signs:  BP 127/81   Pulse 67   Temp 98.2 F (36.8 C) (Axillary)   Resp (!) 22   Wt (!) 156 kg   SpO2 94%   BMI 59.03 kg/m   Palliative Assessment/Data: ***     Assessment & Plan:   SUMMARY OF RECOMMENDATIONS   ***  Primary  Decision Maker: {Primary Decision Fjxzm:78612}  Existing Vynca/ACP Documentation: None  Code Status/Advance Care Planning: DNR - Limited  Symptom Management:  ***  Prognosis:  {Palliative Care Prognosis:23504}  Discharge Planning:  {Palliative dispostion:23505}   Discussed with: ***    Thank you for allowing us  to participate in the care of Vauda L Losee   Time Total: ***  Greater than 50%  of this time was spent counseling and coordinating care related to the above assessment and plan.  Signed by: Recardo Loll, NP Palliative Medicine Team  Team Phone # [phone]  For individual providers, please see AMION

## 2024-01-15 DIAGNOSIS — J9621 Acute and chronic respiratory failure with hypoxia: Secondary | ICD-10-CM | POA: Diagnosis not present

## 2024-01-15 LAB — CBC
HCT: 31.4 % — ABNORMAL LOW (ref 36.0–46.0)
Hemoglobin: 9.5 g/dL — ABNORMAL LOW (ref 12.0–15.0)
MCH: 30.2 pg (ref 26.0–34.0)
MCHC: 30.3 g/dL (ref 30.0–36.0)
MCV: 99.7 fL (ref 80.0–100.0)
Platelets: 230 K/uL (ref 150–400)
RBC: 3.15 MIL/uL — ABNORMAL LOW (ref 3.87–5.11)
RDW: 16.5 % — ABNORMAL HIGH (ref 11.5–15.5)
WBC: 11.9 K/uL — ABNORMAL HIGH (ref 4.0–10.5)
nRBC: 0 % (ref 0.0–0.2)

## 2024-01-15 LAB — COMPREHENSIVE METABOLIC PANEL WITH GFR
ALT: 18 U/L (ref 0–44)
AST: 17 U/L (ref 15–41)
Albumin: 2.6 g/dL — ABNORMAL LOW (ref 3.5–5.0)
Alkaline Phosphatase: 64 U/L (ref 38–126)
Anion gap: 10 (ref 5–15)
BUN: 39 mg/dL — ABNORMAL HIGH (ref 8–23)
CO2: 36 mmol/L — ABNORMAL HIGH (ref 22–32)
Calcium: 8.4 mg/dL — ABNORMAL LOW (ref 8.9–10.3)
Chloride: 95 mmol/L — ABNORMAL LOW (ref 98–111)
Creatinine, Ser: 1.47 mg/dL — ABNORMAL HIGH (ref 0.44–1.00)
GFR, Estimated: 37 mL/min — ABNORMAL LOW (ref >60)
Glucose, Bld: 149 mg/dL — ABNORMAL HIGH (ref 70–99)
Potassium: 3.4 mmol/L — ABNORMAL LOW (ref 3.5–5.1)
Sodium: 141 mmol/L (ref 135–145)
Total Bilirubin: 0.5 mg/dL (ref 0.0–1.2)
Total Protein: 6.8 g/dL (ref 6.5–8.1)

## 2024-01-15 LAB — BRAIN NATRIURETIC PEPTIDE: B Natriuretic Peptide: 126 pg/mL — ABNORMAL HIGH (ref 0.0–100.0)

## 2024-01-15 LAB — MAGNESIUM: Magnesium: 2.3 mg/dL (ref 1.7–2.4)

## 2024-01-15 MED ORDER — POTASSIUM CHLORIDE CRYS ER 20 MEQ PO TBCR
40.0000 meq | EXTENDED_RELEASE_TABLET | Freq: Four times a day (QID) | ORAL | Status: AC
  Administered 2024-01-15 (×2): 40 meq via ORAL
  Filled 2024-01-15 (×2): qty 2

## 2024-01-15 MED ORDER — BUDESONIDE 0.25 MG/2ML IN SUSP
0.2500 mg | Freq: Two times a day (BID) | RESPIRATORY_TRACT | Status: DC
  Administered 2024-01-15 – 2024-01-20 (×10): 0.25 mg via RESPIRATORY_TRACT
  Filled 2024-01-15 (×10): qty 2

## 2024-01-15 NOTE — Progress Notes (Signed)
   01/15/24 2210  BiPAP/CPAP/SIPAP  $ Non-Invasive Ventilator  Non-Invasive Vent Subsequent  BiPAP/CPAP/SIPAP Pt Type Adult  BiPAP/CPAP/SIPAP SERVO  Mask Type Full face mask  Set Rate 22 breaths/min  Respiratory Rate 22 breaths/min  IPAP 18 cmH20  EPAP 8 cmH2O  PEEP 8 cmH20  FiO2 (%) 40 %  Minute Ventilation 18.2  Leak 75  Peak Inspiratory Pressure (PIP) 20  Tidal Volume (Vt) 754  Patient Home Machine No  Patient Home Mask No  Patient Home Tubing No  Auto Titrate No  Device Plugged into RED Power Outlet Yes  BiPAP/CPAP /SiPAP Vitals  Resp 18  SpO2 93 %  Bilateral Breath Sounds Clear;Diminished

## 2024-01-15 NOTE — TOC Progression Note (Signed)
 Transition of Care [facility]) - Progression Note    Patient Details  Name: Glenda Boone MRN: [id number] Date of Birth: 02-09-1951  [facility]) CM/SW Contact  Inocente GORMAN Kindle, LCSW Phone Number: 01/15/2024, 5:19 PM  Clinical Narrative:    CSW spoke with patient's sister who was at work. CSW emailed her [facility] bed offers to [email or web address]. CSW requested she let CSW know if she would like patient to return to [place] [facility].    Expected Discharge Plan: Skilled Nursing Facility Barriers to Discharge: Continued Medical Work up               Expected Discharge Plan and Services In-house Referral: Clinical Social Work   Post Acute Care Choice: Skilled Nursing Facility Living arrangements for the past 2 months: Skilled Nursing Facility                                       Social Drivers of Health (SDOH) Interventions SDOH Screenings   Food Insecurity: Patient Unable To Answer (01/13/2024)  Housing: Patient Unable To Answer (01/13/2024)  Transportation Needs: Patient Unable To Answer (01/13/2024)  Utilities: Patient Unable To Answer (01/13/2024)  Social Connections: Patient Unable To Answer (01/13/2024)  Tobacco Use: High Risk (01/14/2024)    Readmission Risk Interventions    08/14/2023   12:38 PM 08/12/2023    2:14 PM  Readmission Risk Prevention Plan  Transportation Screening Complete Complete  PCP or Specialist Appt within 5-7 Days Complete Complete  Home Care Screening Complete Complete  Medication Review (RN CM) Complete Complete

## 2024-01-15 NOTE — Progress Notes (Signed)
 PROGRESS NOTE    Glenda Boone  FMW:[id number] DOB: [birth year] DOA: 01/12/2024 PCP: Feliciano Devoria LABOR, MD  Chief Complaint  Patient presents with   Respiratory Distress    Brief Narrative:   Glenda Boone is a 73 y.o. female with medical history significant of hypertension, hyperlipidemia, diabetes, TIA, atrial fibrillation, hypothyroidism, chronic diastolic CHF, anxiety, depression, obesity, chronic respiratory failure with hypoxia on 2-4 L, chronic bronchitis, COPD, neuropathy presenting with respiratory distress and lethargy from SNF.  Workup significant for respiratory distress and hypercapnia where she was started on BiPAP.   Assessment & Plan:   Principal Problem:   Acute on chronic respiratory failure with hypoxia (HCC) Active Problems:   Anxiety, generalized   Neuropathy   History of TIA (transient ischemic attack)   Essential hypertension   Obesity, Class III, BMI 40-49.9 (morbid obesity)   Type 2 diabetes mellitus with hyperlipidemia (HCC)   Hypothyroidism   COPD exacerbation (HCC)   Chronic atrial fibrillation (HCC)  Acute on chronic respiratory failure with hypoxia and hypercapnia  Acute exacerbation of COPD Acute on chronic diastolic CHF -Patient with significant respiratory distress, increased work of breathing and lethargy secondary to CO2 retention -She is on BiPAP, vent setting adjusted PCCM input greatly appreciated. -Continue with IV steroids, continue scheduled bronchodilators. -Check respiratory viral panel, check respiratory cultures -COVID/RSV/flu  is negative -Respiratory viral panel is negative - Chest FiO2 to keep oxygen saturation between 90 and 93% - Finished Diamox , monitor closely as increased anion gap to 17 -Continue with IV diuresis.  Continue with current dose as renal function and blood pressure remained stable. -high risk for intubation, PCCM consulted, patient currently DNR/DNI - Patient has improved continue with BiPAP at  nighttime and intermittently daytime.    Acute encephalopathy due to hypercapnia - She is more awake  Klebsiella pneumonia UTI -Continue with Rocephin    Hypertension - Continue with metoprolol    Hyperlipidemia - Resume atorvastatin  stable   Hypothyroidism - Continue Boone Synthroid    Atrial fibrillation - Continue with metoprolol  - Continue Boone Eliquis     History of TIA - Continue atorvastatin  and Eliquis  when able   Anxiety Depression - Resume bupropion  when able   Morbid obesity - Body mass index is 57.75 kg/m.  CKD stage IIIb - Monitor closely as diuresis  Hyperkalemia - Recheck potassium level, will hold on Lokelma for now as on aggressive IV diuresis  Hypokalemia - Replaced  Hypomagnesemia - Replaced  Hypernatremia - due to  diuresis, started on diet today and encouraged to increase her fluid intake, sodium improved to 141 today.  Macrocytic anemia B12 deficiency - Low B12 level noted in March, will start on supplement  DVT prophylaxis: Eliquis  Code Status: limited. Family Communication:  Discussed with sister by phone 8/21,  Disposition:   Status is: Inpatient    Consultants:  PCCM Palliative medicine     Subjective: Worked with PT yesterday, was able to sit at the edge of the bed, appetite is good as discussed with staff Objective: Vitals:   01/14/24 1224 01/14/24 2112 01/15/24 0443 01/15/24 0736  BP: 135/61   (!) 159/72  Pulse: 75 75  72  Resp: (!) 21 16  18   Temp: 99.2 F (37.3 C)   98.6 F (37 C)  TempSrc: Oral   Oral  SpO2: 90%   95%  Weight:   (!) 152.6 kg     Intake/Output Summary (Last 24 hours) at 01/15/2024 0927 Last data filed at 01/15/2024 0738 Gross per 24 hour  Intake 493.76 ml  Output 1600 ml  Net -1106.24 ml   Filed Weights   01/13/24 0500 01/15/24 0443  Weight: (!) 156 kg (!) 152.6 kg    Examination:  Somnolent, but wakes up, conversant, more appropriate . Good air entry bilaterally  RRR,No  Gallops,Rubs or new Murmurs, No Parasternal Heave +ve B.Sounds, Abd Soft, No tenderness, No rebound - guarding or rigidity. No Cyanosis, Clubbing or edema, No new Rash or bruise      Data Reviewed: I have personally reviewed following labs and imaging studies  CBC: Recent Labs  Lab 01/12/24 1023 01/12/24 1036 01/12/24 1622 01/13/24 0435 01/14/24 0413 01/15/24 0531  WBC 8.0  --   --  11.3* 13.7* 11.9*  NEUTROABS 6.1  --   --   --   --   --   HGB 11.2* 12.9 11.9* 10.5* 9.0* 9.5*  HCT 40.8 38.0 35.0* 37.1 31.1* 31.4*  MCV 108.8*  --   --  106.9* 103.3* 99.7  PLT 221  --   --  193 218 230    Basic Metabolic Panel: Recent Labs  Lab 01/12/24 1023 01/12/24 1036 01/12/24 1622 01/13/24 0435 01/13/24 0943 01/13/24 1422 01/14/24 0413 01/15/24 0531  NA 140 138 141 144  --   --  147* 141  K 5.0 4.7 4.3 5.7* 5.1 4.6 3.6 3.4*  CL 91*  --   --  91*  --   --  91* 95*  CO2 36*  --   --  43*  --   --  39* 36*  GLUCOSE 172*  --   --  216*  --   --  147* 149*  BUN 26*  --   --  30*  --   --  37* 39*  CREATININE 1.47*  --   --  1.56*  --   --  1.48* 1.47*  CALCIUM  8.3*  --   --  8.0*  --   --  8.2* 8.4*  MG  --   --   --   --   --   --  2.4 2.3    GFR: Estimated Creatinine Clearance: 49.8 mL/min (A) (by C-G formula based on SCr of 1.47 mg/dL (H)).  Liver Function Tests: Recent Labs  Lab 01/13/24 0435 01/14/24 0413 01/15/24 0531  AST 22 18 17   ALT 13 17 18   ALKPHOS 86 68 64  BILITOT 0.7 0.8 0.5  PROT 7.2 6.4* 6.8  ALBUMIN 3.0* 2.8* 2.6*    CBG: Recent Labs  Lab 01/13/24 0456  GLUCAP 207*     Recent Results (from the past 240 hours)  Resp panel by RT-PCR (RSV, Flu A&B, Covid) Anterior Nasal Swab     Status: None   Collection Time: 01/12/24 12:52 PM   Specimen: Anterior Nasal Swab  Result Value Ref Range Status   SARS Coronavirus 2 by RT PCR NEGATIVE NEGATIVE Final   Influenza A by PCR NEGATIVE NEGATIVE Final   Influenza B by PCR NEGATIVE NEGATIVE Final     Comment: (NOTE) The Xpert Xpress SARS-CoV-2/FLU/RSV plus assay is intended as an aid in the diagnosis of influenza from Nasopharyngeal swab specimens and should not be used as a sole basis for treatment. Nasal washings and aspirates are unacceptable for Xpert Xpress SARS-CoV-2/FLU/RSV testing.  Fact Sheet for Patients: BloggerCourse.com  Fact Sheet for Healthcare Providers: SeriousBroker.it  This test is not yet approved or cleared by the [place]  FDA and has been authorized for detection and/or diagnosis of  SARS-CoV-2 by FDA under an Emergency Use Authorization (EUA). This EUA will remain in effect (meaning this test can be used) for the duration of the COVID-19 declaration under Section 564(b)(1) of the Act, 21 U.S.C. section 360bbb-3(b)(1), unless the authorization is terminated or revoked.     Resp Syncytial Virus by PCR NEGATIVE NEGATIVE Final    Comment: (NOTE) Fact Sheet for Patients: BloggerCourse.com  Fact Sheet for Healthcare Providers: SeriousBroker.it  This test is not yet approved or cleared by the [place]  FDA and has been authorized for detection and/or diagnosis of SARS-CoV-2 by FDA under an Emergency Use Authorization (EUA). This EUA will remain in effect (meaning this test can be used) for the duration of the COVID-19 declaration under Section 564(b)(1) of the Act, 21 U.S.C. section 360bbb-3(b)(1), unless the authorization is terminated or revoked.  Performed at [facility], [address] [place], [place] [address]   Urine Culture     Status: Abnormal   Collection Time: 01/12/24 12:52 PM   Specimen: Urine, Clean Catch  Result Value Ref Range Status   Specimen Description URINE, CLEAN CATCH  Final   Special Requests   Final    NONE Performed at [facility], [address], [place], [place] [address]    Culture >=100,000  COLONIES/mL KLEBSIELLA PNEUMONIAE (A)  Final   Report Status 01/14/2024 FINAL  Final   Organism ID, Bacteria KLEBSIELLA PNEUMONIAE (A)  Final      Susceptibility   Klebsiella pneumoniae - MIC*    AMPICILLIN  RESISTANT Resistant     CEFAZOLIN (URINE) Value in next row Sensitive      2 SENSITIVEThis is a modified FDA-approved test that has been validated and its performance characteristics determined by the reporting laboratory.  This laboratory is certified under the Clinical Laboratory Improvement Amendments CLIA as qualified to perform high complexity clinical laboratory testing.    CEFEPIME Value in next row Sensitive      2 SENSITIVEThis is a modified FDA-approved test that has been validated and its performance characteristics determined by the reporting laboratory.  This laboratory is certified under the Clinical Laboratory Improvement Amendments CLIA as qualified to perform high complexity clinical laboratory testing.    ERTAPENEM Value in next row Sensitive      2 SENSITIVEThis is a modified FDA-approved test that has been validated and its performance characteristics determined by the reporting laboratory.  This laboratory is certified under the Clinical Laboratory Improvement Amendments CLIA as qualified to perform high complexity clinical laboratory testing.    CEFTRIAXONE  Value in next row Sensitive      2 SENSITIVEThis is a modified FDA-approved test that has been validated and its performance characteristics determined by the reporting laboratory.  This laboratory is certified under the Clinical Laboratory Improvement Amendments CLIA as qualified to perform high complexity clinical laboratory testing.    CIPROFLOXACIN Value in next row Sensitive      2 SENSITIVEThis is a modified FDA-approved test that has been validated and its performance characteristics determined by the reporting laboratory.  This laboratory is certified under the Clinical Laboratory Improvement Amendments CLIA as  qualified to perform high complexity clinical laboratory testing.    GENTAMICIN Value in next row Sensitive      2 SENSITIVEThis is a modified FDA-approved test that has been validated and its performance characteristics determined by the reporting laboratory.  This laboratory is certified under the Clinical Laboratory Improvement Amendments CLIA as qualified to perform high complexity clinical laboratory testing.  NITROFURANTOIN Value in next row Intermediate      2 SENSITIVEThis is a modified FDA-approved test that has been validated and its performance characteristics determined by the reporting laboratory.  This laboratory is certified under the Clinical Laboratory Improvement Amendments CLIA as qualified to perform high complexity clinical laboratory testing.    TRIMETH /SULFA  Value in next row Sensitive      2 SENSITIVEThis is a modified FDA-approved test that has been validated and its performance characteristics determined by the reporting laboratory.  This laboratory is certified under the Clinical Laboratory Improvement Amendments CLIA as qualified to perform high complexity clinical laboratory testing.    AMPICILLIN /SULBACTAM Value in next row Sensitive      2 SENSITIVEThis is a modified FDA-approved test that has been validated and its performance characteristics determined by the reporting laboratory.  This laboratory is certified under the Clinical Laboratory Improvement Amendments CLIA as qualified to perform high complexity clinical laboratory testing.    PIP/TAZO Value in next row Sensitive ug/mL     <=4 SENSITIVEThis is a modified FDA-approved test that has been validated and its performance characteristics determined by the reporting laboratory.  This laboratory is certified under the Clinical Laboratory Improvement Amendments CLIA as qualified to perform high complexity clinical laboratory testing.    MEROPENEM Value in next row Sensitive      <=4 SENSITIVEThis is a modified  FDA-approved test that has been validated and its performance characteristics determined by the reporting laboratory.  This laboratory is certified under the Clinical Laboratory Improvement Amendments CLIA as qualified to perform high complexity clinical laboratory testing.    * >=100,000 COLONIES/mL KLEBSIELLA PNEUMONIAE  Respiratory (~20 pathogens) panel by PCR     Status: None   Collection Time: 01/12/24 12:52 PM   Specimen: Nasopharyngeal Swab; Respiratory  Result Value Ref Range Status   Adenovirus NOT DETECTED NOT DETECTED Final   Coronavirus 229E NOT DETECTED NOT DETECTED Final    Comment: (NOTE) The Coronavirus on the Respiratory Panel, DOES NOT test for the novel  Coronavirus (2019 nCoV)    Coronavirus HKU1 NOT DETECTED NOT DETECTED Final   Coronavirus NL63 NOT DETECTED NOT DETECTED Final   Coronavirus OC43 NOT DETECTED NOT DETECTED Final   Metapneumovirus NOT DETECTED NOT DETECTED Final   Rhinovirus / Enterovirus NOT DETECTED NOT DETECTED Final   Influenza A NOT DETECTED NOT DETECTED Final   Influenza B NOT DETECTED NOT DETECTED Final   Parainfluenza Virus 1 NOT DETECTED NOT DETECTED Final   Parainfluenza Virus 2 NOT DETECTED NOT DETECTED Final   Parainfluenza Virus 3 NOT DETECTED NOT DETECTED Final   Parainfluenza Virus 4 NOT DETECTED NOT DETECTED Final   Respiratory Syncytial Virus NOT DETECTED NOT DETECTED Final   Bordetella pertussis NOT DETECTED NOT DETECTED Final   Bordetella Parapertussis NOT DETECTED NOT DETECTED Final   Chlamydophila pneumoniae NOT DETECTED NOT DETECTED Final   Mycoplasma pneumoniae NOT DETECTED NOT DETECTED Final    Comment: Performed at [facility], [address], [place], [place] [address]         Radiology Studies: No results found.       Scheduled Meds:  apixaban   5 mg Oral BID   atorvastatin   20 mg Oral QHS   budesonide   0.25 mg Nebulization BID   feeding supplement  237 mL Oral BID BM   furosemide   40 mg Intravenous  BID   levothyroxine   200 mcg Oral Q0600   methylPREDNISolone  (SOLU-MEDROL ) injection  60 mg Intravenous Daily  metoprolol  succinate  12.5 mg Oral Daily   pantoprazole  (PROTONIX ) IV  40 mg Intravenous Q24H   potassium chloride   40 mEq Oral Q6H   sodium chloride  flush  3 mL Intravenous Q12H   Continuous Infusions:  cefTRIAXone  (ROCEPHIN )  IV Stopped (01/14/24 1420)     LOS: 3 days    Brayton Lye, MD [facility]   To contact the attending provider between 7A-7P or the covering provider during after hours 7P-7A, please log into the web site [email or web address] and access using universal [place] password for that web site. If you do not have the password, please call the hospital operator.  01/15/2024, 9:27 AM

## 2024-01-15 NOTE — NC FL2 (Signed)
 [place]  MEDICAID FL2 LEVEL OF CARE FORM     IDENTIFICATION  Patient Name: Glenda Boone Birthdate: 1950-12-31 Sex: female Admission Date (Current Location): 01/12/2024  [facility] and [place] Number:  Producer, television/film/video and Address:  [facility] [place], [address] [place], [place] [address]      Provider Number: [id number]  Attending Physician Name and Address:  Elgergawy, Brayton RAMAN, MD  Relative Name and Phone Number:       Current Level of Care: Hospital Recommended Level of Care: Skilled Nursing Facility Prior Approval Number:    Date Approved/Denied:   PASRR Number: [id number]  Discharge Plan: SNF    Current Diagnoses: Patient Active Problem List   Diagnosis Date Noted   Acute on chronic respiratory failure with hypoxia (HCC) 01/12/2024   Diarrhea 08/13/2023   Pressure injury of skin 08/12/2023   Chronic atrial fibrillation (HCC) 05/30/2022   Closed fracture of left ankle 05/24/2022   COPD exacerbation (HCC) 03/28/2022   Acquired thrombophilia (HCC) 02/12/2020   Current use of long term anticoagulation 02/12/2020   Current every day smoker 02/12/2020   COPD (chronic obstructive pulmonary disease) (HCC) 01/31/2020   Essential hypertension 01/31/2020   Obesity, Class III, BMI 40-49.9 (morbid obesity) 01/31/2020   Type 2 diabetes mellitus with hyperlipidemia (HCC)    Hypothyroidism    History of TIA (transient ischemic attack) 09/04/2019   Aphagia 03/09/2019   Chronic bronchitis (HCC) 06/21/2015   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HCC) 11/28/2014   H/O diabetes mellitus 11/28/2014   Anxiety, generalized 11/28/2014   H/O hypercholesterolemia 11/28/2014   H/O: hypothyroidism 11/28/2014   Depression, major, recurrent, moderate (HCC) 11/28/2014   H/O disease 11/28/2014   Neuropathy 11/28/2014    Orientation RESPIRATION BLADDER Height & Weight     Self, Time, Place  O2 (weaning down from 8L nasal cannula) Incontinent Weight: (!) 336  lb 6.8 oz (152.6 kg) Height:     BEHAVIORAL SYMPTOMS/MOOD NEUROLOGICAL BOWEL NUTRITION STATUS      Continent Diet (See dc summary)  AMBULATORY STATUS COMMUNICATION OF NEEDS Skin   Extensive Assist Verbally Normal                       Personal Care Assistance Level of Assistance  Bathing, Feeding, Dressing Bathing Assistance: Maximum assistance Feeding assistance: Maximum assistance Dressing Assistance: Maximum assistance     Functional Limitations Info             SPECIAL CARE FACTORS FREQUENCY                       Contractures Contractures Info: Not present    Additional Factors Info  Code Status, Allergies Code Status Info: DNR Allergies Info: Shellfish Allergy, Codeine, Demerol (Meperidine Hcl), Neurontin  (Gabapentin )           Current Medications (01/15/2024):  This is the current hospital active medication list Current Facility-Administered Medications  Medication Dose Route Frequency Provider Last Rate Last Admin   acetaminophen  (TYLENOL ) tablet 650 mg  650 mg Oral Q6H PRN Seena Marsa NOVAK, MD       Or   acetaminophen  (TYLENOL ) suppository 650 mg  650 mg Rectal Q6H PRN Seena Marsa NOVAK, MD       albuterol  (PROVENTIL ) (2.5 MG/3ML) 0.083% nebulizer solution 2.5 mg  2.5 mg Nebulization Q2H PRN Seena Marsa NOVAK, MD   2.5 mg at 01/13/24 0550   apixaban  (ELIQUIS ) tablet 5 mg  5 mg  Oral BID Melvin, Alexander B, MD   5 mg at 01/15/24 9063   atorvastatin  (LIPITOR) tablet 20 mg  20 mg Oral QHS Melvin, Alexander B, MD   20 mg at 01/14/24 2142   budesonide  (PULMICORT ) nebulizer solution 0.25 mg  0.25 mg Nebulization BID Elgergawy, Dawood S, MD       cefTRIAXone  (ROCEPHIN ) 2 g in sodium chloride  0.9 % 100 mL IVPB  2 g Intravenous Q24H Elgergawy, Dawood S, MD   Stopped at 01/14/24 1420   feeding supplement (ENSURE PLUS HIGH PROTEIN) liquid 237 mL  237 mL Oral BID BM Elgergawy, Dawood S, MD   237 mL at 01/15/24 0944   furosemide  (LASIX ) injection 40 mg  40  mg Intravenous BID Elgergawy, Dawood S, MD   40 mg at 01/15/24 9062   levothyroxine  (SYNTHROID ) tablet 200 mcg  200 mcg Oral Q0600 Melvin, Alexander B, MD   200 mcg at 01/15/24 9470   methylPREDNISolone  sodium succinate (SOLU-MEDROL ) 125 mg/2 mL injection 60 mg  60 mg Intravenous Daily Harold Scholz, MD   60 mg at 01/15/24 9063   metoprolol  succinate (TOPROL -XL) 24 hr tablet 12.5 mg  12.5 mg Oral Daily Elgergawy, Dawood S, MD   12.5 mg at 01/15/24 9063   pantoprazole  (PROTONIX ) injection 40 mg  40 mg Intravenous Q24H Elgergawy, Dawood S, MD   40 mg at 01/15/24 0529   polyethylene glycol (MIRALAX  / GLYCOLAX ) packet 17 g  17 g Oral Daily PRN Seena Marsa NOVAK, MD       potassium chloride  SA (KLOR-CON  M) CR tablet 40 mEq  40 mEq Oral Q6H Elgergawy, Dawood S, MD   40 mEq at 01/15/24 0944   sodium chloride  flush (NS) 0.9 % injection 3 mL  3 mL Intravenous Q12H Seena Marsa NOVAK, MD   3 mL at 01/15/24 9062     Discharge Medications: Please see discharge summary for a list of discharge medications.  Relevant Imaging Results:  Relevant Lab Results:   Additional Information SSN: [id number]. Add Hospice at SNF  Lars Jeziorski S Coby Antrobus, LCSW

## 2024-01-15 NOTE — Care Management Important Message (Signed)
 Important Message  Patient Details  Name: Glenda Boone MRN: [id number] Date of Birth: [birth year]   Important Message Given:  Yes - Medicare IM     Claretta Deed 01/15/2024, 4:37 PM

## 2024-01-15 NOTE — Evaluation (Signed)
 Speech Language Pathology Evaluation Patient Details Name: LEVI CRASS MRN: [id number] DOB: 1950-12-30 Today's Date: 01/15/2024 Time: 9066-9055 SLP Time Calculation (min) (ACUTE ONLY): 11 min  Problem List:  Patient Active Problem List   Diagnosis Date Noted   Acute on chronic respiratory failure with hypoxia (HCC) 01/12/2024   Diarrhea 08/13/2023   Pressure injury of skin 08/12/2023   Chronic atrial fibrillation (HCC) 05/30/2022   Closed fracture of left ankle 05/24/2022   COPD exacerbation (HCC) 03/28/2022   Acquired thrombophilia (HCC) 02/12/2020   Current use of long term anticoagulation 02/12/2020   Current every day smoker 02/12/2020   COPD (chronic obstructive pulmonary disease) (HCC) 01/31/2020   Essential hypertension 01/31/2020   Obesity, Class III, BMI 40-49.9 (morbid obesity) 01/31/2020   Type 2 diabetes mellitus with hyperlipidemia (HCC)    Hypothyroidism    History of TIA (transient ischemic attack) 09/04/2019   Aphagia 03/09/2019   Chronic bronchitis (HCC) 06/21/2015   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HCC) 11/28/2014   H/O diabetes mellitus 11/28/2014   Anxiety, generalized 11/28/2014   H/O hypercholesterolemia 11/28/2014   H/O: hypothyroidism 11/28/2014   Depression, major, recurrent, moderate (HCC) 11/28/2014   H/O disease 11/28/2014   Neuropathy 11/28/2014   Past Medical History:  Past Medical History:  Diagnosis Date   Acute metabolic encephalopathy 12/30/2019   Acute on chronic congestive heart failure (HCC) 08/09/2023   Acute on chronic hypoxic respiratory failure (HCC) 05/23/2022   Anxiety    Atrial fibrillation with rapid ventricular response (HCC) 02/11/2020   Bronchitis    COPD (chronic obstructive pulmonary disease) (HCC)    Depression    Diabetes mellitus, type II (HCC)    Dyspnea    Enterococcal bacteremia 08/13/2023   GERD (gastroesophageal reflux disease)    Hyperlipidemia    Hypertension    Hypothyroidism    Neuropathy     Ovarian cancer (HCC)    Pneumonia due to COVID-19 virus 01/31/2020   Shingles    Shingles outbreak 06/21/2015   Streptococcal bacteremia 08/14/2023   Thyroid  disease    Past Surgical History:  Past Surgical History:  Procedure Laterality Date   ABDOMINAL HYSTERECTOMY     ANKLE SURGERY Left    CHOLECYSTECTOMY     OVARY SURGERY     SMALL INTESTINE SURGERY     TONSILLECTOMY     HPI:  Pt is a 73 year old resident of [facility] who presented on 01/12/24 with lethargy, hypoxia and swelling. Placed on bipap. Seizure like episode on 01/13/24. Dx acute on chronic resp failure, exacerbation COPD, encephalopathy.   PMH: HTN, HLD, DM, TIA, afib, hypothyroidism, CHF, anxiety, depression, obesity, COPD on 2-4L O2, neuropathy.   Assessment / Plan / Recommendation Clinical Impression  Pt presents with ongoing encephalopathy with awareness that she is coming out of a fog.  Speech is clear with occasional dysfluencies.  Expresses needs/ideas functionally.  Follows commands.  Today she is oriented to time, person, place but has difficulty discussing dx and why she is hospitalized.  Verbal problem-solving is slower but methodical; she catches errors and is able to repair.  Short-term recall fluctuates - she is able to recall the names of nurses and sequence of basic events if relevant to her. She lives long-term at [facility].  No acute care SLP f/u is warranted - pt will get the care she needs and cognitive demands are minimal. Anticipate improvement as encephalopathy clears. SLP will sign off.    SLP Assessment  SLP Recommendation/Assessment: Patient does not  need any further Speech Language Pathology Services SLP Visit Diagnosis: Cognitive communication deficit (R41.841)                        SLP Evaluation Cognition  Overall Cognitive Status: Impaired/Different from baseline Arousal/Alertness: Awake/alert Orientation Level: Oriented to person;Oriented to time;Oriented to  place;Disoriented to situation Attention: Sustained Sustained Attention: Appears intact Memory: Impaired (mild short term recall deficits) Memory Impairment: Retrieval deficit Awareness:  (improving) Problem Solving: Impaired Problem Solving Impairment: Verbal basic       Comprehension  Auditory Comprehension Overall Auditory Comprehension: Appears within functional limits for tasks assessed Visual Recognition/Discrimination Discrimination: Not tested Reading Comprehension Reading Status: Not tested    Expression Expression Primary Mode of Expression: Verbal Verbal Expression Overall Verbal Expression: Appears within functional limits for tasks assessed Written Expression Dominant Hand: Right   Oral / Motor  Oral Motor/Sensory Function Overall Oral Motor/Sensory Function: Within functional limits Motor Speech Overall Motor Speech: Appears within functional limits for tasks assessed            Vona Palma Laurice 01/15/2024, 9:57 AM Palma L. Vona, MA CCC/SLP Clinical Specialist - Acute Care SLP Acute Rehabilitation Services Office number [phone]

## 2024-01-16 DIAGNOSIS — J9621 Acute and chronic respiratory failure with hypoxia: Secondary | ICD-10-CM | POA: Diagnosis not present

## 2024-01-16 LAB — COMPREHENSIVE METABOLIC PANEL WITH GFR
ALT: 29 U/L (ref 0–44)
AST: 29 U/L (ref 15–41)
Albumin: 2.4 g/dL — ABNORMAL LOW (ref 3.5–5.0)
Alkaline Phosphatase: 65 U/L (ref 38–126)
Anion gap: 7 (ref 5–15)
BUN: 42 mg/dL — ABNORMAL HIGH (ref 8–23)
CO2: 35 mmol/L — ABNORMAL HIGH (ref 22–32)
Calcium: 8 mg/dL — ABNORMAL LOW (ref 8.9–10.3)
Chloride: 97 mmol/L — ABNORMAL LOW (ref 98–111)
Creatinine, Ser: 1.47 mg/dL — ABNORMAL HIGH (ref 0.44–1.00)
GFR, Estimated: 37 mL/min — ABNORMAL LOW (ref >60)
Glucose, Bld: 146 mg/dL — ABNORMAL HIGH (ref 70–99)
Potassium: 4 mmol/L (ref 3.5–5.1)
Sodium: 139 mmol/L (ref 135–145)
Total Bilirubin: 0.4 mg/dL (ref 0.0–1.2)
Total Protein: 6.3 g/dL — ABNORMAL LOW (ref 6.5–8.1)

## 2024-01-16 LAB — CBC
HCT: 33 % — ABNORMAL LOW (ref 36.0–46.0)
Hemoglobin: 9.6 g/dL — ABNORMAL LOW (ref 12.0–15.0)
MCH: 29.7 pg (ref 26.0–34.0)
MCHC: 29.1 g/dL — ABNORMAL LOW (ref 30.0–36.0)
MCV: 102.2 fL — ABNORMAL HIGH (ref 80.0–100.0)
Platelets: 223 K/uL (ref 150–400)
RBC: 3.23 MIL/uL — ABNORMAL LOW (ref 3.87–5.11)
RDW: 16.1 % — ABNORMAL HIGH (ref 11.5–15.5)
WBC: 9 K/uL (ref 4.0–10.5)
nRBC: 0 % (ref 0.0–0.2)

## 2024-01-16 MED ORDER — BISACODYL 5 MG PO TBEC: 10.0000 mg | DELAYED_RELEASE_TABLET | Freq: Once | ORAL | Status: DC

## 2024-01-16 MED ORDER — CEFADROXIL 500 MG PO CAPS
1000.0000 mg | ORAL_CAPSULE | Freq: Two times a day (BID) | ORAL | Status: AC
  Administered 2024-01-16 (×2): 1000 mg via ORAL
  Filled 2024-01-16 (×2): qty 2

## 2024-01-16 MED ORDER — PANTOPRAZOLE SODIUM 40 MG PO TBEC
40.0000 mg | DELAYED_RELEASE_TABLET | Freq: Every day | ORAL | Status: DC
  Administered 2024-01-17 – 2024-01-20 (×4): 40 mg via ORAL
  Filled 2024-01-16 (×4): qty 1

## 2024-01-16 MED ORDER — POLYETHYLENE GLYCOL 3350 17 G PO PACK
34.0000 g | PACK | Freq: Every day | ORAL | Status: DC
  Administered 2024-01-17 – 2024-01-19 (×2): 34 g via ORAL
  Filled 2024-01-16 (×4): qty 2

## 2024-01-16 NOTE — TOC Progression Note (Addendum)
 Transition of Care [facility]) - Progression Note    Patient Details  Name: Glenda Boone MRN: [id number] Date of Birth: 29-Aug-1950  [facility]) CM/SW Contact  Dino CHRISTELLA Au, LCSWA Phone Number: 01/16/2024, 11:06 AM  Clinical Narrative:     SW left VM with Crystal Carnella [phone]) to discuss BiPAP  Update 103pm SW received callback from Leontine Carnella [phone]) reports if pt is needing BiPAP at d/c, will need to order it Monday.  SW updated MD via secure chat  Expected Discharge Plan: Skilled Nursing Facility Barriers to Discharge: Continued Medical Work up               Expected Discharge Plan and Services In-house Referral: Clinical Social Work   Post Acute Care Choice: Skilled Nursing Facility Living arrangements for the past 2 months: Skilled Nursing Facility                                       Social Drivers of Health (SDOH) Interventions SDOH Screenings   Food Insecurity: Patient Unable To Answer (01/13/2024)  Housing: Patient Unable To Answer (01/13/2024)  Transportation Needs: Patient Unable To Answer (01/13/2024)  Utilities: Patient Unable To Answer (01/13/2024)  Social Connections: Patient Unable To Answer (01/13/2024)  Tobacco Use: High Risk (01/14/2024)    Readmission Risk Interventions    08/14/2023   12:38 PM 08/12/2023    2:14 PM  Readmission Risk Prevention Plan  Transportation Screening Complete Complete  PCP or Specialist Appt within 5-7 Days Complete Complete  Home Care Screening Complete Complete  Medication Review (RN CM) Complete Complete

## 2024-01-16 NOTE — Plan of Care (Signed)
  Problem: Education: Goal: Knowledge of General Education information will improve Description: Including pain rating scale, medication(s)/side effects and non-pharmacologic comfort measures Outcome: Progressing   Problem: Health Behavior/Discharge Planning: Goal: Ability to manage health-related needs will improve Outcome: Progressing   Problem: Clinical Measurements: Goal: Ability to maintain clinical measurements within normal limits will improve Outcome: Progressing Goal: Will remain free from infection Outcome: Progressing Goal: Diagnostic test results will improve Outcome: Progressing Goal: Respiratory complications will improve Outcome: Progressing Goal: Cardiovascular complication will be avoided Outcome: Progressing   Problem: Activity: Goal: Risk for activity intolerance will decrease Outcome: Progressing   Problem: Nutrition: Goal: Adequate nutrition will be maintained Outcome: Progressing   Problem: Coping: Goal: Level of anxiety will decrease Outcome: Progressing   Problem: Elimination: Goal: Will not experience complications related to bowel motility Outcome: Progressing Goal: Will not experience complications related to urinary retention Outcome: Progressing   Problem: Pain Managment: Goal: General experience of comfort will improve and/or be controlled Outcome: Progressing   Problem: Safety: Goal: Ability to remain free from injury will improve Outcome: Progressing   Problem: Skin Integrity: Goal: Risk for impaired skin integrity will decrease Outcome: Progressing   Problem: Education: Goal: Ability to demonstrate management of disease process will improve Outcome: Progressing Goal: Ability to verbalize understanding of medication therapies will improve Outcome: Progressing Goal: Individualized Educational Video(s) Outcome: Progressing   Problem: Activity: Goal: Capacity to carry out activities will improve Outcome: Progressing    Problem: Cardiac: Goal: Ability to achieve and maintain adequate cardiopulmonary perfusion will improve Outcome: Progressing   Problem: Education: Goal: Knowledge of disease or condition will improve Outcome: Progressing Goal: Knowledge of the prescribed therapeutic regimen will improve Outcome: Progressing Goal: Individualized Educational Video(s) Outcome: Progressing   Problem: Activity: Goal: Ability to tolerate increased activity will improve Outcome: Progressing Goal: Will verbalize the importance of balancing activity with adequate rest periods Outcome: Progressing   Problem: Respiratory: Goal: Ability to maintain a clear airway will improve Outcome: Progressing Goal: Levels of oxygenation will improve Outcome: Progressing Goal: Ability to maintain adequate ventilation will improve Outcome: Progressing   Problem: Activity: Goal: Ability to tolerate increased activity will improve Outcome: Progressing   Problem: Clinical Measurements: Goal: Ability to maintain a body temperature in the normal range will improve Outcome: Progressing   Problem: Respiratory: Goal: Ability to maintain adequate ventilation will improve Outcome: Progressing Goal: Ability to maintain a clear airway will improve Outcome: Progressing

## 2024-01-16 NOTE — Progress Notes (Signed)
PHARMACIST - PHYSICIAN COMMUNICATION  DR:   Elgergawy  CONCERNING: IV to Oral Route Change Policy  RECOMMENDATION: This patient is receiving protonix by the intravenous route.  Based on criteria approved by the Pharmacy and Therapeutics Committee, the intravenous medication(s) is/are being converted to the equivalent oral dose form(s).   DESCRIPTION: These criteria include: The patient is eating (either orally or via tube) and/or has been taking other orally administered medications for a least 24 hours The patient has no evidence of active gastrointestinal bleeding or impaired GI absorption (gastrectomy, short bowel, patient on TNA or NPO).  If you have questions about this conversion, please contact the Pharmacy Department  []   [phone] )  [facility] []   [phone] )  [facility] [x]   [phone] )  [facility] []   [phone] )  [facility] []   [phone] )  [facility]

## 2024-01-16 NOTE — Progress Notes (Signed)
 Ok to optimize ceftriaxone  to PO cefadroxil  to complete 5d of therapy per Dr. Sherlon.  Sergio Batch, PharmD, BCIDP, AAHIVP, CPP Infectious Disease Pharmacist 01/16/2024 11:56 AM

## 2024-01-16 NOTE — Progress Notes (Signed)
 PROGRESS NOTE    Glenda Boone  FMW:[id number] DOB: 01/04/1951 DOA: 01/12/2024 PCP: Feliciano Devoria LABOR, MD  Chief Complaint  Patient presents with   Respiratory Distress    Brief Narrative:   Glenda Boone Home is a 73 y.o. female with medical history significant of hypertension, hyperlipidemia, diabetes, TIA, atrial fibrillation, hypothyroidism, chronic diastolic CHF, anxiety, depression, obesity, chronic respiratory failure with hypoxia on 2-4 L, chronic bronchitis, COPD, neuropathy presenting with respiratory distress and lethargy from SNF.  Workup significant for respiratory distress and hypercapnia where she was started on BiPAP.   Assessment & Plan:   Principal Problem:   Acute on chronic respiratory failure with hypoxia (HCC) Active Problems:   Anxiety, generalized   Neuropathy   History of TIA (transient ischemic attack)   Essential hypertension   Obesity, Class III, BMI 40-49.9 (morbid obesity)   Type 2 diabetes mellitus with hyperlipidemia (HCC)   Hypothyroidism   COPD exacerbation (HCC)   Chronic atrial fibrillation (HCC)  Acute on chronic respiratory failure with hypoxia and hypercapnia  Acute exacerbation of COPD Acute on chronic diastolic CHF -Patient with significant respiratory distress, increased work of breathing and lethargy secondary to CO2 retention -She is on BiPAP, vent setting adjusted PCCM input greatly appreciated.  Battery status much improved, will keep on BiPAP as needed, and bedtime for now and try to wean off gradually -Continue with IV steroids, continue scheduled bronchodilators. -COVID/RSV/flu  is negative -Respiratory viral panel is negative - Just FiO2 to keep oxygen saturation between 90 and 93% - Finished Diamox . -Continue with IV diuresis.  Will keep on 40 mg IV twice daily given stable blood pressure and renal function.. -high risk for intubation, PCCM consulted, patient currently DNR/DNI - Patient has improved continue with BiPAP at  nighttime and intermittently daytime.    Acute encephalopathy due to hypercapnia - She is more awake  Klebsiella pneumonia UTI -Continue with Rocephin    Hypertension - Continue with metoprolol    Hyperlipidemia - Resume atorvastatin  stable   Hypothyroidism - Continue home Synthroid    Atrial fibrillation - Continue with metoprolol  - Continue home Eliquis     History of TIA - Continue atorvastatin  and Eliquis  when able   Anxiety Depression - Resume bupropion  when able   Morbid obesity - Body mass index is 57.41 kg/m.  CKD stage IIIb - Monitor closely as diuresis  Hyperkalemia - Recheck potassium level, will hold on Lokelma for now as on aggressive IV diuresis  Hypokalemia - Replaced  Hypomagnesemia - Replaced  Hypernatremia - due to  diuresis, started on diet today and encouraged to increase her fluid intake, sodium improved to 141 today.  Macrocytic anemia B12 deficiency - Low B12 level noted in March, will start on supplement  DVT prophylaxis: Eliquis  Code Status: limited. Family Communication:  Discussed with sister this morning Disposition:   Status is: Inpatient    Consultants:  PCCM Palliative medicine   Subjective:  Patient used BiPAP overnight, reports generalized weakness, fatigue, but dyspnea has improved.  .  Objective: Vitals:   01/16/24 0500 01/16/24 0545 01/16/24 0800 01/16/24 0813  BP:   99/65 125/76  Pulse:  61 69 64  Resp:  18  20  Temp:    98 F (36.7 C)  TempSrc:    Oral  SpO2:  94% 93% 92%  Weight: (!) 151.7 kg       Intake/Output Summary (Last 24 hours) at 01/16/2024 1022 Last data filed at 01/16/2024 0500 Gross per 24 hour  Intake 3 ml  Output 1800 ml  Net -1797 ml   Filed Weights   01/13/24 0500 01/15/24 0443 01/16/24 0500  Weight: (!) 156 kg (!) 152.6 kg (!) 151.7 kg    Examination:  Chronically frail, ill-appearing, more awake today, patient with morbid obesity No wheezing today, diminished air entry  due to body habitus RRR,No Gallops,Rubs or new Murmurs, No Parasternal Heave +ve B.Sounds, Abd Soft, No tenderness, No rebound - guarding or rigidity. No Cyanosis, Clubbing or edema, No new Rash or bruise       Data Reviewed: I have personally reviewed following labs and imaging studies  CBC: Recent Labs  Lab 01/12/24 1023 01/12/24 1036 01/12/24 1622 01/13/24 0435 01/14/24 0413 01/15/24 0531 01/16/24 0338  WBC 8.0  --   --  11.3* 13.7* 11.9* 9.0  NEUTROABS 6.1  --   --   --   --   --   --   HGB 11.2*   < > 11.9* 10.5* 9.0* 9.5* 9.6*  HCT 40.8   < > 35.0* 37.1 31.1* 31.4* 33.0*  MCV 108.8*  --   --  106.9* 103.3* 99.7 102.2*  PLT 221  --   --  193 218 230 223   < > = values in this interval not displayed.    Basic Metabolic Panel: Recent Labs  Lab 01/12/24 1023 01/12/24 1036 01/12/24 1622 01/13/24 0435 01/13/24 0943 01/13/24 1422 01/14/24 0413 01/15/24 0531 01/16/24 0338  NA 140   < > 141 144  --   --  147* 141 139  K 5.0   < > 4.3 5.7* 5.1 4.6 3.6 3.4* 4.0  CL 91*  --   --  91*  --   --  91* 95* 97*  CO2 36*  --   --  43*  --   --  39* 36* 35*  GLUCOSE 172*  --   --  216*  --   --  147* 149* 146*  BUN 26*  --   --  30*  --   --  37* 39* 42*  CREATININE 1.47*  --   --  1.56*  --   --  1.48* 1.47* 1.47*  CALCIUM  8.3*  --   --  8.0*  --   --  8.2* 8.4* 8.0*  MG  --   --   --   --   --   --  2.4 2.3  --    < > = values in this interval not displayed.    GFR: Estimated Creatinine Clearance: 49.6 mL/min (A) (by C-G formula based on SCr of 1.47 mg/dL (H)).  Liver Function Tests: Recent Labs  Lab 01/13/24 0435 01/14/24 0413 01/15/24 0531 01/16/24 0338  AST 22 18 17 29   ALT 13 17 18 29   ALKPHOS 86 68 64 65  BILITOT 0.7 0.8 0.5 0.4  PROT 7.2 6.4* 6.8 6.3*  ALBUMIN 3.0* 2.8* 2.6* 2.4*    CBG: Recent Labs  Lab 01/13/24 0456  GLUCAP 207*     Recent Results (from the past 240 hours)  Resp panel by RT-PCR (RSV, Flu A&B, Covid) Anterior Nasal Swab      Status: None   Collection Time: 01/12/24 12:52 PM   Specimen: Anterior Nasal Swab  Result Value Ref Range Status   SARS Coronavirus 2 by RT PCR NEGATIVE NEGATIVE Final   Influenza A by PCR NEGATIVE NEGATIVE Final   Influenza B by PCR NEGATIVE NEGATIVE Final    Comment: (NOTE) The Xpert Xpress SARS-CoV-2/FLU/RSV  plus assay is intended as an aid in the diagnosis of influenza from Nasopharyngeal swab specimens and should not be used as a sole basis for treatment. Nasal washings and aspirates are unacceptable for Xpert Xpress SARS-CoV-2/FLU/RSV testing.  Fact Sheet for Patients: BloggerCourse.com  Fact Sheet for Healthcare Providers: SeriousBroker.it  This test is not yet approved or cleared by the [place]  FDA and has been authorized for detection and/or diagnosis of SARS-CoV-2 by FDA under an Emergency Use Authorization (EUA). This EUA will remain in effect (meaning this test can be used) for the duration of the COVID-19 declaration under Section 564(b)(1) of the Act, 21 U.S.C. section 360bbb-3(b)(1), unless the authorization is terminated or revoked.     Resp Syncytial Virus by PCR NEGATIVE NEGATIVE Final    Comment: (NOTE) Fact Sheet for Patients: BloggerCourse.com  Fact Sheet for Healthcare Providers: SeriousBroker.it  This test is not yet approved or cleared by the [place]  FDA and has been authorized for detection and/or diagnosis of SARS-CoV-2 by FDA under an Emergency Use Authorization (EUA). This EUA will remain in effect (meaning this test can be used) for the duration of the COVID-19 declaration under Section 564(b)(1) of the Act, 21 U.S.C. section 360bbb-3(b)(1), unless the authorization is terminated or revoked.  Performed at [facility], [address], [place], [place] [address]   Urine Culture     Status: Abnormal   Collection Time: 01/12/24  12:52 PM   Specimen: Urine, Clean Catch  Result Value Ref Range Status   Specimen Description URINE, CLEAN CATCH  Final   Special Requests   Final    NONE Performed at [facility], [address] [place], [place] [address]    Culture >=100,000 COLONIES/mL KLEBSIELLA PNEUMONIAE (A)  Final   Report Status 01/14/2024 FINAL  Final   Organism ID, Bacteria KLEBSIELLA PNEUMONIAE (A)  Final      Susceptibility   Klebsiella pneumoniae - MIC*    AMPICILLIN  RESISTANT Resistant     CEFAZOLIN (URINE) Value in next row Sensitive      2 SENSITIVEThis is a modified FDA-approved test that has been validated and its performance characteristics determined by the reporting laboratory.  This laboratory is certified under the Clinical Laboratory Improvement Amendments CLIA as qualified to perform high complexity clinical laboratory testing.    CEFEPIME Value in next row Sensitive      2 SENSITIVEThis is a modified FDA-approved test that has been validated and its performance characteristics determined by the reporting laboratory.  This laboratory is certified under the Clinical Laboratory Improvement Amendments CLIA as qualified to perform high complexity clinical laboratory testing.    ERTAPENEM Value in next row Sensitive      2 SENSITIVEThis is a modified FDA-approved test that has been validated and its performance characteristics determined by the reporting laboratory.  This laboratory is certified under the Clinical Laboratory Improvement Amendments CLIA as qualified to perform high complexity clinical laboratory testing.    CEFTRIAXONE  Value in next row Sensitive      2 SENSITIVEThis is a modified FDA-approved test that has been validated and its performance characteristics determined by the reporting laboratory.  This laboratory is certified under the Clinical Laboratory Improvement Amendments CLIA as qualified to perform high complexity clinical laboratory testing.    CIPROFLOXACIN Value in next row  Sensitive      2 SENSITIVEThis is a modified FDA-approved test that has been validated and its performance characteristics determined by the reporting laboratory.  This laboratory  is certified under the Clinical Laboratory Improvement Amendments CLIA as qualified to perform high complexity clinical laboratory testing.    GENTAMICIN Value in next row Sensitive      2 SENSITIVEThis is a modified FDA-approved test that has been validated and its performance characteristics determined by the reporting laboratory.  This laboratory is certified under the Clinical Laboratory Improvement Amendments CLIA as qualified to perform high complexity clinical laboratory testing.    NITROFURANTOIN Value in next row Intermediate      2 SENSITIVEThis is a modified FDA-approved test that has been validated and its performance characteristics determined by the reporting laboratory.  This laboratory is certified under the Clinical Laboratory Improvement Amendments CLIA as qualified to perform high complexity clinical laboratory testing.    TRIMETH /SULFA  Value in next row Sensitive      2 SENSITIVEThis is a modified FDA-approved test that has been validated and its performance characteristics determined by the reporting laboratory.  This laboratory is certified under the Clinical Laboratory Improvement Amendments CLIA as qualified to perform high complexity clinical laboratory testing.    AMPICILLIN /SULBACTAM Value in next row Sensitive      2 SENSITIVEThis is a modified FDA-approved test that has been validated and its performance characteristics determined by the reporting laboratory.  This laboratory is certified under the Clinical Laboratory Improvement Amendments CLIA as qualified to perform high complexity clinical laboratory testing.    PIP/TAZO Value in next row Sensitive ug/mL     <=4 SENSITIVEThis is a modified FDA-approved test that has been validated and its performance characteristics determined by the reporting  laboratory.  This laboratory is certified under the Clinical Laboratory Improvement Amendments CLIA as qualified to perform high complexity clinical laboratory testing.    MEROPENEM Value in next row Sensitive      <=4 SENSITIVEThis is a modified FDA-approved test that has been validated and its performance characteristics determined by the reporting laboratory.  This laboratory is certified under the Clinical Laboratory Improvement Amendments CLIA as qualified to perform high complexity clinical laboratory testing.    * >=100,000 COLONIES/mL KLEBSIELLA PNEUMONIAE  Respiratory (~20 pathogens) panel by PCR     Status: None   Collection Time: 01/12/24 12:52 PM   Specimen: Nasopharyngeal Swab; Respiratory  Result Value Ref Range Status   Adenovirus NOT DETECTED NOT DETECTED Final   Coronavirus 229E NOT DETECTED NOT DETECTED Final    Comment: (NOTE) The Coronavirus on the Respiratory Panel, DOES NOT test for the novel  Coronavirus (2019 nCoV)    Coronavirus HKU1 NOT DETECTED NOT DETECTED Final   Coronavirus NL63 NOT DETECTED NOT DETECTED Final   Coronavirus OC43 NOT DETECTED NOT DETECTED Final   Metapneumovirus NOT DETECTED NOT DETECTED Final   Rhinovirus / Enterovirus NOT DETECTED NOT DETECTED Final   Influenza A NOT DETECTED NOT DETECTED Final   Influenza B NOT DETECTED NOT DETECTED Final   Parainfluenza Virus 1 NOT DETECTED NOT DETECTED Final   Parainfluenza Virus 2 NOT DETECTED NOT DETECTED Final   Parainfluenza Virus 3 NOT DETECTED NOT DETECTED Final   Parainfluenza Virus 4 NOT DETECTED NOT DETECTED Final   Respiratory Syncytial Virus NOT DETECTED NOT DETECTED Final   Bordetella pertussis NOT DETECTED NOT DETECTED Final   Bordetella Parapertussis NOT DETECTED NOT DETECTED Final   Chlamydophila pneumoniae NOT DETECTED NOT DETECTED Final   Mycoplasma pneumoniae NOT DETECTED NOT DETECTED Final    Comment: Performed at [facility], [address], [place], [place] [address]  Radiology Studies: No results found.       Scheduled Meds:  apixaban   5 mg Oral BID   atorvastatin   20 mg Oral QHS   budesonide   0.25 mg Nebulization BID   feeding supplement  237 mL Oral BID BM   furosemide   40 mg Intravenous BID   levothyroxine   200 mcg Oral Q0600   methylPREDNISolone  (SOLU-MEDROL ) injection  60 mg Intravenous Daily   metoprolol  succinate  12.5 mg Oral Daily   pantoprazole  (PROTONIX ) IV  40 mg Intravenous Q24H   sodium chloride  flush  3 mL Intravenous Q12H   Continuous Infusions:  cefTRIAXone  (ROCEPHIN )  IV 2 g (01/15/24 1648)     LOS: 4 days    Brayton Lye, MD [facility]   To contact the attending provider between 7A-7P or the covering provider during after hours 7P-7A, please log into the web site [email or web address] and access using universal [place] password for that web site. If you do not have the password, please call the hospital operator.  01/16/2024, 10:22 AM

## 2024-01-17 DIAGNOSIS — J9621 Acute and chronic respiratory failure with hypoxia: Secondary | ICD-10-CM | POA: Diagnosis not present

## 2024-01-17 LAB — COMPREHENSIVE METABOLIC PANEL WITH GFR
ALT: 32 U/L (ref 0–44)
AST: 21 U/L (ref 15–41)
Albumin: 2.6 g/dL — ABNORMAL LOW (ref 3.5–5.0)
Alkaline Phosphatase: 65 U/L (ref 38–126)
Anion gap: 12 (ref 5–15)
BUN: 42 mg/dL — ABNORMAL HIGH (ref 8–23)
CO2: 32 mmol/L (ref 22–32)
Calcium: 8.5 mg/dL — ABNORMAL LOW (ref 8.9–10.3)
Chloride: 92 mmol/L — ABNORMAL LOW (ref 98–111)
Creatinine, Ser: 1.24 mg/dL — ABNORMAL HIGH (ref 0.44–1.00)
GFR, Estimated: 46 mL/min — ABNORMAL LOW (ref >60)
Glucose, Bld: 176 mg/dL — ABNORMAL HIGH (ref 70–99)
Potassium: 3.9 mmol/L (ref 3.5–5.1)
Sodium: 136 mmol/L (ref 135–145)
Total Bilirubin: 0.4 mg/dL (ref 0.0–1.2)
Total Protein: 6.7 g/dL (ref 6.5–8.1)

## 2024-01-17 LAB — CBC
HCT: 33.3 % — ABNORMAL LOW (ref 36.0–46.0)
Hemoglobin: 10.1 g/dL — ABNORMAL LOW (ref 12.0–15.0)
MCH: 29.4 pg (ref 26.0–34.0)
MCHC: 30.3 g/dL (ref 30.0–36.0)
MCV: 96.8 fL (ref 80.0–100.0)
Platelets: 235 K/uL (ref 150–400)
RBC: 3.44 MIL/uL — ABNORMAL LOW (ref 3.87–5.11)
RDW: 15.4 % (ref 11.5–15.5)
WBC: 7.7 K/uL (ref 4.0–10.5)
nRBC: 0 % (ref 0.0–0.2)

## 2024-01-17 MED ORDER — ACETAZOLAMIDE 250 MG PO TABS
250.0000 mg | ORAL_TABLET | Freq: Three times a day (TID) | ORAL | Status: AC
  Administered 2024-01-17 – 2024-01-18 (×3): 250 mg via ORAL
  Filled 2024-01-17 (×3): qty 1

## 2024-01-17 MED ORDER — PREDNISONE 20 MG PO TABS
40.0000 mg | ORAL_TABLET | Freq: Every day | ORAL | Status: DC
  Administered 2024-01-18 – 2024-01-20 (×3): 40 mg via ORAL
  Filled 2024-01-17 (×3): qty 2

## 2024-01-17 MED ORDER — VITAMIN B-12 1000 MCG PO TABS
1000.0000 ug | ORAL_TABLET | ORAL | Status: DC
  Administered 2024-01-17: 1000 ug via ORAL
  Filled 2024-01-17: qty 1

## 2024-01-17 MED ORDER — BUPROPION HCL 75 MG PO TABS
75.0000 mg | ORAL_TABLET | Freq: Every day | ORAL | Status: DC
  Administered 2024-01-17 – 2024-01-20 (×4): 75 mg via ORAL
  Filled 2024-01-17 (×5): qty 1

## 2024-01-17 NOTE — Plan of Care (Signed)
  Problem: Education: Goal: Knowledge of General Education information will improve Description: Including pain rating scale, medication(s)/side effects and non-pharmacologic comfort measures Outcome: Progressing   Problem: Health Behavior/Discharge Planning: Goal: Ability to manage health-related needs will improve Outcome: Progressing   Problem: Clinical Measurements: Goal: Ability to maintain clinical measurements within normal limits will improve Outcome: Progressing Goal: Will remain free from infection Outcome: Progressing Goal: Diagnostic test results will improve Outcome: Progressing Goal: Respiratory complications will improve Outcome: Progressing Goal: Cardiovascular complication will be avoided Outcome: Progressing   Problem: Activity: Goal: Risk for activity intolerance will decrease Outcome: Progressing   Problem: Nutrition: Goal: Adequate nutrition will be maintained Outcome: Progressing   Problem: Coping: Goal: Level of anxiety will decrease Outcome: Progressing   Problem: Elimination: Goal: Will not experience complications related to bowel motility Outcome: Progressing Goal: Will not experience complications related to urinary retention Outcome: Progressing   Problem: Pain Managment: Goal: General experience of comfort will improve and/or be controlled Outcome: Progressing   Problem: Safety: Goal: Ability to remain free from injury will improve Outcome: Progressing   Problem: Skin Integrity: Goal: Risk for impaired skin integrity will decrease Outcome: Progressing   Problem: Education: Goal: Ability to demonstrate management of disease process will improve Outcome: Progressing Goal: Ability to verbalize understanding of medication therapies will improve Outcome: Progressing Goal: Individualized Educational Video(s) Outcome: Progressing   Problem: Activity: Goal: Capacity to carry out activities will improve Outcome: Progressing    Problem: Cardiac: Goal: Ability to achieve and maintain adequate cardiopulmonary perfusion will improve Outcome: Progressing   Problem: Education: Goal: Knowledge of disease or condition will improve Outcome: Progressing Goal: Knowledge of the prescribed therapeutic regimen will improve Outcome: Progressing Goal: Individualized Educational Video(s) Outcome: Progressing   Problem: Activity: Goal: Ability to tolerate increased activity will improve Outcome: Progressing Goal: Will verbalize the importance of balancing activity with adequate rest periods Outcome: Progressing   Problem: Respiratory: Goal: Ability to maintain a clear airway will improve Outcome: Progressing Goal: Levels of oxygenation will improve Outcome: Progressing Goal: Ability to maintain adequate ventilation will improve Outcome: Progressing   Problem: Activity: Goal: Ability to tolerate increased activity will improve Outcome: Progressing   Problem: Clinical Measurements: Goal: Ability to maintain a body temperature in the normal range will improve Outcome: Progressing   Problem: Respiratory: Goal: Ability to maintain adequate ventilation will improve Outcome: Progressing Goal: Ability to maintain a clear airway will improve Outcome: Progressing

## 2024-01-17 NOTE — Progress Notes (Signed)
 PROGRESS NOTE    Glenda [facility]  FMW:[id number] DOB: Dec 19, 1950 DOA: 01/12/2024 PCP: Feliciano Devoria LABOR, MD  Chief Complaint  Patient presents with   Respiratory Distress    Brief Narrative:   Glenda Boone is a 73 y.o. female with medical history significant of hypertension, hyperlipidemia, diabetes, TIA, atrial fibrillation, hypothyroidism, chronic diastolic CHF, anxiety, depression, obesity, chronic respiratory failure with hypoxia on 2-4 L, chronic bronchitis, COPD, neuropathy presenting with respiratory distress and lethargy from SNF.  Workup significant for respiratory distress and hypercapnia where she was started on BiPAP.   Assessment & Plan:   Principal Problem:   Acute on chronic respiratory failure with hypoxia (HCC) Active Problems:   Anxiety, generalized   Neuropathy   History of TIA (transient ischemic attack)   Essential hypertension   Obesity, Class III, BMI 40-49.9 (morbid obesity)   Type 2 diabetes mellitus with hyperlipidemia (HCC)   Hypothyroidism   COPD exacerbation (HCC)   Chronic atrial fibrillation (HCC)  Acute on chronic respiratory failure with hypoxia and hypercapnia  Acute exacerbation of COPD Acute on chronic diastolic CHF -Patient with significant respiratory distress, increased work of breathing and lethargy secondary to CO2 retention -She is on BiPAP, vent setting adjusted PCCM input greatly appreciated.  Battery status much improved, will keep on BiPAP as needed, and bedtime for now and try to wean off gradually -Continue with IV steroids, continue scheduled bronchodilators. -COVID/RSV/flu  is negative -Respiratory viral panel is negative - Just FiO2 to keep oxygen saturation between 90 and 93% - Finished Diamox . -Continue with IV diuresis.  Will keep on 40 mg IV twice daily given stable blood pressure and renal function.. -high risk for intubation, PCCM consulted, patient currently DNR/DNI - Patient has improved, for now she does not  require BiPAP daytime, only at nighttime, she will benefit from BiPAP at [facility] given her OHS, and chronic COPD and CO2 retention, will have social worker reach to her SNF facility to start arrangement for BiPAP at bedtime.     Acute encephalopathy due to hypercapnia - She is more awake  Klebsiella pneumonia UTI -Continue with Rocephin    Hypertension - Continue with metoprolol    Hyperlipidemia - Resume atorvastatin  stable   Hypothyroidism - Continue [facility] Synthroid    Atrial fibrillation - Continue with metoprolol  - Continue [facility] Eliquis     History of TIA - Continue atorvastatin  and Eliquis  when able   Anxiety Depression - Resume bupropion  when able   Morbid obesity - Body mass index is 57.52 kg/m.  CKD stage IIIb - Monitor closely as diuresis  Hyperkalemia - Recheck potassium level, will hold on Lokelma for now as on aggressive IV diuresis  Hypokalemia - Replaced  Hypomagnesemia - Replaced  Hypernatremia - due to  diuresis, started on diet today and encouraged to increase her fluid intake, sodium improved to 141 today.  Macrocytic anemia B12 deficiency - Low B12 level noted in March, will start on supplement  DVT prophylaxis: Eliquis  Code Status: limited. Family Communication:  None at bedside Disposition:   Status is: Inpatient    Consultants:  PCCM Palliative medicine   Subjective:  Significant events overnight, she denies any complaints today, reports she has been compliant with BiPAP  Objective: Vitals:   01/17/24 0200 01/17/24 0400 01/17/24 0500 01/17/24 0800  BP:  139/73  (!) 142/68  Pulse: 66 (!) 52  65  Resp: (!) 21 (!) 22  (!) 22  Temp:  98.4 F (36.9 C)  97.8 F (36.6 C)  TempSrc:  Axillary  Oral  SpO2: 97% 94%  94%  Weight:   (!) 152 kg     Intake/Output Summary (Last 24 hours) at 01/17/2024 1158 Last data filed at 01/16/2024 2213 Gross per 24 hour  Intake 3 ml  Output 1200 ml  Net -1197 ml   Filed Weights   01/15/24  0443 01/16/24 0500 01/17/24 0500  Weight: (!) 152.6 kg (!) 151.7 kg (!) 152 kg    Examination:  Well-appearing, frail, awake and appropriate, she is with morbid obesity  Good air entry today, minimal wheezing.   RRR,No Gallops,Rubs or new Murmurs, No Parasternal Heave +ve B.Sounds, Abd Soft, No tenderness, No rebound - guarding or rigidity. No Cyanosis, Clubbing or edema, No new Rash or bruise       Data Reviewed: I have personally reviewed following labs and imaging studies  CBC: Recent Labs  Lab 01/12/24 1023 01/12/24 1036 01/13/24 0435 01/14/24 0413 01/15/24 0531 01/16/24 0338 01/17/24 0521  WBC 8.0  --  11.3* 13.7* 11.9* 9.0 7.7  NEUTROABS 6.1  --   --   --   --   --   --   HGB 11.2*   < > 10.5* 9.0* 9.5* 9.6* 10.1*  HCT 40.8   < > 37.1 31.1* 31.4* 33.0* 33.3*  MCV 108.8*  --  106.9* 103.3* 99.7 102.2* 96.8  PLT 221  --  193 218 230 223 235   < > = values in this interval not displayed.    Basic Metabolic Panel: Recent Labs  Lab 01/13/24 0435 01/13/24 0943 01/13/24 1422 01/14/24 0413 01/15/24 0531 01/16/24 0338 01/17/24 0521  NA 144  --   --  147* 141 139 136  K 5.7*   < > 4.6 3.6 3.4* 4.0 3.9  CL 91*  --   --  91* 95* 97* 92*  CO2 43*  --   --  39* 36* 35* 32  GLUCOSE 216*  --   --  147* 149* 146* 176*  BUN 30*  --   --  37* 39* 42* 42*  CREATININE 1.56*  --   --  1.48* 1.47* 1.47* 1.24*  CALCIUM  8.0*  --   --  8.2* 8.4* 8.0* 8.5*  MG  --   --   --  2.4 2.3  --   --    < > = values in this interval not displayed.    GFR: Estimated Creatinine Clearance: 58.8 mL/min (A) (by C-G formula based on SCr of 1.24 mg/dL (H)).  Liver Function Tests: Recent Labs  Lab 01/13/24 0435 01/14/24 0413 01/15/24 0531 01/16/24 0338 01/17/24 0521  AST 22 18 17 29 21   ALT 13 17 18 29  32  ALKPHOS 86 68 64 65 65  BILITOT 0.7 0.8 0.5 0.4 0.4  PROT 7.2 6.4* 6.8 6.3* 6.7  ALBUMIN 3.0* 2.8* 2.6* 2.4* 2.6*    CBG: Recent Labs  Lab 01/13/24 0456  GLUCAP 207*      Recent Results (from the past 240 hours)  Resp panel by RT-PCR (RSV, Flu A&B, Covid) Anterior Nasal Swab     Status: None   Collection Time: 01/12/24 12:52 PM   Specimen: Anterior Nasal Swab  Result Value Ref Range Status   SARS Coronavirus 2 by RT PCR NEGATIVE NEGATIVE Final   Influenza A by PCR NEGATIVE NEGATIVE Final   Influenza B by PCR NEGATIVE NEGATIVE Final    Comment: (NOTE) The Xpert Xpress SARS-CoV-2/FLU/RSV plus assay is intended as an aid in the  diagnosis of influenza from Nasopharyngeal swab specimens and should not be used as a sole basis for treatment. Nasal washings and aspirates are unacceptable for Xpert Xpress SARS-CoV-2/FLU/RSV testing.  Fact Sheet for Patients: BloggerCourse.com  Fact Sheet for Healthcare Providers: SeriousBroker.it  This test is not yet approved or cleared by the [place]  FDA and has been authorized for detection and/or diagnosis of SARS-CoV-2 by FDA under an Emergency Use Authorization (EUA). This EUA will remain in effect (meaning this test can be used) for the duration of the COVID-19 declaration under Section 564(b)(1) of the Act, 21 U.S.C. section 360bbb-3(b)(1), unless the authorization is terminated or revoked.     Resp Syncytial Virus by PCR NEGATIVE NEGATIVE Final    Comment: (NOTE) Fact Sheet for Patients: BloggerCourse.com  Fact Sheet for Healthcare Providers: SeriousBroker.it  This test is not yet approved or cleared by the [place]  FDA and has been authorized for detection and/or diagnosis of SARS-CoV-2 by FDA under an Emergency Use Authorization (EUA). This EUA will remain in effect (meaning this test can be used) for the duration of the COVID-19 declaration under Section 564(b)(1) of the Act, 21 U.S.C. section 360bbb-3(b)(1), unless the authorization is terminated or revoked.  Performed at [facility], [address], [place], [place] [address]   Urine Culture     Status: Abnormal   Collection Time: 01/12/24 12:52 PM   Specimen: Urine, Clean Catch  Result Value Ref Range Status   Specimen Description URINE, CLEAN CATCH  Final   Special Requests   Final    NONE Performed at [facility], [address], [place], [place] [address]    Culture >=100,000 COLONIES/mL KLEBSIELLA PNEUMONIAE (A)  Final   Report Status 01/14/2024 FINAL  Final   Organism ID, Bacteria KLEBSIELLA PNEUMONIAE (A)  Final      Susceptibility   Klebsiella pneumoniae - MIC*    AMPICILLIN  RESISTANT Resistant     CEFAZOLIN (URINE) Value in next row Sensitive      2 SENSITIVEThis is a modified FDA-approved test that has been validated and its performance characteristics determined by the reporting laboratory.  This laboratory is certified under the Clinical Laboratory Improvement Amendments CLIA as qualified to perform high complexity clinical laboratory testing.    CEFEPIME Value in next row Sensitive      2 SENSITIVEThis is a modified FDA-approved test that has been validated and its performance characteristics determined by the reporting laboratory.  This laboratory is certified under the Clinical Laboratory Improvement Amendments CLIA as qualified to perform high complexity clinical laboratory testing.    ERTAPENEM Value in next row Sensitive      2 SENSITIVEThis is a modified FDA-approved test that has been validated and its performance characteristics determined by the reporting laboratory.  This laboratory is certified under the Clinical Laboratory Improvement Amendments CLIA as qualified to perform high complexity clinical laboratory testing.    CEFTRIAXONE  Value in next row Sensitive      2 SENSITIVEThis is a modified FDA-approved test that has been validated and its performance characteristics determined by the reporting laboratory.  This laboratory is certified under the Clinical Laboratory Improvement  Amendments CLIA as qualified to perform high complexity clinical laboratory testing.    CIPROFLOXACIN Value in next row Sensitive      2 SENSITIVEThis is a modified FDA-approved test that has been validated and its performance characteristics determined by the reporting laboratory.  This laboratory is certified under the Clinical Laboratory Improvement Amendments CLIA  as qualified to perform high complexity clinical laboratory testing.    GENTAMICIN Value in next row Sensitive      2 SENSITIVEThis is a modified FDA-approved test that has been validated and its performance characteristics determined by the reporting laboratory.  This laboratory is certified under the Clinical Laboratory Improvement Amendments CLIA as qualified to perform high complexity clinical laboratory testing.    NITROFURANTOIN Value in next row Intermediate      2 SENSITIVEThis is a modified FDA-approved test that has been validated and its performance characteristics determined by the reporting laboratory.  This laboratory is certified under the Clinical Laboratory Improvement Amendments CLIA as qualified to perform high complexity clinical laboratory testing.    TRIMETH /SULFA  Value in next row Sensitive      2 SENSITIVEThis is a modified FDA-approved test that has been validated and its performance characteristics determined by the reporting laboratory.  This laboratory is certified under the Clinical Laboratory Improvement Amendments CLIA as qualified to perform high complexity clinical laboratory testing.    AMPICILLIN /SULBACTAM Value in next row Sensitive      2 SENSITIVEThis is a modified FDA-approved test that has been validated and its performance characteristics determined by the reporting laboratory.  This laboratory is certified under the Clinical Laboratory Improvement Amendments CLIA as qualified to perform high complexity clinical laboratory testing.    PIP/TAZO Value in next row Sensitive ug/mL     <=4 SENSITIVEThis  is a modified FDA-approved test that has been validated and its performance characteristics determined by the reporting laboratory.  This laboratory is certified under the Clinical Laboratory Improvement Amendments CLIA as qualified to perform high complexity clinical laboratory testing.    MEROPENEM Value in next row Sensitive      <=4 SENSITIVEThis is a modified FDA-approved test that has been validated and its performance characteristics determined by the reporting laboratory.  This laboratory is certified under the Clinical Laboratory Improvement Amendments CLIA as qualified to perform high complexity clinical laboratory testing.    * >=100,000 COLONIES/mL KLEBSIELLA PNEUMONIAE  Respiratory (~20 pathogens) panel by PCR     Status: None   Collection Time: 01/12/24 12:52 PM   Specimen: Nasopharyngeal Swab; Respiratory  Result Value Ref Range Status   Adenovirus NOT DETECTED NOT DETECTED Final   Coronavirus 229E NOT DETECTED NOT DETECTED Final    Comment: (NOTE) The Coronavirus on the Respiratory Panel, DOES NOT test for the novel  Coronavirus (2019 nCoV)    Coronavirus HKU1 NOT DETECTED NOT DETECTED Final   Coronavirus NL63 NOT DETECTED NOT DETECTED Final   Coronavirus OC43 NOT DETECTED NOT DETECTED Final   Metapneumovirus NOT DETECTED NOT DETECTED Final   Rhinovirus / Enterovirus NOT DETECTED NOT DETECTED Final   Influenza A NOT DETECTED NOT DETECTED Final   Influenza B NOT DETECTED NOT DETECTED Final   Parainfluenza Virus 1 NOT DETECTED NOT DETECTED Final   Parainfluenza Virus 2 NOT DETECTED NOT DETECTED Final   Parainfluenza Virus 3 NOT DETECTED NOT DETECTED Final   Parainfluenza Virus 4 NOT DETECTED NOT DETECTED Final   Respiratory Syncytial Virus NOT DETECTED NOT DETECTED Final   Bordetella pertussis NOT DETECTED NOT DETECTED Final   Bordetella Parapertussis NOT DETECTED NOT DETECTED Final   Chlamydophila pneumoniae NOT DETECTED NOT DETECTED Final   Mycoplasma pneumoniae NOT  DETECTED NOT DETECTED Final    Comment: Performed at [facility], [address], [place], [place] [address]         Radiology Studies: No results found.  Scheduled Meds:  apixaban   5 mg Oral BID   atorvastatin   20 mg Oral QHS   budesonide   0.25 mg Nebulization BID   feeding supplement  237 mL Oral BID BM   furosemide   40 mg Intravenous BID   levothyroxine   200 mcg Oral Q0600   methylPREDNISolone  (SOLU-MEDROL ) injection  60 mg Intravenous Daily   metoprolol  succinate  12.5 mg Oral Daily   pantoprazole   40 mg Oral Q breakfast   polyethylene glycol  34 g Oral Daily   sodium chloride  flush  3 mL Intravenous Q12H   Continuous Infusions:     LOS: 5 days    Brayton Lye, MD [facility]   To contact the attending provider between 7A-7P or the covering provider during after hours 7P-7A, please log into the web site [email or web address] and access using universal [place] password for that web site. If you do not have the password, please call the hospital operator.  01/17/2024, 11:58 AM

## 2024-01-17 NOTE — Plan of Care (Signed)
   Problem: Education: Goal: Knowledge of General Education information will improve Description Including pain rating scale, medication(s)/side effects and non-pharmacologic comfort measures Outcome: Progressing   Problem: Health Behavior/Discharge Planning: Goal: Ability to manage health-related needs will improve Outcome: Progressing

## 2024-01-17 NOTE — Progress Notes (Signed)
 Patient refused to get oob to chair x2 today. Patient states she will get oob tomorrow.

## 2024-01-18 DIAGNOSIS — J9621 Acute and chronic respiratory failure with hypoxia: Secondary | ICD-10-CM | POA: Diagnosis not present

## 2024-01-18 LAB — BASIC METABOLIC PANEL WITH GFR
Anion gap: 8 (ref 5–15)
BUN: 44 mg/dL — ABNORMAL HIGH (ref 8–23)
CO2: 36 mmol/L — ABNORMAL HIGH (ref 22–32)
Calcium: 8.5 mg/dL — ABNORMAL LOW (ref 8.9–10.3)
Chloride: 92 mmol/L — ABNORMAL LOW (ref 98–111)
Creatinine, Ser: 1.32 mg/dL — ABNORMAL HIGH (ref 0.44–1.00)
GFR, Estimated: 42 mL/min — ABNORMAL LOW (ref >60)
Glucose, Bld: 219 mg/dL — ABNORMAL HIGH (ref 70–99)
Potassium: 3.6 mmol/L (ref 3.5–5.1)
Sodium: 136 mmol/L (ref 135–145)

## 2024-01-18 LAB — CBC
HCT: 35.1 % — ABNORMAL LOW (ref 36.0–46.0)
Hemoglobin: 10.6 g/dL — ABNORMAL LOW (ref 12.0–15.0)
MCH: 29.4 pg (ref 26.0–34.0)
MCHC: 30.2 g/dL (ref 30.0–36.0)
MCV: 97.5 fL (ref 80.0–100.0)
Platelets: 266 K/uL (ref 150–400)
RBC: 3.6 MIL/uL — ABNORMAL LOW (ref 3.87–5.11)
RDW: 15.3 % (ref 11.5–15.5)
WBC: 7.8 K/uL (ref 4.0–10.5)
nRBC: 0 % (ref 0.0–0.2)

## 2024-01-18 MED ORDER — BISACODYL 5 MG PO TBEC
10.0000 mg | DELAYED_RELEASE_TABLET | Freq: Once | ORAL | Status: AC
  Administered 2024-01-18: 10 mg via ORAL
  Filled 2024-01-18: qty 2

## 2024-01-18 MED ORDER — FUROSEMIDE 40 MG PO TABS
40.0000 mg | ORAL_TABLET | Freq: Two times a day (BID) | ORAL | Status: DC
  Administered 2024-01-18 – 2024-01-20 (×4): 40 mg via ORAL
  Filled 2024-01-18 (×4): qty 1

## 2024-01-18 NOTE — Plan of Care (Signed)
 Pt continues to have limited ROM and weakness secondary to SOB and obesity.

## 2024-01-18 NOTE — TOC Progression Note (Addendum)
 Transition of Care [facility]) - Progression Note    Patient Details  Name: Glenda [facility]one MRN: [id number] Date of Birth: 02/15/1951  [facility] of [facility]) CM/SW Contact  Inocente GORMAN Kindle, LCSW Phone Number: 01/18/2024, 9:46 AM  Clinical Narrative:    9:46am-CSW spoke with patient's sister who confirmed she prefers patient to return to [place] with Hospice. CSW requested SNF order Bipap and will ask which Hospice agency they contract with.    12:33pm-CSW left voicemail for [facility] with [facility] (contracted with [facility], [phone]).   1:11 PM-CSW received call from [facility] with [facility] [phone]). She accepted referral and will contact patient's sister.   Expected Discharge Plan: Skilled Nursing Facility Barriers to Discharge: Continued Medical Work up               Expected Discharge Plan and Services In-house Referral: Clinical Social Work   Post Acute Care Choice: Skilled Nursing Facility Living arrangements for the past 2 months: Skilled Nursing Facility                                       Social Drivers of Health (SDOH) Interventions SDOH Screenings   Food Insecurity: Patient Unable To Answer (01/13/2024)  Housing: Patient Unable To Answer (01/13/2024)  Transportation Needs: Patient Unable To Answer (01/13/2024)  Utilities: Patient Unable To Answer (01/13/2024)  Social Connections: Patient Unable To Answer (01/13/2024)  Tobacco Use: High Risk (01/14/2024)    Readmission Risk Interventions    08/14/2023   12:38 PM 08/12/2023    2:14 PM  Readmission Risk Prevention Plan  Transportation Screening Complete Complete  PCP or Specialist Appt within 5-7 Days Complete Complete  Home Care Screening Complete Complete  Medication Review (RN CM) Complete Complete

## 2024-01-18 NOTE — Plan of Care (Signed)
  Problem: Education: Goal: Knowledge of General Education information will improve Description: Including pain rating scale, medication(s)/side effects and non-pharmacologic comfort measures Outcome: Progressing   Problem: Health Behavior/Discharge Planning: Goal: Ability to manage health-related needs will improve Outcome: Progressing   Problem: Clinical Measurements: Goal: Ability to maintain clinical measurements within normal limits will improve Outcome: Progressing Goal: Will remain free from infection Outcome: Progressing Goal: Diagnostic test results will improve Outcome: Progressing Goal: Respiratory complications will improve Outcome: Progressing Goal: Cardiovascular complication will be avoided Outcome: Progressing   Problem: Activity: Goal: Risk for activity intolerance will decrease Outcome: Progressing   Problem: Nutrition: Goal: Adequate nutrition will be maintained Outcome: Progressing   Problem: Coping: Goal: Level of anxiety will decrease Outcome: Progressing   Problem: Elimination: Goal: Will not experience complications related to bowel motility Outcome: Progressing Goal: Will not experience complications related to urinary retention Outcome: Progressing   Problem: Pain Managment: Goal: General experience of comfort will improve and/or be controlled Outcome: Progressing   Problem: Safety: Goal: Ability to remain free from injury will improve Outcome: Progressing   Problem: Skin Integrity: Goal: Risk for impaired skin integrity will decrease Outcome: Progressing   Problem: Education: Goal: Ability to demonstrate management of disease process will improve Outcome: Progressing Goal: Ability to verbalize understanding of medication therapies will improve Outcome: Progressing Goal: Individualized Educational Video(s) Outcome: Progressing   Problem: Activity: Goal: Capacity to carry out activities will improve Outcome: Progressing    Problem: Cardiac: Goal: Ability to achieve and maintain adequate cardiopulmonary perfusion will improve Outcome: Progressing   Problem: Education: Goal: Knowledge of disease or condition will improve Outcome: Progressing Goal: Knowledge of the prescribed therapeutic regimen will improve Outcome: Progressing Goal: Individualized Educational Video(s) Outcome: Progressing   Problem: Activity: Goal: Ability to tolerate increased activity will improve Outcome: Progressing Goal: Will verbalize the importance of balancing activity with adequate rest periods Outcome: Progressing   Problem: Respiratory: Goal: Ability to maintain a clear airway will improve Outcome: Progressing Goal: Levels of oxygenation will improve Outcome: Progressing Goal: Ability to maintain adequate ventilation will improve Outcome: Progressing   Problem: Activity: Goal: Ability to tolerate increased activity will improve Outcome: Progressing   Problem: Clinical Measurements: Goal: Ability to maintain a body temperature in the normal range will improve Outcome: Progressing   Problem: Respiratory: Goal: Ability to maintain adequate ventilation will improve Outcome: Progressing Goal: Ability to maintain a clear airway will improve Outcome: Progressing

## 2024-01-18 NOTE — Progress Notes (Signed)
 PROGRESS NOTE    Glenda [facility]  FMW:[id number] DOB: 04-19-1951 DOA: 01/12/2024 PCP: Feliciano Devoria LABOR, MD  Chief Complaint  Patient presents with   Respiratory Distress    Brief Narrative:   Glenda [facility] is a 73 y.o. female with medical history significant of hypertension, hyperlipidemia, diabetes, TIA, atrial fibrillation, hypothyroidism, chronic diastolic CHF, anxiety, depression, obesity, chronic respiratory failure with hypoxia on 2-4 L, chronic bronchitis, COPD, neuropathy presenting with respiratory distress and lethargy from SNF.  Workup significant for respiratory distress and hypercapnia where she was started on BiPAP.   Assessment & Plan:   Principal Problem:   Acute on chronic respiratory failure with hypoxia (HCC) Active Problems:   Anxiety, generalized   Neuropathy   History of TIA (transient ischemic attack)   Essential hypertension   Obesity, Class III, BMI 40-49.9 (morbid obesity)   Type 2 diabetes mellitus with hyperlipidemia (HCC)   Hypothyroidism   COPD exacerbation (HCC)   Chronic atrial fibrillation (HCC)  Acute on chronic respiratory failure with hypoxia and hypercapnia  Acute exacerbation of COPD Acute on chronic diastolic CHF -Patient with significant respiratory distress, increased work of breathing and lethargy secondary to CO2 retention -She is on BiPAP, vent setting adjusted PCCM input greatly appreciated.  Battery status much improved, will keep on BiPAP as needed, and bedtime for now and try to wean off gradually - Improving, changed IV Solu-Medrol  to p.o. prednisone , will need prolonged taper.   - continue scheduled bronchodilators. -COVID/RSV/flu  is negative -Respiratory viral panel is negative - Just FiO2 to keep oxygen saturation between 90 and 93% - Received another dose of Diamox  over the last 24 hours. - Improving with IV diuresis, will switch to p.o. Lasix  40 mg p.o. twice daily today.   -high risk for intubation, PCCM consulted,  patient currently DNR/DNI - Patient has improved, for now she does not require BiPAP daytime, only at nighttime, she will benefit from BiPAP at [facility] given her OHS, and chronic COPD and CO2 retention, will have social worker reach to her SNF facility to start arrangement for BiPAP at bedtime.     Acute encephalopathy due to hypercapnia - She is more awake  Klebsiella pneumonia UTI - Treated with with Rocephin    Hypertension - Continue with metoprolol    Hyperlipidemia - Resume atorvastatin  stable   Hypothyroidism - Continue [facility] Synthroid    Atrial fibrillation - Continue with metoprolol  - Continue [facility] Eliquis     History of TIA - Continue atorvastatin  and Eliquis  when able   Anxiety Depression - Resume bupropion  when able   Morbid obesity - Body mass index is 56.12 kg/m.  CKD stage IIIb - Monitor closely as diuresis  Hyperkalemia - Resolved  Hypokalemia - Replaced  Hypomagnesemia - Replaced  Hypernatremia - Resolved  Macrocytic anemia B12 deficiency - Low B12 level noted in March, will start on supplement  DVT prophylaxis: Eliquis  Code Status: limited. Family Communication:  Discussed with grandson at bedside yesterday Disposition:   Status is: Inpatient    Consultants:  PCCM Palliative medicine   Subjective:  No significant events overnight, she denies any complaints today, last BM was before yesterday  Objective: Vitals:   01/18/24 0500 01/18/24 0544 01/18/24 0749 01/18/24 0800  BP:    (!) 132/59  Pulse:  60  (!) 58  Resp:  20  (!) 21  Temp:    98.3 F (36.8 C)  TempSrc:    Oral  SpO2:  98% 93% 93%  Weight: (!) 148.3 kg  Intake/Output Summary (Last 24 hours) at 01/18/2024 0926 Last data filed at 01/18/2024 9487 Gross per 24 hour  Intake 893 ml  Output 3800 ml  Net -2907 ml   Filed Weights   01/16/24 0500 01/17/24 0500 01/18/24 0500  Weight: (!) 151.7 kg (!) 152 kg (!) 148.3 kg    Examination:  Well-appearing,  frail, awake and appropriate, she is with morbid obesity  Improved air entry bilaterally, no wheezing Abdomen soft Regular rate and rhythm Lower extremity with no edema     Data Reviewed: I have personally reviewed following labs and imaging studies  CBC: Recent Labs  Lab 01/12/24 1023 01/12/24 1036 01/14/24 0413 01/15/24 0531 01/16/24 0338 01/17/24 0521 01/18/24 0600  WBC 8.0   < > 13.7* 11.9* 9.0 7.7 7.8  NEUTROABS 6.1  --   --   --   --   --   --   HGB 11.2*   < > 9.0* 9.5* 9.6* 10.1* 10.6*  HCT 40.8   < > 31.1* 31.4* 33.0* 33.3* 35.1*  MCV 108.8*   < > 103.3* 99.7 102.2* 96.8 97.5  PLT 221   < > 218 230 223 235 266   < > = values in this interval not displayed.    Basic Metabolic Panel: Recent Labs  Lab 01/14/24 0413 01/15/24 0531 01/16/24 0338 01/17/24 0521 01/18/24 0600  NA 147* 141 139 136 136  K 3.6 3.4* 4.0 3.9 3.6  CL 91* 95* 97* 92* 92*  CO2 39* 36* 35* 32 36*  GLUCOSE 147* 149* 146* 176* 219*  BUN 37* 39* 42* 42* 44*  CREATININE 1.48* 1.47* 1.47* 1.24* 1.32*  CALCIUM  8.2* 8.4* 8.0* 8.5* 8.5*  MG 2.4 2.3  --   --   --     GFR: Estimated Creatinine Clearance: 54.4 mL/min (A) (by C-G formula based on SCr of 1.32 mg/dL (H)).  Liver Function Tests: Recent Labs  Lab 01/13/24 0435 01/14/24 0413 01/15/24 0531 01/16/24 0338 01/17/24 0521  AST 22 18 17 29 21   ALT 13 17 18 29  32  ALKPHOS 86 68 64 65 65  BILITOT 0.7 0.8 0.5 0.4 0.4  PROT 7.2 6.4* 6.8 6.3* 6.7  ALBUMIN 3.0* 2.8* 2.6* 2.4* 2.6*    CBG: Recent Labs  Lab 01/13/24 0456  GLUCAP 207*     Recent Results (from the past 240 hours)  Resp panel by RT-PCR (RSV, Flu A&B, Covid) Anterior Nasal Swab     Status: None   Collection Time: 01/12/24 12:52 PM   Specimen: Anterior Nasal Swab  Result Value Ref Range Status   SARS Coronavirus 2 by RT PCR NEGATIVE NEGATIVE Final   Influenza A by PCR NEGATIVE NEGATIVE Final   Influenza B by PCR NEGATIVE NEGATIVE Final    Comment: (NOTE) The  Xpert Xpress SARS-CoV-2/FLU/RSV plus assay is intended as an aid in the diagnosis of influenza from Nasopharyngeal swab specimens and should not be used as a sole basis for treatment. Nasal washings and aspirates are unacceptable for Xpert Xpress SARS-CoV-2/FLU/RSV testing.  Fact Sheet for Patients: BloggerCourse.com  Fact Sheet for Healthcare Providers: SeriousBroker.it  This test is not yet approved or cleared by the [place]  FDA and has been authorized for detection and/or diagnosis of SARS-CoV-2 by FDA under an Emergency Use Authorization (EUA). This EUA will remain in effect (meaning this test can be used) for the duration of the COVID-19 declaration under Section 564(b)(1) of the Act, 21 U.S.C. section 360bbb-3(b)(1), unless the authorization is terminated  or revoked.     Resp Syncytial Virus by PCR NEGATIVE NEGATIVE Final    Comment: (NOTE) Fact Sheet for Patients: BloggerCourse.com  Fact Sheet for Healthcare Providers: SeriousBroker.it  This test is not yet approved or cleared by the [place]  FDA and has been authorized for detection and/or diagnosis of SARS-CoV-2 by FDA under an Emergency Use Authorization (EUA). This EUA will remain in effect (meaning this test can be used) for the duration of the COVID-19 declaration under Section 564(b)(1) of the Act, 21 U.S.C. section 360bbb-3(b)(1), unless the authorization is terminated or revoked.  Performed at [facility], [address] [place], [place] [address]   Urine Culture     Status: Abnormal   Collection Time: 01/12/24 12:52 PM   Specimen: Urine, Clean Catch  Result Value Ref Range Status   Specimen Description URINE, CLEAN CATCH  Final   Special Requests   Final    NONE Performed at [facility], [address], [place], [place] [address]    Culture >=100,000 COLONIES/mL KLEBSIELLA  PNEUMONIAE (A)  Final   Report Status 01/14/2024 FINAL  Final   Organism ID, Bacteria KLEBSIELLA PNEUMONIAE (A)  Final      Susceptibility   Klebsiella pneumoniae - MIC*    AMPICILLIN  RESISTANT Resistant     CEFAZOLIN (URINE) Value in next row Sensitive      2 SENSITIVEThis is a modified FDA-approved test that has been validated and its performance characteristics determined by the reporting laboratory.  This laboratory is certified under the Clinical Laboratory Improvement Amendments CLIA as qualified to perform high complexity clinical laboratory testing.    CEFEPIME Value in next row Sensitive      2 SENSITIVEThis is a modified FDA-approved test that has been validated and its performance characteristics determined by the reporting laboratory.  This laboratory is certified under the Clinical Laboratory Improvement Amendments CLIA as qualified to perform high complexity clinical laboratory testing.    ERTAPENEM Value in next row Sensitive      2 SENSITIVEThis is a modified FDA-approved test that has been validated and its performance characteristics determined by the reporting laboratory.  This laboratory is certified under the Clinical Laboratory Improvement Amendments CLIA as qualified to perform high complexity clinical laboratory testing.    CEFTRIAXONE  Value in next row Sensitive      2 SENSITIVEThis is a modified FDA-approved test that has been validated and its performance characteristics determined by the reporting laboratory.  This laboratory is certified under the Clinical Laboratory Improvement Amendments CLIA as qualified to perform high complexity clinical laboratory testing.    CIPROFLOXACIN Value in next row Sensitive      2 SENSITIVEThis is a modified FDA-approved test that has been validated and its performance characteristics determined by the reporting laboratory.  This laboratory is certified under the Clinical Laboratory Improvement Amendments CLIA as qualified to perform high  complexity clinical laboratory testing.    GENTAMICIN Value in next row Sensitive      2 SENSITIVEThis is a modified FDA-approved test that has been validated and its performance characteristics determined by the reporting laboratory.  This laboratory is certified under the Clinical Laboratory Improvement Amendments CLIA as qualified to perform high complexity clinical laboratory testing.    NITROFURANTOIN Value in next row Intermediate      2 SENSITIVEThis is a modified FDA-approved test that has been validated and its performance characteristics determined by the reporting laboratory.  This laboratory is certified under the Clinical Laboratory Improvement  Amendments CLIA as qualified to perform high complexity clinical laboratory testing.    TRIMETH /SULFA  Value in next row Sensitive      2 SENSITIVEThis is a modified FDA-approved test that has been validated and its performance characteristics determined by the reporting laboratory.  This laboratory is certified under the Clinical Laboratory Improvement Amendments CLIA as qualified to perform high complexity clinical laboratory testing.    AMPICILLIN /SULBACTAM Value in next row Sensitive      2 SENSITIVEThis is a modified FDA-approved test that has been validated and its performance characteristics determined by the reporting laboratory.  This laboratory is certified under the Clinical Laboratory Improvement Amendments CLIA as qualified to perform high complexity clinical laboratory testing.    PIP/TAZO Value in next row Sensitive ug/mL     <=4 SENSITIVEThis is a modified FDA-approved test that has been validated and its performance characteristics determined by the reporting laboratory.  This laboratory is certified under the Clinical Laboratory Improvement Amendments CLIA as qualified to perform high complexity clinical laboratory testing.    MEROPENEM Value in next row Sensitive      <=4 SENSITIVEThis is a modified FDA-approved test that has been  validated and its performance characteristics determined by the reporting laboratory.  This laboratory is certified under the Clinical Laboratory Improvement Amendments CLIA as qualified to perform high complexity clinical laboratory testing.    * >=100,000 COLONIES/mL KLEBSIELLA PNEUMONIAE  Respiratory (~20 pathogens) panel by PCR     Status: None   Collection Time: 01/12/24 12:52 PM   Specimen: Nasopharyngeal Swab; Respiratory  Result Value Ref Range Status   Adenovirus NOT DETECTED NOT DETECTED Final   Coronavirus 229E NOT DETECTED NOT DETECTED Final    Comment: (NOTE) The Coronavirus on the Respiratory Panel, DOES NOT test for the novel  Coronavirus (2019 nCoV)    Coronavirus HKU1 NOT DETECTED NOT DETECTED Final   Coronavirus NL63 NOT DETECTED NOT DETECTED Final   Coronavirus OC43 NOT DETECTED NOT DETECTED Final   Metapneumovirus NOT DETECTED NOT DETECTED Final   Rhinovirus / Enterovirus NOT DETECTED NOT DETECTED Final   Influenza A NOT DETECTED NOT DETECTED Final   Influenza B NOT DETECTED NOT DETECTED Final   Parainfluenza Virus 1 NOT DETECTED NOT DETECTED Final   Parainfluenza Virus 2 NOT DETECTED NOT DETECTED Final   Parainfluenza Virus 3 NOT DETECTED NOT DETECTED Final   Parainfluenza Virus 4 NOT DETECTED NOT DETECTED Final   Respiratory Syncytial Virus NOT DETECTED NOT DETECTED Final   Bordetella pertussis NOT DETECTED NOT DETECTED Final   Bordetella Parapertussis NOT DETECTED NOT DETECTED Final   Chlamydophila pneumoniae NOT DETECTED NOT DETECTED Final   Mycoplasma pneumoniae NOT DETECTED NOT DETECTED Final    Comment: Performed at [facility], [address] [place], [place] [address]         Radiology Studies: No results found.       Scheduled Meds:  acetaZOLAMIDE   250 mg Oral TID   apixaban   5 mg Oral BID   atorvastatin   20 mg Oral QHS   budesonide   0.25 mg Nebulization BID   buPROPion   75 mg Oral Daily   cyanocobalamin   1,000 mcg Oral Weekly    feeding supplement  237 mL Oral BID BM   furosemide   40 mg Intravenous BID   levothyroxine   200 mcg Oral Q0600   metoprolol  succinate  12.5 mg Oral Daily   pantoprazole   40 mg Oral Q breakfast   polyethylene glycol  34 g Oral Daily  predniSONE   40 mg Oral Q breakfast   sodium chloride  flush  3 mL Intravenous Q12H   Continuous Infusions:     LOS: 6 days    Brayton Lye, MD [facility]   To contact the attending provider between 7A-7P or the covering provider during after hours 7P-7A, please log into the web site [email or web address] and access using universal [place] password for that web site. If you do not have the password, please call the hospital operator.  01/18/2024, 9:26 AM

## 2024-01-18 NOTE — Progress Notes (Signed)
 RT NOTE:   MD asked RT to place patient bipap settings from last night in patient progress note.   18/8 40% R22.

## 2024-01-19 DIAGNOSIS — J9621 Acute and chronic respiratory failure with hypoxia: Secondary | ICD-10-CM | POA: Diagnosis not present

## 2024-01-19 MED ORDER — BUSPIRONE HCL 5 MG PO TABS
7.5000 mg | ORAL_TABLET | Freq: Two times a day (BID) | ORAL | Status: DC
  Administered 2024-01-19 – 2024-01-20 (×2): 7.5 mg via ORAL
  Filled 2024-01-19 (×3): qty 2

## 2024-01-19 MED ORDER — LINAGLIPTIN 5 MG PO TABS
5.0000 mg | ORAL_TABLET | Freq: Every day | ORAL | Status: DC
  Administered 2024-01-19 – 2024-01-20 (×2): 5 mg via ORAL
  Filled 2024-01-19 (×3): qty 1

## 2024-01-19 MED ORDER — UMECLIDINIUM-VILANTEROL 62.5-25 MCG/ACT IN AEPB
1.0000 | INHALATION_SPRAY | Freq: Every day | RESPIRATORY_TRACT | Status: DC
  Administered 2024-01-20: 1 via RESPIRATORY_TRACT
  Filled 2024-01-19: qty 14

## 2024-01-19 MED ORDER — MAGNESIUM CITRATE PO SOLN
1.0000 | Freq: Once | ORAL | Status: AC
  Administered 2024-01-19: 1 via ORAL
  Filled 2024-01-19: qty 296

## 2024-01-19 MED ORDER — METOPROLOL SUCCINATE ER 25 MG PO TB24
25.0000 mg | ORAL_TABLET | Freq: Every day | ORAL | Status: DC
  Administered 2024-01-19 – 2024-01-20 (×2): 25 mg via ORAL
  Filled 2024-01-19 (×2): qty 1

## 2024-01-19 NOTE — Progress Notes (Signed)
 PROGRESS NOTE    Glenda [facility]one  FMW:[id number] DOB: 12-18-1950 DOA: 01/12/2024 PCP: Feliciano Devoria LABOR, MD  Chief Complaint  Patient presents with   Respiratory Distress    Brief Narrative:   Glenda [facility]one is a 73 y.o. female with medical history significant of hypertension, hyperlipidemia, diabetes, TIA, atrial fibrillation, hypothyroidism, chronic diastolic CHF, anxiety, depression, obesity, chronic respiratory failure with hypoxia on 2-4 L, chronic bronchitis, COPD, neuropathy presenting with respiratory distress and lethargy from SNF.  Workup significant for respiratory distress and hypercapnia where she was started on BiPAP.   Assessment & Plan:   Principal Problem:   Acute on chronic respiratory failure with hypoxia (HCC) Active Problems:   Anxiety, generalized   Neuropathy   History of TIA (transient ischemic attack)   Essential hypertension   Obesity, Class III, BMI 40-49.9 (morbid obesity)   Type 2 diabetes mellitus with hyperlipidemia (HCC)   Hypothyroidism   COPD exacerbation (HCC)   Chronic atrial fibrillation (HCC)  Acute on chronic respiratory failure with hypoxia and hypercapnia  Acute exacerbation of COPD Acute on chronic diastolic CHF -Patient with significant respiratory distress, increased work of breathing and lethargy secondary to CO2 retention -She is on BiPAP, vent setting adjusted PCCM input greatly appreciated.  Battery status much improved, will keep on BiPAP as needed, and bedtime for now and try to wean off gradually - Improving, changed IV Solu-Medrol  to p.o. prednisone , will need prolonged taper.   - continue scheduled bronchodilators. -COVID/RSV/flu  is negative -Respiratory viral panel is negative - Just FiO2 to keep oxygen saturation between 90 and 93% - Received another dose of Diamox  over the last 24 hours. - Improving with IV diuresis, will switch to p.o. Lasix  40 mg p.o. twice daily today.   -high risk for intubation, PCCM consulted,  patient currently DNR/DNI - Patient has improved, for now she does not require BiPAP daytime, only at nighttime, she will benefit from BiPAP at [facility] given her OHS, and chronic COPD and CO2 retention, social worker reached out to the facility, who will arrange for BiPAP.  Acute encephalopathy due to hypercapnia - She is more awake  Klebsiella pneumonia UTI - Treated with with Rocephin    Hypertension - Continue with metoprolol    Hyperlipidemia - Resume atorvastatin  stable   Hypothyroidism - Continue [facility] Synthroid    Atrial fibrillation - Continue with metoprolol , heart rate mildly increased this morning so I will will increase her Toprol -XL from 12.5 to 25 mg daily. - Continue [facility] Eliquis     History of TIA - Continue atorvastatin  and Eliquis  when able   Anxiety Depression - Resume bupropion  when able   Morbid obesity - Body mass index is 56.12 kg/m.  CKD stage IIIb - Monitor closely as diuresis  Hyperkalemia - Resolved  Hypokalemia - Replaced  Hypomagnesemia - Replaced  Hypernatremia - Resolved  Macrocytic anemia B12 deficiency - Low B12 level noted in March, will start on supplement  DVT prophylaxis: Eliquis  Code Status: limited. Family Communication:  None at bedside today Disposition: Discharge to SNF once BiPAP is arranged by the facility  Status is: Inpatient    Consultants:  PCCM Palliative medicine   Subjective:  No significant events overnight, she was able to tolerate BiPAP, no bowel movement over the last 3 days Objective: Vitals:   01/19/24 0400 01/19/24 0738 01/19/24 0759 01/19/24 1109  BP:   101/67 90/61  Pulse:   94   Resp:   17   Temp: 97.8 F (36.6 C) 97.7 F (36.5  C)  97.8 F (36.6 C)  TempSrc: Oral Oral  Oral  SpO2:   92%   Weight:       No intake or output data in the 24 hours ending 01/19/24 1156  Filed Weights   01/16/24 0500 01/17/24 0500 01/18/24 0500  Weight: (!) 151.7 kg (!) 152 kg (!) 148.3 kg     Examination:  Well-appearing, frail, awake and appropriate, she is with morbid obesity  Improved air entry bilaterally, no wheezing Regular heart regular, mildly tachycardic Abdomen soft No edema    Data Reviewed: I have personally reviewed following labs and imaging studies  CBC: Recent Labs  Lab 01/14/24 0413 01/15/24 0531 01/16/24 0338 01/17/24 0521 01/18/24 0600  WBC 13.7* 11.9* 9.0 7.7 7.8  HGB 9.0* 9.5* 9.6* 10.1* 10.6*  HCT 31.1* 31.4* 33.0* 33.3* 35.1*  MCV 103.3* 99.7 102.2* 96.8 97.5  PLT 218 230 223 235 266    Basic Metabolic Panel: Recent Labs  Lab 01/14/24 0413 01/15/24 0531 01/16/24 0338 01/17/24 0521 01/18/24 0600  NA 147* 141 139 136 136  K 3.6 3.4* 4.0 3.9 3.6  CL 91* 95* 97* 92* 92*  CO2 39* 36* 35* 32 36*  GLUCOSE 147* 149* 146* 176* 219*  BUN 37* 39* 42* 42* 44*  CREATININE 1.48* 1.47* 1.47* 1.24* 1.32*  CALCIUM  8.2* 8.4* 8.0* 8.5* 8.5*  MG 2.4 2.3  --   --   --     GFR: Estimated Creatinine Clearance: 54.4 mL/min (A) (by C-G formula based on SCr of 1.32 mg/dL (H)).  Liver Function Tests: Recent Labs  Lab 01/13/24 0435 01/14/24 0413 01/15/24 0531 01/16/24 0338 01/17/24 0521  AST 22 18 17 29 21   ALT 13 17 18 29  32  ALKPHOS 86 68 64 65 65  BILITOT 0.7 0.8 0.5 0.4 0.4  PROT 7.2 6.4* 6.8 6.3* 6.7  ALBUMIN 3.0* 2.8* 2.6* 2.4* 2.6*    CBG: Recent Labs  Lab 01/13/24 0456  GLUCAP 207*     Recent Results (from the past 240 hours)  Resp panel by RT-PCR (RSV, Flu A&B, Covid) Anterior Nasal Swab     Status: None   Collection Time: 01/12/24 12:52 PM   Specimen: Anterior Nasal Swab  Result Value Ref Range Status   SARS Coronavirus 2 by RT PCR NEGATIVE NEGATIVE Final   Influenza A by PCR NEGATIVE NEGATIVE Final   Influenza B by PCR NEGATIVE NEGATIVE Final    Comment: (NOTE) The Xpert Xpress SARS-CoV-2/FLU/RSV plus assay is intended as an aid in the diagnosis of influenza from Nasopharyngeal swab specimens and should not be  used as a sole basis for treatment. Nasal washings and aspirates are unacceptable for Xpert Xpress SARS-CoV-2/FLU/RSV testing.  Fact Sheet for Patients: BloggerCourse.com  Fact Sheet for Healthcare Providers: SeriousBroker.it  This test is not yet approved or cleared by the [place]  FDA and has been authorized for detection and/or diagnosis of SARS-CoV-2 by FDA under an Emergency Use Authorization (EUA). This EUA will remain in effect (meaning this test can be used) for the duration of the COVID-19 declaration under Section 564(b)(1) of the Act, 21 U.S.C. section 360bbb-3(b)(1), unless the authorization is terminated or revoked.     Resp Syncytial Virus by PCR NEGATIVE NEGATIVE Final    Comment: (NOTE) Fact Sheet for Patients: BloggerCourse.com  Fact Sheet for Healthcare Providers: SeriousBroker.it  This test is not yet approved or cleared by the [place]  FDA and has been authorized for detection and/or diagnosis of SARS-CoV-2 by FDA  under an Emergency Use Authorization (EUA). This EUA will remain in effect (meaning this test can be used) for the duration of the COVID-19 declaration under Section 564(b)(1) of the Act, 21 U.S.C. section 360bbb-3(b)(1), unless the authorization is terminated or revoked.  Performed at [facility], [address], [place], [place] [address]   Urine Culture     Status: Abnormal   Collection Time: 01/12/24 12:52 PM   Specimen: Urine, Clean Catch  Result Value Ref Range Status   Specimen Description URINE, CLEAN CATCH  Final   Special Requests   Final    NONE Performed at [facility], [address], [place], [place] [address]    Culture >=100,000 COLONIES/mL KLEBSIELLA PNEUMONIAE (A)  Final   Report Status 01/14/2024 FINAL  Final   Organism ID, Bacteria KLEBSIELLA PNEUMONIAE (A)  Final      Susceptibility   Klebsiella  pneumoniae - MIC*    AMPICILLIN  RESISTANT Resistant     CEFAZOLIN (URINE) Value in next row Sensitive      2 SENSITIVEThis is a modified FDA-approved test that has been validated and its performance characteristics determined by the reporting laboratory.  This laboratory is certified under the Clinical Laboratory Improvement Amendments CLIA as qualified to perform high complexity clinical laboratory testing.    CEFEPIME Value in next row Sensitive      2 SENSITIVEThis is a modified FDA-approved test that has been validated and its performance characteristics determined by the reporting laboratory.  This laboratory is certified under the Clinical Laboratory Improvement Amendments CLIA as qualified to perform high complexity clinical laboratory testing.    ERTAPENEM Value in next row Sensitive      2 SENSITIVEThis is a modified FDA-approved test that has been validated and its performance characteristics determined by the reporting laboratory.  This laboratory is certified under the Clinical Laboratory Improvement Amendments CLIA as qualified to perform high complexity clinical laboratory testing.    CEFTRIAXONE  Value in next row Sensitive      2 SENSITIVEThis is a modified FDA-approved test that has been validated and its performance characteristics determined by the reporting laboratory.  This laboratory is certified under the Clinical Laboratory Improvement Amendments CLIA as qualified to perform high complexity clinical laboratory testing.    CIPROFLOXACIN Value in next row Sensitive      2 SENSITIVEThis is a modified FDA-approved test that has been validated and its performance characteristics determined by the reporting laboratory.  This laboratory is certified under the Clinical Laboratory Improvement Amendments CLIA as qualified to perform high complexity clinical laboratory testing.    GENTAMICIN Value in next row Sensitive      2 SENSITIVEThis is a modified FDA-approved test that has been  validated and its performance characteristics determined by the reporting laboratory.  This laboratory is certified under the Clinical Laboratory Improvement Amendments CLIA as qualified to perform high complexity clinical laboratory testing.    NITROFURANTOIN Value in next row Intermediate      2 SENSITIVEThis is a modified FDA-approved test that has been validated and its performance characteristics determined by the reporting laboratory.  This laboratory is certified under the Clinical Laboratory Improvement Amendments CLIA as qualified to perform high complexity clinical laboratory testing.    TRIMETH /SULFA  Value in next row Sensitive      2 SENSITIVEThis is a modified FDA-approved test that has been validated and its performance characteristics determined by the reporting laboratory.  This laboratory is certified under the Clinical Laboratory Improvement Amendments CLIA  as qualified to perform high complexity clinical laboratory testing.    AMPICILLIN /SULBACTAM Value in next row Sensitive      2 SENSITIVEThis is a modified FDA-approved test that has been validated and its performance characteristics determined by the reporting laboratory.  This laboratory is certified under the Clinical Laboratory Improvement Amendments CLIA as qualified to perform high complexity clinical laboratory testing.    PIP/TAZO Value in next row Sensitive ug/mL     <=4 SENSITIVEThis is a modified FDA-approved test that has been validated and its performance characteristics determined by the reporting laboratory.  This laboratory is certified under the Clinical Laboratory Improvement Amendments CLIA as qualified to perform high complexity clinical laboratory testing.    MEROPENEM Value in next row Sensitive      <=4 SENSITIVEThis is a modified FDA-approved test that has been validated and its performance characteristics determined by the reporting laboratory.  This laboratory is certified under the Clinical Laboratory  Improvement Amendments CLIA as qualified to perform high complexity clinical laboratory testing.    * >=100,000 COLONIES/mL KLEBSIELLA PNEUMONIAE  Respiratory (~20 pathogens) panel by PCR     Status: None   Collection Time: 01/12/24 12:52 PM   Specimen: Nasopharyngeal Swab; Respiratory  Result Value Ref Range Status   Adenovirus NOT DETECTED NOT DETECTED Final   Coronavirus 229E NOT DETECTED NOT DETECTED Final    Comment: (NOTE) The Coronavirus on the Respiratory Panel, DOES NOT test for the novel  Coronavirus (2019 nCoV)    Coronavirus HKU1 NOT DETECTED NOT DETECTED Final   Coronavirus NL63 NOT DETECTED NOT DETECTED Final   Coronavirus OC43 NOT DETECTED NOT DETECTED Final   Metapneumovirus NOT DETECTED NOT DETECTED Final   Rhinovirus / Enterovirus NOT DETECTED NOT DETECTED Final   Influenza A NOT DETECTED NOT DETECTED Final   Influenza B NOT DETECTED NOT DETECTED Final   Parainfluenza Virus 1 NOT DETECTED NOT DETECTED Final   Parainfluenza Virus 2 NOT DETECTED NOT DETECTED Final   Parainfluenza Virus 3 NOT DETECTED NOT DETECTED Final   Parainfluenza Virus 4 NOT DETECTED NOT DETECTED Final   Respiratory Syncytial Virus NOT DETECTED NOT DETECTED Final   Bordetella pertussis NOT DETECTED NOT DETECTED Final   Bordetella Parapertussis NOT DETECTED NOT DETECTED Final   Chlamydophila pneumoniae NOT DETECTED NOT DETECTED Final   Mycoplasma pneumoniae NOT DETECTED NOT DETECTED Final    Comment: Performed at [facility], [address], [place], [place] [address]         Radiology Studies: No results found.       Scheduled Meds:  apixaban   5 mg Oral BID   atorvastatin   20 mg Oral QHS   budesonide   0.25 mg Nebulization BID   buPROPion   75 mg Oral Daily   cyanocobalamin   1,000 mcg Oral Weekly   feeding supplement  237 mL Oral BID BM   furosemide   40 mg Oral BID   levothyroxine   200 mcg Oral Q0600   metoprolol  succinate  25 mg Oral Daily   pantoprazole   40 mg Oral Q  breakfast   polyethylene glycol  34 g Oral Daily   predniSONE   40 mg Oral Q breakfast   sodium chloride  flush  3 mL Intravenous Q12H   Continuous Infusions:     LOS: 7 days    Brayton Lye, MD [facility]   To contact the attending provider between 7A-7P or the covering provider during after hours 7P-7A, please log into the web site [email or web address] and access using universal  [place] password for that web site. If you do not have the password, please call the hospital operator.  01/19/2024, 11:56 AM

## 2024-01-19 NOTE — Progress Notes (Signed)
 Physical Therapy Treatment Patient Details Name: Glenda [facility] MRN: [id number] DOB: 11-15-1950 Today's Date: 01/19/2024   History of Present Illness Pt is a 73 year old resident of [facility] who presented on 01/12/24 with lethargy, hypoxia ans swelling. Placed on bipap. Seizure like episode on 01/13/24. PMH: HTN, HLD, DM, TIA, afib, hypothyroidism, CHF, anxiety, depression, obesity, COPD on 2-4L O2, neuropathy.    PT Comments  Pt tolerated treatment well today. Pt today was able to transfer to chair via stand pivot with +2 Mod/Max A. No change in DC/DME recs at this time. PT will continue to follow.     If plan is discharge home, recommend the following: Two people to help with walking and/or transfers;A lot of help with bathing/dressing/bathroom;Assistance with cooking/housework;Direct supervision/assist for medications management;Direct supervision/assist for financial management;Assist for transportation;Help with stairs or ramp for entrance;Supervision due to cognitive status   Can travel by private vehicle     No  Equipment Recommendations  None recommended by PT    Recommendations for Other Services       Precautions / Restrictions Precautions Precautions: Fall Recall of Precautions/Restrictions: Intact Restrictions Weight Bearing Restrictions Per Provider Order: No     Mobility  Bed Mobility Overal bed mobility: Needs Assistance Bed Mobility: Supine to Sit     Supine to sit: Contact guard     General bed mobility comments: increased time however no physical assistance provided.    Transfers Overall transfer level: Needs assistance Equipment used: 2 person hand held assist Transfers: Sit to/from Stand, Bed to chair/wheelchair/BSC Sit to Stand: +2 physical assistance, Mod assist, From elevated surface Stand pivot transfers: +2 physical assistance, Max assist         General transfer comment: +2 Mod A to stand. +2 Max A to stand pivot into chair with HHA.     Ambulation/Gait               General Gait Details: Nonambulatory at baseline.   Stairs             Wheelchair Mobility     Tilt Bed    Modified Rankin (Stroke Patients Only)       Balance Overall balance assessment: Needs assistance Sitting-balance support: No upper extremity supported, Feet supported Sitting balance-Leahy Scale: Good     Standing balance support: Bilateral upper extremity supported, During functional activity Standing balance-Leahy Scale: Poor Standing balance comment: Reliant on therapist                            Communication Communication Communication: No apparent difficulties  Cognition Arousal: Alert Behavior During Therapy: Flat affect, Anxious   PT - Cognitive impairments: No apparent impairments                       PT - Cognition Comments: Noted to be very anxious with mobility Following commands: Impaired Following commands impaired: Follows one step commands with increased time    Cueing Cueing Techniques: Verbal cues, Tactile cues  Exercises      General Comments General comments (skin integrity, edema, etc.): VSS      Pertinent Vitals/Pain Pain Assessment Pain Assessment: No/denies pain    Home Living                          Prior Function            PT Goals (current goals  can now be found in the care plan section) Progress towards PT goals: Progressing toward goals    Frequency    Min 1X/week      PT Plan      Co-evaluation              AM-PAC PT 6 Clicks Mobility   Outcome Measure  Help needed turning from your back to your side while in a flat bed without using bedrails?: A Little Help needed moving from lying on your back to sitting on the side of a flat bed without using bedrails?: A Little Help needed moving to and from a bed to a chair (including a wheelchair)?: A Lot Help needed standing up from a chair using your arms (e.g., wheelchair  or bedside chair)?: A Lot Help needed to walk in hospital room?: Total Help needed climbing 3-5 steps with a railing? : Total 6 Click Score: 12    End of Session Equipment Utilized During Treatment: Oxygen Activity Tolerance: Patient tolerated treatment well Patient left: in chair;with call bell/phone within reach;with chair alarm set Nurse Communication: Mobility status;Need for lift equipment PT Visit Diagnosis: Other abnormalities of gait and mobility (R26.89)     Time: 8599-8578 PT Time Calculation (min) (ACUTE ONLY): 21 min  Charges:    $Therapeutic Activity: 8-22 mins PT General Charges $$ ACUTE PT VISIT: 1 Visit                     Sueellen NOVAK, PT, DPT Acute Rehab Services [phone]    Sienna Stonehocker 01/19/2024, 4:55 PM

## 2024-01-19 NOTE — Plan of Care (Signed)
  Problem: Education: Goal: Knowledge of General Education information will improve Description: Including pain rating scale, medication(s)/side effects and non-pharmacologic comfort measures Outcome: Progressing   Problem: Health Behavior/Discharge Planning: Goal: Ability to manage health-related needs will improve Outcome: Progressing   Problem: Clinical Measurements: Goal: Ability to maintain clinical measurements within normal limits will improve Outcome: Progressing Goal: Will remain free from infection Outcome: Progressing Goal: Diagnostic test results will improve Outcome: Progressing Goal: Respiratory complications will improve Outcome: Progressing Goal: Cardiovascular complication will be avoided Outcome: Progressing   Problem: Activity: Goal: Risk for activity intolerance will decrease Outcome: Progressing   Problem: Coping: Goal: Level of anxiety will decrease Outcome: Progressing   Problem: Elimination: Goal: Will not experience complications related to bowel motility Outcome: Progressing Goal: Will not experience complications related to urinary retention Outcome: Progressing   Problem: Pain Managment: Goal: General experience of comfort will improve and/or be controlled Outcome: Progressing   Problem: Safety: Goal: Ability to remain free from injury will improve Outcome: Progressing   Problem: Skin Integrity: Goal: Risk for impaired skin integrity will decrease Outcome: Progressing   Problem: Education: Goal: Ability to demonstrate management of disease process will improve Outcome: Progressing Goal: Ability to verbalize understanding of medication therapies will improve Outcome: Progressing Goal: Individualized Educational Video(s) Outcome: Progressing   Problem: Activity: Goal: Capacity to carry out activities will improve Outcome: Progressing   Problem: Cardiac: Goal: Ability to achieve and maintain adequate cardiopulmonary perfusion will  improve Outcome: Progressing   Problem: Education: Goal: Knowledge of disease or condition will improve Outcome: Progressing Goal: Knowledge of the prescribed therapeutic regimen will improve Outcome: Progressing Goal: Individualized Educational Video(s) Outcome: Progressing   Problem: Activity: Goal: Ability to tolerate increased activity will improve Outcome: Progressing Goal: Will verbalize the importance of balancing activity with adequate rest periods Outcome: Progressing   Problem: Respiratory: Goal: Ability to maintain a clear airway will improve Outcome: Progressing Goal: Levels of oxygenation will improve Outcome: Progressing Goal: Ability to maintain adequate ventilation will improve Outcome: Progressing

## 2024-01-19 NOTE — Progress Notes (Signed)
   01/19/24 1159  BiPAP/CPAP/SIPAP  BiPAP/CPAP/SIPAP Pt Type Adult  BiPAP/CPAP/SIPAP SERVO  Mask Type Full face mask  Set Rate 22 breaths/min  IPAP 18 cmH20  EPAP 8 cmH2O  FiO2 (%) 40 %   Pts nightly BiPAP settings

## 2024-01-19 NOTE — TOC Progression Note (Addendum)
 Transition of Care [facility]) - Progression Note    Patient Details  Name: Glenda [facility] MRN: [id number] Date of Birth: 03-Feb-1951  [facility] CM/SW Contact  Inocente GORMAN Kindle, LCSW Phone Number: 01/19/2024, 9:08 AM  Clinical Narrative:    9:08 AM-Per Leontine at [facility], the Bipap has not been delivered yet.   1:08 PM-[facility] requested a different order with settings on it for the Bipap. CSW provided from MD.    Expected Discharge Plan: Skilled Nursing Facility Barriers to Discharge: Equipment Delay               Expected Discharge Plan and Services In-house Referral: Clinical Social Work   Post Acute Care Choice: Skilled Nursing Facility Living arrangements for the past 2 months: Skilled Nursing Facility                                       Social Drivers of Health (SDOH) Interventions SDOH Screenings   Food Insecurity: Patient Unable To Answer (01/13/2024)  Housing: Patient Unable To Answer (01/13/2024)  Transportation Needs: Patient Unable To Answer (01/13/2024)  Utilities: Patient Unable To Answer (01/13/2024)  Social Connections: Patient Unable To Answer (01/13/2024)  Tobacco Use: High Risk (01/14/2024)    Readmission Risk Interventions    08/14/2023   12:38 PM 08/12/2023    2:14 PM  Readmission Risk Prevention Plan  Transportation Screening Complete Complete  PCP or Specialist Appt within 5-7 Days Complete Complete  Home Care Screening Complete Complete  Medication Review (RN CM) Complete Complete

## 2024-01-19 NOTE — Plan of Care (Signed)
  Problem: Education: Goal: Knowledge of General Education information will improve Description: Including pain rating scale, medication(s)/side effects and non-pharmacologic comfort measures Outcome: Progressing   Problem: Health Behavior/Discharge Planning: Goal: Ability to manage health-related needs will improve Outcome: Progressing   Problem: Clinical Measurements: Goal: Ability to maintain clinical measurements within normal limits will improve Outcome: Progressing Goal: Will remain free from infection Outcome: Progressing Goal: Diagnostic test results will improve Outcome: Progressing Goal: Respiratory complications will improve Outcome: Progressing Goal: Cardiovascular complication will be avoided Outcome: Progressing   Problem: Activity: Goal: Risk for activity intolerance will decrease Outcome: Progressing   Problem: Coping: Goal: Level of anxiety will decrease Outcome: Progressing   Problem: Elimination: Goal: Will not experience complications related to bowel motility Outcome: Progressing Goal: Will not experience complications related to urinary retention Outcome: Progressing   Problem: Pain Managment: Goal: General experience of comfort will improve and/or be controlled Outcome: Progressing   Problem: Safety: Goal: Ability to remain free from injury will improve Outcome: Progressing   Problem: Skin Integrity: Goal: Risk for impaired skin integrity will decrease Outcome: Progressing   Problem: Education: Goal: Ability to demonstrate management of disease process will improve Outcome: Progressing Goal: Ability to verbalize understanding of medication therapies will improve Outcome: Progressing Goal: Individualized Educational Video(s) Outcome: Progressing   Problem: Activity: Goal: Capacity to carry out activities will improve Outcome: Progressing   Problem: Cardiac: Goal: Ability to achieve and maintain adequate cardiopulmonary perfusion will  improve Outcome: Progressing   Problem: Education: Goal: Knowledge of disease or condition will improve Outcome: Progressing Goal: Knowledge of the prescribed therapeutic regimen will improve Outcome: Progressing Goal: Individualized Educational Video(s) Outcome: Progressing   Problem: Activity: Goal: Ability to tolerate increased activity will improve Outcome: Progressing Goal: Will verbalize the importance of balancing activity with adequate rest periods Outcome: Progressing   Problem: Respiratory: Goal: Ability to maintain a clear airway will improve Outcome: Progressing Goal: Levels of oxygenation will improve Outcome: Progressing Goal: Ability to maintain adequate ventilation will improve Outcome: Progressing   Problem: Activity: Goal: Ability to tolerate increased activity will improve Outcome: Progressing   Problem: Clinical Measurements: Goal: Ability to maintain a body temperature in the normal range will improve Outcome: Progressing   Problem: Respiratory: Goal: Ability to maintain adequate ventilation will improve Outcome: Progressing Goal: Ability to maintain a clear airway will improve Outcome: Progressing

## 2024-01-20 DIAGNOSIS — I482 Chronic atrial fibrillation, unspecified: Secondary | ICD-10-CM | POA: Diagnosis not present

## 2024-01-20 DIAGNOSIS — F411 Generalized anxiety disorder: Secondary | ICD-10-CM | POA: Diagnosis not present

## 2024-01-20 DIAGNOSIS — J9621 Acute and chronic respiratory failure with hypoxia: Secondary | ICD-10-CM | POA: Diagnosis not present

## 2024-01-20 DIAGNOSIS — J441 Chronic obstructive pulmonary disease with (acute) exacerbation: Secondary | ICD-10-CM | POA: Diagnosis not present

## 2024-01-20 MED ORDER — METOPROLOL SUCCINATE ER 25 MG PO TB24: 25.0000 mg | ORAL_TABLET | Freq: Every day | ORAL | Status: AC

## 2024-01-20 MED ORDER — PANTOPRAZOLE SODIUM 40 MG PO TBEC
40.0000 mg | DELAYED_RELEASE_TABLET | Freq: Every day | ORAL | Status: AC
Start: 2024-01-21 — End: ?

## 2024-01-20 MED ORDER — PREDNISONE 10 MG PO TABS
ORAL_TABLET | ORAL | Status: DC
Start: 2024-01-20 — End: 2024-03-28

## 2024-01-20 NOTE — TOC Transition Note (Signed)
 Transition of Care [facility]) - Discharge Note   Patient Details  Name: Glenda Boone MRN: [id number] Date of Birth: 1950-10-17  Transition of Care [facility]) CM/SW Contact:  Inocente GORMAN Kindle, LCSW Phone Number: 01/20/2024, 12:52 PM   Clinical Narrative:    Patient will DC to: [facility] SNF Anticipated DC date: 01/20/24 Family notified: Sister, Delon Transport by: [place]   Per MD patient ready for DC to [facility]. RN to call report prior to discharge [phone] room 407B). RN, patient, patient's family, and facility notified of DC. Discharge Summary and FL2 sent to facility. DC packet on chart including signed script. Ambulance transport requested for patient.   CSW will sign off for now as social work intervention is no longer needed. Please consult us  again if new needs arise.     Final next level of care: Skilled Nursing Facility Barriers to Discharge: Barriers Resolved   Patient Goals and CMS Choice Patient states their goals for this hospitalization and ongoing recovery are:: Recover          Discharge Placement   Existing PASRR number confirmed : 01/20/24          Patient chooses bed at: [facility] Patient to be transferred to facility by: [facility] Name of family member notified: Sister Patient and family notified of of transfer: 01/20/24  Discharge Plan and Services Additional resources added to the After Visit Summary for   In-house Referral: Clinical Social Work   Post Acute Care Choice: Skilled Nursing Facility                               Social Drivers of Health (SDOH) Interventions SDOH Screenings   Food Insecurity: Patient Unable To Answer (01/13/2024)  Housing: Patient Unable To Answer (01/13/2024)  Transportation Needs: Patient Unable To Answer (01/13/2024)  Utilities: Patient Unable To Answer (01/13/2024)  Social Connections: Patient Unable To Answer (01/13/2024)  Tobacco Use: High Risk (01/14/2024)     Readmission Risk Interventions     08/14/2023   12:38 PM 08/12/2023    2:14 PM  Readmission Risk Prevention Plan  Transportation Screening Complete Complete  PCP or Specialist Appt within 5-7 Days Complete Complete  Home Care Screening Complete Complete  Medication Review (RN CM) Complete Complete

## 2024-01-20 NOTE — Progress Notes (Signed)
 Occupational Therapy Treatment Patient Details Name: Glenda Boone MRN: [id number] DOB: [birth year] Today's Date: 01/20/2024   History of present illness Pt is a 73 year old resident of [facility] who presented on 01/12/24 with lethargy, hypoxia ans swelling. Placed on bipap. Seizure like episode on 01/13/24. PMH: HTN, HLD, DM, TIA, afib, hypothyroidism, CHF, anxiety, depression, obesity, COPD on 2-4L O2, neuropathy.   OT comments  Pt to return to SNF today. Reports discomfort on buttocks. Pt dry and assisted to reposition onto her R side to off load buttocks. Pt reports her w/c also gives her discomfort. Recommended pt ask for a new w/c cushion when she returns back to [facility]. Did not progress OOB with impending discharge.       If plan is discharge home, recommend the following:  Two people to help with walking and/or transfers;Two people to help with bathing/dressing/bathroom;Assistance with cooking/housework;Direct supervision/assist for medications management;Direct supervision/assist for financial management;Assist for transportation;Help with stairs or ramp for entrance   Equipment Recommendations  None recommended by OT    Recommendations for Other Services      Precautions / Restrictions Precautions Precautions: Fall Recall of Precautions/Restrictions: Intact Restrictions Weight Bearing Restrictions Per Provider Order: No       Mobility Bed Mobility Overal bed mobility: Needs Assistance Bed Mobility: Supine to Sit, Sit to Supine, Rolling Rolling: Min assist   Supine to sit: Contact guard Sit to supine: Mod assist   General bed mobility comments: assist for LEs back into bed, min assist to roll onto R side to place pillow for pressure relief    Transfers                   General transfer comment: deferred, pt with impending discharge     Balance Overall balance assessment: Needs assistance Sitting-balance support: No upper extremity supported,  Feet supported Sitting balance-Leahy Scale: Good                                     ADL either performed or assessed with clinical judgement   ADL Overall ADL's : Needs assistance/impaired     Grooming: Wash/dry hands;Wash/dry face;Brushing hair;Sitting;Supervision/safety               Lower Body Dressing: Total assistance;Bed level                      Extremity/Trunk Assessment              Vision       Perception     Praxis     Communication Communication Communication: No apparent difficulties   Cognition Arousal: Alert Behavior During Therapy: WFL for tasks assessed/performed Cognition: No apparent impairments                               Following commands: Intact        Cueing   Cueing Techniques: Verbal cues  Exercises      Shoulder Instructions       General Comments      Pertinent Vitals/ Pain       Pain Assessment Pain Assessment: Faces Faces Pain Scale: Hurts little more Pain Location: buttocks Pain Descriptors / Indicators: Sore Pain Intervention(s): Repositioned  Home Living  Prior Functioning/Environment              Frequency  Min 1X/week        Progress Toward Goals  OT Goals(current goals can now be found in the care plan section)  Progress towards OT goals: Progressing toward goals  Acute Rehab OT Goals OT Goal Formulation: With patient Time For Goal Achievement: 01/28/24 Potential to Achieve Goals: Fair  Plan      Co-evaluation                 AM-PAC OT 6 Clicks Daily Activity     Outcome Measure   Help from another person eating meals?: None Help from another person taking care of personal grooming?: A Little Help from another person toileting, which includes using toliet, bedpan, or urinal?: Total Help from another person bathing (including washing, rinsing, drying)?: A Lot Help from  another person to put on and taking off regular upper body clothing?: A Little Help from another person to put on and taking off regular lower body clothing?: Total 6 Click Score: 14    End of Session Equipment Utilized During Treatment: Oxygen  OT Visit Diagnosis: Muscle weakness (generalized) (M62.81);Other (comment)   Activity Tolerance Patient tolerated treatment well   Patient Left in bed;with call bell/phone within reach;with bed alarm set   Nurse Communication          Time: 8494-8477 OT Time Calculation (min): 17 min  Charges: OT General Charges $OT Visit: 1 Visit OT Treatments $Self Care/Home Management : 8-22 mins  Mliss HERO, OTR/L Acute Rehabilitation Services Office: [phone]   Kennth Mliss Helling 01/20/2024, 3:52 PM

## 2024-01-20 NOTE — TOC Progression Note (Addendum)
 Transition of Care [facility]) - Progression Note    Patient Details  Name: ADLYNN LOWENSTEIN MRN: [id number] Date of Birth: [birth year]  [facility] CM/SW Contact  Inocente GORMAN Kindle, LCSW Phone Number: 01/20/2024, 9:44 AM  Clinical Narrative:    9:44 AM-[facility] awaiting Bipap delivery.   12:44 PM-Facility has received Bipap. CSW made [facility] aware of discharge today and they will coordinate an RN to meet patient at the SNF.    Expected Discharge Plan: Skilled Nursing Facility Barriers to Discharge: Equipment Delay               Expected Discharge Plan and Services In-house Referral: Clinical Social Work   Post Acute Care Choice: Skilled Nursing Facility Living arrangements for the past 2 months: Skilled Nursing Facility Expected Discharge Date: 01/20/24                                     Social Drivers of Health (SDOH) Interventions SDOH Screenings   Food Insecurity: Patient Unable To Answer (01/13/2024)  Housing: Patient Unable To Answer (01/13/2024)  Transportation Needs: Patient Unable To Answer (01/13/2024)  Utilities: Patient Unable To Answer (01/13/2024)  Social Connections: Patient Unable To Answer (01/13/2024)  Tobacco Use: High Risk (01/14/2024)    Readmission Risk Interventions    08/14/2023   12:38 PM 08/12/2023    2:14 PM  Readmission Risk Prevention Plan  Transportation Screening Complete Complete  PCP or Specialist Appt within 5-7 Days Complete Complete  Home Care Screening Complete Complete  Medication Review (RN CM) Complete Complete

## 2024-01-20 NOTE — Plan of Care (Signed)
  Problem: Education: Goal: Knowledge of General Education information will improve Description: Including pain rating scale, medication(s)/side effects and non-pharmacologic comfort measures Outcome: Progressing   Problem: Health Behavior/Discharge Planning: Goal: Ability to manage health-related needs will improve Outcome: Progressing   Problem: Clinical Measurements: Goal: Ability to maintain clinical measurements within normal limits will improve Outcome: Progressing Goal: Will remain free from infection Outcome: Progressing Goal: Diagnostic test results will improve Outcome: Progressing Goal: Respiratory complications will improve Outcome: Progressing Goal: Cardiovascular complication will be avoided Outcome: Progressing   Problem: Activity: Goal: Risk for activity intolerance will decrease Outcome: Progressing   Problem: Coping: Goal: Level of anxiety will decrease Outcome: Progressing   Problem: Elimination: Goal: Will not experience complications related to bowel motility Outcome: Progressing Goal: Will not experience complications related to urinary retention Outcome: Progressing   Problem: Pain Managment: Goal: General experience of comfort will improve and/or be controlled Outcome: Progressing   Problem: Safety: Goal: Ability to remain free from injury will improve Outcome: Progressing   Problem: Skin Integrity: Goal: Risk for impaired skin integrity will decrease Outcome: Progressing   Problem: Education: Goal: Ability to demonstrate management of disease process will improve Outcome: Progressing Goal: Ability to verbalize understanding of medication therapies will improve Outcome: Progressing Goal: Individualized Educational Video(s) Outcome: Progressing   Problem: Activity: Goal: Capacity to carry out activities will improve Outcome: Progressing   Problem: Cardiac: Goal: Ability to achieve and maintain adequate cardiopulmonary perfusion will  improve Outcome: Progressing   Problem: Education: Goal: Knowledge of disease or condition will improve Outcome: Progressing Goal: Knowledge of the prescribed therapeutic regimen will improve Outcome: Progressing Goal: Individualized Educational Video(s) Outcome: Progressing   Problem: Activity: Goal: Ability to tolerate increased activity will improve Outcome: Progressing Goal: Will verbalize the importance of balancing activity with adequate rest periods Outcome: Progressing   Problem: Respiratory: Goal: Ability to maintain a clear airway will improve Outcome: Progressing Goal: Levels of oxygenation will improve Outcome: Progressing Goal: Ability to maintain adequate ventilation will improve Outcome: Progressing   Problem: Activity: Goal: Ability to tolerate increased activity will improve Outcome: Progressing   Problem: Clinical Measurements: Goal: Ability to maintain a body temperature in the normal range will improve Outcome: Progressing   Problem: Respiratory: Goal: Ability to maintain adequate ventilation will improve Outcome: Progressing Goal: Ability to maintain a clear airway will improve Outcome: Progressing

## 2024-01-20 NOTE — Progress Notes (Signed)
 Second attempt made to call report to facility.  Call was transferred several times and then went unanswered.

## 2024-01-20 NOTE — Discharge Summary (Signed)
 PATIENT DETAILS Name: Glenda Boone Age: 73 y.o. Sex: female Date of Birth: May 26, 1951 MRN: [id number]. Admitting Physician: Marsa KATHEE Scurry, MD ERE:Aojxz, Devoria LABOR, MD  Admit Date: 01/12/2024 Discharge date: 01/20/2024  Recommendations for Outpatient Follow-up:  Follow up with PCP in 1-2 weeks Please obtain CMP/CBC in one week  Admitted From:  SNF  Disposition: Skilled nursing facility   Discharge Condition: good  CODE STATUS:   Code Status: Limited: Do not attempt resuscitation (DNR) -DNR-LIMITED -Do Not Intubate/DNI    Diet recommendation:  Diet Order             Diet - low sodium heart healthy           Diet Carb Modified           Diet Heart Room service appropriate? Yes; Fluid consistency: Thin  Diet effective now                    Brief Summary: 73 year old with history of chronic hypoxic respiratory failure on 2-4 L of oxygen at baseline, COPD, HFpEF-who presented with respiratory distress-she was found to have acute on chronic hypoxic/hypercapnic respiratory failure-started on BiPAP-and subsequently admitted to the hospitalist service.  Significant studies 3/17>> echo: EF 70-75%, grade 2 diastolic dysfunction 8/19 >>CTA chest: No PE-Limited study.  Right> left lower lobe dependent airspace disease. 8/19>> urine culture: Klebsiella pneumonia 8/19>> respiratory virus panel: Negative 8/19>> COVID/influenza/RSV: Negative  Brief Hospital Course: Acute on chronic hypoxic respiratory failure Secondary to COPD/HFpEF exacerbation Treated with steroids/bronchodilators/diuretics Clinically improved-no BiPAP needs for the past several days.  Stable on 2-3 L of oxygen via nasal cannula. Outpatient BiPAP being arranged by [facility] team Slow prednisone  taper-see below Continue Lasix  on discharge  Acute metabolic encephalopathy Secondary to hypercapnia Resolved  Klebsiella UTI Completed treatment with antibiotic  Hypertension Stable Continue  metoprolol   DM-2 (A1c 7.8 on 3/16) Resume oral hypoglycemics on discharge.   Hyperlipidemia Continue statin  CKD stage IIIb Close to baseline Follow electrolytes periodically   Hypothyroidism Continue home Synthroid    Paroxysmal atrial fibrillation Continue metoprolol /Eliquis .  History of TIA Continue atorvastatin  and Eliquis    Anxiety Depression Continue Wellbutrin   Macrocytic anemia B12 deficiency Low B12 level noted in March-started on supplementation-recheck levels in 6-week at SNF.  Class 3 Obesity: Estimated body mass index is 56.12 kg/m as calculated from the following:   Height as of 08/10/23: 5' 4 (1.626 m).   Weight as of this encounter: 148.3 kg.    Discharge Diagnoses:  Principal Problem:   Acute on chronic respiratory failure with hypoxia (HCC) Active Problems:   Anxiety, generalized   Neuropathy   History of TIA (transient ischemic attack)   Essential hypertension   Obesity, Class III, BMI 40-49.9 (morbid obesity)   Type 2 diabetes mellitus with hyperlipidemia (HCC)   Hypothyroidism   COPD exacerbation (HCC)   Chronic atrial fibrillation [facility])   Discharge Instructions:  Activity:  As tolerated with Full fall precautions use walker/cane & assistance as needed Discharge Instructions     Call MD for:  difficulty breathing, headache or visual disturbances   Complete by: As directed    Diet - low sodium heart healthy   Complete by: As directed    Diet Carb Modified   Complete by: As directed    Discharge instructions   Complete by: As directed    Follow with Primary MD  Feliciano Devoria LABOR, MD in 1-2 weeks  Please get a complete blood count and chemistry panel checked  by your Primary MD at your next visit, and again as instructed by your Primary MD.  Get Medicines reviewed and adjusted: Please take all your medications with you for your next visit with your Primary MD  Laboratory/radiological data: Please request your Primary MD to go  over all hospital tests and procedure/radiological results at the follow up, please ask your Primary MD to get all Hospital records sent to his/her office.  In some cases, they will be blood work, cultures and biopsy results pending at the time of your discharge. Please request that your primary care M.D. follows up on these results.  Also Note the following: If you experience worsening of your admission symptoms, develop shortness of breath, life threatening emergency, suicidal or homicidal thoughts you must seek medical attention immediately by calling 911 or calling your MD immediately  if symptoms less severe.  You must read complete instructions/literature along with all the possible adverse reactions/side effects for all the Medicines you take and that have been prescribed to you. Take any new Medicines after you have completely understood and accpet all the possible adverse reactions/side effects.   Do not drive when taking Pain medications or sleeping medications (Benzodaizepines)  Do not take more than prescribed Pain, Sleep and Anxiety Medications. It is not advisable to combine anxiety,sleep and pain medications without talking with your primary care practitioner  Special Instructions: If you have smoked or chewed Tobacco  in the last 2 yrs please stop smoking, stop any regular Alcohol   and or any Recreational drug use.  Wear Seat belts while driving.  Please note: You were cared for by a hospitalist during your hospital stay. Once you are discharged, your primary care physician will handle any further medical issues. Please note that NO REFILLS for any discharge medications will be authorized once you are discharged, as it is imperative that you return to your primary care physician (or establish a relationship with a primary care physician if you do not have one) for your post hospital discharge needs so that they can reassess your need for medications and monitor your lab values.    Increase activity slowly   Complete by: As directed       Allergies as of 01/20/2024       Reactions   Shellfish Allergy Hives   Codeine Nausea Only   Demerol [meperidine Hcl] Anxiety   Neurontin  [gabapentin ] Other (See Comments)   Nightmares        Medication List     TAKE these medications    acetaminophen  325 MG tablet Commonly known as: TYLENOL  Take 650 mg by mouth every 6 (six) hours as needed (general discomfort, fever >/= 101F).   albuterol  108 (90 Base) MCG/ACT inhaler Commonly known as: VENTOLIN  HFA Inhale 2 puffs into the lungs every 6 (six) hours as needed for wheezing or shortness of breath.   Anoro Ellipta  62.5-25 MCG/ACT Aepb Generic drug: umeclidinium-vilanterol Inhale 1 puff into the lungs daily.   apixaban  5 MG Tabs tablet Commonly known as: ELIQUIS  Take 1 tablet (5 mg total) by mouth 2 (two) times daily.   atorvastatin  20 MG tablet Commonly known as: LIPITOR Take 1 tablet (20 mg total) by mouth daily. What changed: when to take this   barrier cream Crea Commonly known as: non-specified Apply 1 Application topically See admin instructions. Apply to labia and groin area every day and night shift and apply three times daily with every shift with all incontinent care.   budesonide  0.25 MG/2ML nebulizer  solution Commonly known as: PULMICORT  Take 2 mLs (0.25 mg total) by nebulization 2 (two) times daily.   buPROPion  75 MG tablet Commonly known as: WELLBUTRIN  Take 75 mg by mouth daily.   busPIRone  7.5 MG tablet Commonly known as: BUSPAR  Take 7.5 mg by mouth 2 (two) times daily.   cyanocobalamin  1000 MCG tablet Take 1,000 mcg by mouth once a week.   cyclobenzaprine  5 MG tablet Commonly known as: FLEXERIL  Take 5 mg by mouth daily as needed for muscle spasms.   dapagliflozin  propanediol 10 MG Tabs tablet Commonly known as: FARXIGA  Take 1 tablet (10 mg total) by mouth daily.   furosemide  40 MG tablet Commonly known as: LASIX  Take 1 tablet  (40 mg total) by mouth 2 (two) times daily. What changed: Another medication with the same name was removed. Continue taking this medication, and follow the directions you see here.   Gemtesa 75 MG Tabs Generic drug: Vibegron Take 75 mg by mouth daily.   levothyroxine  200 MCG tablet Commonly known as: SYNTHROID  Take 200 mcg by mouth daily.   lidocaine  4 % Place 1 patch onto the skin daily.   linagliptin  5 MG Tabs tablet Commonly known as: TRADJENTA  Take 5 mg by mouth daily.   metoprolol  succinate 25 MG 24 hr tablet Commonly known as: TOPROL -XL Take 1 tablet (25 mg total) by mouth daily. What changed: how much to take   MiraLax  17 GM/SCOOP powder Generic drug: polyethylene glycol powder Take 17 g by mouth daily as needed (constipation).   nystatin  cream Commonly known as: MYCOSTATIN  Apply 1 Application topically 2 (two) times daily. Two month therapy for candidiasis   OXYGEN Inhale 2 L/min into the lungs continuous.   pantoprazole  40 MG tablet Commonly known as: PROTONIX  Take 1 tablet (40 mg total) by mouth daily with breakfast. Start taking on: January 21, 2024   predniSONE  10 MG tablet Commonly known as: DELTASONE  Take 40 mg daily for 2 days, 30 mg daily for 2 days, 20 mg daily for 2 days,10 mg daily for 1 days, then stop   tetrahydrozoline 0.05 % ophthalmic solution Place 2 drops into both eyes daily.   tolterodine 1 MG tablet Commonly known as: DETROL Take 0.5 mg by mouth daily.   Zinc Oxide 20 % Pste Apply 1 application  topically every 6 (six) hours as needed (vaginal irritation). unsupervised self-administration               Durable Medical Equipment  (From admission, onward)           Start     Ordered   01/19/24 1203  For home use only DME Bipap  Once       Comments: 01/19/24 1159 BiPAP/CPAP/SIPAP BiPAP/CPAP/SIPAP Pt Type Adult BiPAP/CPAP/SIPAP SERVO Mask Type Full face mask Set Rate 22 breaths/min IPAP 18 cmH20 EPAP 8 cmH2O FiO2  (%) 40 %   Pts nightly BiPAP settings  Question Answer Comment  Length of Need 12 Months   Inspiratory pressure 18   Expiratory pressure 8      01/19/24 1203            Contact information for follow-up providers     Feliciano Radish A, MD. Schedule an appointment as soon as possible for a visit in 1 week(s).   Specialty: Family Medicine Contact information: [address] [place] [address] [phone]              Contact information for after-discharge care     Destination  [facility] .   Service: Skilled Nursing Contact information: [address] [place]  [address] [phone]                    Allergies  Allergen Reactions   Shellfish Allergy Hives   Codeine Nausea Only   Demerol [Meperidine Hcl] Anxiety   Neurontin  [Gabapentin ] Other (See Comments)    Nightmares     Other Procedures/Studies: CT Angio Chest PE W and/or Wo Contrast Result Date: 01/12/2024 EXAM: CTA CHEST AORTA 01/12/2024 12:35:50 PM TECHNIQUE: CTA of the chest was performed after the administration of intravenous contrast. Multiplanar reformatted images are provided for review. MIP images are provided for review. Automated exposure control, iterative reconstruction, and/or weight based adjustment of the mA/kV was utilized to reduce the radiation dose to as low as reasonably achievable. COMPARISON: CT dated 09/10/2010. CLINICAL HISTORY: Pulmonary embolism (PE) suspected, high prob. Triage notes; Pt BIB [facility] from [facility] for lethargy/resp distress. Arrived on CPAP with EMS. Pt was found to be slow to respond, at facility, 76% on 4L, diminished lung sounds. PT has hx of intubation. Also hx of CHF \\T \ CKD, swelling in BL all extremities; 34/84 97% CPAP, HR 82, CBG 178. Duoneb given. Facility increased her Lasix  dose this week. FINDINGS: AORTA: Aortic atherosclerosis (ICD10-I70.0). MEDIASTINUM: Moderate cardiomegaly. 3 vessel Coronary artery  atherosclerosis. LYMPH NODES: Extremely limited evaluation for thoracic adenopathy. LUNGS AND PLEURA: Right greater than left base dependent air space disease. No large pleural effusion. Tracheobronchial narrowing is likely related to expiratory imaging and a component of tracheomalacia. Right greater than left lower lobe dependent airspace disease. No saddle type pulmonary embolism, no lobar embolism to the right lung base. Pulmonary artery enlargement suggests pulmonary arterial hypertension. UPPER ABDOMEN: Hepatic steatosis with mild caudate lobe enlargement. An incompletely imaged right adrenal mass measures 3.3 x 3.1 cm on image 135 of series 5, similar to 09/10/2010. SOFT TISSUES AND BONES: No acute bone or soft tissue abnormality. ARTIFACTS/LIMITATIONS: Severely degraded, nearly nondiagnostic evaluation secondary to patient body habitus and position, as well as motion. IMPRESSION: 1. Severely limited, essentially nondiagnostic exam for evaluation of pulmonary emboli beyond the outflow tract. 2. Right greater than left lower lobe dependent airspace disease, possibly atelectasis. At the right lung base, Infection or aspiration cannot be excluded. 3. Pulmonary artery enlargement suggests pulmonary arterial hypertension. 4. Moderate cardiomegaly. Aortic atherosclerosis (icd10-i70.0). Coronary artery atherosclerosis. 5. Hepatic steatosis with caudate lobe enlargement. Cannot exclude mild cirrhosis. 6. Right adrenal mass, similar to prior study from 09/10/10, presumed benign. Electronically signed by: Rockey Kilts MD 01/12/2024 01:14 PM EDT RP Workstation: [id number]   DG Chest Port 1 View Result Date: 01/12/2024 CLINICAL DATA:  Respiratory distress. EXAM: PORTABLE CHEST 1 VIEW COMPARISON:  August 14, 2023. FINDINGS: Stable cardiomegaly with mild central pulmonary vascular congestion. Hypoinflation of the lungs is noted with elevated right hemidiaphragm. Minimal pulmonary edema may be present. Bony thorax is  unremarkable. IMPRESSION: Stable cardiomegaly with mild central pulmonary vascular congestion. Minimal pulmonary edema may be present. Electronically Signed   By: Lynwood Landy Raddle M.D.   On: 01/12/2024 11:29     TODAY-DAY OF DISCHARGE:  Subjective:   Reena Home today has no headache,no chest abdominal pain,no new weakness tingling or numbness, feels much better wants to go home today.  Objective:   Blood pressure 107/83, pulse 94, temperature (!) 96.8 F (36 C), temperature source Axillary, resp. rate 17, weight (!) 148.3 kg, SpO2 93%.  Intake/Output Summary (Last 24 hours) at 01/20/2024  0840 Last data filed at 01/20/2024 0047 Gross per 24 hour  Intake --  Output 850 ml  Net -850 ml   Filed Weights   01/16/24 0500 01/17/24 0500 01/18/24 0500  Weight: (!) 151.7 kg (!) 152 kg (!) 148.3 kg    Exam: Awake Alert, Oriented *3, No new F.N deficits, Normal affect [place].AT,PERRAL Supple Neck,No JVD, No cervical lymphadenopathy appriciated.  Symmetrical Chest wall movement, Good air movement bilaterally, CTAB RRR,No Gallops,Rubs or new Murmurs, No Parasternal Heave +ve B.Sounds, Abd Soft, Non tender, No organomegaly appriciated, No rebound -guarding or rigidity. No Cyanosis, Clubbing or edema, No new Rash or bruise   PERTINENT RADIOLOGIC STUDIES: No results found.   PERTINENT LAB RESULTS: CBC: Recent Labs    01/18/24 0600  WBC 7.8  HGB 10.6*  HCT 35.1*  PLT 266   CMET CMP     Component Value Date/Time   NA 136 01/18/2024 0600   NA 138 02/26/2014 1605   K 3.6 01/18/2024 0600   K 4.1 02/26/2014 1605   CL 92 (L) 01/18/2024 0600   CL 100 02/26/2014 1605   CO2 36 (H) 01/18/2024 0600   CO2 32 02/26/2014 1605   GLUCOSE 219 (H) 01/18/2024 0600   GLUCOSE 150 (H) 02/26/2014 1605   BUN 44 (H) 01/18/2024 0600   BUN 13 02/26/2014 1605   CREATININE 1.32 (H) 01/18/2024 0600   CREATININE 0.90 02/26/2014 1605   CALCIUM  8.5 (L) 01/18/2024 0600   CALCIUM  9.0 02/26/2014 1605    PROT 6.7 01/17/2024 0521   PROT 8.1 02/26/2014 1605   ALBUMIN 2.6 (L) 01/17/2024 0521   ALBUMIN 3.5 02/26/2014 1605   AST 21 01/17/2024 0521   AST 32 02/26/2014 1605   ALT 32 01/17/2024 0521   ALT 26 02/26/2014 1605   ALKPHOS 65 01/17/2024 0521   ALKPHOS 84 02/26/2014 1605   BILITOT 0.4 01/17/2024 0521   BILITOT 0.5 02/26/2014 1605   GFRNONAA 42 (L) 01/18/2024 0600   GFRNONAA >60 02/26/2014 1605   GFRNONAA >60 01/11/2012 1507    GFR Estimated Creatinine Clearance: 54.4 mL/min (A) (by C-G formula based on SCr of 1.32 mg/dL (H)). No results for input(s): LIPASE, AMYLASE in the last 72 hours. No results for input(s): CKTOTAL, CKMB, CKMBINDEX, TROPONINI in the last 72 hours. Invalid input(s): POCBNP No results for input(s): DDIMER in the last 72 hours. No results for input(s): HGBA1C in the last 72 hours. No results for input(s): CHOL, HDL, LDLCALC, TRIG, CHOLHDL, LDLDIRECT in the last 72 hours. No results for input(s): TSH, T4TOTAL, T3FREE, THYROIDAB in the last 72 hours.  Invalid input(s): FREET3 No results for input(s): VITAMINB12, FOLATE, FERRITIN, TIBC, IRON, RETICCTPCT in the last 72 hours. Coags: No results for input(s): INR in the last 72 hours.  Invalid input(s): PT Microbiology: Recent Results (from the past 240 hours)  Resp panel by RT-PCR (RSV, Flu A&B, Covid) Anterior Nasal Swab     Status: None   Collection Time: 01/12/24 12:52 PM   Specimen: Anterior Nasal Swab  Result Value Ref Range Status   SARS Coronavirus 2 by RT PCR NEGATIVE NEGATIVE Final   Influenza A by PCR NEGATIVE NEGATIVE Final   Influenza B by PCR NEGATIVE NEGATIVE Final    Comment: (NOTE) The Xpert Xpress SARS-CoV-2/FLU/RSV plus assay is intended as an aid in the diagnosis of influenza from Nasopharyngeal swab specimens and should not be used as a sole basis for treatment. Nasal washings and aspirates are unacceptable for Xpert Xpress  SARS-CoV-2/FLU/RSV testing.  Fact Sheet for Patients: BloggerCourse.com  Fact Sheet for Healthcare Providers: SeriousBroker.it  This test is not yet approved or cleared by the [place]  FDA and has been authorized for detection and/or diagnosis of SARS-CoV-2 by FDA under an Emergency Use Authorization (EUA). This EUA will remain in effect (meaning this test can be used) for the duration of the COVID-19 declaration under Section 564(b)(1) of the Act, 21 U.S.C. section 360bbb-3(b)(1), unless the authorization is terminated or revoked.     Resp Syncytial Virus by PCR NEGATIVE NEGATIVE Final    Comment: (NOTE) Fact Sheet for Patients: BloggerCourse.com  Fact Sheet for Healthcare Providers: SeriousBroker.it  This test is not yet approved or cleared by the [place]  FDA and has been authorized for detection and/or diagnosis of SARS-CoV-2 by FDA under an Emergency Use Authorization (EUA). This EUA will remain in effect (meaning this test can be used) for the duration of the COVID-19 declaration under Section 564(b)(1) of the Act, 21 U.S.C. section 360bbb-3(b)(1), unless the authorization is terminated or revoked.  Performed at [facility], [address], [place], [place] [address]   Urine Culture     Status: Abnormal   Collection Time: 01/12/24 12:52 PM   Specimen: Urine, Clean Catch  Result Value Ref Range Status   Specimen Description URINE, CLEAN CATCH  Final   Special Requests   Final    NONE Performed at [facility], [address] [place], [place] [address]    Culture >=100,000 COLONIES/mL KLEBSIELLA PNEUMONIAE (A)  Final   Report Status 01/14/2024 FINAL  Final   Organism ID, Bacteria KLEBSIELLA PNEUMONIAE (A)  Final      Susceptibility   Klebsiella pneumoniae - MIC*    AMPICILLIN  RESISTANT Resistant     CEFAZOLIN (URINE) Value in next row  Sensitive      2 SENSITIVEThis is a modified FDA-approved test that has been validated and its performance characteristics determined by the reporting laboratory.  This laboratory is certified under the Clinical Laboratory Improvement Amendments CLIA as qualified to perform high complexity clinical laboratory testing.    CEFEPIME Value in next row Sensitive      2 SENSITIVEThis is a modified FDA-approved test that has been validated and its performance characteristics determined by the reporting laboratory.  This laboratory is certified under the Clinical Laboratory Improvement Amendments CLIA as qualified to perform high complexity clinical laboratory testing.    ERTAPENEM Value in next row Sensitive      2 SENSITIVEThis is a modified FDA-approved test that has been validated and its performance characteristics determined by the reporting laboratory.  This laboratory is certified under the Clinical Laboratory Improvement Amendments CLIA as qualified to perform high complexity clinical laboratory testing.    CEFTRIAXONE  Value in next row Sensitive      2 SENSITIVEThis is a modified FDA-approved test that has been validated and its performance characteristics determined by the reporting laboratory.  This laboratory is certified under the Clinical Laboratory Improvement Amendments CLIA as qualified to perform high complexity clinical laboratory testing.    CIPROFLOXACIN Value in next row Sensitive      2 SENSITIVEThis is a modified FDA-approved test that has been validated and its performance characteristics determined by the reporting laboratory.  This laboratory is certified under the Clinical Laboratory Improvement Amendments CLIA as qualified to perform high complexity clinical laboratory testing.    GENTAMICIN Value in next row Sensitive      2 SENSITIVEThis is a modified FDA-approved test that  has been validated and its performance characteristics determined by the reporting laboratory.  This  laboratory is certified under the Clinical Laboratory Improvement Amendments CLIA as qualified to perform high complexity clinical laboratory testing.    NITROFURANTOIN Value in next row Intermediate      2 SENSITIVEThis is a modified FDA-approved test that has been validated and its performance characteristics determined by the reporting laboratory.  This laboratory is certified under the Clinical Laboratory Improvement Amendments CLIA as qualified to perform high complexity clinical laboratory testing.    TRIMETH /SULFA  Value in next row Sensitive      2 SENSITIVEThis is a modified FDA-approved test that has been validated and its performance characteristics determined by the reporting laboratory.  This laboratory is certified under the Clinical Laboratory Improvement Amendments CLIA as qualified to perform high complexity clinical laboratory testing.    AMPICILLIN /SULBACTAM Value in next row Sensitive      2 SENSITIVEThis is a modified FDA-approved test that has been validated and its performance characteristics determined by the reporting laboratory.  This laboratory is certified under the Clinical Laboratory Improvement Amendments CLIA as qualified to perform high complexity clinical laboratory testing.    PIP/TAZO Value in next row Sensitive ug/mL     <=4 SENSITIVEThis is a modified FDA-approved test that has been validated and its performance characteristics determined by the reporting laboratory.  This laboratory is certified under the Clinical Laboratory Improvement Amendments CLIA as qualified to perform high complexity clinical laboratory testing.    MEROPENEM Value in next row Sensitive      <=4 SENSITIVEThis is a modified FDA-approved test that has been validated and its performance characteristics determined by the reporting laboratory.  This laboratory is certified under the Clinical Laboratory Improvement Amendments CLIA as qualified to perform high complexity clinical laboratory testing.     * >=100,000 COLONIES/mL KLEBSIELLA PNEUMONIAE  Respiratory (~20 pathogens) panel by PCR     Status: None   Collection Time: 01/12/24 12:52 PM   Specimen: Nasopharyngeal Swab; Respiratory  Result Value Ref Range Status   Adenovirus NOT DETECTED NOT DETECTED Final   Coronavirus 229E NOT DETECTED NOT DETECTED Final    Comment: (NOTE) The Coronavirus on the Respiratory Panel, DOES NOT test for the novel  Coronavirus (2019 nCoV)    Coronavirus HKU1 NOT DETECTED NOT DETECTED Final   Coronavirus NL63 NOT DETECTED NOT DETECTED Final   Coronavirus OC43 NOT DETECTED NOT DETECTED Final   Metapneumovirus NOT DETECTED NOT DETECTED Final   Rhinovirus / Enterovirus NOT DETECTED NOT DETECTED Final   Influenza A NOT DETECTED NOT DETECTED Final   Influenza B NOT DETECTED NOT DETECTED Final   Parainfluenza Virus 1 NOT DETECTED NOT DETECTED Final   Parainfluenza Virus 2 NOT DETECTED NOT DETECTED Final   Parainfluenza Virus 3 NOT DETECTED NOT DETECTED Final   Parainfluenza Virus 4 NOT DETECTED NOT DETECTED Final   Respiratory Syncytial Virus NOT DETECTED NOT DETECTED Final   Bordetella pertussis NOT DETECTED NOT DETECTED Final   Bordetella Parapertussis NOT DETECTED NOT DETECTED Final   Chlamydophila pneumoniae NOT DETECTED NOT DETECTED Final   Mycoplasma pneumoniae NOT DETECTED NOT DETECTED Final    Comment: Performed at [facility], [address], [place], [place] [address]    FURTHER DISCHARGE INSTRUCTIONS:  Get Medicines reviewed and adjusted: Please take all your medications with you for your next visit with your Primary MD  Laboratory/radiological data: Please request your Primary MD to go over all hospital tests and procedure/radiological results at  the follow up, please ask your Primary MD to get all Hospital records sent to his/her office.  In some cases, they will be blood work, cultures and biopsy results pending at the time of your discharge. Please request that your primary  care M.D. goes through all the records of your hospital data and follows up on these results.  Also Note the following: If you experience worsening of your admission symptoms, develop shortness of breath, life threatening emergency, suicidal or homicidal thoughts you must seek medical attention immediately by calling 911 or calling your MD immediately  if symptoms less severe.  You must read complete instructions/literature along with all the possible adverse reactions/side effects for all the Medicines you take and that have been prescribed to you. Take any new Medicines after you have completely understood and accpet all the possible adverse reactions/side effects.   Do not drive when taking Pain medications or sleeping medications (Benzodaizepines)  Do not take more than prescribed Pain, Sleep and Anxiety Medications. It is not advisable to combine anxiety,sleep and pain medications without talking with your primary care practitioner  Special Instructions: If you have smoked or chewed Tobacco  in the last 2 yrs please stop smoking, stop any regular Alcohol   and or any Recreational drug use.  Wear Seat belts while driving.  Please note: You were cared for by a hospitalist during your hospital stay. Once you are discharged, your primary care physician will handle any further medical issues. Please note that NO REFILLS for any discharge medications will be authorized once you are discharged, as it is imperative that you return to your primary care physician (or establish a relationship with a primary care physician if you do not have one) for your post hospital discharge needs so that they can reassess your need for medications and monitor your lab values.  Total Time spent coordinating discharge including counseling, education and face to face time equals greater than 30 minutes.  Signed: Urie Loughner 01/20/2024 8:40 AM

## 2024-01-20 NOTE — Progress Notes (Signed)
 Attempt x1 to call report at this time to [facility]. After being  transferred to unit phone was not answered.

## 2024-01-21 NOTE — Progress Notes (Signed)
 [facility]-discharged  [facility] Liaison Note  Received request from PMT Recardo Bowen, NP, for hospice services at [facility] after discharge. Spoke with patient's sister Delon, to initiate education related to hospice philosophy, services and team approach to care. Delon verbalized understanding of information given. Patient discharged to facility yesterday.  DME needs discussed. Patient has the following equipment in the home: oxygen, CPAP. Family requests the following equipment for delivery: none at this time.  [facility] information and contact numbers given to [place]. Please call with any concerns.  Thank you for the opportunity to participate in this patient's care.   Eleanor Nail, LPN [facility] Liaison [phone]

## 2024-03-22 ENCOUNTER — Emergency Department (HOSPITAL_COMMUNITY)

## 2024-03-22 ENCOUNTER — Other Ambulatory Visit: Payer: Self-pay

## 2024-03-22 ENCOUNTER — Encounter (HOSPITAL_COMMUNITY): Payer: Self-pay

## 2024-03-22 ENCOUNTER — Inpatient Hospital Stay (HOSPITAL_COMMUNITY)
Admission: EM | Admit: 2024-03-22 | Discharge: 2024-03-28 | DRG: 189 | Disposition: A | Discharge status: Skilled Nursing Facility | Source: Skilled Nursing Facility | Attending: Internal Medicine | Admitting: Internal Medicine

## 2024-03-22 DIAGNOSIS — Z90710 Acquired absence of both cervix and uterus: Secondary | ICD-10-CM

## 2024-03-22 DIAGNOSIS — J9621 Acute and chronic respiratory failure with hypoxia: Secondary | ICD-10-CM | POA: Diagnosis not present

## 2024-03-22 DIAGNOSIS — Z7951 Long term (current) use of inhaled steroids: Secondary | ICD-10-CM | POA: Diagnosis not present

## 2024-03-22 DIAGNOSIS — I10 Essential (primary) hypertension: Secondary | ICD-10-CM | POA: Diagnosis present

## 2024-03-22 DIAGNOSIS — Z8673 Personal history of transient ischemic attack (TIA), and cerebral infarction without residual deficits: Secondary | ICD-10-CM | POA: Diagnosis not present

## 2024-03-22 DIAGNOSIS — Z6841 Body Mass Index (BMI) 40.0 and over, adult: Secondary | ICD-10-CM

## 2024-03-22 DIAGNOSIS — E1122 Type 2 diabetes mellitus with diabetic chronic kidney disease: Secondary | ICD-10-CM | POA: Diagnosis present

## 2024-03-22 DIAGNOSIS — E785 Hyperlipidemia, unspecified: Secondary | ICD-10-CM | POA: Diagnosis not present

## 2024-03-22 DIAGNOSIS — N1832 Chronic kidney disease, stage 3b: Secondary | ICD-10-CM | POA: Diagnosis present

## 2024-03-22 DIAGNOSIS — Z888 Allergy status to other drugs, medicaments and biological substances status: Secondary | ICD-10-CM

## 2024-03-22 DIAGNOSIS — R0689 Other abnormalities of breathing: Secondary | ICD-10-CM

## 2024-03-22 DIAGNOSIS — E039 Hypothyroidism, unspecified: Secondary | ICD-10-CM | POA: Diagnosis present

## 2024-03-22 DIAGNOSIS — Z66 Do not resuscitate: Secondary | ICD-10-CM | POA: Diagnosis present

## 2024-03-22 DIAGNOSIS — Z713 Dietary counseling and surveillance: Secondary | ICD-10-CM

## 2024-03-22 DIAGNOSIS — I480 Paroxysmal atrial fibrillation: Secondary | ICD-10-CM | POA: Diagnosis present

## 2024-03-22 DIAGNOSIS — I5033 Acute on chronic diastolic (congestive) heart failure: Secondary | ICD-10-CM | POA: Diagnosis present

## 2024-03-22 DIAGNOSIS — Z8616 Personal history of COVID-19: Secondary | ICD-10-CM | POA: Diagnosis not present

## 2024-03-22 DIAGNOSIS — Z7901 Long term (current) use of anticoagulants: Secondary | ICD-10-CM | POA: Diagnosis not present

## 2024-03-22 DIAGNOSIS — I878 Other specified disorders of veins: Secondary | ICD-10-CM | POA: Diagnosis present

## 2024-03-22 DIAGNOSIS — N1831 Chronic kidney disease, stage 3a: Secondary | ICD-10-CM

## 2024-03-22 DIAGNOSIS — F419 Anxiety disorder, unspecified: Secondary | ICD-10-CM | POA: Diagnosis present

## 2024-03-22 DIAGNOSIS — E8729 Other acidosis: Secondary | ICD-10-CM | POA: Diagnosis present

## 2024-03-22 DIAGNOSIS — Z8543 Personal history of malignant neoplasm of ovary: Secondary | ICD-10-CM

## 2024-03-22 DIAGNOSIS — Z79899 Other long term (current) drug therapy: Secondary | ICD-10-CM

## 2024-03-22 DIAGNOSIS — Z91013 Allergy to seafood: Secondary | ICD-10-CM

## 2024-03-22 DIAGNOSIS — Z8249 Family history of ischemic heart disease and other diseases of the circulatory system: Secondary | ICD-10-CM

## 2024-03-22 DIAGNOSIS — B961 Klebsiella pneumoniae [K. pneumoniae] as the cause of diseases classified elsewhere: Secondary | ICD-10-CM | POA: Diagnosis present

## 2024-03-22 DIAGNOSIS — E7849 Other hyperlipidemia: Secondary | ICD-10-CM | POA: Diagnosis present

## 2024-03-22 DIAGNOSIS — Z79890 Hormone replacement therapy (postmenopausal): Secondary | ICD-10-CM | POA: Diagnosis not present

## 2024-03-22 DIAGNOSIS — J441 Chronic obstructive pulmonary disease with (acute) exacerbation: Principal | ICD-10-CM | POA: Diagnosis present

## 2024-03-22 DIAGNOSIS — J9622 Acute and chronic respiratory failure with hypercapnia: Secondary | ICD-10-CM

## 2024-03-22 DIAGNOSIS — E1140 Type 2 diabetes mellitus with diabetic neuropathy, unspecified: Secondary | ICD-10-CM | POA: Diagnosis present

## 2024-03-22 DIAGNOSIS — Z7984 Long term (current) use of oral hypoglycemic drugs: Secondary | ICD-10-CM

## 2024-03-22 DIAGNOSIS — Z9981 Dependence on supplemental oxygen: Secondary | ICD-10-CM | POA: Diagnosis not present

## 2024-03-22 DIAGNOSIS — E66813 Obesity, class 3: Secondary | ICD-10-CM | POA: Diagnosis present

## 2024-03-22 DIAGNOSIS — I130 Hypertensive heart and chronic kidney disease with heart failure and stage 1 through stage 4 chronic kidney disease, or unspecified chronic kidney disease: Secondary | ICD-10-CM | POA: Diagnosis present

## 2024-03-22 DIAGNOSIS — N3000 Acute cystitis without hematuria: Secondary | ICD-10-CM | POA: Diagnosis not present

## 2024-03-22 DIAGNOSIS — F32A Depression, unspecified: Secondary | ICD-10-CM | POA: Diagnosis present

## 2024-03-22 DIAGNOSIS — E1169 Type 2 diabetes mellitus with other specified complication: Secondary | ICD-10-CM | POA: Diagnosis present

## 2024-03-22 DIAGNOSIS — F17210 Nicotine dependence, cigarettes, uncomplicated: Secondary | ICD-10-CM | POA: Diagnosis present

## 2024-03-22 DIAGNOSIS — J9601 Acute respiratory failure with hypoxia: Secondary | ICD-10-CM

## 2024-03-22 DIAGNOSIS — Z9049 Acquired absence of other specified parts of digestive tract: Secondary | ICD-10-CM

## 2024-03-22 DIAGNOSIS — Z800 Family history of malignant neoplasm of digestive organs: Secondary | ICD-10-CM

## 2024-03-22 DIAGNOSIS — Z885 Allergy status to narcotic agent status: Secondary | ICD-10-CM

## 2024-03-22 DIAGNOSIS — J449 Chronic obstructive pulmonary disease, unspecified: Secondary | ICD-10-CM | POA: Diagnosis present

## 2024-03-22 LAB — I-STAT ARTERIAL BLOOD GAS, ED
Acid-Base Excess: 15 mmol/L — ABNORMAL HIGH (ref 0.0–2.0)
Bicarbonate: 46.5 mmol/L — ABNORMAL HIGH (ref 20.0–28.0)
Calcium, Ion: 1.2 mmol/L (ref 1.15–1.40)
HCT: 33 % — ABNORMAL LOW (ref 36.0–46.0)
Hemoglobin: 11.2 g/dL — ABNORMAL LOW (ref 12.0–15.0)
O2 Saturation: 90 %
Patient temperature: 36
Potassium: 4.5 mmol/L (ref 3.5–5.1)
Sodium: 140 mmol/L (ref 135–145)
TCO2: 50 mmol/L — ABNORMAL HIGH (ref 22–32)
pCO2 arterial: 101.5 mmHg (ref 32–48)
pH, Arterial: 7.264 — ABNORMAL LOW (ref 7.35–7.45)
pO2, Arterial: 68 mmHg — ABNORMAL LOW (ref 83–108)

## 2024-03-22 LAB — CBC WITH DIFFERENTIAL/PLATELET
Abs Immature Granulocytes: 0.08 K/uL — ABNORMAL HIGH (ref 0.00–0.07)
Basophils Absolute: 0.1 K/uL (ref 0.0–0.1)
Basophils Relative: 1 %
Eosinophils Absolute: 0.2 K/uL (ref 0.0–0.5)
Eosinophils Relative: 2 %
HCT: 36.4 % (ref 36.0–46.0)
Hemoglobin: 10.3 g/dL — ABNORMAL LOW (ref 12.0–15.0)
Immature Granulocytes: 1 %
Lymphocytes Relative: 12 %
Lymphs Abs: 1.3 K/uL (ref 0.7–4.0)
MCH: 30.2 pg (ref 26.0–34.0)
MCHC: 28.3 g/dL — ABNORMAL LOW (ref 30.0–36.0)
MCV: 106.7 fL — ABNORMAL HIGH (ref 80.0–100.0)
Monocytes Absolute: 0.5 K/uL (ref 0.1–1.0)
Monocytes Relative: 5 %
Neutro Abs: 8.1 K/uL — ABNORMAL HIGH (ref 1.7–7.7)
Neutrophils Relative %: 79 %
Platelets: 283 K/uL (ref 150–400)
RBC: 3.41 MIL/uL — ABNORMAL LOW (ref 3.87–5.11)
RDW: 17.1 % — ABNORMAL HIGH (ref 11.5–15.5)
WBC: 10.1 K/uL (ref 4.0–10.5)
nRBC: 0 % (ref 0.0–0.2)

## 2024-03-22 LAB — HEMOGLOBIN A1C
Hgb A1c MFr Bld: 6.3 % — ABNORMAL HIGH (ref 4.8–5.6)
Mean Plasma Glucose: 134.11 mg/dL

## 2024-03-22 LAB — RESP PANEL BY RT-PCR (RSV, FLU A&B, COVID)  RVPGX2
Influenza A by PCR: NEGATIVE
Influenza B by PCR: NEGATIVE
Resp Syncytial Virus by PCR: NEGATIVE
SARS Coronavirus 2 by RT PCR: NEGATIVE

## 2024-03-22 LAB — URINALYSIS, W/ REFLEX TO CULTURE (INFECTION SUSPECTED)
Bilirubin Urine: NEGATIVE
Glucose, UA: >=500 mg/dL — AB
Hgb urine dipstick: NEGATIVE
Ketones, ur: NEGATIVE mg/dL
Nitrite: POSITIVE — AB
Protein, ur: NEGATIVE mg/dL
Specific Gravity, Urine: 1.006 (ref 1.005–1.030)
pH: 5 (ref 5.0–8.0)

## 2024-03-22 LAB — CBG MONITORING, ED: Glucose-Capillary: 134 mg/dL — ABNORMAL HIGH (ref 70–99)

## 2024-03-22 LAB — I-STAT CG4 LACTIC ACID, ED: Lactic Acid, Venous: 1.3 mmol/L (ref 0.5–1.9)

## 2024-03-22 LAB — BRAIN NATRIURETIC PEPTIDE: B Natriuretic Peptide: 114.6 pg/mL — ABNORMAL HIGH (ref 0.0–100.0)

## 2024-03-22 MED ORDER — SODIUM CHLORIDE 0.9 % IV SOLN
500.0000 mg | Freq: Once | INTRAVENOUS | Status: AC
  Administered 2024-03-22: 500 mg via INTRAVENOUS
  Filled 2024-03-22: qty 5

## 2024-03-22 MED ORDER — INSULIN ASPART 100 UNIT/ML IJ SOLN
0.0000 [IU] | Freq: Three times a day (TID) | INTRAMUSCULAR | Status: DC
  Administered 2024-03-23: 5 [IU] via SUBCUTANEOUS
  Administered 2024-03-23 – 2024-03-24 (×5): 3 [IU] via SUBCUTANEOUS
  Administered 2024-03-25: 5 [IU] via SUBCUTANEOUS
  Administered 2024-03-25: 3 [IU] via SUBCUTANEOUS
  Administered 2024-03-25: 5 [IU] via SUBCUTANEOUS
  Administered 2024-03-26: 3 [IU] via SUBCUTANEOUS
  Administered 2024-03-26 (×2): 5 [IU] via SUBCUTANEOUS
  Administered 2024-03-27: 2 [IU] via SUBCUTANEOUS
  Administered 2024-03-27: 3 [IU] via SUBCUTANEOUS
  Administered 2024-03-27: 2 [IU] via SUBCUTANEOUS
  Administered 2024-03-28: 5 [IU] via SUBCUTANEOUS
  Administered 2024-03-28 (×2): 2 [IU] via SUBCUTANEOUS
  Filled 2024-03-22: qty 5
  Filled 2024-03-22 (×2): qty 2
  Filled 2024-03-22: qty 5

## 2024-03-22 MED ORDER — SENNOSIDES-DOCUSATE SODIUM 8.6-50 MG PO TABS: 1.0000 | ORAL_TABLET | Freq: Every evening | ORAL | Status: DC | PRN

## 2024-03-22 MED ORDER — ALBUTEROL SULFATE (2.5 MG/3ML) 0.083% IN NEBU
10.0000 mg | INHALATION_SOLUTION | Freq: Once | RESPIRATORY_TRACT | Status: AC
  Administered 2024-03-22: 10 mg via RESPIRATORY_TRACT
  Filled 2024-03-22: qty 12

## 2024-03-22 MED ORDER — BISACODYL 5 MG PO TBEC: 5.0000 mg | DELAYED_RELEASE_TABLET | Freq: Every day | ORAL | Status: DC | PRN

## 2024-03-22 MED ORDER — ONDANSETRON HCL 4 MG PO TABS: 4.0000 mg | ORAL_TABLET | Freq: Four times a day (QID) | ORAL | Status: DC | PRN

## 2024-03-22 MED ORDER — FUROSEMIDE 10 MG/ML IJ SOLN
40.0000 mg | Freq: Once | INTRAMUSCULAR | Status: AC
  Administered 2024-03-22: 40 mg via INTRAVENOUS
  Filled 2024-03-22: qty 4

## 2024-03-22 MED ORDER — ONDANSETRON HCL 4 MG/2ML IJ SOLN: 4.0000 mg | Freq: Four times a day (QID) | INTRAMUSCULAR | Status: DC | PRN

## 2024-03-22 MED ORDER — ACETAMINOPHEN 650 MG RE SUPP
650.0000 mg | Freq: Four times a day (QID) | RECTAL | Status: DC | PRN
Start: 2024-03-22 — End: 2024-03-28

## 2024-03-22 MED ORDER — ACETAMINOPHEN 325 MG PO TABS
650.0000 mg | ORAL_TABLET | Freq: Four times a day (QID) | ORAL | Status: DC | PRN
  Administered 2024-03-25 – 2024-03-26 (×2): 650 mg via ORAL
  Filled 2024-03-22 (×2): qty 2

## 2024-03-22 MED ORDER — INSULIN ASPART 100 UNIT/ML IJ SOLN: 0.0000 [IU] | Freq: Every day | INTRAMUSCULAR | Status: DC

## 2024-03-22 NOTE — ED Notes (Signed)
 RN from [facility] updated on plan of care

## 2024-03-22 NOTE — ED Notes (Signed)
 Per Hospice RN, she will update the family

## 2024-03-22 NOTE — ED Notes (Signed)
 [facility] [employer]) called asking for an update; his number is [phone]

## 2024-03-22 NOTE — ED Provider Notes (Signed)
 [place] EMERGENCY DEPARTMENT AT [facility] Provider Note   CSN: [id number] Arrival date & time: 03/22/24  8065     Patient presents with: Respiratory Distress   Glenda Boone is a 73 y.o. female.  With a history of COPD, HFrEF type 2 diabetes morbid obesity who presents to the ED for respiratory distress.  Increased shortness of breath began this afternoon at patient's skilled nursing facility and has worsened since the onset.  She is on 6 L baseline but noted to be hypoxic in the 80s on baseline O2.  EMS reports improvement after initiating CPAP and administering 3X DuoNeb breathing treatments, 125 Solu-Medrol  and 2 g magnesium .  Patient awake alert reports improvement.  Able to answer yes no on CPAP.  Denies chest pain fevers chills nausea vomiting.  Recent admission in August for acute hypoxemic respiratory failure  {Add pertinent medical, surgical, social history, OB history to HPI:32947} HPI     Prior to Admission medications   Medication Sig Start Date End Date Taking? Authorizing Provider  acetaminophen  (TYLENOL ) 325 MG tablet Take 650 mg by mouth every 6 (six) hours as needed (general discomfort, fever >/= 101F).    [provider]  albuterol  (VENTOLIN  HFA) 108 (90 Base) MCG/ACT inhaler Inhale 2 puffs into the lungs every 6 (six) hours as needed for wheezing or shortness of breath. Patient not taking: Reported on 01/12/2024 01/01/20   Gonfa, Taye T, MD  apixaban  (ELIQUIS ) 5 MG TABS tablet Take 1 tablet (5 mg total) by mouth 2 (two) times daily. 02/16/20   Awanda City, MD  atorvastatin  (LIPITOR) 20 MG tablet Take 1 tablet (20 mg total) by mouth daily. Patient taking differently: Take 20 mg by mouth at bedtime. 05/31/22   Nooruddin, Saad, MD  barrier cream (NON-SPECIFIED) CREA Apply 1 Application topically See admin instructions. Apply to labia and groin area every day and night shift and apply three times daily with every shift with all incontinent care.     [provider]  budesonide  (PULMICORT ) 0.25 MG/2ML nebulizer solution Take 2 mLs (0.25 mg total) by nebulization 2 (two) times daily. 08/17/23   Will Almarie MATSU, MD  buPROPion  (WELLBUTRIN ) 75 MG tablet Take 75 mg by mouth daily.    [provider]  busPIRone  (BUSPAR ) 7.5 MG tablet Take 7.5 mg by mouth 2 (two) times daily.    [provider]  cyanocobalamin  1000 MCG tablet Take 1,000 mcg by mouth once a week.    [provider]  cyclobenzaprine  (FLEXERIL ) 5 MG tablet Take 5 mg by mouth daily as needed for muscle spasms.    [provider]  dapagliflozin  propanediol (FARXIGA ) 10 MG TABS tablet Take 1 tablet (10 mg total) by mouth daily. 05/31/22   Nooruddin, Saad, MD  furosemide  (LASIX ) 40 MG tablet Take 1 tablet (40 mg total) by mouth 2 (two) times daily. 08/17/23   Will Almarie MATSU, MD  levothyroxine  (SYNTHROID ) 200 MCG tablet Take 200 mcg by mouth daily.    [provider]  lidocaine  4 % Place 1 patch onto the skin daily. Patient not taking: Reported on 01/12/2024    [provider]  linagliptin  (TRADJENTA ) 5 MG TABS tablet Take 5 mg by mouth daily.    [provider]  metoprolol  succinate (TOPROL -XL) 25 MG 24 hr tablet Take 1 tablet (25 mg total) by mouth daily. 01/20/24   Ghimire, Donalda HERO, MD  OXYGEN Inhale 2 L/min into the lungs continuous.    [provider]  pantoprazole  (PROTONIX ) 40 MG tablet Take 1 tablet (40 mg total) by mouth daily with breakfast. 01/21/24   Ghimire, Donalda HERO, MD  polyethylene glycol powder (MIRALAX ) 17 GM/SCOOP powder Take 17 g by mouth daily as needed (constipation).    [provider]  predniSONE  (DELTASONE ) 10 MG tablet Take 40 mg daily for 2 days, 30 mg daily for 2 days, 20 mg daily for 2 days,10 mg daily for 1 days, then stop 01/20/24   Raenelle Donalda HERO, MD  tetrahydrozoline 0.05 % ophthalmic solution Place 2 drops into both eyes daily.    [provider]   tolterodine (DETROL) 1 MG tablet Take 0.5 mg by mouth daily.    [provider]  umeclidinium-vilanterol (ANORO ELLIPTA ) 62.5-25 MCG/ACT AEPB Inhale 1 puff into the lungs daily.    [provider]  Vibegron (GEMTESA) 75 MG TABS Take 75 mg by mouth daily.    [provider]  Zinc Oxide 20 % PSTE Apply 1 application  topically every 6 (six) hours as needed (vaginal irritation). unsupervised self-administration Patient not taking: Reported on 01/12/2024    [provider]    Allergies: Shellfish allergy, Codeine, Demerol [meperidine hcl], and Neurontin  [gabapentin ]    Review of Systems  Updated Vital Signs BP (!) 129/98   Pulse (!) 43   Temp (!) 96.8 F (36 C) (Temporal)   Resp (!) 22   SpO2 100%   Physical Exam Vitals and nursing note reviewed.  HENT:     Head: Normocephalic and atraumatic.  Eyes:     Pupils: Pupils are equal, round, and reactive to light.  Cardiovascular:     Rate and Rhythm: Normal rate and regular rhythm.  Pulmonary:     Effort: Pulmonary effort is normal.     Breath sounds: Wheezing present.  Abdominal:     Palpations: Abdomen is soft.     Tenderness: There is no abdominal tenderness.  Skin:    General: Skin is warm and dry.     Comments: Chronic venous stasis changes over bilateral lower legs with no sacral decubitus ulcer  Neurological:     General: No focal deficit present.     Mental Status: She is alert.  Psychiatric:        Mood and Affect: Mood normal.     (all labs ordered are listed, but only abnormal results are displayed) Labs Reviewed  BRAIN NATRIURETIC PEPTIDE - Abnormal; Notable for the following components:      Result Value   B Natriuretic Peptide 114.6 (*)    All other components within normal limits  CBC WITH DIFFERENTIAL/PLATELET - Abnormal; Notable for the following components:   RBC 3.41 (*)    Hemoglobin 10.3 (*)    MCV 106.7 (*)    MCHC 28.3 (*)    RDW 17.1 (*)    Neutro Abs 8.1 (*)     Abs Immature Granulocytes 0.08 (*)    All other components within normal limits  I-STAT ARTERIAL BLOOD GAS, ED - Abnormal; Notable for the following components:   pH, Arterial 7.264 (*)    pCO2 arterial 101.5 (*)    pO2, Arterial 68 (*)    Bicarbonate 46.5 (*)    TCO2 50 (*)    Acid-Base Excess 15.0 (*)    HCT 33.0 (*)    Hemoglobin 11.2 (*)    All other components within normal limits  RESP PANEL BY RT-PCR (RSV, FLU A&B, COVID)  RVPGX2  CULTURE, BLOOD (ROUTINE X 2)  CULTURE, BLOOD (ROUTINE X 2)  URINALYSIS, W/ REFLEX TO CULTURE (INFECTION SUSPECTED)  COMPREHENSIVE METABOLIC PANEL WITH GFR  I-STAT CG4 LACTIC ACID, ED  I-STAT CG4 LACTIC ACID, ED    EKG: None  Radiology: DG Chest Portable 1 View Result Date: 03/22/2024 EXAM: 1 VIEW(S) XRAY OF THE CHEST 03/22/2024 08:57:00 PM COMPARISON: Chest x-ray 01/12/2024, CT chest 01/12/2024. CLINICAL HISTORY: Encounter for heart failure and COPD. FINDINGS: LUNGS AND PLEURA: Low lung volumes with chronic coarsening interstitial markings. Nonspecific blunting of bilateral costophrenic angles. No focal pulmonary opacity. No pneumothorax. HEART AND MEDIASTINUM: Unchanged cardiomediastinal silhouette. Atherosclerotic plaque. BONES AND SOFT TISSUES: No acute osseous abnormality. IMPRESSION: 1. Low lung volumes.Recommend repeat chest x-ray PA and lateral view with improved inspiratory effort for further evaluation. Electronically signed by: Morgane Naveau MD 03/22/2024 09:03 PM EDT RP Workstation: [id number]    {Document cardiac monitor, telemetry assessment procedure when appropriate:32947} Ultrasound ED Thoracic  Date/Time: 03/22/2024 9:34 PM  Performed by: Pamella Ozell LABOR, DO Authorized by: Pamella Ozell LABOR, DO   Procedure details:    Indications: dyspnea     Assessment for:  Pleural effusion, interstitial syndrome and pneumothorax   Left lung pleural:  Visualized   Right lung pleural:  Visualized   Images: archived     Limitations:   Body habitus Right Lung Findings:     right lung sliding    no right lung consolidation    no right lung pleural effusion  Left Lung Findings:     left lung sliding    no left lung consolidation    no left lung pleural effusion Impression:    Impression: interstitial syndrome     Impression comment:  Scant B-lines in bilateral upper lung fields .Critical Care  Performed by: Pamella Ozell LABOR, DO Authorized by: Pamella Ozell LABOR, DO   Critical care provider statement:    Critical care time (minutes):  50   Critical care was necessary to treat or prevent imminent or life-threatening deterioration of the following conditions:  Respiratory failure   Critical care was time spent personally by me on the following activities:  Development of treatment plan with patient or surrogate, discussions with consultants, evaluation of patient's response to treatment, examination of patient, ordering and review of laboratory studies, ordering and review of radiographic studies, ordering and performing treatments and interventions, pulse oximetry, re-evaluation of patient's condition and review of old charts    Medications Ordered in the ED  azithromycin  (ZITHROMAX ) 500 mg in sodium chloride  0.9 % 250 mL IVPB (has no administration in time range)  furosemide  (LASIX ) injection 40 mg (40 mg Intravenous Given 03/22/24 2016)      {Click here for ABCD2, HEART and other calculators REFRESH Note before signing:1}                              Medical Decision Making 73 year old female with history as above returns for acute respiratory distress.  6 L O2 baseline.  Hypoxia in the 80s on home O2.  Improvement with CPAP DuoNeb magnesium  Solu-Medrol  with EMS prior to arrival.  We transitioned her over to BiPAP which she is tolerating well.  Initial ABG notable for severe hypercarbia with CO2 greater than 100 respiratory acidosis.  We increased the respiratory rate on the vent.  Some scant B-lines on ultrasound.  Most  likely mixed picture of COPD HFrEF.  Will provide her with a dose of Lasix  as well.  Cover with azithromycin   for COPD exacerbation.  No leukocytosis to be consistent with pneumonia at this time.  Will need repeat chest x-ray.  Will admit to medicine.  Amount and/or Complexity of Data Reviewed Labs: ordered. Radiology: ordered.  Risk Prescription drug management.     {Document critical care time when appropriate  Document review of labs and clinical decision tools ie CHADS2VASC2, etc  Document your independent review of radiology images and any outside records  Document your discussion with family members, caretakers and with consultants  Document social determinants of health affecting pt's care  Document your decision making why or why not admission, treatments were needed:32947:::1}   Final diagnoses:  COPD exacerbation (HCC)  Acute respiratory failure with hypoxia and hypercarbia [facility])    ED Discharge Orders     None

## 2024-03-22 NOTE — ED Notes (Signed)
 CCMD called.

## 2024-03-22 NOTE — H&P (Signed)
 History and Physical  Glenda Boone FMW:[id number] DOB: [birth year] DOA: 03/22/2024  PCP: Feliciano Devoria LABOR, MD   Chief Complaint: Shortness of breath, hypoxia  HPI: Glenda Boone is a 73 y.o. female with medical history significant for HTN, HLD, T2DM, TIA, atrial fibrillation on Eliquis , hypothyroidism, chronic diastolic CHF, anxiety, depression, obesity, chronic respiratory failure with hypoxia on 2-4 L, chronic bronchitis, COPD, and neuropathy who presented from [facility] for evaluation of shortness of breath and hypoxia.  Per EMS report, they were called to the SNF for respiratory distress.  It was reported that patient has been short of breath since this afternoon. Patient was found to have SpO2 in the 80s on 6 L [place]. EMS administered 3 DuoNebs, IV Solu-Medrol  125 x 1 and 2 g of mag. Patient evaluated at bedside and is somnolent on BiPAP therapy.  Able to open eyes and follow minimal command but remains lethargic and unable to answer much questions. Discussed with grandson over the phone who informs me that patient's BiPAP has been broken over the last 1-2 weeks at her facility.  ED Course: Initial vitals show temp 96.8, RR 18-25, HR 40-60s, SBP 120-130s, SpO2 96% on BiPAP. Initial labs significant for WBC 10.1, Hgb 10.3, BNP 114, lactic acid 1.3, ABG shows pH 7.26, pCO2 101.5, pO2 68 and bicarb 46.5, negative flu, RSV and COVID test. CXR shows increasing retrocardiac opacity concerning for atelectasis versus pneumonia, patchy left suprahilar opacity. Pt received IV Lasix  40 mg x 1, DuoNeb, IV azithromycin  and placed on BiPAP. TRH was consulted for admission.   Review of Systems: Please see HPI for pertinent positives and negatives. A complete 10 system review of systems are otherwise negative.  Past Medical History:  Diagnosis Date   Acute metabolic encephalopathy 12/30/2019   Acute on chronic congestive heart failure (HCC) 08/09/2023   Acute on chronic hypoxic respiratory failure  (HCC) 05/23/2022   Anxiety    Atrial fibrillation with rapid ventricular response (HCC) 02/11/2020   Bronchitis    COPD (chronic obstructive pulmonary disease) (HCC)    Depression    Diabetes mellitus, type II (HCC)    Dyspnea    Enterococcal bacteremia 08/13/2023   GERD (gastroesophageal reflux disease)    Hyperlipidemia    Hypertension    Hypothyroidism    Neuropathy    Ovarian cancer (HCC)    Pneumonia due to COVID-19 virus 01/31/2020   Shingles    Shingles outbreak 06/21/2015   Streptococcal bacteremia 08/14/2023   Thyroid  disease    Past Surgical History:  Procedure Laterality Date   ABDOMINAL HYSTERECTOMY     ANKLE SURGERY Left    CHOLECYSTECTOMY     OVARY SURGERY     SMALL INTESTINE SURGERY     TONSILLECTOMY     Social History:  reports that she has been smoking cigarettes. She started smoking about 44 years ago. She has a 44.3 pack-year smoking history. She has never used smokeless tobacco. She reports that she does not drink alcohol  and does not use drugs.  Allergies  Allergen Reactions   Shellfish Allergy Hives   Codeine Nausea Only   Demerol [Meperidine Hcl] Anxiety   Neurontin  [Gabapentin ] Other (See Comments)    Nightmares    Family History  Problem Relation Age of Onset   Heart Problems Mother    Hypertension Mother    Colon cancer Father    Arthritis-Osteo Sister      Prior to Admission medications   Medication Sig Start Date End Date  Taking? Authorizing Provider  acetaminophen  (TYLENOL ) 325 MG tablet Take 650 mg by mouth every 6 (six) hours as needed (general discomfort, fever >/= 101F).    [provider]  albuterol  (VENTOLIN  HFA) 108 (90 Base) MCG/ACT inhaler Inhale 2 puffs into the lungs every 6 (six) hours as needed for wheezing or shortness of breath. Patient not taking: Reported on 01/12/2024 01/01/20   Gonfa, Taye T, MD  apixaban  (ELIQUIS ) 5 MG TABS tablet Take 1 tablet (5 mg total) by mouth 2 (two) times daily. 02/16/20   Awanda [place],  MD  atorvastatin  (LIPITOR) 20 MG tablet Take 1 tablet (20 mg total) by mouth daily. Patient taking differently: Take 20 mg by mouth at bedtime. 05/31/22   Nooruddin, Saad, MD  barrier cream (NON-SPECIFIED) CREA Apply 1 Application topically See admin instructions. Apply to labia and groin area every day and night shift and apply three times daily with every shift with all incontinent care.    [provider]  budesonide  (PULMICORT ) 0.25 MG/2ML nebulizer solution Take 2 mLs (0.25 mg total) by nebulization 2 (two) times daily. 08/17/23   Will Almarie MATSU, MD  buPROPion  (WELLBUTRIN ) 75 MG tablet Take 75 mg by mouth daily.    [provider]  busPIRone  (BUSPAR ) 7.5 MG tablet Take 7.5 mg by mouth 2 (two) times daily.    [provider]  cyanocobalamin  1000 MCG tablet Take 1,000 mcg by mouth once a week.    [provider]  cyclobenzaprine  (FLEXERIL ) 5 MG tablet Take 5 mg by mouth daily as needed for muscle spasms.    [provider]  dapagliflozin  propanediol (FARXIGA ) 10 MG TABS tablet Take 1 tablet (10 mg total) by mouth daily. 05/31/22   Nooruddin, Saad, MD  furosemide  (LASIX ) 40 MG tablet Take 1 tablet (40 mg total) by mouth 2 (two) times daily. 08/17/23   Will Almarie MATSU, MD  levothyroxine  (SYNTHROID ) 200 MCG tablet Take 200 mcg by mouth daily.    [provider]  lidocaine  4 % Place 1 patch onto the skin daily. Patient not taking: Reported on 01/12/2024    [provider]  linagliptin  (TRADJENTA ) 5 MG TABS tablet Take 5 mg by mouth daily.    [provider]  metoprolol  succinate (TOPROL -XL) 25 MG 24 hr tablet Take 1 tablet (25 mg total) by mouth daily. 01/20/24   Ghimire, Donalda HERO, MD  OXYGEN Inhale 2 L/min into the lungs continuous.    [provider]  pantoprazole  (PROTONIX ) 40 MG tablet Take 1 tablet (40 mg total) by mouth daily with breakfast. 01/21/24   Ghimire, Donalda HERO, MD  polyethylene glycol powder  (MIRALAX ) 17 GM/SCOOP powder Take 17 g by mouth daily as needed (constipation).    [provider]  predniSONE  (DELTASONE ) 10 MG tablet Take 40 mg daily for 2 days, 30 mg daily for 2 days, 20 mg daily for 2 days,10 mg daily for 1 days, then stop 01/20/24   Raenelle Donalda HERO, MD  tetrahydrozoline 0.05 % ophthalmic solution Place 2 drops into both eyes daily.    [provider]  tolterodine (DETROL) 1 MG tablet Take 0.5 mg by mouth daily.    [provider]  umeclidinium-vilanterol (ANORO ELLIPTA ) 62.5-25 MCG/ACT AEPB Inhale 1 puff into the lungs daily.    [provider]  Vibegron (GEMTESA) 75 MG TABS Take 75 mg by mouth daily.    [provider]  Zinc Oxide 20 % PSTE Apply 1 application  topically every 6 (six)  hours as needed (vaginal irritation). unsupervised self-administration Patient not taking: Reported on 01/12/2024    [provider]    Physical Exam: BP (!) 129/98   Pulse (!) 43   Temp (!) 96.8 F (36 C) (Temporal)   Resp (!) 22   SpO2 100%  General: Somnolent morbidly obese woman on BiPAP. No acute distress. HEENT: [place]/AT. Anicteric sclera CV: RRR. No murmurs, rubs, or gallops.  Pulmonary: On BiPAP.  Lungs CTAB.  Decreased breath sounds throughout. Abdominal: Soft, nontender, nondistended. Normal bowel sounds. Extremities: Normal ROM. Mild intermittent spasms of her extremities. Skin: Warm and dry. No obvious rash or lesions. Neuro: Somnolent.  Generalized weakness. Moves all extremities. Normal sensation to light touch. No focal deficit. Psych: Normal mood and affect          Labs on Admission:  Basic Metabolic Panel: Recent Labs  Lab 03/22/24 2013  NA 140  K 4.5   Liver Function Tests: No results for input(s): AST, ALT, ALKPHOS, BILITOT, PROT, ALBUMIN in the last 168 hours. No results for input(s): LIPASE, AMYLASE in the last 168 hours. No results for input(s): AMMONIA in the last 168  hours. CBC: Recent Labs  Lab 03/22/24 2004 03/22/24 2013  WBC 10.1  --   NEUTROABS 8.1*  --   HGB 10.3* 11.2*  HCT 36.4 33.0*  MCV 106.7*  --   PLT 283  --    Cardiac Enzymes: No results for input(s): CKTOTAL, CKMB, CKMBINDEX, TROPONINI in the last 168 hours. BNP (last 3 results) Recent Labs    01/14/24 0413 01/15/24 0531 03/22/24 2004  BNP 58.1 126.0* 114.6*    ProBNP (last 3 results) No results for input(s): PROBNP in the last 8760 hours.  CBG: No results for input(s): GLUCAP in the last 168 hours.  Radiological Exams on Admission: DG Chest Portable 1 View Result Date: 03/22/2024 CLINICAL DATA:  COPD.  Heart failure. EXAM: PORTABLE CHEST 1 VIEW COMPARISON:  Radiograph earlier today FINDINGS: Unchanged elevation of right hemidiaphragm. Persistent low lung volumes with coarse lung markings. Stable blunting of the costophrenic angles. Query increasing retrocardiac opacity. Patchy left suprahilar patchy. Stable heart size and mediastinal contours. No pneumothorax. IMPRESSION: Persistent low lung volumes with coarse lung markings. Query increasing retrocardiac opacity, atelectasis versus pneumonia. Patchy left suprahilar opacity. Electronically Signed   By: Andrea Gasman M.D.   On: 03/22/2024 21:44   DG Chest Portable 1 View Result Date: 03/22/2024 EXAM: 1 VIEW(S) XRAY OF THE CHEST 03/22/2024 08:57:00 PM COMPARISON: Chest x-ray 01/12/2024, CT chest 01/12/2024. CLINICAL HISTORY: Encounter for heart failure and COPD. FINDINGS: LUNGS AND PLEURA: Low lung volumes with chronic coarsening interstitial markings. Nonspecific blunting of bilateral costophrenic angles. No focal pulmonary opacity. No pneumothorax. HEART AND MEDIASTINUM: Unchanged cardiomediastinal silhouette. Atherosclerotic plaque. BONES AND SOFT TISSUES: No acute osseous abnormality. IMPRESSION: 1. Low lung volumes.Recommend repeat chest x-ray PA and lateral view with improved inspiratory effort for further  evaluation. Electronically signed by: Morgane Naveau MD 03/22/2024 09:03 PM EDT RP Workstation: [id number]   My independent interpretation of EKG: Sinus rhythm with slightly prolonged QTc of 479  Assessment/Plan Glenda Boone is a 73 y.o. female with medical history significant for HTN, HLD, T2DM, TIA, atrial fibrillation, hypothyroidism, chronic diastolic CHF, anxiety, depression, obesity, chronic respiratory failure with hypoxia on 2-4 L, chronic bronchitis, COPD, and neuropathy who presented from [facility] for evaluation of shortness of breath and hypoxia and admitted for acute on chronic hypoxic and hypercarbic respiratory failure.  # Acute on  chronic hypoxic and hypercarbic respiratory failure - Presented with *** - Found to have new O2 requirement of *** - This is secondary to *** - Continue supplemental O2, wean as able  # COPD exacerbation - Presented with *** - Pt with *** on lung auscultation - CXR with no evidence of PNA; Neg covid, RSV and flu test - S/p IV solumedrol, *** in the ED - Start prednisone  40 mg daily - Start daily azithromycin  *** - Continue home bronchodilator - As needed duonebs - Supplemental O2 as needed  #***  # HTN  # Hypothyroidism  # History of TIA  # Anxiety and depression  DVT prophylaxis: Eliquis     Code Status: Prior  Consults called: None  Family Communication: ***  Severity of Illness: The appropriate patient status for this patient is INPATIENT. Inpatient status is judged to be reasonable and necessary in order to provide the required intensity of service to ensure the patient's safety. The patient's presenting symptoms, physical exam findings, and initial radiographic and laboratory data in the context of their chronic comorbidities is felt to place them at high risk for further clinical deterioration. Furthermore, it is not anticipated that the patient will be medically stable for discharge from the hospital within 2  midnights of admission.   * I certify that at the point of admission it is my clinical judgment that the patient will require inpatient hospital care spanning beyond 2 midnights from the point of admission due to high intensity of service, high risk for further deterioration and high frequency of surveillance required.*  Level of care: Progressive    Lou Claretta HERO, MD 03/22/2024, 9:54 PM [facility] Pager: [phone] Isaiah 41:10   If 7PM-7AM, please contact night-coverage [email or web address] Password [email or web address]

## 2024-03-22 NOTE — ED Triage Notes (Addendum)
 Arrives [facility] urgently from [facility] for respiratory distress.   EMS report minimal story was provided.   Shortness of breath since this afternoon. 6L [place] baseline but found with spo2 in the 80s  Hx CHF, T2DM, COPD.   EMS admin: 3 duonebs 125mg  Solumedrol  2 grams of mag started but minimally infused

## 2024-03-23 DIAGNOSIS — J9621 Acute and chronic respiratory failure with hypoxia: Secondary | ICD-10-CM | POA: Diagnosis not present

## 2024-03-23 DIAGNOSIS — N3000 Acute cystitis without hematuria: Secondary | ICD-10-CM

## 2024-03-23 DIAGNOSIS — R0689 Other abnormalities of breathing: Secondary | ICD-10-CM | POA: Insufficient documentation

## 2024-03-23 DIAGNOSIS — J9622 Acute and chronic respiratory failure with hypercapnia: Secondary | ICD-10-CM | POA: Diagnosis not present

## 2024-03-23 DIAGNOSIS — I480 Paroxysmal atrial fibrillation: Secondary | ICD-10-CM

## 2024-03-23 LAB — BLOOD CULTURE ID PANEL (REFLEXED) - BCID2

## 2024-03-23 LAB — COMPREHENSIVE METABOLIC PANEL WITH GFR
ALT: 16 U/L (ref 0–44)
AST: 18 U/L (ref 15–41)
Albumin: 2.9 g/dL — ABNORMAL LOW (ref 3.5–5.0)
Alkaline Phosphatase: 72 U/L (ref 38–126)
Anion gap: 11 (ref 5–15)
BUN: 23 mg/dL (ref 8–23)
CO2: 40 mmol/L — ABNORMAL HIGH (ref 22–32)
Calcium: 8.4 mg/dL — ABNORMAL LOW (ref 8.9–10.3)
Chloride: 91 mmol/L — ABNORMAL LOW (ref 98–111)
Creatinine, Ser: 1.33 mg/dL — ABNORMAL HIGH (ref 0.44–1.00)
GFR, Estimated: 42 mL/min — ABNORMAL LOW (ref >60)
Glucose, Bld: 172 mg/dL — ABNORMAL HIGH (ref 70–99)
Potassium: 4.5 mmol/L (ref 3.5–5.1)
Sodium: 142 mmol/L (ref 135–145)
Total Bilirubin: 0.9 mg/dL (ref 0.0–1.2)
Total Protein: 7.2 g/dL (ref 6.5–8.1)

## 2024-03-23 LAB — I-STAT ARTERIAL BLOOD GAS, ED
Acid-Base Excess: 17 mmol/L — ABNORMAL HIGH (ref 0.0–2.0)
Bicarbonate: 45.7 mmol/L — ABNORMAL HIGH (ref 20.0–28.0)
Calcium, Ion: 1.12 mmol/L — ABNORMAL LOW (ref 1.15–1.40)
HCT: 31 % — ABNORMAL LOW (ref 36.0–46.0)
Hemoglobin: 10.5 g/dL — ABNORMAL LOW (ref 12.0–15.0)
O2 Saturation: 92 %
Patient temperature: 37
Potassium: 4.2 mmol/L (ref 3.5–5.1)
Sodium: 141 mmol/L (ref 135–145)
TCO2: 48 mmol/L — ABNORMAL HIGH (ref 22–32)
pCO2 arterial: 77.4 mmHg (ref 32–48)
pH, Arterial: 7.379 (ref 7.35–7.45)
pO2, Arterial: 70 mmHg — ABNORMAL LOW (ref 83–108)

## 2024-03-23 LAB — PROCALCITONIN: Procalcitonin: <0.1 ng/mL

## 2024-03-23 LAB — RESPIRATORY PANEL BY PCR

## 2024-03-23 LAB — CBC
HCT: 33 % — ABNORMAL LOW (ref 36.0–46.0)
Hemoglobin: 9.4 g/dL — ABNORMAL LOW (ref 12.0–15.0)
MCH: 30.6 pg (ref 26.0–34.0)
MCHC: 28.5 g/dL — ABNORMAL LOW (ref 30.0–36.0)
MCV: 107.5 fL — ABNORMAL HIGH (ref 80.0–100.0)
Platelets: 257 K/uL (ref 150–400)
RBC: 3.07 MIL/uL — ABNORMAL LOW (ref 3.87–5.11)
RDW: 16.6 % — ABNORMAL HIGH (ref 11.5–15.5)
WBC: 8.4 K/uL (ref 4.0–10.5)
nRBC: 0 % (ref 0.0–0.2)

## 2024-03-23 LAB — CBG MONITORING, ED
Glucose-Capillary: 184 mg/dL — ABNORMAL HIGH (ref 70–99)
Glucose-Capillary: 218 mg/dL — ABNORMAL HIGH (ref 70–99)

## 2024-03-23 LAB — MAGNESIUM: Magnesium: 2.2 mg/dL (ref 1.7–2.4)

## 2024-03-23 LAB — BASIC METABOLIC PANEL WITH GFR
Anion gap: 13 (ref 5–15)
BUN: 25 mg/dL — ABNORMAL HIGH (ref 8–23)
CO2: 40 mmol/L — ABNORMAL HIGH (ref 22–32)
Calcium: 8.6 mg/dL — ABNORMAL LOW (ref 8.9–10.3)
Chloride: 88 mmol/L — ABNORMAL LOW (ref 98–111)
Creatinine, Ser: 1.24 mg/dL — ABNORMAL HIGH (ref 0.44–1.00)
GFR, Estimated: 46 mL/min — ABNORMAL LOW (ref >60)
Glucose, Bld: 191 mg/dL — ABNORMAL HIGH (ref 70–99)
Potassium: 4.4 mmol/L (ref 3.5–5.1)
Sodium: 141 mmol/L (ref 135–145)

## 2024-03-23 LAB — GLUCOSE, CAPILLARY
Glucose-Capillary: 161 mg/dL — ABNORMAL HIGH (ref 70–99)
Glucose-Capillary: 131 mg/dL — ABNORMAL HIGH (ref 70–99)

## 2024-03-23 MED ORDER — METOPROLOL SUCCINATE ER 25 MG PO TB24
25.0000 mg | ORAL_TABLET | Freq: Every day | ORAL | Status: DC
  Administered 2024-03-23 – 2024-03-28 (×6): 25 mg via ORAL
  Filled 2024-03-23 (×6): qty 1

## 2024-03-23 MED ORDER — LINAGLIPTIN 5 MG PO TABS
5.0000 mg | ORAL_TABLET | Freq: Every day | ORAL | Status: DC
  Administered 2024-03-23 – 2024-03-28 (×6): 5 mg via ORAL
  Filled 2024-03-23 (×6): qty 1

## 2024-03-23 MED ORDER — METHYLPREDNISOLONE SODIUM SUCC 125 MG IJ SOLR
80.0000 mg | INTRAMUSCULAR | Status: DC
  Administered 2024-03-23 – 2024-03-25 (×3): 80 mg via INTRAVENOUS
  Filled 2024-03-23 (×3): qty 2

## 2024-03-23 MED ORDER — BUPROPION HCL 75 MG PO TABS
75.0000 mg | ORAL_TABLET | Freq: Every day | ORAL | Status: DC
  Administered 2024-03-23 – 2024-03-28 (×6): 75 mg via ORAL
  Filled 2024-03-23 (×6): qty 1

## 2024-03-23 MED ORDER — BUDESONIDE 0.25 MG/2ML IN SUSP
0.2500 mg | Freq: Two times a day (BID) | RESPIRATORY_TRACT | Status: DC
  Administered 2024-03-23 – 2024-03-28 (×11): 0.25 mg via RESPIRATORY_TRACT
  Filled 2024-03-23 (×11): qty 2

## 2024-03-23 MED ORDER — CYCLOBENZAPRINE HCL 10 MG PO TABS
5.0000 mg | ORAL_TABLET | Freq: Every day | ORAL | Status: DC | PRN
  Administered 2024-03-25 – 2024-03-27 (×4): 5 mg via ORAL
  Filled 2024-03-23 (×5): qty 1

## 2024-03-23 MED ORDER — UMECLIDINIUM-VILANTEROL 62.5-25 MCG/ACT IN AEPB
1.0000 | INHALATION_SPRAY | Freq: Every day | RESPIRATORY_TRACT | Status: DC
  Administered 2024-03-25 – 2024-03-28 (×4): 1 via RESPIRATORY_TRACT
  Filled 2024-03-23: qty 14

## 2024-03-23 MED ORDER — FUROSEMIDE 10 MG/ML IJ SOLN
40.0000 mg | Freq: Two times a day (BID) | INTRAMUSCULAR | Status: DC
  Administered 2024-03-23 – 2024-03-25 (×5): 40 mg via INTRAVENOUS
  Filled 2024-03-23 (×6): qty 4

## 2024-03-23 MED ORDER — SODIUM CHLORIDE 0.9 % IV SOLN
500.0000 mg | INTRAVENOUS | Status: AC
  Administered 2024-03-23 – 2024-03-24 (×2): 500 mg via INTRAVENOUS
  Filled 2024-03-23 (×2): qty 5

## 2024-03-23 MED ORDER — SODIUM CHLORIDE 0.9 % IV SOLN
2.0000 g | INTRAVENOUS | Status: DC
  Administered 2024-03-24 – 2024-03-25 (×3): 2 g via INTRAVENOUS
  Filled 2024-03-23 (×3): qty 20

## 2024-03-23 MED ORDER — CEFTRIAXONE SODIUM 1 G IJ SOLR
1.0000 g | Freq: Once | INTRAMUSCULAR | Status: AC
  Administered 2024-03-23: 1 g via INTRAVENOUS
  Filled 2024-03-23: qty 10

## 2024-03-23 MED ORDER — LEVOTHYROXINE SODIUM 100 MCG PO TABS
200.0000 ug | ORAL_TABLET | Freq: Every day | ORAL | Status: DC
  Administered 2024-03-24 – 2024-03-28 (×5): 200 ug via ORAL
  Filled 2024-03-23 (×5): qty 2

## 2024-03-23 MED ORDER — APIXABAN 5 MG PO TABS
5.0000 mg | ORAL_TABLET | Freq: Two times a day (BID) | ORAL | Status: DC
  Administered 2024-03-23 – 2024-03-28 (×11): 5 mg via ORAL
  Filled 2024-03-23 (×11): qty 1

## 2024-03-23 MED ORDER — BUSPIRONE HCL 5 MG PO TABS
7.5000 mg | ORAL_TABLET | Freq: Two times a day (BID) | ORAL | Status: DC
  Administered 2024-03-23 – 2024-03-28 (×11): 7.5 mg via ORAL
  Filled 2024-03-23 (×6): qty 2
  Filled 2024-03-23 (×2): qty 1
  Filled 2024-03-23 (×4): qty 2

## 2024-03-23 MED ORDER — SODIUM CHLORIDE 0.9 % IV SOLN: 1.0000 g | INTRAVENOUS | Status: DC

## 2024-03-23 NOTE — Progress Notes (Signed)
 Patient transported from the ED to room 6E15 on Bipap with no problems.

## 2024-03-23 NOTE — ED Notes (Signed)
 Lab called to add on 20 Panel resp swab to swab that was sent down earlier in the day. Lab agreed to add on this lab.

## 2024-03-23 NOTE — Progress Notes (Signed)
 History and Physical  Glenda Boone FMW:[id number] DOB: 1950-07-11 DOA: 03/22/2024  PCP: Feliciano Devoria LABOR, MD   Chief Complaint: Shortness of breath, hypoxia  Brief HPI: Glenda Boone is a 74 y.o. female with medical history significant for HTN, HLD, T2DM, TIA, atrial fibrillation on Eliquis , hypothyroidism, chronic diastolic CHF, anxiety, depression, obesity, chronic respiratory failure with hypoxia on 2-4 L, chronic bronchitis, COPD, and neuropathy who presented from [facility] for evaluation of shortness of breath and hypoxia.  She was hypoxic with SpO2 in the 80s on 6 L [place].Patient evaluated at bedside and is somnolent on BiPAP therapy. Family  state that  patient's BiPAP has been broken over the last 1-2 weeks at her facility.  ABG showed  pH 7.26, pCO2 101.5, pO2 68 and bicarb 46.5, negative flu, RSV and COVID test. CXR shows increasing retrocardiac opacity concerning for atelectasis versus pneumonia, patchy left suprahilar opacity. Pt received IV Lasix  40 mg x 1, DuoNeb, IV azithromycin  and placed on BiPAP. TRH was consulted for admission    Physical Exam: BP (!) 151/75 (BP Location: Right Wrist)   Pulse 66   Temp 97.8 F (36.6 C) (Axillary)   Resp 20   SpO2 98%  General: Somnolent morbidly obese woman on BiPAP. No acute distress. HEENT: [place]/AT. Anicteric sclera CV: RRR. No murmurs, rubs, or gallops.  Pulmonary: On BiPAP. Lungs CTAB. Decreased breath sounds throughout. Abdominal: Soft, nontender, nondistended. Normal bowel sounds. Extremities: 2+ BLE edema. Mild intermittent spasms of her extremities. 1+ pedal pulses. Skin: Warm and dry. Venous stasis changes to the lower extremities. Neuro:More alert AO x2 Generalized weakness. Moves all extremities. Normal sensation to light touch. No focal deficit.          Labs on Admission:  Basic Metabolic Panel: Recent Labs  Lab 03/22/24 2013 03/22/24 2343 03/23/24 0326 03/23/24 0536  NA 140 142 141 141  K 4.5 4.5 4.4 4.2   CL  --  91* 88*  --   CO2  --  40* 40*  --   GLUCOSE  --  172* 191*  --   BUN  --  23 25*  --   CREATININE  --  1.33* 1.24*  --   CALCIUM   --  8.4* 8.6*  --   MG  --   --  2.2  --    Liver Function Tests: Recent Labs  Lab 03/22/24 2343  AST 18  ALT 16  ALKPHOS 72  BILITOT 0.9  PROT 7.2  ALBUMIN 2.9*   No results for input(s): LIPASE, AMYLASE in the last 168 hours. No results for input(s): AMMONIA in the last 168 hours. CBC: Recent Labs  Lab 03/22/24 2004 03/22/24 2013 03/23/24 0326 03/23/24 0536  WBC 10.1  --  8.4  --   NEUTROABS 8.1*  --   --   --   HGB 10.3* 11.2* 9.4* 10.5*  HCT 36.4 33.0* 33.0* 31.0*  MCV 106.7*  --  107.5*  --   PLT 283  --  257  --    Cardiac Enzymes: No results for input(s): CKTOTAL, CKMB, CKMBINDEX, TROPONINI in the last 168 hours. BNP (last 3 results) Recent Labs    01/14/24 0413 01/15/24 0531 03/22/24 2004  BNP 58.1 126.0* 114.6*    ProBNP (last 3 results) No results for input(s): PROBNP in the last 8760 hours.  CBG: Recent Labs  Lab 03/22/24 2340 03/23/24 0754 03/23/24 1147  GLUCAP 134* 184* 218*    Radiological Exams on Admission: DG Chest  Portable 1 View Result Date: 03/22/2024 CLINICAL DATA:  COPD.  Heart failure. EXAM: PORTABLE CHEST 1 VIEW COMPARISON:  Radiograph earlier today FINDINGS: Unchanged elevation of right hemidiaphragm. Persistent low lung volumes with coarse lung markings. Stable blunting of the costophrenic angles. Query increasing retrocardiac opacity. Patchy left suprahilar patchy. Stable heart size and mediastinal contours. No pneumothorax. IMPRESSION: Persistent low lung volumes with coarse lung markings. Query increasing retrocardiac opacity, atelectasis versus pneumonia. Patchy left suprahilar opacity. Electronically Signed   By: Andrea Gasman M.D.   On: 03/22/2024 21:44   DG Chest Portable 1 View Result Date: 03/22/2024 EXAM: 1 VIEW(S) XRAY OF THE CHEST 03/22/2024 08:57:00 PM  COMPARISON: Chest x-ray 01/12/2024, CT chest 01/12/2024. CLINICAL HISTORY: Encounter for heart failure and COPD. FINDINGS: LUNGS AND PLEURA: Low lung volumes with chronic coarsening interstitial markings. Nonspecific blunting of bilateral costophrenic angles. No focal pulmonary opacity. No pneumothorax. HEART AND MEDIASTINUM: Unchanged cardiomediastinal silhouette. Atherosclerotic plaque. BONES AND SOFT TISSUES: No acute osseous abnormality. IMPRESSION: 1. Low lung volumes.Recommend repeat chest x-ray PA and lateral view with improved inspiratory effort for further evaluation. Electronically signed by: Morgane Naveau MD 03/22/2024 09:03 PM EDT RP Workstation: [id number]   My independent interpretation of EKG: Sinus rhythm with slightly prolonged QTc of 479  Assessment/Plan Glenda Boone is a 73 y.o. female with medical history significant for HTN, HLD, T2DM, TIA, atrial fibrillation on Eliquis , hypothyroidism, chronic diastolic CHF, anxiety, depression, obesity, chronic respiratory failure with hypoxia on 2-4 L, chronic bronchitis, COPD, and neuropathy who presented from [facility] for evaluation of shortness of breath and hypoxia and admitted for acute on chronic hypoxic and hypercarbic respiratory failure.  # Acute on chronic hypoxic and hypercarbic respiratory failure # Hypercarbia - Patient with history of COPD presenting with progressive shortness of breath and lethargy - pCO2 elevated to 101 at admission improved to 77, mental status improving -Continue on BiPAP therapy -  Hypercarbia due to malfunctioned home BiPAP - Incentive spirometer, flutter valve -Mental Status has improved -ABG in the morning  # COPD exacerbation - Pt with decreased breath sounds on lung auscultation - CXR with no evidence of PNA; Neg covid, RSV and flu test - S/p IV solumedrol, IV mag and multiple DuoNebs by EMS - Start IV Solu-Medrol  40 mg every 12 hours - Continue azithromycin  x 3 doses - Continue home  bronchodilators - As needed duonebs  # Acute on chronic diastolic HF - Last TTE on 3/25 showed EF 70-75%, moderate LVH, G2 DD, moderately dilated LA - Pt presented with shortness of breath and hypoxia - Patient looks slightly volume up although volume status difficult to assess due to body habitus - Start IV lasix  40 mg twice daily - Strict I&O, daily weights - Maintain K+ > 4.0, Mag > 2.0 - Telemetry  # Acute cystitis - Pt presented with generalized weakness lethargy - Urinalysis shows signs of infection - Patient sent to UTIs in the last 2 months likely secondary to her SGLT2 inhibitor - Continue IV rocephin  - F/u urine and blood cultures - Trend CBC and fever curve  # T2DM - Repeat A1c improved to 6.3% on admission - SSI with meals, CBG monitoring - Continue linagliptin  and discontinue Farxiga  in the setting of  UTIs.  # HTN - BP stable with SBP in the 100-130s - Continue metoprolol   # A-fib - Rate controlled, remains in sinus rhythm - Continue Eliquis  and metoprolol   # Hypothyroidism - Continue Synthroid   # History of TIA - Continue Eliquis   #  Anxiety and depression - Continue bupropion  and BuSpar   # Morbid obesity There is no height or weight on file to calculate BMI. There were no vitals filed for this visit. - Check weight - F/u with PCP for weight lost and nutrition counseling   DVT prophylaxis: Eliquis     Code Status: Limited: Do not attempt resuscitation (DNR) -DNR-LIMITED -Do Not Intubate/DNI   Consults called: None  Family Communication: Discussed admission with grandson over the phone  Level of care: Progressive    Onika Gudiel, Landon BRAVO, MD 03/23/2024, 2:11 PM [facility] Pager: [phone] [place] 41:10   If 7PM-7AM, please contact night-coverage [email or web address] Password [email or web address]

## 2024-03-23 NOTE — Progress Notes (Signed)
 Pt transported on bipap from [facility] to ER-8 w no complications.

## 2024-03-23 NOTE — Progress Notes (Signed)
 Patient placed back on BIPAP

## 2024-03-23 NOTE — Progress Notes (Addendum)
 Patient trialed off BIPAP on 6L [place]. Patient tolerating well at this time saturating 91%, mildly labored breathing, complaining of wanting to eat and drink. RT assured patient if she does well off of BIPAP we can talk about eating and drinking.

## 2024-03-23 NOTE — Progress Notes (Signed)
 PHARMACY - PHYSICIAN COMMUNICATION CRITICAL VALUE ALERT - BLOOD CULTURE IDENTIFICATION (BCID)  Glenda Boone is an 73 y.o. female who presented to [facility] on 03/22/2024 with a chief complaint of COPD exacerbation.  Assessment:  1 of 3 bottles (anaerobic) GPC, staph EPI MecA positive. Likely contaminant.  Name of physician (or Provider) Contacted: Dr. Marcene  Current antibiotics: Ceftriaxone , Azithromycin   Changes to prescribed antibiotics recommended:  Patient is on recommended antibiotics - No changes needed at this time given likely contaminant, low WBC and afebrile.  Results for orders placed or performed during the hospital encounter of 03/22/24  Blood Culture ID Panel (Reflexed) (Collected: 03/22/2024  8:04 PM)  Result Value Ref Range   Enterococcus faecalis NOT DETECTED NOT DETECTED   Enterococcus Faecium NOT DETECTED NOT DETECTED   Listeria monocytogenes NOT DETECTED NOT DETECTED   Staphylococcus species DETECTED (A) NOT DETECTED   Staphylococcus aureus (BCID) NOT DETECTED NOT DETECTED   Staphylococcus epidermidis DETECTED (A) NOT DETECTED   Staphylococcus lugdunensis NOT DETECTED NOT DETECTED   Streptococcus species NOT DETECTED NOT DETECTED   Streptococcus agalactiae NOT DETECTED NOT DETECTED   Streptococcus pneumoniae NOT DETECTED NOT DETECTED   Streptococcus pyogenes NOT DETECTED NOT DETECTED   A.calcoaceticus-baumannii NOT DETECTED NOT DETECTED   Bacteroides fragilis NOT DETECTED NOT DETECTED   Enterobacterales NOT DETECTED NOT DETECTED   Enterobacter cloacae complex NOT DETECTED NOT DETECTED   Escherichia coli NOT DETECTED NOT DETECTED   Klebsiella aerogenes NOT DETECTED NOT DETECTED   Klebsiella oxytoca NOT DETECTED NOT DETECTED   Klebsiella pneumoniae NOT DETECTED NOT DETECTED   Proteus species NOT DETECTED NOT DETECTED   Salmonella species NOT DETECTED NOT DETECTED   Serratia marcescens NOT DETECTED NOT DETECTED   Haemophilus influenzae NOT DETECTED NOT  DETECTED   Neisseria meningitidis NOT DETECTED NOT DETECTED   Pseudomonas aeruginosa NOT DETECTED NOT DETECTED   Stenotrophomonas maltophilia NOT DETECTED NOT DETECTED   Candida albicans NOT DETECTED NOT DETECTED   Candida auris NOT DETECTED NOT DETECTED   Candida glabrata NOT DETECTED NOT DETECTED   Candida krusei NOT DETECTED NOT DETECTED   Candida parapsilosis NOT DETECTED NOT DETECTED   Candida tropicalis NOT DETECTED NOT DETECTED   Cryptococcus neoformans/gattii NOT DETECTED NOT DETECTED   Methicillin resistance mecA/C DETECTED (A) NOT DETECTED    Glenda Boone 03/23/2024  8:03 PM

## 2024-03-23 NOTE — Plan of Care (Signed)

## 2024-03-24 DIAGNOSIS — J9621 Acute and chronic respiratory failure with hypoxia: Secondary | ICD-10-CM | POA: Diagnosis not present

## 2024-03-24 DIAGNOSIS — J9622 Acute and chronic respiratory failure with hypercapnia: Secondary | ICD-10-CM | POA: Diagnosis not present

## 2024-03-24 LAB — COMPREHENSIVE METABOLIC PANEL WITH GFR
ALT: 18 U/L (ref 0–44)
AST: 33 U/L (ref 15–41)
Albumin: 3.1 g/dL — ABNORMAL LOW (ref 3.5–5.0)
Alkaline Phosphatase: 65 U/L (ref 38–126)
Anion gap: 14 (ref 5–15)
BUN: 29 mg/dL — ABNORMAL HIGH (ref 8–23)
CO2: 38 mmol/L — ABNORMAL HIGH (ref 22–32)
Calcium: 8.4 mg/dL — ABNORMAL LOW (ref 8.9–10.3)
Chloride: 87 mmol/L — ABNORMAL LOW (ref 98–111)
Creatinine, Ser: 1.44 mg/dL — ABNORMAL HIGH (ref 0.44–1.00)
GFR, Estimated: 38 mL/min — ABNORMAL LOW (ref >60)
Glucose, Bld: 188 mg/dL — ABNORMAL HIGH (ref 70–99)
Potassium: 4.5 mmol/L (ref 3.5–5.1)
Sodium: 139 mmol/L (ref 135–145)
Total Bilirubin: 0.5 mg/dL (ref 0.0–1.2)
Total Protein: 6.8 g/dL (ref 6.5–8.1)

## 2024-03-24 LAB — GLUCOSE, CAPILLARY
Glucose-Capillary: 171 mg/dL — ABNORMAL HIGH (ref 70–99)
Glucose-Capillary: 178 mg/dL — ABNORMAL HIGH (ref 70–99)
Glucose-Capillary: 183 mg/dL — ABNORMAL HIGH (ref 70–99)
Glucose-Capillary: 173 mg/dL — ABNORMAL HIGH (ref 70–99)

## 2024-03-24 LAB — BLOOD GAS, ARTERIAL
Acid-Base Excess: 26.1 mmol/L — ABNORMAL HIGH (ref 0.0–2.0)
Bicarbonate: 53.5 mmol/L — ABNORMAL HIGH (ref 20.0–28.0)
Drawn by: 23604
O2 Saturation: 97 %
Patient temperature: 37
pCO2 arterial: 64 mmHg — ABNORMAL HIGH (ref 32–48)
pH, Arterial: 7.53 — ABNORMAL HIGH (ref 7.35–7.45)
pO2, Arterial: 73 mmHg — ABNORMAL LOW (ref 83–108)

## 2024-03-24 LAB — CBC
HCT: 33.9 % — ABNORMAL LOW (ref 36.0–46.0)
Hemoglobin: 10.1 g/dL — ABNORMAL LOW (ref 12.0–15.0)
MCH: 30.3 pg (ref 26.0–34.0)
MCHC: 29.8 g/dL — ABNORMAL LOW (ref 30.0–36.0)
MCV: 101.8 fL — ABNORMAL HIGH (ref 80.0–100.0)
Platelets: 259 K/uL (ref 150–400)
RBC: 3.33 MIL/uL — ABNORMAL LOW (ref 3.87–5.11)
RDW: 16.8 % — ABNORMAL HIGH (ref 11.5–15.5)
WBC: 7.2 K/uL (ref 4.0–10.5)
nRBC: 0 % (ref 0.0–0.2)

## 2024-03-24 NOTE — Progress Notes (Signed)
 History and Physical  KITA NEACE FMW:[id number] DOB: [birth year] DOA: 03/22/2024  PCP: Feliciano Devoria LABOR, MD   Chief Complaint: Shortness of breath, hypoxia  Brief HPI: Glenda Boone is a 73 y.o. female with medical history significant for HTN, HLD, T2DM, TIA, atrial fibrillation on Eliquis , hypothyroidism, chronic diastolic CHF, anxiety, depression, obesity, chronic respiratory failure with hypoxia on 2-4 L, chronic bronchitis, COPD, and neuropathy who presented from [facility] for evaluation of shortness of breath and hypoxia.  She was hypoxic with SpO2 in the 80s on 6 L [place].Patient evaluated at bedside and is somnolent on BiPAP therapy. Family  state that  patient's BiPAP has been broken over the last 1-2 weeks at her facility.  ABG showed  pH 7.26, pCO2 101.5, pO2 68 and bicarb 46.5, negative flu, RSV and COVID test. CXR shows increasing retrocardiac opacity concerning for atelectasis versus pneumonia, patchy left suprahilar opacity. Pt received IV Lasix  40 mg x 1, DuoNeb, IV azithromycin  and placed on BiPAP. TRH was consulted for admission  Subjective-Seen at bedside, she is alert and oriented off bipap, she is much improved.     Physical Exam: BP (!) 160/77 (BP Location: Left Wrist)   Pulse 67   Temp 98.3 F (36.8 C) (Oral)   Resp 18   Wt (!) 163.8 kg   SpO2 100%   BMI 61.99 kg/m  General: Alert morbidly obese woman on BiPAP. No acute distress. HEENT: [place]/AT. Anicteric sclera CV: RRR. No murmurs, rubs, or gallops.  Pulmonary: On BiPAP. Lungs CTAB. Decreased breath sounds throughout. Abdominal: Soft, nontender, nondistended. Normal bowel sounds. Extremities: 2+ BLE edema. Mild intermittent spasms of her extremities. 1+ pedal pulses. Skin: Warm and dry. Venous stasis changes to the lower extremities. Neuro:More alert AO x3 Generalized weakness. Moves all extremities. Normal sensation to light touch. No focal deficit.          Labs on Admission:  Basic Metabolic  Panel: Recent Labs  Lab 03/22/24 2013 03/22/24 2343 03/23/24 0326 03/23/24 0536 03/24/24 1226  NA 140 142 141 141 139  K 4.5 4.5 4.4 4.2 4.5  CL  --  91* 88*  --  87*  CO2  --  40* 40*  --  38*  GLUCOSE  --  172* 191*  --  188*  BUN  --  23 25*  --  29*  CREATININE  --  1.33* 1.24*  --  1.44*  CALCIUM   --  8.4* 8.6*  --  8.4*  MG  --   --  2.2  --   --    Liver Function Tests: Recent Labs  Lab 03/22/24 2343 03/24/24 1226  AST 18 33  ALT 16 18  ALKPHOS 72 65  BILITOT 0.9 0.5  PROT 7.2 6.8  ALBUMIN 2.9* 3.1*   No results for input(s): LIPASE, AMYLASE in the last 168 hours. No results for input(s): AMMONIA in the last 168 hours. CBC: Recent Labs  Lab 03/22/24 2004 03/22/24 2013 03/23/24 0326 03/23/24 0536 03/24/24 1226  WBC 10.1  --  8.4  --  7.2  NEUTROABS 8.1*  --   --   --   --   HGB 10.3* 11.2* 9.4* 10.5* 10.1*  HCT 36.4 33.0* 33.0* 31.0* 33.9*  MCV 106.7*  --  107.5*  --  101.8*  PLT 283  --  257  --  259   Cardiac Enzymes: No results for input(s): CKTOTAL, CKMB, CKMBINDEX, TROPONINI in the last 168 hours. BNP (last 3 results) Recent  Labs    01/14/24 0413 01/15/24 0531 03/22/24 2004  BNP 58.1 126.0* 114.6*    ProBNP (last 3 results) No results for input(s): PROBNP in the last 8760 hours.  CBG: Recent Labs  Lab 03/23/24 1147 03/23/24 1741 03/23/24 2117 03/24/24 0826 03/24/24 1202  GLUCAP 218* 161* 131* 171* 178*    Radiological Exams on Admission: DG Chest Portable 1 View Result Date: 03/22/2024 CLINICAL DATA:  COPD.  Heart failure. EXAM: PORTABLE CHEST 1 VIEW COMPARISON:  Radiograph earlier today FINDINGS: Unchanged elevation of right hemidiaphragm. Persistent low lung volumes with coarse lung markings. Stable blunting of the costophrenic angles. Query increasing retrocardiac opacity. Patchy left suprahilar patchy. Stable heart size and mediastinal contours. No pneumothorax. IMPRESSION: Persistent low lung volumes with  coarse lung markings. Query increasing retrocardiac opacity, atelectasis versus pneumonia. Patchy left suprahilar opacity. Electronically Signed   By: Andrea Gasman M.D.   On: 03/22/2024 21:44   DG Chest Portable 1 View Result Date: 03/22/2024 EXAM: 1 VIEW(S) XRAY OF THE CHEST 03/22/2024 08:57:00 PM COMPARISON: Chest x-ray 01/12/2024, CT chest 01/12/2024. CLINICAL HISTORY: Encounter for heart failure and COPD. FINDINGS: LUNGS AND PLEURA: Low lung volumes with chronic coarsening interstitial markings. Nonspecific blunting of bilateral costophrenic angles. No focal pulmonary opacity. No pneumothorax. HEART AND MEDIASTINUM: Unchanged cardiomediastinal silhouette. Atherosclerotic plaque. BONES AND SOFT TISSUES: No acute osseous abnormality. IMPRESSION: 1. Low lung volumes.Recommend repeat chest x-ray PA and lateral view with improved inspiratory effort for further evaluation. Electronically signed by: Morgane Naveau MD 03/22/2024 09:03 PM EDT RP Workstation: [id number]   My independent interpretation of EKG: Sinus rhythm with slightly prolonged QTc of 479  Assessment/Plan Glenda Boone is a 73 y.o. female with medical history significant for HTN, HLD, T2DM, TIA, atrial fibrillation on Eliquis , hypothyroidism, chronic diastolic CHF, anxiety, depression, obesity, chronic respiratory failure with hypoxia on 2-4 L, chronic bronchitis, COPD, and neuropathy who presented from [facility] for evaluation of shortness of breath and hypoxia and admitted for acute on chronic hypoxic and hypercarbic respiratory failure.  # Acute on chronic hypoxic and hypercarbic respiratory failure # Hypercarbia - Patient with history of COPD presenting with progressive shortness of breath and lethargy - pCO2 elevated to 101 at admission improved to 64  mental status improving Off bipap on Nasal cannula -Continue on BiPAP therapy with naps and bedtime -  Hypercarbia due to malfunctioned home BiPAP - Incentive spirometer,  flutter valve -Mental Status has improved -ABG reviewed  # COPD exacerbation - Pt with decreased breath sounds on lung auscultation - CXR with no evidence of PNA; Neg covid, RSV and flu test - S/p IV solumedrol, IV mag and multiple DuoNebs by EMS - Start IV Solu-Medrol  40 mg every 12 hours - Continue azithromycin  x 3 doses - Continue home bronchodilators - As needed duonebs  # Acute on chronic diastolic HF - Last TTE on 3/25 showed EF 70-75%, moderate LVH, G2 DD, moderately dilated LA - Pt presented with shortness of breath and hypoxia - Patient looks slightly volume up although volume status difficult to assess due to body habitus - Start IV lasix  40 mg twice daily - Strict I&O, daily weights - Maintain K+ > 4.0, Mag > 2.0 - Telemetry  # Acute cystitis - Pt presented with generalized weakness lethargy - Urinalysis shows signs of infection - Patient sent to UTIs in the last 2 months likely secondary to her SGLT2 inhibitor - Continue IV rocephin  - F/u urine and blood cultures - Trend CBC and fever curve  #  T2DM - Repeat A1c improved to 6.3% on admission - SSI with meals, CBG monitoring - Continue linagliptin  and discontinue Farxiga  in the setting of  UTIs.  # HTN - BP stable with SBP in the 100-130s - Continue metoprolol   # A-fib - Rate controlled, remains in sinus rhythm - Continue Eliquis  and metoprolol   # Hypothyroidism - Continue Synthroid   # History of TIA - Continue Eliquis   # Anxiety and depression - Continue bupropion  and BuSpar   # Morbid obesity Body mass index is 61.99 kg/m. Filed Weights   03/24/24 0500  Weight: (!) 163.8 kg   - Check weight - F/u with PCP for weight lost and nutrition counseling   DVT prophylaxis: Eliquis     Code Status: Limited: Do not attempt resuscitation (DNR) -DNR-LIMITED -Do Not Intubate/DNI   Consults called: None  Family Communication: Discussed admission with grandson over the phone  Level of care: Progressive     Daytona Hedman, Landon BRAVO, MD 03/24/2024, 1:50 PM [facility] Pager: [phone] Isaiah 41:10   If 7PM-7AM, please contact night-coverage [email or web address] Password [email or web address]

## 2024-03-24 NOTE — Plan of Care (Signed)

## 2024-03-24 NOTE — Progress Notes (Signed)
 Bipap on standby at bedside. NAD noted. Pt states she feels good and doesn't need at this time. Advised to call if she feels as if she needs to wear it throughout the night. Will cont to assess for need

## 2024-03-24 NOTE — Progress Notes (Signed)
 BIPAP on standby at bedside at this time. Patient in no current distress.

## 2024-03-25 DIAGNOSIS — J9621 Acute and chronic respiratory failure with hypoxia: Secondary | ICD-10-CM | POA: Diagnosis not present

## 2024-03-25 DIAGNOSIS — J9622 Acute and chronic respiratory failure with hypercapnia: Secondary | ICD-10-CM | POA: Diagnosis not present

## 2024-03-25 LAB — CBC WITH DIFFERENTIAL/PLATELET
Abs Immature Granulocytes: 0.05 K/uL (ref 0.00–0.07)
Basophils Absolute: 0 K/uL (ref 0.0–0.1)
Basophils Relative: 0 %
Eosinophils Absolute: 0.1 K/uL (ref 0.0–0.5)
Eosinophils Relative: 1 %
HCT: 29.2 % — ABNORMAL LOW (ref 36.0–46.0)
Hemoglobin: 8.7 g/dL — ABNORMAL LOW (ref 12.0–15.0)
Immature Granulocytes: 1 %
Lymphocytes Relative: 23 %
Lymphs Abs: 1.7 K/uL (ref 0.7–4.0)
MCH: 30.3 pg (ref 26.0–34.0)
MCHC: 29.8 g/dL — ABNORMAL LOW (ref 30.0–36.0)
MCV: 101.7 fL — ABNORMAL HIGH (ref 80.0–100.0)
Monocytes Absolute: 0.6 K/uL (ref 0.1–1.0)
Monocytes Relative: 8 %
Neutro Abs: 4.9 K/uL (ref 1.7–7.7)
Neutrophils Relative %: 67 %
Platelets: 270 K/uL (ref 150–400)
RBC: 2.87 MIL/uL — ABNORMAL LOW (ref 3.87–5.11)
RDW: 16.7 % — ABNORMAL HIGH (ref 11.5–15.5)
WBC: 7.3 K/uL (ref 4.0–10.5)
nRBC: 0 % (ref 0.0–0.2)

## 2024-03-25 LAB — COMPREHENSIVE METABOLIC PANEL WITH GFR
ALT: 17 U/L (ref 0–44)
AST: 25 U/L (ref 15–41)
Albumin: 2.7 g/dL — ABNORMAL LOW (ref 3.5–5.0)
Alkaline Phosphatase: 54 U/L (ref 38–126)
Anion gap: 11 (ref 5–15)
BUN: 32 mg/dL — ABNORMAL HIGH (ref 8–23)
CO2: 42 mmol/L — ABNORMAL HIGH (ref 22–32)
Calcium: 8.3 mg/dL — ABNORMAL LOW (ref 8.9–10.3)
Chloride: 86 mmol/L — ABNORMAL LOW (ref 98–111)
Creatinine, Ser: 1.37 mg/dL — ABNORMAL HIGH (ref 0.44–1.00)
GFR, Estimated: 41 mL/min — ABNORMAL LOW (ref >60)
Glucose, Bld: 104 mg/dL — ABNORMAL HIGH (ref 70–99)
Potassium: 3.7 mmol/L (ref 3.5–5.1)
Sodium: 139 mmol/L (ref 135–145)
Total Bilirubin: 0.5 mg/dL (ref 0.0–1.2)
Total Protein: 6.1 g/dL — ABNORMAL LOW (ref 6.5–8.1)

## 2024-03-25 LAB — CULTURE, BLOOD (ROUTINE X 2): Special Requests: ADEQUATE

## 2024-03-25 LAB — GLUCOSE, CAPILLARY
Glucose-Capillary: 168 mg/dL — ABNORMAL HIGH (ref 70–99)
Glucose-Capillary: 209 mg/dL — ABNORMAL HIGH (ref 70–99)
Glucose-Capillary: 249 mg/dL — ABNORMAL HIGH (ref 70–99)
Glucose-Capillary: 169 mg/dL — ABNORMAL HIGH (ref 70–99)

## 2024-03-25 LAB — URINE CULTURE: Culture: >=100000 — AB

## 2024-03-25 MED ORDER — FUROSEMIDE 10 MG/ML IJ SOLN
20.0000 mg | Freq: Two times a day (BID) | INTRAMUSCULAR | Status: DC
  Administered 2024-03-25 – 2024-03-26 (×2): 20 mg via INTRAVENOUS
  Filled 2024-03-25 (×2): qty 2

## 2024-03-25 MED ORDER — METHYLPREDNISOLONE SODIUM SUCC 40 MG IJ SOLR
40.0000 mg | INTRAMUSCULAR | Status: DC
  Administered 2024-03-26: 40 mg via INTRAVENOUS
  Filled 2024-03-25: qty 1

## 2024-03-25 NOTE — Progress Notes (Signed)
 History and Physical  Glenda Boone FMW:[id number] DOB: Sep 10, 1950 DOA: 03/22/2024  PCP: Feliciano Devoria LABOR, MD   Chief Complaint: Shortness of breath, hypoxia  Brief HPI: Glenda Boone is a 73 y.o. female with medical history significant for HTN, HLD, T2DM, TIA, atrial fibrillation on Eliquis , hypothyroidism, chronic diastolic CHF, anxiety, depression, obesity, chronic respiratory failure with hypoxia on 2-4 L, chronic bronchitis, COPD, and neuropathy who presented from [facility] for evaluation of shortness of breath and hypoxia.  She was hypoxic with SpO2 in the 80s on 6 L [place].Patient evaluated at bedside and is somnolent on BiPAP therapy. Family  state that  patient's BiPAP has been broken over the last 1-2 weeks at her facility.  ABG showed  pH 7.26, pCO2 101.5, pO2 68 and bicarb 46.5, negative flu, RSV and COVID test. CXR shows increasing retrocardiac opacity concerning for atelectasis versus pneumonia, patchy left suprahilar opacity. Pt received IV Lasix  40 mg x 1, DuoNeb, IV azithromycin  and placed on BiPAP. TRH was consulted for admission  Subjective-Seen at bedside, she is alert and oriented off bipap, she is much improved. She is requesting to see PT    Physical Exam: BP (!) 149/72 (BP Location: Left Wrist)   Pulse 62   Temp 98.4 F (36.9 C) (Oral)   Resp 19   Wt (!) 160.7 kg   SpO2 92%   BMI 60.81 kg/m  General: Alert morbidly obese woman on BiPAP. No acute distress. HEENT: [place]/AT. Anicteric sclera CV: RRR. No murmurs, rubs, or gallops.  Pulmonary: Lungs CTAB. Decreased breath sounds throughout.ON [place] Abdominal: Soft, nontender, nondistended. Normal bowel sounds. Extremities: 2+ BLE edema. Mild intermittent spasms of her extremities. 1+ pedal pulses. Skin: Warm and dry. Venous stasis changes to the lower extremities. Neuro:More alert AO x3 Generalized weakness. Moves all extremities. Normal sensation to light touch. No focal deficit.           Basic Metabolic  Panel: Recent Labs  Lab 03/22/24 2343 03/23/24 0326 03/23/24 0536 03/24/24 1226 03/25/24 0441  NA 142 141 141 139 139  K 4.5 4.4 4.2 4.5 3.7  CL 91* 88*  --  87* 86*  CO2 40* 40*  --  38* 42*  GLUCOSE 172* 191*  --  188* 104*  BUN 23 25*  --  29* 32*  CREATININE 1.33* 1.24*  --  1.44* 1.37*  CALCIUM  8.4* 8.6*  --  8.4* 8.3*  MG  --  2.2  --   --   --    Liver Function Tests: Recent Labs  Lab 03/22/24 2343 03/24/24 1226 03/25/24 0441  AST 18 33 25  ALT 16 18 17   ALKPHOS 72 65 54  BILITOT 0.9 0.5 0.5  PROT 7.2 6.8 6.1*  ALBUMIN 2.9* 3.1* 2.7*   No results for input(s): LIPASE, AMYLASE in the last 168 hours. No results for input(s): AMMONIA in the last 168 hours. CBC: Recent Labs  Lab 03/22/24 2004 03/22/24 2013 03/23/24 0326 03/23/24 0536 03/24/24 1226 03/25/24 0441  WBC 10.1  --  8.4  --  7.2 7.3  NEUTROABS 8.1*  --   --   --   --  4.9  HGB 10.3* 11.2* 9.4* 10.5* 10.1* 8.7*  HCT 36.4 33.0* 33.0* 31.0* 33.9* 29.2*  MCV 106.7*  --  107.5*  --  101.8* 101.7*  PLT 283  --  257  --  259 270   Cardiac Enzymes: No results for input(s): CKTOTAL, CKMB, CKMBINDEX, TROPONINI in the last 168 hours.  BNP (last 3 results) Recent Labs    01/14/24 0413 01/15/24 0531 03/22/24 2004  BNP 58.1 126.0* 114.6*    ProBNP (last 3 results) No results for input(s): PROBNP in the last 8760 hours.  CBG: Recent Labs  Lab 03/24/24 1202 03/24/24 1615 03/24/24 2116 03/25/24 0812 03/25/24 1142  GLUCAP 178* 183* 173* 168* 209*    Radiological Exams on Admission: No results found.  My independent interpretation of EKG: Sinus rhythm with slightly prolonged QTc of 479  Assessment/Plan Glenda Boone is a 73 y.o. female with medical history significant for HTN, HLD, T2DM, TIA, atrial fibrillation on Eliquis , hypothyroidism, chronic diastolic CHF, anxiety, depression, obesity, chronic respiratory failure with hypoxia on 2-4 L, chronic bronchitis, COPD, and  neuropathy who presented from [facility] for evaluation of shortness of breath and hypoxia and admitted for acute on chronic hypoxic and hypercarbic respiratory failure.  # Acute on chronic hypoxic and hypercarbic respiratory failure # Hypercarbia - Patient with history of COPD presenting with progressive shortness of breath and lethargy - pCO2 elevated to 101 at admission improved to 64  mental status improving Off bipap on Nasal cannula -Continue on BiPAP therapy with naps and bedtime -  Hypercarbia due to malfunctioned home BiPAP - Incentive spirometer, flutter valve -Mental Status has improved -  # COPD exacerbation - Pt with decreased breath sounds on lung auscultation - CXR with no evidence of PNA; Neg covid, RSV and flu test - S/p IV solumedrol, IV mag and multiple DuoNebs by EMS - On IV Solu-Medrol  40 mg every 12 hours, wean as tolerated - Continue azithromycin  x 3 doses - Continue home bronchodilators - As needed duonebs  # Acute on chronic diastolic HF - Last TTE on 3/25 showed EF 70-75%, moderate LVH, G2 DD, moderately dilated LA - Pt presented with shortness of breath and hypoxia - Patient looks slightly volume up although volume status difficult to assess due to body habitus - Start IV lasix  40 mg twice daily - Strict I&O, daily weights - Maintain K+ > 4.0, Mag > 2.0 - Telemetry  # Acute cystitis - Pt presented with generalized weakness lethargy - Urinalysis shows signs of infection - Patient sent to UTIs in the last 2 months likely secondary to her SGLT2 inhibitor - Continue IV rocephin  - F/u urine and blood cultures - Trend CBC and fever curve  # T2DM - Repeat A1c improved to 6.3% on admission - SSI with meals, CBG monitoring - Continue linagliptin  and discontinue Farxiga  in the setting of  UTIs.  # HTN - BP stable with SBP in the 100-130s - Continue metoprolol   # A-fib - Rate controlled, remains in sinus rhythm - Continue Eliquis  and  metoprolol   # Hypothyroidism - Continue Synthroid   # History of TIA - Continue Eliquis   # Anxiety and depression - Continue bupropion  and BuSpar   # Morbid obesity Body mass index is 60.81 kg/m. Filed Weights   03/24/24 0500 03/25/24 0437  Weight: (!) 163.8 kg (!) 160.7 kg   - Check weight - F/u with PCP for weight lost and nutrition counseling   DVT prophylaxis: Eliquis     Code Status: Limited: Do not attempt resuscitation (DNR) -DNR-LIMITED -Do Not Intubate/DNI   Consults called: None  Family Communication: Discussed admission with grandson over the phone  Level of care: Progressive    Semya Klinke, Landon BRAVO, MD 03/25/2024, 12:51 PM [facility] Pager: [phone] Isaiah 41:10   If 7PM-7AM, please contact night-coverage [email or web address] Password [email or web address]

## 2024-03-25 NOTE — TOC Initial Note (Signed)
 Transition of Care [facility]) - Initial/Assessment Note    Patient Details  Name: Glenda Boone MRN: [id number] Date of Birth: 1951/05/17  [facility] CM/SW Contact:    Isaiah Public, LCSWA Phone Number: 03/25/2024, 3:02 PM  Clinical Narrative:                  CSW spoke with patient at bedside. Patient reports PTA she comes from [facility] LTC with hospice services through [facility] at facility. Patient confirms plan is to return back to [facility] when medically ready. Logan with [facility] confirmed patient can return when medically ready for dc. Logan confirmed that patient has Cpap at facility. CSW will continue to follow and assist with patients dc planning needs.   Expected Discharge Plan: Skilled Nursing Facility Barriers to Discharge: Continued Medical Work up   Patient Goals and CMS Choice Patient states their goals for this hospitalization and ongoing recovery are:: to return to SNF   Choice offered to / list presented to : Patient      Expected Discharge Plan and Services In-house Referral: Clinical Social Work     Living arrangements for the past 2 months: Skilled Nursing Facility                                      Prior Living Arrangements/Services Living arrangements for the past 2 months: Skilled Nursing Facility Lives with:: Facility Resident Patient language and need for interpreter reviewed:: Yes Do you feel safe going back to the place where you live?: Yes      Need for Family Participation in Patient Care: Yes (Comment) Care giver support system in place?: Yes (comment)   Criminal Activity/Legal Involvement Pertinent to Current Situation/Hospitalization: No - Comment as needed  Activities of Daily Living   ADL Screening (condition at time of admission) Independently performs ADLs?: No Does the patient have a NEW difficulty with bathing/dressing/toileting/self-feeding that is expected to last >3 days?: No Does the patient  have a NEW difficulty with getting in/out of bed, walking, or climbing stairs that is expected to last >3 days?: No Does the patient have a NEW difficulty with communication that is expected to last >3 days?: No Is the patient deaf or have difficulty hearing?: No Does the patient have difficulty seeing, even when wearing glasses/contacts?: No Does the patient have difficulty concentrating, remembering, or making decisions?: Yes  Permission Sought/Granted Permission sought to share information with : Case Manager, Magazine Features Editor, Family Supports Permission granted to share information with : Yes, Verbal Permission Granted  Share Information with NAME: Delon Sieving  Permission granted to share info w AGENCY: SNF  Permission granted to share info w Relationship: sister, Darden  Permission granted to share info w Contact Information: Delon Fila (sister) [phone], Ninoshka Wainwright (grandson) [phone]  Emotional Assessment   Attitude/Demeanor/Rapport: Gracious Affect (typically observed): Calm Orientation: : Oriented to Self, Oriented to Place, Oriented to  Time, Oriented to Situation Alcohol  / Substance Use: Not Applicable Psych Involvement: No (comment)  Admission diagnosis:  COPD exacerbation (HCC) [J44.1] Acute respiratory failure with hypoxia and hypercarbia (HCC) [J96.01, J96.02] Acute on chronic respiratory failure with hypoxia and hypercapnia (HCC) [G03.78, J96.22] Patient Active Problem List   Diagnosis Date Noted   Acute cystitis without hematuria 03/23/2024   Paroxysmal atrial fibrillation (HCC) 03/23/2024   Hypercarbia 03/23/2024   Acute on chronic respiratory failure with hypoxia and hypercapnia (HCC) 03/22/2024  Acute on chronic respiratory failure with hypoxia (HCC) 01/12/2024   Diarrhea 08/13/2023   Pressure injury of skin 08/12/2023   Chronic atrial fibrillation (HCC) 05/30/2022   Closed fracture of left ankle 05/24/2022   COPD  exacerbation (HCC) 03/28/2022   Acute on chronic diastolic CHF (congestive heart failure) (HCC) 03/28/2022   Acquired thrombophilia 02/12/2020   Current use of long term anticoagulation 02/12/2020   Current every day smoker 02/12/2020   COPD (chronic obstructive pulmonary disease) (HCC) 01/31/2020   Essential hypertension 01/31/2020   Obesity, Class III, BMI 40-49.9 (morbid obesity) (HCC) 01/31/2020   Type 2 diabetes mellitus with hyperlipidemia (HCC)    Hypothyroidism    History of TIA (transient ischemic attack) 09/04/2019   Aphagia 03/09/2019   Chronic bronchitis (HCC) 06/21/2015   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HCC) 11/28/2014   H/O diabetes mellitus 11/28/2014   Anxiety, generalized 11/28/2014   H/O hypercholesterolemia 11/28/2014   H/O: hypothyroidism 11/28/2014   Depression, major, recurrent, moderate (HCC) 11/28/2014   H/O disease 11/28/2014   Neuropathy 11/28/2014   PCP:  Feliciano Devoria LABOR, MD Pharmacy:   [facility] - [place], [place] - [address] [place] [address] Phone: [phone] Fax: [phone]     Social Drivers of Health (SDOH) Social History: SDOH Screenings   Food Insecurity: Patient Unable To Answer (03/23/2024)  Housing: Low Risk  (03/23/2024)  Transportation Needs: Patient Unable To Answer (03/23/2024)  Utilities: Patient Unable To Answer (03/23/2024)  Social Connections: Unknown (03/23/2024)  Tobacco Use: High Risk (03/22/2024)   SDOH Interventions:     Readmission Risk Interventions    08/14/2023   12:38 PM 08/12/2023    2:14 PM  Readmission Risk Prevention Plan  Transportation Screening Complete Complete  PCP or Specialist Appt within 5-7 Days Complete Complete  Home Care Screening Complete Complete  Medication Review (RN CM) Complete Complete

## 2024-03-25 NOTE — Plan of Care (Signed)

## 2024-03-25 NOTE — Progress Notes (Signed)
 Pt requested for bipap to be taken off at this time, bipap placed on stand by . Nasal cannula applied , sats in the mid 90s pt has no other concerns at this time.

## 2024-03-25 NOTE — Plan of Care (Signed)
   Problem: Education: Goal: Ability to describe self-care measures that may prevent or decrease complications (Diabetes Survival Skills Education) will improve Outcome: Progressing Goal: Individualized Educational Video(s) Outcome: Progressing   Problem: Coping: Goal: Ability to adjust to condition or change in health will improve Outcome: Progressing

## 2024-03-25 NOTE — Care Management Important Message (Signed)
 Important Message  Patient Details  Name: CLEMMIE BUELNA MRN: [id number] Date of Birth: August 29, 1950   Important Message Given:  Yes - Medicare IM     Vonzell Arrie Sharps 03/25/2024, 1:12 PM

## 2024-03-25 NOTE — Progress Notes (Signed)
 PT Cancellation Note  Patient Details Name: Glenda Boone MRN: [id number] DOB: 19-Oct-1950   Cancelled Treatment:    Reason Eval/Treat Not Completed: Patient declined, no reason specified. Pt eating dinner, declines PT evaluation at this time. PT will follow up as time allows.   Bernardino [name] 03/25/2024, 5:27 PM

## 2024-03-26 DIAGNOSIS — J441 Chronic obstructive pulmonary disease with (acute) exacerbation: Secondary | ICD-10-CM

## 2024-03-26 DIAGNOSIS — N1831 Chronic kidney disease, stage 3a: Secondary | ICD-10-CM

## 2024-03-26 DIAGNOSIS — F32A Depression, unspecified: Secondary | ICD-10-CM

## 2024-03-26 DIAGNOSIS — E039 Hypothyroidism, unspecified: Secondary | ICD-10-CM

## 2024-03-26 DIAGNOSIS — I5033 Acute on chronic diastolic (congestive) heart failure: Secondary | ICD-10-CM | POA: Diagnosis not present

## 2024-03-26 DIAGNOSIS — E785 Hyperlipidemia, unspecified: Secondary | ICD-10-CM

## 2024-03-26 DIAGNOSIS — I10 Essential (primary) hypertension: Secondary | ICD-10-CM | POA: Diagnosis not present

## 2024-03-26 DIAGNOSIS — I480 Paroxysmal atrial fibrillation: Secondary | ICD-10-CM

## 2024-03-26 DIAGNOSIS — Z8673 Personal history of transient ischemic attack (TIA), and cerebral infarction without residual deficits: Secondary | ICD-10-CM

## 2024-03-26 DIAGNOSIS — N3000 Acute cystitis without hematuria: Secondary | ICD-10-CM

## 2024-03-26 DIAGNOSIS — N1832 Chronic kidney disease, stage 3b: Secondary | ICD-10-CM | POA: Insufficient documentation

## 2024-03-26 DIAGNOSIS — E1169 Type 2 diabetes mellitus with other specified complication: Secondary | ICD-10-CM

## 2024-03-26 DIAGNOSIS — E66813 Obesity, class 3: Secondary | ICD-10-CM

## 2024-03-26 LAB — CBC WITH DIFFERENTIAL/PLATELET
Abs Immature Granulocytes: 0.1 K/uL — ABNORMAL HIGH (ref 0.00–0.07)
Basophils Absolute: 0 K/uL (ref 0.0–0.1)
Basophils Relative: 0 %
Eosinophils Absolute: 0.1 K/uL (ref 0.0–0.5)
Eosinophils Relative: 1 %
HCT: 31 % — ABNORMAL LOW (ref 36.0–46.0)
Hemoglobin: 9.4 g/dL — ABNORMAL LOW (ref 12.0–15.0)
Immature Granulocytes: 1 %
Lymphocytes Relative: 21 %
Lymphs Abs: 1.6 K/uL (ref 0.7–4.0)
MCH: 30.5 pg (ref 26.0–34.0)
MCHC: 30.3 g/dL (ref 30.0–36.0)
MCV: 100.6 fL — ABNORMAL HIGH (ref 80.0–100.0)
Monocytes Absolute: 0.7 K/uL (ref 0.1–1.0)
Monocytes Relative: 9 %
Neutro Abs: 5.2 K/uL (ref 1.7–7.7)
Neutrophils Relative %: 68 %
Platelets: 265 K/uL (ref 150–400)
RBC: 3.08 MIL/uL — ABNORMAL LOW (ref 3.87–5.11)
RDW: 16.5 % — ABNORMAL HIGH (ref 11.5–15.5)
WBC: 7.5 K/uL (ref 4.0–10.5)
nRBC: 0 % (ref 0.0–0.2)

## 2024-03-26 LAB — GLUCOSE, CAPILLARY
Glucose-Capillary: 115 mg/dL — ABNORMAL HIGH (ref 70–99)
Glucose-Capillary: 165 mg/dL — ABNORMAL HIGH (ref 70–99)
Glucose-Capillary: 212 mg/dL — ABNORMAL HIGH (ref 70–99)
Glucose-Capillary: 233 mg/dL — ABNORMAL HIGH (ref 70–99)
Glucose-Capillary: 128 mg/dL — ABNORMAL HIGH (ref 70–99)

## 2024-03-26 LAB — COMPREHENSIVE METABOLIC PANEL WITH GFR
ALT: 23 U/L (ref 0–44)
AST: 33 U/L (ref 15–41)
Albumin: 2.8 g/dL — ABNORMAL LOW (ref 3.5–5.0)
Alkaline Phosphatase: 53 U/L (ref 38–126)
Anion gap: 14 (ref 5–15)
BUN: 35 mg/dL — ABNORMAL HIGH (ref 8–23)
CO2: 41 mmol/L — ABNORMAL HIGH (ref 22–32)
Calcium: 8.6 mg/dL — ABNORMAL LOW (ref 8.9–10.3)
Chloride: 85 mmol/L — ABNORMAL LOW (ref 98–111)
Creatinine, Ser: 1.23 mg/dL — ABNORMAL HIGH (ref 0.44–1.00)
GFR, Estimated: 46 mL/min — ABNORMAL LOW (ref >60)
Glucose, Bld: 126 mg/dL — ABNORMAL HIGH (ref 70–99)
Potassium: 3.6 mmol/L (ref 3.5–5.1)
Sodium: 140 mmol/L (ref 135–145)
Total Bilirubin: 0.5 mg/dL (ref 0.0–1.2)
Total Protein: 6.2 g/dL — ABNORMAL LOW (ref 6.5–8.1)

## 2024-03-26 MED ORDER — POTASSIUM CHLORIDE CRYS ER 20 MEQ PO TBCR
40.0000 meq | EXTENDED_RELEASE_TABLET | Freq: Once | ORAL | Status: AC
  Administered 2024-03-26: 40 meq via ORAL
  Filled 2024-03-26: qty 2

## 2024-03-26 MED ORDER — ATORVASTATIN CALCIUM 10 MG PO TABS
20.0000 mg | ORAL_TABLET | Freq: Every day | ORAL | Status: DC
  Administered 2024-03-26 – 2024-03-27 (×2): 20 mg via ORAL
  Filled 2024-03-26 (×2): qty 2

## 2024-03-26 MED ORDER — FUROSEMIDE 40 MG PO TABS
40.0000 mg | ORAL_TABLET | Freq: Two times a day (BID) | ORAL | Status: DC
  Administered 2024-03-27 – 2024-03-28 (×3): 40 mg via ORAL
  Filled 2024-03-26 (×3): qty 1

## 2024-03-26 MED ORDER — PREDNISONE 20 MG PO TABS
20.0000 mg | ORAL_TABLET | Freq: Every day | ORAL | Status: DC
  Administered 2024-03-27 – 2024-03-28 (×2): 20 mg via ORAL
  Filled 2024-03-26 (×2): qty 1

## 2024-03-26 NOTE — Assessment & Plan Note (Signed)
 Patient had 3 days of IV ceftriaxone , with good toleration.  Hold on SGLT 2 inh

## 2024-03-26 NOTE — Assessment & Plan Note (Signed)
 Continue with levothyroxine

## 2024-03-26 NOTE — Assessment & Plan Note (Signed)
 Continue blood pressure control with metoprolol  Continue blood pressure monitoring

## 2024-03-26 NOTE — Assessment & Plan Note (Addendum)
 Patient was placed on insulin  sliding scale for glucose cover and monitoring, her glucose remained stable.   Continue with linagliptin . Glucose controlled with fasting glucose today at 108 mg/dl   Resume statin therapy

## 2024-03-26 NOTE — Assessment & Plan Note (Addendum)
 Renal function today with serum cr at 1,23 with K at 3,6 and serum bicarbonate at 41 Na 140   Add 40 meq Kcl today to prevent hypokalemia.  Plan to transition to po loop diuretic and follow up renal function and electrolytes in am.  Avoid hypotension or nephrotoxic medications

## 2024-03-26 NOTE — Assessment & Plan Note (Addendum)
 COPD exacerbation  Acute on chronic hypoxemic and hypercapnic respiratory failure  02 saturation is 96% on 5 L/min today Her dyspnea has been improving Continue to have poor mobility.   Patient had systemic corticosteroids during her hospitalization.  Plan to continue budesonide , umeclidin vilanterol  As needed albuterol .  Continue to work with PT and OT Continue home NIV

## 2024-03-26 NOTE — Assessment & Plan Note (Signed)
 Continue blood pressure control, resume statin and continue anticoagulation with apixaban 

## 2024-03-26 NOTE — Progress Notes (Signed)
 PT Cancellation Note  Patient Details Name: Glenda Boone MRN: [id number] DOB: [birth year]   Cancelled Treatment:    Reason Eval/Treat Not Completed: Patient declined, no reason specified. Pt is LTC resident at facility and is w/c bound per prior encounter notes. Will recheck on Monday. Pt can return to facility as LTC resident without PT eval/intervention.   Rodgers ORN [facility] 03/26/2024, 3:59 PM Rodgers Opal PT Acute [employer] [phone]

## 2024-03-26 NOTE — Assessment & Plan Note (Signed)
 Continue with bupropion  and buspirone 

## 2024-03-26 NOTE — Progress Notes (Addendum)
 Progress Note   Patient: SUSANNAH CARBIN FMW:[id number] DOB: 04-06-1951 DOA: 03/22/2024     4 DOS: the patient was seen and examined on 03/26/2024   Brief hospital course: Mrs. Rigdon was admitted to the hospital with the working diagnosis of COPD exacerbation.   73 yo female with the past medical history of hypertension, dyslipidemia, T2DM, heart failure, atrial fibrillation, COPD and obesity who presented with dyspnea.  Apparently patient developed acute onset of rapidly worsening dyspnea at the SNF. EMS was called and she was found in respiratory distress. Her 02 saturation was in the 80's on 6 L/min per .  She was given bronchodilator therapy, steroids and was transported to the ED, where she was placed on Bipap.  On her initial physical examination her blood pressure systolic was 130's, HR 40 to 60, RR 25 and 02 saturation 96% on Bipap.  Her lungs had decreased breath sounds bilaterally, heart with S1 and S2 present, with no gallops or rubs, abdomen with no distention, soft and non tender, positive lower extremity edema ++.   Chest radiograph with hypoinflation, positive cardiomegaly, with no effusions or infiltrates.   EKG 63 bpm, right axis deviation, normal intervals, qtc 479, sinus rhythm with no significant ST segment or T wave changes.    Assessment and Plan: * COPD (chronic obstructive pulmonary disease) (HCC) COPD exacerbation  Acute on chronic hypoxemic and hypercapnic respiratory failure  02 saturation is 90% on 2.5 L/min today Her dyspnea has been improving Continue to have poor mobility.   Plan to continue budesonide , umeclidin vilanterol  As needed albuterol .  Change methylprednisolone  to prednisone  20 mg po daily for now as taper.  Continue to work with PT and OT Possible transfer to SNF In 48 hrs   Acute on chronic diastolic CHF (congestive heart failure) (HCC) Echocardiogram with preserved LV systolic function with EF 70 to 75%, moderate LVH, grade II  diastolic dysfunction with pseudo normalization, EA, moderate left atrial dilatation, no significant valvular disease.   Urine output is 2,650 ml, since admission is -8,060 ml, negative  Systolic blood pressure 130 to 140 mmHg.   Plan to transition to oral loop diuretic Continue metoprolol  and add low dose mineralocorticoid receptor blocker for RAAS inhibition.  Poor mobility and urine infection, not candidate at this point for SGLT 2 inh   Paroxysmal atrial fibrillation (HCC) Continue rate control with metoprolol  succinate.  Continue anticoagulation with apixaban .   Essential hypertension Continue blood pressure control with metoprolol  Continue blood pressure monitoring   Chronic kidney disease, stage 3a (HCC) Renal function today with serum cr at 1,23 with K at 3,6 and serum bicarbonate at 41 Na 140   Add 40 meq Kcl today to prevent hypokalemia.  Plan to transition to po loop diuretic and follow up renal function and electrolytes in am.  Avoid hypotension or nephrotoxic medications   Type 2 diabetes mellitus with hyperlipidemia (HCC) Continue glucose cover and monitoring with insulin  sliding scale.  Continue with linagliptin . Glucose controlled with fasting glucose today at 126 mg/dl   Resume statin therapy    History of TIA (transient ischemic attack) Continue blood pressure control, resume statin and continue anticoagulation with apixaban   Acute cystitis without hematuria Patient had 3 days of IV ceftriaxone , will hold on further antibiotic therapy for now.   Hypothyroidism Continue with levothyroxine    Depression Continue with bupropion  and buspirone    Class 3 obesity (HCC) Calculated BMI is 59.8         Subjective: Patient with  improvement in dyspnea, not yet back to baseline, poor mobility and bilateral lower extremity neuropathy   Physical Exam: Vitals:   03/26/24 0024 03/26/24 0540 03/26/24 0746 03/26/24 0747  BP: (!) 152/80 (!) 151/80  137/62   Pulse: (!) 54 60  (!) 59  Resp: 20 18 17 18   Temp: 97.9 F (36.6 C) 98.7 F (37.1 C) 97.8 F (36.6 C)   TempSrc: Oral Oral Oral   SpO2: 92% 94%  90%  Weight:  (!) 158.2 kg     Neurology awake and alert ENT with mild pallor Cardiovascular with S1 and S2 present and regular with no gallops or rubs, no murmurs Respiratory with prolonged expiratory phase and scattered rales with no wheezing or rhonchi  Abdomen with no distention, soft and non tender, protuberant Lower extremity with no ankle edema, positive bilateral lymphedema   Data Reviewed:    Family Communication: no family at the bedside   Disposition: Status is: Inpatient Remains inpatient appropriate because: recovering COPD   Planned Discharge Destination: Skilled nursing facility   Author: Elidia Toribio Furnace, MD 03/26/2024 9:24 AM  For on call review [email or web address].

## 2024-03-26 NOTE — Hospital Course (Addendum)
 Glenda Boone was admitted to the hospital with the working diagnosis of COPD exacerbation.   73 yo female with the past medical history of hypertension, dyslipidemia, T2DM, heart failure, atrial fibrillation, COPD and obesity who presented with dyspnea.  Apparently patient developed acute onset of rapidly worsening dyspnea at the SNF. EMS was called and she was found in respiratory distress. Her 02 saturation was in the 80's on 6 L/min per [place].  She was given bronchodilator therapy, steroids and was transported to the ED, where she was placed on Bipap.  On her initial physical examination her blood pressure systolic was 130's, HR 40 to 60, RR 25 and 02 saturation 96% on Bipap.  Her lungs had decreased breath sounds bilaterally, heart with S1 and S2 present, with no gallops or rubs, abdomen with no distention, soft and non tender, positive lower extremity edema ++.   Chest radiograph with hypoinflation, positive cardiomegaly, with no effusions or infiltrates.   EKG 63 bpm, right axis deviation, normal intervals, qtc 479, sinus rhythm with no significant ST segment or T wave changes.

## 2024-03-26 NOTE — Assessment & Plan Note (Signed)
 Echocardiogram with preserved LV systolic function with EF 70 to 75%, moderate LVH, grade II diastolic dysfunction with pseudo normalization, EA, moderate left atrial dilatation, no significant valvular disease.   Urine output is 2,650 ml, since admission is -8,060 ml, negative  Systolic blood pressure 130 to 140 mmHg.   Plan to transition to oral loop diuretic Continue metoprolol  and add low dose mineralocorticoid receptor blocker for RAAS inhibition.  Poor mobility and urine infection, not candidate at this point for SGLT 2 inh

## 2024-03-26 NOTE — Assessment & Plan Note (Signed)
 Continue rate control with metoprolol  succinate.  Continue anticoagulation with apixaban .

## 2024-03-26 NOTE — Assessment & Plan Note (Signed)
 Calculated BMI is 59.8

## 2024-03-27 LAB — GLUCOSE, CAPILLARY
Glucose-Capillary: 141 mg/dL — ABNORMAL HIGH (ref 70–99)
Glucose-Capillary: 143 mg/dL — ABNORMAL HIGH (ref 70–99)
Glucose-Capillary: 167 mg/dL — ABNORMAL HIGH (ref 70–99)
Glucose-Capillary: 168 mg/dL — ABNORMAL HIGH (ref 70–99)

## 2024-03-27 LAB — BASIC METABOLIC PANEL WITH GFR
Anion gap: 12 (ref 5–15)
BUN: 36 mg/dL — ABNORMAL HIGH (ref 8–23)
CO2: 40 mmol/L — ABNORMAL HIGH (ref 22–32)
Calcium: 8.6 mg/dL — ABNORMAL LOW (ref 8.9–10.3)
Chloride: 87 mmol/L — ABNORMAL LOW (ref 98–111)
Creatinine, Ser: 1.34 mg/dL — ABNORMAL HIGH (ref 0.44–1.00)
GFR, Estimated: 42 mL/min — ABNORMAL LOW (ref >60)
Glucose, Bld: 108 mg/dL — ABNORMAL HIGH (ref 70–99)
Potassium: 4.1 mmol/L (ref 3.5–5.1)
Sodium: 139 mmol/L (ref 135–145)

## 2024-03-27 LAB — CULTURE, BLOOD (ROUTINE X 2)
Culture: NO GROWTH
Special Requests: ADEQUATE

## 2024-03-27 MED ORDER — SENNOSIDES-DOCUSATE SODIUM 8.6-50 MG PO TABS
2.0000 | ORAL_TABLET | Freq: Every day | ORAL | Status: DC
  Administered 2024-03-27: 2 via ORAL
  Filled 2024-03-27: qty 2

## 2024-03-27 MED ORDER — POLYETHYLENE GLYCOL 3350 17 G PO PACK
17.0000 g | PACK | Freq: Every day | ORAL | Status: DC
Start: 2024-03-27 — End: 2024-03-28

## 2024-03-27 NOTE — Progress Notes (Signed)
 Patient is not ready to be placed on Bipap.

## 2024-03-27 NOTE — Plan of Care (Signed)

## 2024-03-27 NOTE — TOC Progression Note (Signed)
 Transition of Care [facility]) - Initial/Assessment Note    Patient Details  Name: Glenda Boone MRN: [id number] Date of Birth: 1950/11/09  [facility]) CM/SW Contact:    Britt JULIANNA Bennetts, LCSW Phone Number: 03/27/2024, 8:53 AM  Clinical Narrative:              CSW notified that pt will be medically ready to d/c back to SNF tomorrow and contacted Leontine at [facility] ([facility]) to provide update.  Per Leontine, the facility is prepared to accept the pt back tomorrow.  TOC will continue to follow.    Expected Discharge Plan: Skilled Nursing Facility Barriers to Discharge: Continued Medical Work up   Patient Goals and CMS Choice Patient states their goals for this hospitalization and ongoing recovery are:: to return to SNF   Choice offered to / list presented to : Patient      Expected Discharge Plan and Services In-house Referral: Clinical Social Work     Living arrangements for the past 2 months: Skilled Nursing Facility                                      Prior Living Arrangements/Services Living arrangements for the past 2 months: Skilled Nursing Facility Lives with:: Facility Resident Patient language and need for interpreter reviewed:: Yes Do you feel safe going back to the place where you live?: Yes      Need for Family Participation in Patient Care: Yes (Comment) Care giver support system in place?: Yes (comment)   Criminal Activity/Legal Involvement Pertinent to Current Situation/Hospitalization: No - Comment as needed  Activities of Daily Living   ADL Screening (condition at time of admission) Independently performs ADLs?: No Does the patient have a NEW difficulty with bathing/dressing/toileting/self-feeding that is expected to last >3 days?: No Does the patient have a NEW difficulty with getting in/out of bed, walking, or climbing stairs that is expected to last >3 days?: No Does the patient have a NEW difficulty with communication that is expected  to last >3 days?: No Is the patient deaf or have difficulty hearing?: No Does the patient have difficulty seeing, even when wearing glasses/contacts?: No Does the patient have difficulty concentrating, remembering, or making decisions?: Yes  Permission Sought/Granted Permission sought to share information with : Case Manager, Magazine Features Editor, Family Supports Permission granted to share information with : Yes, Verbal Permission Granted  Share Information with NAME: Delon Sieving  Permission granted to share info w AGENCY: SNF  Permission granted to share info w Relationship: sister, Darden  Permission granted to share info w Contact Information: Delon Fila (sister) [phone], Darrin Apodaca (grandson) [phone]  Emotional Assessment   Attitude/Demeanor/Rapport: Gracious Affect (typically observed): Calm Orientation: : Oriented to Self, Oriented to Place, Oriented to  Time, Oriented to Situation Alcohol  / Substance Use: Not Applicable Psych Involvement: No (comment)  Admission diagnosis:  COPD exacerbation (HCC) [J44.1] Acute respiratory failure with hypoxia and hypercarbia (HCC) [J96.01, J96.02] Acute on chronic respiratory failure with hypoxia and hypercapnia (HCC) [G03.78, J96.22] Patient Active Problem List   Diagnosis Date Noted   Chronic kidney disease, stage 3a (HCC) 03/26/2024   Depression 03/26/2024   Acute cystitis without hematuria 03/23/2024   Paroxysmal atrial fibrillation (HCC) 03/23/2024   Hypercarbia 03/23/2024   Acute on chronic respiratory failure with hypoxia and hypercapnia (HCC) 03/22/2024   Acute on chronic respiratory failure with hypoxia (HCC) 01/12/2024  Diarrhea 08/13/2023   Pressure injury of skin 08/12/2023   Chronic atrial fibrillation (HCC) 05/30/2022   Closed fracture of left ankle 05/24/2022   COPD exacerbation (HCC) 03/28/2022   Acute on chronic diastolic CHF (congestive heart failure) (HCC) 03/28/2022   Acquired  thrombophilia 02/12/2020   Current use of long term anticoagulation 02/12/2020   Current every day smoker 02/12/2020   COPD (chronic obstructive pulmonary disease) (HCC) 01/31/2020   Essential hypertension 01/31/2020   Class 3 obesity (HCC) 01/31/2020   Type 2 diabetes mellitus with hyperlipidemia (HCC)    Hypothyroidism    History of TIA (transient ischemic attack) 09/04/2019   Aphagia 03/09/2019   Chronic bronchitis (HCC) 06/21/2015   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HCC) 11/28/2014   H/O diabetes mellitus 11/28/2014   Anxiety, generalized 11/28/2014   H/O hypercholesterolemia 11/28/2014   H/O: hypothyroidism 11/28/2014   Depression, major, recurrent, moderate (HCC) 11/28/2014   H/O disease 11/28/2014   Neuropathy 11/28/2014   PCP:  Feliciano Devoria LABOR, MD Pharmacy:   [facility] - [place], [place] - [address] [place] [address] Phone: [phone] Fax: [phone]     Social Drivers of Health (SDOH) Social History: SDOH Screenings   Food Insecurity: Patient Unable To Answer (03/23/2024)  Housing: Low Risk  (03/23/2024)  Transportation Needs: Patient Unable To Answer (03/23/2024)  Utilities: Patient Unable To Answer (03/23/2024)  Social Connections: Unknown (03/23/2024)  Tobacco Use: High Risk (03/22/2024)   SDOH Interventions:     Readmission Risk Interventions    08/14/2023   12:38 PM 08/12/2023    2:14 PM  Readmission Risk Prevention Plan  Transportation Screening Complete Complete  PCP or Specialist Appt within 5-7 Days Complete Complete  Home Care Screening Complete Complete  Medication Review (RN CM) Complete Complete

## 2024-03-28 DIAGNOSIS — I10 Essential (primary) hypertension: Secondary | ICD-10-CM | POA: Diagnosis not present

## 2024-03-28 DIAGNOSIS — I480 Paroxysmal atrial fibrillation: Secondary | ICD-10-CM | POA: Diagnosis not present

## 2024-03-28 DIAGNOSIS — F32A Depression, unspecified: Secondary | ICD-10-CM

## 2024-03-28 DIAGNOSIS — I5033 Acute on chronic diastolic (congestive) heart failure: Secondary | ICD-10-CM | POA: Diagnosis not present

## 2024-03-28 DIAGNOSIS — J441 Chronic obstructive pulmonary disease with (acute) exacerbation: Secondary | ICD-10-CM | POA: Diagnosis not present

## 2024-03-28 DIAGNOSIS — N1832 Chronic kidney disease, stage 3b: Secondary | ICD-10-CM

## 2024-03-28 LAB — GLUCOSE, CAPILLARY
Glucose-Capillary: 134 mg/dL — ABNORMAL HIGH (ref 70–99)
Glucose-Capillary: 129 mg/dL — ABNORMAL HIGH (ref 70–99)
Glucose-Capillary: 231 mg/dL — ABNORMAL HIGH (ref 70–99)

## 2024-03-28 MED ORDER — SPIRONOLACTONE 12.5 MG HALF TABLET
12.5000 mg | ORAL_TABLET | Freq: Every day | ORAL | Status: DC
  Administered 2024-03-28: 12.5 mg via ORAL
  Filled 2024-03-28: qty 1

## 2024-03-28 MED ORDER — FUROSEMIDE 40 MG PO TABS: 40.0000 mg | ORAL_TABLET | Freq: Two times a day (BID) | ORAL | 0 refills | Status: AC

## 2024-03-28 MED ORDER — SPIRONOLACTONE 25 MG PO TABS
12.5000 mg | ORAL_TABLET | Freq: Every day | ORAL | 0 refills | Status: AC
Start: 2024-03-28 — End: ?

## 2024-03-28 MED ORDER — POLYVINYL ALCOHOL 1.4 % OP SOLN: 1.0000 [drp] | OPHTHALMIC | Status: DC | PRN

## 2024-03-28 NOTE — TOC Progression Note (Signed)
 Transition of Care [facility]) - Progression Note    Patient Details  Name: Glenda Boone MRN: [id number] Date of Birth: 11/11/1950  [facility]) CM/SW Contact  Isaiah Public, LCSWA Phone Number: 03/28/2024, 11:20 AM  Clinical Narrative:     Leontine with [facility] confirmed that facility has a bipap and lift pad for patient at the facility. [facility] confirmed facility can accept patient today if medically stable. CSW informed MD.  Expected Discharge Plan: Skilled Nursing Facility Barriers to Discharge: Continued Medical Work up               Expected Discharge Plan and Services In-house Referral: Clinical Social Work     Living arrangements for the past 2 months: Skilled Nursing Facility                                       Social Drivers of Health (SDOH) Interventions SDOH Screenings   Food Insecurity: Patient Unable To Answer (03/23/2024)  Housing: Low Risk  (03/23/2024)  Transportation Needs: Patient Unable To Answer (03/23/2024)  Utilities: Patient Unable To Answer (03/23/2024)  Social Connections: Unknown (03/23/2024)  Tobacco Use: High Risk (03/22/2024)    Readmission Risk Interventions    08/14/2023   12:38 PM 08/12/2023    2:14 PM  Readmission Risk Prevention Plan  Transportation Screening Complete Complete  PCP or Specialist Appt within 5-7 Days Complete Complete  Home Care Screening Complete Complete  Medication Review (RN CM) Complete Complete

## 2024-03-28 NOTE — Care Management Important Message (Signed)
 Important Message  Patient Details  Name: Glenda Boone MRN: [id number] Date of Birth: [birth year]   Important Message Given:  Yes - Medicare IM     Vonzell Arrie Sharps 03/28/2024, 11:48 AM

## 2024-03-28 NOTE — NC FL2 (Signed)
 [place]  MEDICAID FL2 LEVEL OF CARE FORM     IDENTIFICATION  Patient Name: Glenda Boone Birthdate: 1950-09-22 Sex: female Admission Date (Current Location): 03/22/2024  [facility] and [place] Number:  Producer, Television/film/video and Address:  [facility] [place], [address], [place], [place] [address]      Provider Number: [id number]  Attending Physician Name and Address:  Noralee Elidia Sieving,*  Relative Name and Phone Number:  Delon Novel (sister) [phone], Baelynn Schmuhl [facility]) [phone]    Current Level of Care: Hospital Recommended Level of Care: Skilled Nursing Facility Prior Approval Number:    Date Approved/Denied:   PASRR Number: [id number]  Discharge Plan: SNF    Current Diagnoses: Patient Active Problem List   Diagnosis Date Noted   Chronic kidney disease, stage 3a (HCC) 03/26/2024   Depression 03/26/2024   Acute cystitis without hematuria 03/23/2024   Paroxysmal atrial fibrillation (HCC) 03/23/2024   Hypercarbia 03/23/2024   Acute on chronic respiratory failure with hypoxia and hypercapnia (HCC) 03/22/2024   Acute on chronic respiratory failure with hypoxia (HCC) 01/12/2024   Diarrhea 08/13/2023   Pressure injury of skin 08/12/2023   Chronic atrial fibrillation (HCC) 05/30/2022   Closed fracture of left ankle 05/24/2022   COPD exacerbation (HCC) 03/28/2022   Acute on chronic diastolic CHF (congestive heart failure) (HCC) 03/28/2022   Acquired thrombophilia 02/12/2020   Current use of long term anticoagulation 02/12/2020   Current every day smoker 02/12/2020   COPD (chronic obstructive pulmonary disease) (HCC) 01/31/2020   Essential hypertension 01/31/2020   Class 3 obesity (HCC) 01/31/2020   Type 2 diabetes mellitus with hyperlipidemia (HCC)    Hypothyroidism    History of TIA (transient ischemic attack) 09/04/2019   Aphagia 03/09/2019   Chronic bronchitis (HCC) 06/21/2015   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HCC)  11/28/2014   H/O diabetes mellitus 11/28/2014   Anxiety, generalized 11/28/2014   H/O hypercholesterolemia 11/28/2014   H/O: hypothyroidism 11/28/2014   Depression, major, recurrent, moderate (HCC) 11/28/2014   H/O disease 11/28/2014   Neuropathy 11/28/2014    Orientation RESPIRATION BLADDER Height & Weight     Self, Time, Situation, Place  O2 (Nasal Cannula 5 liters) Incontinent, External catheter (External Urinary Catheter) Weight: (!) 345 lb 14.4 oz (156.9 kg) Height:  5' 4 (162.6 cm)  BEHAVIORAL SYMPTOMS/MOOD NEUROLOGICAL BOWEL NUTRITION STATUS      Incontinent Diet (Please see discharge summary)  AMBULATORY STATUS COMMUNICATION OF NEEDS Skin     Verbally Other (Comment) (Erythema,buttocks, Leg,Bil.,Wound/Incision LDAs)                       Personal Care Assistance Level of Assistance              Functional Limitations Info  Sight, Hearing, Speech Sight Info: Adequate Hearing Info: Adequate Speech Info: Adequate    SPECIAL CARE FACTORS FREQUENCY                       Contractures Contractures Info: Not present    Additional Factors Info  Code Status, Allergies, Psychotropic, Insulin  Sliding Scale Code Status Info: DNR Allergies Info: Shellfish Allergy,Codeine,Demerol (meperidine Hcl),Neurontin  (gabapentin ) Psychotropic Info: buPROPion  (WELLBUTRIN ) tablet 75 mg daily,busPIRone  (BUSPAR ) tablet 7.5 mg 2 times daily Insulin  Sliding Scale Info: insulin  aspart (novoLOG ) injection 0-15 Units 3 times daily with meals,insulin  aspart (novoLOG ) injection 0-5 Units daily at bedtime       Current Medications (03/28/2024):  This is the current  hospital active medication list Current Facility-Administered Medications  Medication Dose Route Frequency Provider Last Rate Last Admin   acetaminophen  (TYLENOL ) tablet 650 mg  650 mg Oral Q6H PRN Amponsah, Prosper M, MD   650 mg at 03/26/24 2240   Or   acetaminophen  (TYLENOL ) suppository 650 mg  650 mg Rectal Q6H PRN  Lou Claretta HERO, MD       apixaban  (ELIQUIS ) tablet 5 mg  5 mg Oral BID Amponsah, Prosper M, MD   5 mg at 03/28/24 9147   artificial tears ophthalmic solution 1 drop  1 drop Both Eyes PRN Arrien, Elidia Sieving, MD       atorvastatin  (LIPITOR) tablet 20 mg  20 mg Oral QHS Arrien, Mauricio Daniel, MD   20 mg at 03/27/24 2133   bisacodyl  (DULCOLAX) EC tablet 5 mg  5 mg Oral Daily PRN Amponsah, Prosper M, MD       budesonide  (PULMICORT ) nebulizer solution 0.25 mg  0.25 mg Nebulization BID Amponsah, Prosper M, MD   0.25 mg at 03/28/24 9161   buPROPion  (WELLBUTRIN ) tablet 75 mg  75 mg Oral Daily Amponsah, Prosper M, MD   75 mg at 03/28/24 9141   busPIRone  (BUSPAR ) tablet 7.5 mg  7.5 mg Oral BID Amponsah, Prosper M, MD   7.5 mg at 03/28/24 9147   cyclobenzaprine  (FLEXERIL ) tablet 5 mg  5 mg Oral Daily PRN Amponsah, Prosper M, MD   5 mg at 03/27/24 2330   furosemide  (LASIX ) tablet 40 mg  40 mg Oral BID Arrien, Mauricio Daniel, MD   40 mg at 03/28/24 9147   insulin  aspart (novoLOG ) injection 0-15 Units  0-15 Units Subcutaneous TID WC Amponsah, Prosper M, MD   2 Units at 03/28/24 9147   insulin  aspart (novoLOG ) injection 0-5 Units  0-5 Units Subcutaneous QHS Lou Claretta HERO, MD       levothyroxine  (SYNTHROID ) tablet 200 mcg  200 mcg Oral Q0600 Amponsah, Prosper M, MD   200 mcg at 03/28/24 9348   linagliptin  (TRADJENTA ) tablet 5 mg  5 mg Oral Daily Amponsah, Prosper M, MD   5 mg at 03/28/24 9147   metoprolol  succinate (TOPROL -XL) 24 hr tablet 25 mg  25 mg Oral Daily Amponsah, Prosper M, MD   25 mg at 03/28/24 9147   ondansetron  (ZOFRAN ) tablet 4 mg  4 mg Oral Q6H PRN Amponsah, Prosper M, MD       Or   ondansetron  (ZOFRAN ) injection 4 mg  4 mg Intravenous Q6H PRN Amponsah, Prosper M, MD       polyethylene glycol (MIRALAX  / GLYCOLAX ) packet 17 g  17 g Oral Daily Arrien, Mauricio Daniel, MD       predniSONE  (DELTASONE ) tablet 20 mg  20 mg Oral Q breakfast Arrien, Elidia Sieving, MD   20 mg at  03/28/24 9147   senna-docusate (Senokot-S) tablet 2 tablet  2 tablet Oral QHS Arrien, Mauricio Daniel, MD   2 tablet at 03/27/24 2133   umeclidinium-vilanterol (ANORO ELLIPTA ) 62.5-25 MCG/ACT 1 puff  1 puff Inhalation Daily Lou Claretta HERO, MD   1 puff at 03/28/24 9160     Discharge Medications: Please see discharge summary for a list of discharge medications.  Relevant Imaging Results:  Relevant Lab Results:   Additional Information [id number]  Isaiah Public, LCSWA

## 2024-03-28 NOTE — Evaluation (Signed)
 Physical Therapy Evaluation Patient Details Name: Glenda Boone MRN: [id number] DOB: 1950/12/18 Today's Date: 03/28/2024  History of Present Illness  73 y.o. female presents to [facility] hospital on 03/22/2024 with SOB and hypoxia, admitted for acute on chronic respiratory failure and UTI. PMH includes COPD, CHF, OSA, CKD III, afib, HTN, DMII, anxiety, depression.  Clinical Impression  Pt in bed upon arrival of PT, agreeable to evaluation at this time. Prior to admission the pt was long term resident at [facility], reports she had been lifted OOB to [facility] by staff until ~2 months ago when the facility lost her lift pad and has been in bed since then. Pt able to reposition from supine to EOB without assist (does use bed features) and was then educated on LE strengthening exercises to complete from seated position. Pt is at her baseline mobility and activity, no follow up PT indicated at this time, however, it would be in the patient's best interest to be able to get out of bed via lift equipment consistently at her facility. No further acute PT needs identified at this time. Thank you for the consult.     If plan is discharge home, recommend the following: Two people to help with walking and/or transfers;Two people to help with bathing/dressing/bathroom;Assistance with cooking/housework;Assistance with feeding;Assist for transportation;Help with stairs or ramp for entrance   Can travel by private vehicle        Equipment Recommendations None recommended by PT  Recommendations for Other Services       Functional Status Assessment Patient has had a recent decline in their functional status and demonstrates the ability to make significant improvements in function in a reasonable and predictable amount of time.     Precautions / Restrictions Precautions Precautions: Fall Recall of Precautions/Restrictions: Intact Restrictions Weight Bearing Restrictions Per Provider Order: No      Mobility   Bed Mobility Overal bed mobility: Needs Assistance Bed Mobility: Supine to Sit     Supine to sit: Modified independent (Device/Increase time), Used rails, HOB elevated     General bed mobility comments: no assist, pt able to manage with bed features and reports she does have these bed features at facility    Transfers                   General transfer comment: attempted lateral scoot but pt unable to generate hip clearance for lateral movement or tolerate wt through LE. lack of assist to stand and pt has not ambulated in months at facility    Ambulation/Gait               General Gait Details: pt not ambulatory for months     Balance Overall balance assessment: Needs assistance Sitting-balance support: No upper extremity supported Sitting balance-Leahy Scale: Good Sitting balance - Comments: with mild reach outside BOS                                     Pertinent Vitals/Pain Pain Assessment Pain Assessment: No/denies pain    Home Living Family/patient expects to be discharged to:: Skilled nursing facility                   Additional Comments: pt is resident of [facility], reports used to get OOB to [facility] but staff has lost her lift sling and therefore can no longer lift her out of bed    Prior  Function Prior Level of Function : Needs assist             Mobility Comments: lift for all transfers to [facility], pt can transfer to sitting EOB on her own ADLs Comments: assist from staff for all ADL     Extremity/Trunk Assessment   Upper Extremity Assessment Upper Extremity Assessment: Defer to OT evaluation    Lower Extremity Assessment Lower Extremity Assessment: Generalized weakness (limited by pain from neuropathy, pt grossly 4-/5 to MMT, would be unable to support bodyweight with LE strength.)    Cervical / Trunk Assessment Cervical / Trunk Assessment: Other exceptions Cervical / Trunk Exceptions: large body habitus   Communication   Communication Communication: No apparent difficulties    Cognition Arousal: Alert Behavior During Therapy: WFL for tasks assessed/performed   PT - Cognitive impairments: No apparent impairments                         Following commands: Intact       Cueing Cueing Techniques: Verbal cues     General Comments General comments (skin integrity, edema, etc.): SpO2 88-93% on RA    Exercises General Exercises - Lower Extremity Ankle Circles/Pumps: AROM, Both, 10 reps Long Arc Quad: AROM, Both, 10 reps, Seated Heel Slides: AROM, Both, 5 reps, Seated Hip Flexion/Marching: AROM, Both, 5 reps, Seated   Assessment/Plan    PT Assessment Patient does not need any further PT services  PT Problem List         PT Treatment Interventions      PT Goals (Current goals can be found in the Care Plan section)  Acute Rehab PT Goals Patient Stated Goal: to get to her WC more often PT Goal Formulation: All assessment and education complete, DC therapy Time For Goal Achievement: 04/04/24 Potential to Achieve Goals: Fair     AM-PAC PT 6 Clicks Mobility  Outcome Measure Help needed turning from your back to your side while in a flat bed without using bedrails?: None Help needed moving from lying on your back to sitting on the side of a flat bed without using bedrails?: None Help needed moving to and from a bed to a chair (including a wheelchair)?: Total Help needed standing up from a chair using your arms (e.g., wheelchair or bedside chair)?: Total Help needed to walk in hospital room?: Total Help needed climbing 3-5 steps with a railing? : Total 6 Click Score: 12    End of Session   Activity Tolerance: Patient tolerated treatment well Patient left: in bed;with call bell/phone within reach (sitting EOB) Nurse Communication: Mobility status;Need for lift equipment PT Visit Diagnosis: Muscle weakness (generalized) (M62.81)    Time: 9155-9088 PT Time  Calculation (min) (ACUTE ONLY): 27 min   Charges:   PT Evaluation $PT Eval Low Complexity: 1 Low PT Treatments $Therapeutic Exercise: 8-22 mins PT General Charges $$ ACUTE PT VISIT: 1 Visit         Izetta Call, PT, DPT   Acute Rehabilitation Department Office [phone] Secure Chat Communication Preferred   Izetta JULIANNA Call 03/28/2024, 12:07 PM

## 2024-03-28 NOTE — TOC Transition Note (Signed)
 Transition of Care [facility]) - Discharge Note   Patient Details  Name: Glenda Boone MRN: [id number] Date of Birth: 1950-07-04  Transition of Care [facility]) CM/SW Contact:  Isaiah Public, LCSWA Phone Number: 03/28/2024, 12:25 PM   Clinical Narrative:     Patient will DC to: [facility] SNF  Anticipated DC date: 03/28/2024  Family notified: Delon   Transport by: [facility]   ?  Per MD patient ready for DC to [facility] . RN, patient, patient's family, and facility notified of DC. Logan with [employer] informed CSW that patients Bipap and lift pad are at facility. Discharge Summary sent to facility. RN given number for report [phone] RM# [id number]. DC packet on chart. DNR signed by MD attached to patients DC packet.Ambulance transport requested for patient.  CSW signing off.   Final next level of care: Skilled Nursing Facility Barriers to Discharge: Continued Medical Work up   Patient Goals and CMS Choice Patient states their goals for this hospitalization and ongoing recovery are:: SNF   Choice offered to / list presented to : Patient      Discharge Placement              Patient chooses bed at: [facility] Patient to be transferred to facility by: [facility] Name of family member notified: Delon Patient and family notified of of transfer: 03/28/24  Discharge Plan and Services Additional resources added to the After Visit Summary for   In-house Referral: Clinical Social Work                                   Social Drivers of Health (SDOH) Interventions SDOH Screenings   Food Insecurity: Patient Unable To Answer (03/23/2024)  Housing: Low Risk  (03/23/2024)  Transportation Needs: Patient Unable To Answer (03/23/2024)  Utilities: Patient Unable To Answer (03/23/2024)  Social Connections: Unknown (03/23/2024)  Tobacco Use: High Risk (03/22/2024)     Readmission Risk Interventions    08/14/2023   12:38 PM 08/12/2023    2:14 PM  Readmission Risk  Prevention Plan  Transportation Screening Complete Complete  PCP or Specialist Appt within 5-7 Days Complete Complete  Home Care Screening Complete Complete  Medication Review (RN CM) Complete Complete

## 2024-03-28 NOTE — Plan of Care (Signed)

## 2024-03-28 NOTE — Progress Notes (Signed)
 DISCHARGE NOTE SNF [facility] to be discharged Skilled nursing facility [employer] per MD order. Patient verbalized understanding.  Skin clean, dry and intact without evidence of skin break down, no evidence of skin tears noted. IV catheter discontinued intact. Site without signs and symptoms of complications. Dressing and pressure applied. Pt denies pain at the site currently. No complaints noted.  Patient free of lines, drains, and wounds.   Discharge packet assembled. An After Visit Summary (AVS) was printed and given to the EMS personnel. Patient escorted via stretcher and discharged to [facility] via ambulance. Report called to accepting facility; all questions and concerns addressed.   Peyton SHAUNNA Pepper, RN

## 2024-03-28 NOTE — Discharge Summary (Addendum)
 Physician Discharge Summary   Patient: Glenda Boone MRN: [id number] DOB: 21-Jun-1950  Admit date:     03/22/2024  Discharge date: 03/28/24  Discharge Physician: Elidia Sieving Christipher Rieger   PCP: Feliciano Devoria LABOR, MD   Recommendations at discharge:    Patient will continue furosemide  40 mg bid, and added spironolactone  for RAAS inhibition, continue metoprolol . If blood pressure stable as outpatient possible addition of Angiotensin receptor blocker/. Continue bronchodilator therapy and inhaled corticosteroids Supplemental 02 to keep 02 saturation 92% or greater.  Continue home non invasive mechanical ventilation for sleep. Follow up renal function and electrolytes in 7 days as outpatient Follow up with Dr Feliciano in 7 to 10 days Follow up with Cardiology and Pulmonary as scheduled.   I left a message on her sister voicemail, not able to reach her at this time.   Discharge Diagnoses: Principal Problem:   COPD (chronic obstructive pulmonary disease) (HCC) Active Problems:   Acute on chronic diastolic CHF (congestive heart failure) (HCC)   Paroxysmal atrial fibrillation (HCC)   Essential hypertension   Chronic kidney disease, stage 3a (HCC)   Type 2 diabetes mellitus with hyperlipidemia (HCC)   History of TIA (transient ischemic attack)   Acute cystitis without hematuria   Hypothyroidism   Depression   Class 3 obesity (HCC)  Resolved Problems:   * No resolved hospital problems. [facility] Course: Mrs. Grissom was admitted to the hospital with the working diagnosis of COPD exacerbation.   73 yo female with the past medical history of hypertension, dyslipidemia, T2DM, heart failure, atrial fibrillation, COPD and obesity who presented with dyspnea.  Apparently patient developed acute onset of rapidly worsening dyspnea at the SNF. EMS was called and she was found in respiratory distress. Her 02 saturation was in the 80's on 6 L/min per [place].  She was given bronchodilator therapy,  steroids and was transported to the ED, where she was placed on Bipap.  On her initial physical examination her blood pressure systolic was 130's, HR 40 to 60, RR 25 and 02 saturation 96% on Bipap.  Her lungs had decreased breath sounds bilaterally, heart with S1 and S2 present, with no gallops or rubs, abdomen with no distention, soft and non tender, positive lower extremity edema ++.   ABG 7.26/ 101/ 68/ 50/ 90%  Na 142, K 4.5 Cl 91 bicarbonate 40 glucose 172 bun 23 cr 1,33 AST 18 ALT 16  Procalcitonin < 0.10  Wbc 8,4 hgb 9.4 plt 257   Sars COVID 19 negative RSV negative Influenza negative  Respiratory viral panel negative   Urine analysis SG 1,006, protein negative, positive nitrates, negative leukocytes and negative hgb  Glucose > 500  Wbc 6-10 and RBC 0-5  Urine culture positive for Klebsiella pneumonia >100,000 CFU  Blood cultures with only one bottle positive for staphylococcus epidermidis.   Chest radiograph with hypoinflation, positive cardiomegaly, with no effusions or infiltrates.   EKG 63 bpm, right axis deviation, normal intervals, qtc 479, sinus rhythm with no significant ST segment or T wave changes.   Patient was placed on bronchodilator therapy and diuresis. Eventually weaned to nasal cannula with good toleration.  Received antibiotic therapy for cystitis.   11/03 clinically stable for transfer to SNF. Continue supplemental 02 per [place] to keep 02 saturation 92% or greater.   Assessment and Plan: * COPD (chronic obstructive pulmonary disease) (HCC) COPD exacerbation  Acute on chronic hypoxemic and hypercapnic respiratory failure  02 saturation is 96% on 5 L/min today Her  dyspnea has been improving Continue to have poor mobility.   Patient had systemic corticosteroids during her hospitalization.  Plan to continue budesonide , umeclidin vilanterol  As needed albuterol .  Continue to work with PT and OT Continue home NIV   Acute on chronic diastolic CHF  (congestive heart failure) (HCC) Echocardiogram with preserved LV systolic function with EF 70 to 75%, moderate LVH, grade II diastolic dysfunction with pseudo normalization, EA, moderate left atrial dilatation, no significant valvular disease.   Patient was placed on IV furosemide  for diuresis, negative fluid balance was achieved, -16.510 ml, with significant improvement in her symptoms.   Continue with oral loop diuretic to keep negative fluid balance.  Metoprolol  and spironolactone .  Poor mobility and urine infection, not candidate at this point for SGLT 2 inh   Paroxysmal atrial fibrillation (HCC) Continue rate control with metoprolol  succinate.  Continue anticoagulation with apixaban .   Essential hypertension Continue blood pressure control with metoprolol  Continue blood pressure monitoring   Chronic kidney disease, stage 3b (HCC) Baseline serum cr 1,3 to 1.4 consistent with CKD stage 3b and not 3a  Volume status has improved.  At the time of her discharge her renal function has a serum cr of 1,34 with K at 4,1 and serum bicarbonate at 40  Na 139 .   Plan to continue with loop diuretic and mineralocorticoid receptor blocker.  Follow up renal function and electrolytes in 7 days as outpatient   Type 2 diabetes mellitus with hyperlipidemia (HCC) Patient was placed on insulin  sliding scale for glucose cover and monitoring, her glucose remained stable.   Continue with linagliptin . Glucose controlled with fasting glucose today at 108 mg/dl   Resume statin therapy    History of TIA (transient ischemic attack) Continue blood pressure control and statin. Continue anticoagulation with apixaban   Acute cystitis without hematuria Patient had 3 days of IV ceftriaxone , with good toleration.  Hold on SGLT 2 inh   Hypothyroidism Continue with levothyroxine    Depression Continue with bupropion  and buspirone    Class 3 obesity (HCC) Calculated BMI is 59.8       Consultants:  none  Procedures performed: none   Disposition: Skilled nursing facility Diet recommendation:  Cardiac and Carb modified diet DISCHARGE MEDICATION: Allergies as of 03/28/2024       Reactions   Shellfish Allergy Hives   Codeine Nausea Only   Demerol [meperidine Hcl] Anxiety   Neurontin  [gabapentin ] Other (See Comments)   Nightmares        Medication List     STOP taking these medications    dapagliflozin  propanediol 10 MG Tabs tablet Commonly known as: FARXIGA    LORazepam  1 MG tablet Commonly known as: ATIVAN    predniSONE  10 MG tablet Commonly known as: DELTASONE    promethazine  12.5 MG suppository Commonly known as: PHENERGAN        TAKE these medications    acetaminophen  325 MG tablet Commonly known as: TYLENOL  Take 650 mg by mouth in the morning and at bedtime. May also given 2 tablets by mouth every 6 hours as needed for pain/fever What changed: Another medication with the same name was removed. Continue taking this medication, and follow the directions you see here.   albuterol  108 (90 Base) MCG/ACT inhaler Commonly known as: VENTOLIN  HFA Inhale 2 puffs into the lungs every 6 (six) hours as needed for wheezing or shortness of breath.   Anoro Ellipta  62.5-25 MCG/ACT Aepb Generic drug: umeclidinium-vilanterol Inhale 1 puff into the lungs daily.   apixaban  5 MG Tabs  tablet Commonly known as: ELIQUIS  Take 1 tablet (5 mg total) by mouth 2 (two) times daily.   atorvastatin  20 MG tablet Commonly known as: LIPITOR Take 1 tablet (20 mg total) by mouth daily. What changed: when to take this   barrier cream Crea Commonly known as: non-specified Apply 1 Application topically See admin instructions. Apply to labia and groin area every day and night shift and apply three times daily with every shift with all incontinent care.   budesonide  0.25 MG/2ML nebulizer solution Commonly known as: PULMICORT  Take 2 mLs (0.25 mg total) by nebulization 2 (two) times daily.    buPROPion  75 MG tablet Commonly known as: WELLBUTRIN  Take 75 mg by mouth daily.   busPIRone  7.5 MG tablet Commonly known as: BUSPAR  Take 7.5 mg by mouth 2 (two) times daily.   cyanocobalamin  1000 MCG tablet Take 1,000 mcg by mouth once a week.   cyclobenzaprine  5 MG tablet Commonly known as: FLEXERIL  Take 5 mg by mouth daily as needed for muscle spasms.   furosemide  40 MG tablet Commonly known as: LASIX  Take 1 tablet (40 mg total) by mouth 2 (two) times daily.   Gemtesa 75 MG Tabs Generic drug: Vibegron Take 75 mg by mouth daily.   hyoscyamine 0.125 MG/5ML Elix Commonly known as: LEVSIN 0.125 mg every 4 (four) hours as needed (for excess secretions). Give under the tongue   levothyroxine  200 MCG tablet Commonly known as: SYNTHROID  Take 200 mcg by mouth daily.   linagliptin  5 MG Tabs tablet Commonly known as: TRADJENTA  Take 5 mg by mouth daily.   metoprolol  succinate 25 MG 24 hr tablet Commonly known as: TOPROL -XL Take 1 tablet (25 mg total) by mouth daily.   MiraLax  17 GM/SCOOP powder Generic drug: polyethylene glycol powder Take 17 g by mouth daily as needed (constipation).   OXYGEN Inhale 6 L/min into the lungs continuous.   pantoprazole  40 MG tablet Commonly known as: PROTONIX  Take 1 tablet (40 mg total) by mouth daily with breakfast.   spironolactone  25 MG tablet Commonly known as: ALDACTONE  Take 0.5 tablets (12.5 mg total) by mouth daily.   tetrahydrozoline 0.05 % ophthalmic solution Place 2 drops into both eyes daily.        Discharge Exam: Filed Weights   03/26/24 0540 03/27/24 0431 03/28/24 0330  Weight: (!) 158.2 kg (!) 157.4 kg (!) 156.9 kg   BP 113/67 (BP Location: Left Wrist)   Pulse 62   Temp 97.9 F (36.6 C) (Oral)   Resp 20   Ht 5' 4 (1.626 m)   Wt (!) 156.9 kg   SpO2 96%   BMI 59.37 kg/m   Neurology patient awake and alert ENT with mild pallor with no icterus Cardiovascular with S1 and S2 present and regular with no  gallops, rubs or murmurs Respiratory with no rales, rhonchi or wheezing, on anterior auscultation abdomen protuberant but not tender or distended Lower extremity with no ankle edema, positive bilateral lymphedema   Condition at discharge: stable  The results of significant diagnostics from this hospitalization (including imaging, microbiology, ancillary and laboratory) are listed below for reference.   Imaging Studies: DG Chest Portable 1 View Result Date: 03/22/2024 CLINICAL DATA:  COPD.  Heart failure. EXAM: PORTABLE CHEST 1 VIEW COMPARISON:  Radiograph earlier today FINDINGS: Unchanged elevation of right hemidiaphragm. Persistent low lung volumes with coarse lung markings. Stable blunting of the costophrenic angles. Query increasing retrocardiac opacity. Patchy left suprahilar patchy. Stable heart size and mediastinal contours. No pneumothorax. IMPRESSION: Persistent  low lung volumes with coarse lung markings. Query increasing retrocardiac opacity, atelectasis versus pneumonia. Patchy left suprahilar opacity. Electronically Signed   By: Andrea Gasman M.D.   On: 03/22/2024 21:44   DG Chest Portable 1 View Result Date: 03/22/2024 EXAM: 1 VIEW(S) XRAY OF THE CHEST 03/22/2024 08:57:00 PM COMPARISON: Chest x-ray 01/12/2024, CT chest 01/12/2024. CLINICAL HISTORY: Encounter for heart failure and COPD. FINDINGS: LUNGS AND PLEURA: Low lung volumes with chronic coarsening interstitial markings. Nonspecific blunting of bilateral costophrenic angles. No focal pulmonary opacity. No pneumothorax. HEART AND MEDIASTINUM: Unchanged cardiomediastinal silhouette. Atherosclerotic plaque. BONES AND SOFT TISSUES: No acute osseous abnormality. IMPRESSION: 1. Low lung volumes.Recommend repeat chest x-ray PA and lateral view with improved inspiratory effort for further evaluation. Electronically signed by: Morgane Naveau MD 03/22/2024 09:03 PM EDT RP Workstation: [id number]    Microbiology: Results for orders placed  or performed during the hospital encounter of 03/22/24  Culture, blood (routine x 2)     Status: Abnormal   Collection Time: 03/22/24  8:04 PM   Specimen: BLOOD  Result Value Ref Range Status   Specimen Description BLOOD RIGHT ANTECUBITAL  Final   Special Requests   Final    BOTTLES DRAWN AEROBIC AND ANAEROBIC Blood Culture adequate volume   Culture  Setup Time   Final    GRAM POSITIVE COCCI ANAEROBIC BOTTLE ONLY CRITICAL RESULT CALLED TO, READ BACK BY AND VERIFIED WITH: PHARMD CANDIE COLLARD [phone] @ [email or web address]    Culture (A)  Final    STAPHYLOCOCCUS EPIDERMIDIS THE SIGNIFICANCE OF ISOLATING THIS ORGANISM FROM A SINGLE SET OF BLOOD CULTURES WHEN MULTIPLE SETS ARE DRAWN IS UNCERTAIN. PLEASE NOTIFY THE MICROBIOLOGY DEPARTMENT WITHIN ONE WEEK IF SPECIATION AND SENSITIVITIES ARE REQUIRED. Performed at [facility], [address] [place], [place] [address]    Report Status 03/25/2024 FINAL  Final  Blood Culture ID Panel (Reflexed)     Status: Abnormal   Collection Time: 03/22/24  8:04 PM  Result Value Ref Range Status   Enterococcus faecalis NOT DETECTED NOT DETECTED Final   Enterococcus Faecium NOT DETECTED NOT DETECTED Final   Listeria monocytogenes NOT DETECTED NOT DETECTED Final   Staphylococcus species DETECTED (A) NOT DETECTED Final    Comment: CRITICAL RESULT CALLED TO, READ BACK BY AND VERIFIED WITH: MAYA CANDIE COLLARD [phone] @ [email or web address]    Staphylococcus aureus (BCID) NOT DETECTED NOT DETECTED Final   Staphylococcus epidermidis DETECTED (A) NOT DETECTED Final    Comment: Methicillin (oxacillin) resistant coagulase negative staphylococcus. Possible blood culture contaminant (unless isolated from more than one blood culture draw or clinical case suggests pathogenicity). No antibiotic treatment is indicated for blood  culture contaminants. CRITICAL RESULT CALLED TO, READ BACK BY AND VERIFIED WITH: PHARMD CANDIE COLLARD [phone] @ [phone] [facility]    Staphylococcus lugdunensis NOT DETECTED NOT DETECTED  Final   Streptococcus species NOT DETECTED NOT DETECTED Final   Streptococcus agalactiae NOT DETECTED NOT DETECTED Final   Streptococcus pneumoniae NOT DETECTED NOT DETECTED Final   Streptococcus pyogenes NOT DETECTED NOT DETECTED Final   A.calcoaceticus-baumannii NOT DETECTED NOT DETECTED Final   Bacteroides fragilis NOT DETECTED NOT DETECTED Final   Enterobacterales NOT DETECTED NOT DETECTED Final   Enterobacter cloacae complex NOT DETECTED NOT DETECTED Final   Escherichia coli NOT DETECTED NOT DETECTED Final   Klebsiella aerogenes NOT DETECTED NOT DETECTED Final   Klebsiella oxytoca NOT DETECTED NOT DETECTED Final   Klebsiella pneumoniae NOT DETECTED NOT DETECTED Final   Proteus species NOT DETECTED NOT  DETECTED Final   Salmonella species NOT DETECTED NOT DETECTED Final   Serratia marcescens NOT DETECTED NOT DETECTED Final   Haemophilus influenzae NOT DETECTED NOT DETECTED Final   Neisseria meningitidis NOT DETECTED NOT DETECTED Final   Pseudomonas aeruginosa NOT DETECTED NOT DETECTED Final   Stenotrophomonas maltophilia NOT DETECTED NOT DETECTED Final   Candida albicans NOT DETECTED NOT DETECTED Final   Candida auris NOT DETECTED NOT DETECTED Final   Candida glabrata NOT DETECTED NOT DETECTED Final   Candida krusei NOT DETECTED NOT DETECTED Final   Candida parapsilosis NOT DETECTED NOT DETECTED Final   Candida tropicalis NOT DETECTED NOT DETECTED Final   Cryptococcus neoformans/gattii NOT DETECTED NOT DETECTED Final   Methicillin resistance mecA/C DETECTED (A) NOT DETECTED Final    Comment: CRITICAL RESULT CALLED TO, READ BACK BY AND VERIFIED WITH: MAYA CANDIE COLLARD [phone] @ [email or web address] Performed at [facility], [address], [place], [place] [address]   Resp panel by RT-PCR (RSV, Flu A&B, Covid)     Status: None   Collection Time: 03/22/24  8:09 PM   Specimen: Nasal Swab  Result Value Ref Range Status   SARS Coronavirus 2 by RT PCR NEGATIVE NEGATIVE Final   Influenza A by  PCR NEGATIVE NEGATIVE Final   Influenza B by PCR NEGATIVE NEGATIVE Final    Comment: (NOTE) The Xpert Xpress SARS-CoV-2/FLU/RSV plus assay is intended as an aid in the diagnosis of influenza from Nasopharyngeal swab specimens and should not be used as a sole basis for treatment. Nasal washings and aspirates are unacceptable for Xpert Xpress SARS-CoV-2/FLU/RSV testing.  Fact Sheet for Patients: bloggercourse.com  Fact Sheet for Healthcare Providers: seriousbroker.it  This test is not yet approved or cleared by the [place]  FDA and has been authorized for detection and/or diagnosis of SARS-CoV-2 by FDA under an Emergency Use Authorization (EUA). This EUA will remain in effect (meaning this test can be used) for the duration of the COVID-19 declaration under Section 564(b)(1) of the Act, 21 U.S.C. section 360bbb-3(b)(1), unless the authorization is terminated or revoked.     Resp Syncytial Virus by PCR NEGATIVE NEGATIVE Final    Comment: (NOTE) Fact Sheet for Patients: bloggercourse.com  Fact Sheet for Healthcare Providers: seriousbroker.it  This test is not yet approved or cleared by the [place]  FDA and has been authorized for detection and/or diagnosis of SARS-CoV-2 by FDA under an Emergency Use Authorization (EUA). This EUA will remain in effect (meaning this test can be used) for the duration of the COVID-19 declaration under Section 564(b)(1) of the Act, 21 U.S.C. section 360bbb-3(b)(1), unless the authorization is terminated or revoked.  Performed at [facility], [address] [place], [place] [address]   Culture, blood (routine x 2)     Status: None   Collection Time: 03/22/24  8:45 PM   Specimen: BLOOD  Result Value Ref Range Status   Specimen Description BLOOD SITE NOT SPECIFIED  Final   Special Requests   Final    BOTTLES DRAWN AEROBIC ONLY  Blood Culture adequate volume   Culture   Final    NO GROWTH 5 DAYS Performed at [facility], [address], [place], [place] [address]    Report Status 03/27/2024 FINAL  Final  Respiratory (~20 pathogens) panel by PCR     Status: None   Collection Time: 03/22/24 10:54 PM   Specimen: Nasopharyngeal Swab; Respiratory  Result Value Ref Range Status   Adenovirus NOT DETECTED NOT  DETECTED Final   Coronavirus 229E NOT DETECTED NOT DETECTED Final    Comment: (NOTE) The Coronavirus on the Respiratory Panel, DOES NOT test for the novel  Coronavirus (2019 nCoV)    Coronavirus HKU1 NOT DETECTED NOT DETECTED Final   Coronavirus NL63 NOT DETECTED NOT DETECTED Final   Coronavirus OC43 NOT DETECTED NOT DETECTED Final   Metapneumovirus NOT DETECTED NOT DETECTED Final   Rhinovirus / Enterovirus NOT DETECTED NOT DETECTED Final   Influenza A NOT DETECTED NOT DETECTED Final   Influenza B NOT DETECTED NOT DETECTED Final   Parainfluenza Virus 1 NOT DETECTED NOT DETECTED Final   Parainfluenza Virus 2 NOT DETECTED NOT DETECTED Final   Parainfluenza Virus 3 NOT DETECTED NOT DETECTED Final   Parainfluenza Virus 4 NOT DETECTED NOT DETECTED Final   Respiratory Syncytial Virus NOT DETECTED NOT DETECTED Final   Bordetella pertussis NOT DETECTED NOT DETECTED Final   Bordetella Parapertussis NOT DETECTED NOT DETECTED Final   Chlamydophila pneumoniae NOT DETECTED NOT DETECTED Final   Mycoplasma pneumoniae NOT DETECTED NOT DETECTED Final    Comment: Performed at [facility], [address] [place], [place] [address]  Urine Culture (for pregnant, neutropenic or urologic patients or patients with an indwelling urinary catheter)     Status: Abnormal   Collection Time: 03/23/24  1:52 AM   Specimen: Urine, Clean Catch  Result Value Ref Range Status   Specimen Description URINE, CLEAN CATCH  Final   Special Requests   Final    NONE Performed at [facility], [address] [place],  [place] [address]    Culture >=100,000 COLONIES/mL KLEBSIELLA PNEUMONIAE (A)  Final   Report Status 03/25/2024 FINAL  Final   Organism ID, Bacteria KLEBSIELLA PNEUMONIAE (A)  Final      Susceptibility   Klebsiella pneumoniae - MIC*    AMPICILLIN  >=32 RESISTANT Resistant     CEFAZOLIN (URINE) Value in next row Sensitive      2 SENSITIVEThis is a modified FDA-approved test that has been validated and its performance characteristics determined by the reporting laboratory.  This laboratory is certified under the Clinical Laboratory Improvement Amendments CLIA as qualified to perform high complexity clinical laboratory testing.    CEFEPIME Value in next row Sensitive      2 SENSITIVEThis is a modified FDA-approved test that has been validated and its performance characteristics determined by the reporting laboratory.  This laboratory is certified under the Clinical Laboratory Improvement Amendments CLIA as qualified to perform high complexity clinical laboratory testing.    ERTAPENEM Value in next row Sensitive      2 SENSITIVEThis is a modified FDA-approved test that has been validated and its performance characteristics determined by the reporting laboratory.  This laboratory is certified under the Clinical Laboratory Improvement Amendments CLIA as qualified to perform high complexity clinical laboratory testing.    CEFTRIAXONE  Value in next row Sensitive      2 SENSITIVEThis is a modified FDA-approved test that has been validated and its performance characteristics determined by the reporting laboratory.  This laboratory is certified under the Clinical Laboratory Improvement Amendments CLIA as qualified to perform high complexity clinical laboratory testing.    CIPROFLOXACIN Value in next row Sensitive      2 SENSITIVEThis is a modified FDA-approved test that has been validated and its performance characteristics determined by the reporting laboratory.  This laboratory is certified under the Clinical  Laboratory Improvement Amendments CLIA as qualified to perform high complexity clinical laboratory  testing.    GENTAMICIN Value in next row Sensitive      2 SENSITIVEThis is a modified FDA-approved test that has been validated and its performance characteristics determined by the reporting laboratory.  This laboratory is certified under the Clinical Laboratory Improvement Amendments CLIA as qualified to perform high complexity clinical laboratory testing.    NITROFURANTOIN Value in next row Resistant      2 SENSITIVEThis is a modified FDA-approved test that has been validated and its performance characteristics determined by the reporting laboratory.  This laboratory is certified under the Clinical Laboratory Improvement Amendments CLIA as qualified to perform high complexity clinical laboratory testing.    TRIMETH /SULFA  Value in next row Sensitive      2 SENSITIVEThis is a modified FDA-approved test that has been validated and its performance characteristics determined by the reporting laboratory.  This laboratory is certified under the Clinical Laboratory Improvement Amendments CLIA as qualified to perform high complexity clinical laboratory testing.    AMPICILLIN /SULBACTAM Value in next row Sensitive      2 SENSITIVEThis is a modified FDA-approved test that has been validated and its performance characteristics determined by the reporting laboratory.  This laboratory is certified under the Clinical Laboratory Improvement Amendments CLIA as qualified to perform high complexity clinical laboratory testing.    PIP/TAZO Value in next row Sensitive      <=4 SENSITIVEThis is a modified FDA-approved test that has been validated and its performance characteristics determined by the reporting laboratory.  This laboratory is certified under the Clinical Laboratory Improvement Amendments CLIA as qualified to perform high complexity clinical laboratory testing.    MEROPENEM Value in next row Sensitive      <=4  SENSITIVEThis is a modified FDA-approved test that has been validated and its performance characteristics determined by the reporting laboratory.  This laboratory is certified under the Clinical Laboratory Improvement Amendments CLIA as qualified to perform high complexity clinical laboratory testing.    * >=100,000 COLONIES/mL KLEBSIELLA PNEUMONIAE    Labs: CBC: Recent Labs  Lab 03/22/24 2004 03/22/24 2013 03/23/24 0326 03/23/24 0536 03/24/24 1226 03/25/24 0441 03/26/24 0426  WBC 10.1  --  8.4  --  7.2 7.3 7.5  NEUTROABS 8.1*  --   --   --   --  4.9 5.2  HGB 10.3*   < > 9.4* 10.5* 10.1* 8.7* 9.4*  HCT 36.4   < > 33.0* 31.0* 33.9* 29.2* 31.0*  MCV 106.7*  --  107.5*  --  101.8* 101.7* 100.6*  PLT 283  --  257  --  259 270 265   < > = values in this interval not displayed.   Basic Metabolic Panel: Recent Labs  Lab 03/23/24 0326 03/23/24 0536 03/24/24 1226 03/25/24 0441 03/26/24 0426 03/27/24 0429  NA 141 141 139 139 140 139  K 4.4 4.2 4.5 3.7 3.6 4.1  CL 88*  --  87* 86* 85* 87*  CO2 40*  --  38* 42* 41* 40*  GLUCOSE 191*  --  188* 104* 126* 108*  BUN 25*  --  29* 32* 35* 36*  CREATININE 1.24*  --  1.44* 1.37* 1.23* 1.34*  CALCIUM  8.6*  --  8.4* 8.3* 8.6* 8.6*  MG 2.2  --   --   --   --   --    Liver Function Tests: Recent Labs  Lab 03/22/24 2343 03/24/24 1226 03/25/24 0441 03/26/24 0426  AST 18 33 25 33  ALT 16 18 17 23   ALKPHOS  72 65 54 53  BILITOT 0.9 0.5 0.5 0.5  PROT 7.2 6.8 6.1* 6.2*  ALBUMIN 2.9* 3.1* 2.7* 2.8*   CBG: Recent Labs  Lab 03/27/24 0746 03/27/24 1136 03/27/24 1609 03/27/24 2041 03/28/24 0807  GLUCAP 141* 143* 167* 168* 134*    Discharge time spent: greater than 30 minutes.  Signed: Elidia Toribio Furnace, MD [facility] 03/28/2024

## 2024-03-28 NOTE — Plan of Care (Signed)

## 2024-12-09 ENCOUNTER — Ambulatory Visit
